# Patient Record
Sex: Male | Born: 1950 | Race: Black or African American | Hispanic: No | Smoking: Never smoker
Health system: Southern US, Community
[De-identification: ages and names within clinical notes are randomized; demographics above are authoritative.]

## PROBLEM LIST (undated history)

## (undated) DIAGNOSIS — I82403 Acute embolism and thrombosis of unspecified deep veins of lower extremity, bilateral: Secondary | ICD-10-CM

## (undated) DIAGNOSIS — E872 Acidosis, unspecified: Secondary | ICD-10-CM

## (undated) DIAGNOSIS — J9600 Acute respiratory failure, unspecified whether with hypoxia or hypercapnia: Secondary | ICD-10-CM

## (undated) DIAGNOSIS — E870 Hyperosmolality and hypernatremia: Secondary | ICD-10-CM

## (undated) DIAGNOSIS — I739 Peripheral vascular disease, unspecified: Secondary | ICD-10-CM

## (undated) DIAGNOSIS — E785 Hyperlipidemia, unspecified: Secondary | ICD-10-CM

## (undated) DIAGNOSIS — I959 Hypotension, unspecified: Secondary | ICD-10-CM

## (undated) HISTORY — DX: Acidosis: E87.2

## (undated) HISTORY — DX: Hypotension, unspecified: I95.9

## (undated) HISTORY — DX: Acute embolism and thrombosis of unspecified deep veins of lower extremity, bilateral: I82.403

## (undated) HISTORY — DX: Acidosis, unspecified: E87.20

## (undated) HISTORY — DX: Hyperlipidemia, unspecified: E78.5

## (undated) HISTORY — DX: Hyperosmolality and hypernatremia: E87.0

## (undated) HISTORY — DX: Acute respiratory failure, unspecified whether with hypoxia or hypercapnia: J96.00

## (undated) HISTORY — DX: Peripheral vascular disease, unspecified: I73.9

---

## 2002-03-12 ENCOUNTER — Emergency Department (HOSPITAL_COMMUNITY): Admission: EM | Admit: 2002-03-12 | Discharge: 2002-03-12 | Payer: Self-pay | Admitting: Emergency Medicine

## 2002-03-14 ENCOUNTER — Encounter: Payer: Self-pay | Admitting: Emergency Medicine

## 2002-03-14 ENCOUNTER — Emergency Department (HOSPITAL_COMMUNITY): Admission: EM | Admit: 2002-03-14 | Discharge: 2002-03-14 | Payer: Self-pay | Admitting: Emergency Medicine

## 2015-07-08 ENCOUNTER — Emergency Department (HOSPITAL_COMMUNITY): Payer: Self-pay

## 2015-07-08 ENCOUNTER — Emergency Department (HOSPITAL_COMMUNITY)
Admission: EM | Admit: 2015-07-08 | Discharge: 2015-07-08 | Disposition: A | Discharge status: Home / Self Care | Payer: Self-pay | Attending: Emergency Medicine | Admitting: Emergency Medicine

## 2015-07-08 ENCOUNTER — Encounter (HOSPITAL_COMMUNITY): Payer: Self-pay | Admitting: Neurology

## 2015-07-08 DIAGNOSIS — R000 Tachycardia, unspecified: Secondary | ICD-10-CM | POA: Insufficient documentation

## 2015-07-08 DIAGNOSIS — Z23 Encounter for immunization: Secondary | ICD-10-CM | POA: Insufficient documentation

## 2015-07-08 DIAGNOSIS — Y9389 Activity, other specified: Secondary | ICD-10-CM | POA: Insufficient documentation

## 2015-07-08 DIAGNOSIS — Y9289 Other specified places as the place of occurrence of the external cause: Secondary | ICD-10-CM | POA: Insufficient documentation

## 2015-07-08 DIAGNOSIS — Y998 Other external cause status: Secondary | ICD-10-CM | POA: Insufficient documentation

## 2015-07-08 DIAGNOSIS — I10 Essential (primary) hypertension: Secondary | ICD-10-CM | POA: Insufficient documentation

## 2015-07-08 DIAGNOSIS — Z043 Encounter for examination and observation following other accident: Secondary | ICD-10-CM | POA: Insufficient documentation

## 2015-07-08 DIAGNOSIS — W010XXA Fall on same level from slipping, tripping and stumbling without subsequent striking against object, initial encounter: Secondary | ICD-10-CM | POA: Insufficient documentation

## 2015-07-08 DIAGNOSIS — W19XXXA Unspecified fall, initial encounter: Secondary | ICD-10-CM

## 2015-07-08 MED ORDER — TETANUS-DIPHTH-ACELL PERTUSSIS 5-2.5-18.5 LF-MCG/0.5 IM SUSP
0.5000 mL | Freq: Once | INTRAMUSCULAR | Status: AC
  Administered 2015-07-08: 0.5 mL via INTRAMUSCULAR
  Filled 2015-07-08: qty 0.5

## 2015-07-08 MED ORDER — SODIUM CHLORIDE 0.9 % IV BOLUS (SEPSIS)
1000.0000 mL | Freq: Once | INTRAVENOUS | Status: AC
  Administered 2015-07-08: 1000 mL via INTRAVENOUS

## 2015-07-08 NOTE — ED Notes (Signed)
Per ems- [place] his [place] and fell hitting his head, no LOC. EMS assisted pt up, but he was unable to walk. Initial BP 148/114, pt is a x 4. Denies any pain. Recent BP 180/120, CBG 94, HR 113.

## 2015-07-08 NOTE — ED Notes (Signed)
No sign of adverse reaction to vaccination, pt dc'd

## 2015-07-08 NOTE — ED Provider Notes (Signed)
CSN: [id number]     Arrival date & time 07/08/15  0902 History   First MD Initiated Contact with Patient 07/08/15 [phone]     Chief Complaint  Patient presents with  . Fall  . Hypertension     (Consider location/radiation/quality/duration/timing/severity/associated sxs/prior Treatment) Patient is a 65 y.o. male presenting with fall and hypertension. The history is provided by the patient.  Fall This is a new problem. The current episode started 1 to 2 hours ago. The problem occurs rarely. The problem has not changed since onset.Pertinent negatives include no chest pain, no abdominal pain, no headaches and no shortness of breath. Nothing aggravates the symptoms. Nothing relieves the symptoms. He has tried nothing for the symptoms. The treatment provided no relief.  Hypertension Pertinent negatives include no chest pain, no abdominal pain, no headaches and no shortness of breath.   65 yo M With a chief complaint of a fall. Patient states he was at work and lost his balance and tripped over his feet and landed on his head. Denies loss of consciousness denies neck pain chest pain abdominal pain. Patient is currently asymptomatic. Denies chest pain or shortness breath prior to the event denies headache prior to the event. Last tetanus is unknown.  History reviewed. No pertinent past medical history. History reviewed. No pertinent past surgical history. No family history on file. Social History  Substance Use Topics  . Smoking status: Never Smoker   . Smokeless tobacco: None  . Alcohol Use: No    Review of Systems  Constitutional: Negative for fever and chills.  HENT: Negative for congestion and facial swelling.   Eyes: Negative for discharge and visual disturbance.  Respiratory: Negative for shortness of breath.   Cardiovascular: Negative for chest pain and palpitations.  Gastrointestinal: Negative for vomiting, abdominal pain and diarrhea.  Musculoskeletal: Negative for myalgias and  arthralgias.  Skin: Positive for wound. Negative for color change and rash.  Neurological: Negative for tremors, syncope and headaches.  Psychiatric/Behavioral: Negative for confusion and dysphoric mood.      Allergies  Review of patient's allergies indicates no known allergies.  Home Medications   Prior to Admission medications   Not on File   BP 169/90 mmHg  Pulse 89  Temp(Src) 99.6 F (37.6 C) (Oral)  Resp 20  SpO2 96% Physical Exam  Constitutional: He is oriented to person, place, and time. He appears well-developed and well-nourished.  HENT:  Head: Normocephalic and atraumatic.  Eyes: EOM are normal. Pupils are equal, round, and reactive to light.  Neck: Normal range of motion. Neck supple. No JVD present.  Cardiovascular: Regular rhythm.  Tachycardia present.  Exam reveals no gallop and no friction rub.   No murmur heard. Pulmonary/Chest: No respiratory distress. He has no wheezes.  Abdominal: He exhibits no distension. There is no tenderness. There is no rebound and no guarding.  Musculoskeletal: Normal range of motion.  Neurological: He is alert and oriented to person, place, and time.  Skin: No rash noted. No pallor.  Psychiatric: He has a normal mood and affect. His behavior is normal.  Nursing note and vitals reviewed.   ED Course  Procedures (including critical care time) Labs Review Labs Reviewed - No data to display  Imaging Review Dg Chest 2 View  07/08/2015  CLINICAL DATA:  Tachypnea for 1 day EXAM: CHEST  2 VIEW COMPARISON:  None. FINDINGS: The lungs are clear. Heart size and pulmonary vascularity are normal. No adenopathy. No bone lesions. IMPRESSION: No edema or consolidation.  Electronically Signed   By: Bretta Bang III M.D.   On: 07/08/2015 10:12   Ct Head Wo Contrast  07/08/2015  CLINICAL DATA:  Status post fall. No headache or headache complaints. No neck pain. EXAM: CT HEAD WITHOUT CONTRAST CT CERVICAL SPINE WITHOUT CONTRAST TECHNIQUE:  Multidetector CT imaging of the head and cervical spine was performed following the standard protocol without intravenous contrast. Multiplanar CT image reconstructions of the cervical spine were also generated. COMPARISON:  None. FINDINGS: CT HEAD FINDINGS There is no evidence of mass effect, midline shift or extra-axial fluid collections. There is no evidence of a space-occupying lesion or intracranial hemorrhage. There is no evidence of a cortical-based area of acute infarction. The ventricles and sulci are appropriate for the patient's age. The basal cisterns are patent. Visualized portions of the orbits are unremarkable. The visualized portions of the paranasal sinuses and mastoid air cells are unremarkable. The osseous structures are unremarkable. CT CERVICAL SPINE FINDINGS The alignment is anatomic. The vertebral body heights are maintained. There is no acute fracture. There is no static listhesis. The prevertebral soft tissues are normal. The intraspinal soft tissues are not fully imaged on this examination due to poor soft tissue contrast, but there is no gross soft tissue abnormality. There is severe disc disease at C4-5 and C5-6. There is osseous fusion across the C2-3 disc space and posterior elements. There is a broad-based disc osteophyte complex at C4-5 with bilateral uncovertebral degenerative changes and bilateral foraminal stenosis. 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 There is bilateral uncovertebral degenerative change at C5-6 with foraminal narrowing. There is osseous fusion across the C6-7 disc space. There is uncovertebral degenerative change at C6-7 with bilateral foraminal narrowing. There is severe bilateral facet arthropathy at C7-T1. There is bilateral foraminal narrowing at C7-T1. The visualized portions of the lung apices demonstrate no focal abnormality. IMPRESSION: 1. No acute intracranial abnormality. 2. No acute  osseous injury of the cervical spine. Electronically Signed   By: Elige Ko   On: 07/08/2015 10:44   Ct Cervical Spine Wo Contrast  07/08/2015  CLINICAL DATA:  Status post fall. No headache or headache complaints. No neck pain. EXAM: CT HEAD WITHOUT CONTRAST CT CERVICAL SPINE WITHOUT CONTRAST TECHNIQUE: Multidetector CT imaging of the head and cervical spine was performed following the standard protocol without intravenous contrast. Multiplanar CT image reconstructions of the cervical spine were also generated. COMPARISON:  None. FINDINGS: CT HEAD FINDINGS There is no evidence of mass effect, midline shift or extra-axial fluid collections. There is no evidence of a space-occupying lesion or intracranial hemorrhage. There is no evidence of a cortical-based area of acute infarction. The ventricles and sulci are appropriate for the patient's age. The basal cisterns are patent. Visualized portions of the orbits are unremarkable. The visualized portions of the paranasal sinuses and mastoid air cells are unremarkable. The osseous structures are unremarkable. CT CERVICAL SPINE FINDINGS The alignment is anatomic. The vertebral body heights are maintained. There is no acute fracture. There is no static listhesis. The prevertebral soft tissues are normal. The intraspinal soft tissues are not fully imaged on this examination due to poor soft tissue contrast, but there is no gross soft tissue abnormality. There is severe disc disease at C4-5 and C5-6. There is osseous fusion across the C2-3 disc space and posterior elements. There is a broad-based disc osteophyte complex at C4-5 with bilateral uncovertebral degenerative changes and bilateral foraminal stenosis. There is ossification of  the posterior longitudinal ligament at C5-6 resulting in central canal stenosis. There is bilateral uncovertebral degenerative change at C5-6 with foraminal narrowing. There is osseous fusion across the C6-7 disc space. There is  uncovertebral degenerative change at C6-7 with bilateral foraminal narrowing. There is severe bilateral facet arthropathy at C7-T1. There is bilateral foraminal narrowing at C7-T1. The visualized portions of the lung apices demonstrate no focal abnormality. IMPRESSION: 1. No acute intracranial abnormality. 2. No acute osseous injury of the cervical spine. Electronically Signed   By: Elige KoHetal  Patel   On: 07/08/2015 10:44   I have personally reviewed and evaluated these images and lab results as part of my medical decision-making.   EKG Interpretation   Date/Time:  Friday July 08 2015 09:05:06 EDT Ventricular Rate:  103 PR Interval:  155 QRS Duration: 102 QT Interval:  354 QTC Calculation: 463 R Axis:   41 Text Interpretation:  Sinus tachycardia RSR' in V1 or V2, right VCD or RVH  No old tracing to compare Confirmed by Kell Ferris MD, DANIEL [phone]([id number]) on  07/08/2015 9:27:10 AM      MDM   Final diagnoses:  Fall, initial encounter    65 yo M with a cc of a fall.  Mechanical.  ECG without wpw, brugada or prolonged qt.  Patient asymptomatic, ct head negative, tdap updated.  Initially tachycardic and tachypnic, patient felt a bit anxious.  Improved during his stay with minimal fluids, feel no further workup needed.   2:18 PM:  I have discussed the diagnosis/risks/treatment options with the patient and believe the pt to be eligible for discharge home to follow-up with PCP. We also discussed returning to the ED immediately if new or worsening sx occur. We discussed the sx which are most concerning (e.g., sudden worsening pain, fever, inability to tolerate by mouth) that necessitate immediate return. Medications administered to the patient during their visit and any new prescriptions provided to the patient are listed below.  Medications given during this visit Medications  sodium chloride 0.9 % bolus 1,000 mL (0 mLs Intravenous Stopped 07/08/15 1123)  Tdap (BOOSTRIX) injection 0.5 mL (0.5 mLs  Intramuscular Given 07/08/15 1123)    There are no discharge medications for this patient.   The patient appears reasonably screen and/or stabilized for discharge and I doubt any other medical condition or other [facility] requiring further screening, evaluation, or treatment in the ED at this time prior to discharge.      Melene Planan Maysen Bonsignore, DO 07/08/15 1418

## 2017-03-25 ENCOUNTER — Encounter (HOSPITAL_COMMUNITY): Payer: Self-pay

## 2017-03-25 ENCOUNTER — Other Ambulatory Visit: Payer: Self-pay

## 2017-03-25 ENCOUNTER — Inpatient Hospital Stay (HOSPITAL_COMMUNITY): Payer: Medicare Other

## 2017-03-25 ENCOUNTER — Observation Stay (HOSPITAL_COMMUNITY)
Admission: EM | Admit: 2017-03-25 | Discharge: 2017-03-26 | Disposition: A | Discharge status: Home / Self Care | Payer: Medicare Other | Attending: Internal Medicine | Admitting: Internal Medicine

## 2017-03-25 DIAGNOSIS — D72829 Elevated white blood cell count, unspecified: Secondary | ICD-10-CM | POA: Insufficient documentation

## 2017-03-25 DIAGNOSIS — N179 Acute kidney failure, unspecified: Principal | ICD-10-CM | POA: Diagnosis present

## 2017-03-25 DIAGNOSIS — N139 Obstructive and reflux uropathy, unspecified: Secondary | ICD-10-CM

## 2017-03-25 DIAGNOSIS — N401 Enlarged prostate with lower urinary tract symptoms: Secondary | ICD-10-CM

## 2017-03-25 DIAGNOSIS — K5900 Constipation, unspecified: Secondary | ICD-10-CM | POA: Insufficient documentation

## 2017-03-25 DIAGNOSIS — R338 Other retention of urine: Secondary | ICD-10-CM

## 2017-03-25 LAB — URINALYSIS, ROUTINE W REFLEX MICROSCOPIC
BILIRUBIN URINE: NEGATIVE
GLUCOSE, UA: NEGATIVE mg/dL
KETONES UR: 5 mg/dL — AB
LEUKOCYTES UA: NEGATIVE
NITRITE: NEGATIVE
PH: 5 (ref 5.0–8.0)
Protein, ur: 100 mg/dL — AB
Specific Gravity, Urine: 1.013 (ref 1.005–1.030)

## 2017-03-25 LAB — COMPREHENSIVE METABOLIC PANEL
ALT: 20 U/L (ref 17–63)
ANION GAP: 13 (ref 5–15)
AST: 32 U/L (ref 15–41)
Albumin: 4.1 g/dL (ref 3.5–5.0)
Alkaline Phosphatase: 73 U/L (ref 38–126)
BILIRUBIN TOTAL: 1.2 mg/dL (ref 0.3–1.2)
BUN: 38 mg/dL — ABNORMAL HIGH (ref 6–20)
CALCIUM: 9.2 mg/dL (ref 8.9–10.3)
CO2: 22 mmol/L (ref 22–32)
Chloride: 103 mmol/L (ref 101–111)
Creatinine, Ser: 3.45 mg/dL — ABNORMAL HIGH (ref 0.61–1.24)
GFR calc non Af Amer: 17 mL/min — ABNORMAL LOW (ref >60)
GFR, EST AFRICAN AMERICAN: 20 mL/min — AB (ref >60)
Glucose, Bld: 123 mg/dL — ABNORMAL HIGH (ref 65–99)
POTASSIUM: 3.7 mmol/L (ref 3.5–5.1)
SODIUM: 138 mmol/L (ref 135–145)
TOTAL PROTEIN: 7.8 g/dL (ref 6.5–8.1)

## 2017-03-25 LAB — CBC
HEMATOCRIT: 42.2 % (ref 39.0–52.0)
HEMOGLOBIN: 14.8 g/dL (ref 13.0–17.0)
MCH: 30.5 pg (ref 26.0–34.0)
MCHC: 35.1 g/dL (ref 30.0–36.0)
MCV: 87 fL (ref 78.0–100.0)
Platelets: 219 10*3/uL (ref 150–400)
RBC: 4.85 MIL/uL (ref 4.22–5.81)
RDW: 12.7 % (ref 11.5–15.5)
WBC: 15.7 10*3/uL — ABNORMAL HIGH (ref 4.0–10.5)

## 2017-03-25 LAB — LIPASE, BLOOD: Lipase: 22 U/L (ref 11–51)

## 2017-03-25 MED ORDER — POLYETHYLENE GLYCOL 3350 17 G PO PACK
17.0000 g | PACK | Freq: Every day | ORAL | Status: DC
  Administered 2017-03-25: 17 g via ORAL
  Filled 2017-03-25 (×2): qty 1

## 2017-03-25 MED ORDER — SODIUM CHLORIDE 0.9 % IV BOLUS (SEPSIS)
1000.0000 mL | Freq: Once | INTRAVENOUS | Status: AC
  Administered 2017-03-25: 1000 mL via INTRAVENOUS

## 2017-03-25 MED ORDER — ASPIRIN 81 MG PO CHEW: 81.0000 mg | CHEWABLE_TABLET | Freq: Every day | ORAL | Status: DC | PRN

## 2017-03-25 MED ORDER — SODIUM CHLORIDE 0.9 % IV SOLN
INTRAVENOUS | Status: DC
  Administered 2017-03-25 – 2017-03-26 (×2): via INTRAVENOUS

## 2017-03-25 MED ORDER — ONDANSETRON 4 MG PO TBDP
4.0000 mg | ORAL_TABLET | Freq: Once | ORAL | Status: AC | PRN
  Administered 2017-03-25: 4 mg via ORAL
  Filled 2017-03-25: qty 1

## 2017-03-25 NOTE — ED Triage Notes (Signed)
Per EMS- Patient has been c/o lower abdominal pain and vomiting x 4 days. Patient has not had a BM in 3 days.

## 2017-03-25 NOTE — ED Notes (Signed)
Patient given sprite.

## 2017-03-25 NOTE — ED Notes (Signed)
Provided patient a [place]key sandwich and a coke.

## 2017-03-25 NOTE — ED Triage Notes (Signed)
Patient also added that he has been voiding small amounts frequently x 3 days.

## 2017-03-25 NOTE — ED Notes (Signed)
Bed: [facility]2 Expected date:  Expected time:  Means of arrival:  Comments: 

## 2017-03-25 NOTE — ED Notes (Signed)
Attempted to call report, nurse is getting shift change report at this time.

## 2017-03-25 NOTE — ED Notes (Signed)
US at bedside

## 2017-03-25 NOTE — H&P (Addendum)
History and Physical    Joseph FloridaLarry E Bouchillon ZOX:[id number] DOB: 09/15/1950 DOA: 03/25/2017  Referring MD/NP/PA: Dr. Rhunette CroftNanavati  PCP: Patient, No Pcp Per   Patient coming from: home  Chief Complaint: abdominal pain, vomiting, decrease in urination  HPI: Joseph Downs is a 67 y.o. male who is relatively healthy at baseline, takes no medications, presents to emergency department with main concern of sudden onset of lower quadrants abdominal pain, mostly throbbing and dull, 5/10 in severity, non radiating, associated with nausea and poor oral intake, and several episodes of non bloody vomiting. Patient also reports being constipated and last bowel movement about 3 days prior to this admission. Patient notices that he was not urinating as much and when he urinated only small amounts came out. Patient denies fevers and chills, no similar events in the past, no sick contacts or exposures, no changes in medication regimen. Patient reports he takes no supplements over-the-counter.  ED Course: in emergency department, patient is hemodynamically stable, vital signs stable, blood work notable for WBC 15.7, creatinine 3.45. Bladder scan notable for over thousand cc in bladder, Foley catheter placed, patient reported immediate improvement in symptoms. [facility] asked to admit for further evaluation  Review of Systems:  Constitutional: Negative for fever, chills, diaphoresis, activity change, appetite change and fatigue.  HENT: Negative for ear pain, nosebleeds, congestion, facial swelling, rhinorrhea, neck pain, neck stiffness and ear discharge.   Eyes: Negative for pain, discharge, redness, itching and visual disturbance.  Respiratory: Negative for cough, choking, chest tightness, shortness of breath, wheezing and stridor.   Cardiovascular: Negative for chest pain, palpitations and leg swelling.  Gastrointestinal: Negative for abdominal distention.  Genitourinary: Negative for dysuria, hematuria, flank  pain Musculoskeletal: Negative for back pain, joint swelling, arthralgias and gait problem.  Neurological: Negative for dizziness, tremors, seizures, syncope, facial asymmetry, speech difficulty, weakness, light-headedness, numbness and headaches.  Hematological: Negative for adenopathy. Does not bruise/bleed easily.  Psychiatric/Behavioral: Negative for hallucinations, behavioral problems, confusion, dysphoric mood, decreased concentration and agitation.   Past medical history: No hypertension, no heart disease  Social history:  reports that  has never smoked. he has never used smokeless tobacco. He reports that he does not drink alcohol or use drugs.  No Known Allergies  Family History  Problem Relation Age of Onset  . Cancer Father     Prior to Admission medications   Medication Sig Start Date End Date Taking? Authorizing Provider  aspirin 81 MG chewable tablet Chew 81 mg by mouth daily as needed for mild pain.   Yes [provider]    Physical Exam: Vitals:   03/25/17 0733 03/25/17 0736 03/25/17 0737  BP: (!) 122/92    Pulse: (!) 134 67   Resp: 18    Temp: 97.6 F (36.4 C)    TempSrc: Oral    SpO2: 100%    Weight:   81.6 kg (180 lb)  Height:  5\' 7"  (1.702 m)     Constitutional: NAD, calm, comfortable Vitals:   03/25/17 0733 03/25/17 0736 03/25/17 0737  BP: (!) 122/92    Pulse: (!) 134 67   Resp: 18    Temp: 97.6 F (36.4 C)    TempSrc: Oral    SpO2: 100%    Weight:   81.6 kg (180 lb)  Height:  5\' 7"  (1.702 m)    Eyes: PERRL, lids and conjunctivae normal ENMT: Mucous membranes are moist. Posterior pharynx clear of any exudate or lesions.Normal dentition.  Neck: normal, supple, no masses, no  thyromegaly Respiratory: clear to auscultation bilaterally, no wheezing, no crackles. Normal respiratory effort. No accessory muscle use.  Cardiovascular: Regular rate and rhythm, no murmurs / rubs / gallops. No extremity edema. 2+ pedal pulses. No carotid bruits.   Abdomen: no tenderness, no masses palpated. No hepatosplenomegaly. Bowel sounds positive.  Musculoskeletal: no clubbing / cyanosis. No joint deformity upper and lower extremities. Good ROM, no contractures. Normal muscle tone.  Skin: no rashes, lesions, ulcers. No induration Neurologic: CN 2-12 grossly intact. Sensation intact, DTR normal. Strength 5/5 in all 4.  Psychiatric: Normal judgment and insight. Alert and oriented x 3. Normal mood.    Labs on Admission: I have personally reviewed following labs and imaging studies  CBC: Recent Labs  Lab 03/25/17 0740  WBC 15.7*  HGB 14.8  HCT 42.2  MCV 87.0  PLT 219   Basic Metabolic Panel: Recent Labs  Lab 03/25/17 0740  NA 138  K 3.7  CL 103  CO2 22  GLUCOSE 123*  BUN 38*  CREATININE 3.45*  CALCIUM 9.2   Liver Function Tests: Recent Labs  Lab 03/25/17 0740  AST 32  ALT 20  ALKPHOS 73  BILITOT 1.2  PROT 7.8  ALBUMIN 4.1   Recent Labs  Lab 03/25/17 0740  LIPASE 22    Radiological Exams on Admission: No results found.  EKG: not done   Assessment/Plan  Active Problems: Acute kidney injury - Suspect post renal, obstructive - Unclear etiology of obstruction, question constipation versus BPH - Agree with admission for further evaluation - Keep Foley catheter in place - Continue IV fluids - Follow-up on renal ultrasound - BMP in the morning  Leukocytosis - Suspect this is reactive process - Urinalysis pending - Will repeat CBC in the morning  Constipation - Could certainly contribute to acute urinary retention - Placed on MiraLAX and monitor   DVT prophylaxis: SCDs, early ambulation Code Status: full code Family Communication: Pt and wife updated at bedside Disposition Plan: patient will likely go home once medically stable Consults called: none Admission status: inpatient  Debbora Presto MD [facility] Pager [phone]  If 7PM-7AM, please contact  night-coverage [email or web address] Password [email or web address]  03/25/2017, 10:30 AM

## 2017-03-25 NOTE — ED Notes (Signed)
Bed: WA03 Expected date:  Expected time:  Means of arrival:  Comments: 

## 2017-03-25 NOTE — ED Notes (Signed)
Attempted IV placement-will pursue US IV placement

## 2017-03-25 NOTE — ED Notes (Signed)
Gave report to [place], Charity fundraiserN for room 1325.

## 2017-03-26 DIAGNOSIS — R338 Other retention of urine: Secondary | ICD-10-CM

## 2017-03-26 DIAGNOSIS — N179 Acute kidney failure, unspecified: Principal | ICD-10-CM

## 2017-03-26 DIAGNOSIS — N401 Enlarged prostate with lower urinary tract symptoms: Secondary | ICD-10-CM

## 2017-03-26 LAB — BASIC METABOLIC PANEL
Anion gap: 5 (ref 5–15)
BUN: 20 mg/dL (ref 6–20)
CALCIUM: 8 mg/dL — AB (ref 8.9–10.3)
CO2: 24 mmol/L (ref 22–32)
CREATININE: 1.45 mg/dL — AB (ref 0.61–1.24)
Chloride: 112 mmol/L — ABNORMAL HIGH (ref 101–111)
GFR calc non Af Amer: 49 mL/min — ABNORMAL LOW (ref >60)
GFR, EST AFRICAN AMERICAN: 56 mL/min — AB (ref >60)
GLUCOSE: 104 mg/dL — AB (ref 65–99)
Potassium: 3.7 mmol/L (ref 3.5–5.1)
Sodium: 141 mmol/L (ref 135–145)

## 2017-03-26 LAB — URINE CULTURE: CULTURE: NO GROWTH

## 2017-03-26 MED ORDER — TAMSULOSIN HCL 0.4 MG PO CAPS: 0.4000 mg | ORAL_CAPSULE | Freq: Every day | ORAL | 1 refills | Status: AC

## 2017-03-26 MED ORDER — TAMSULOSIN HCL 0.4 MG PO CAPS: 0.4000 mg | ORAL_CAPSULE | Freq: Every day | ORAL | Status: DC

## 2017-03-26 NOTE — Care Management CC44 (Signed)
Condition Code 44 Documentation Completed  Patient Details  Name: Melonie FloridaLarry E Carlo MRN: [id number] Date of Birth: 1950-11-05   Condition Code 44 given:  Yes Patient signature on Condition Code 44 notice:  Yes Documentation of 2 MD's agreement:  Yes Code 44 added to claim:  Yes    Bartholome BillCLEMENTS, Haedyn Ancrum H, RN 03/26/2017, 11:06 AM

## 2017-03-26 NOTE — Care Management Obs Status (Signed)
MEDICARE OBSERVATION STATUS NOTIFICATION   Patient Details  Name: Joseph FloridaLarry E Rohner MRN: [id number] Date of Birth: 1950-05-08   Medicare Observation Status Notification Given:  Yes    Bartholome BillCLEMENTS, Geovany Trudo H, RN 03/26/2017, 11:06 AM

## 2017-03-26 NOTE — Discharge Summary (Signed)
Physician Discharge Summary  Joseph Downs WUJ:[id number] DOB: Mar 13, 1951 DOA: 03/25/2017  PCP: Patient, No Pcp Per  Admit date: 03/25/2017 Discharge date: 03/26/2017  Time spent: 35 minutes  Recommendations for Outpatient Follow-up:  1. Repeat CBC to follow WBC's trend  2. Repeat BMET to follow electrolytes and renal function    Discharge Diagnoses:    ARF (acute renal failure) (HCC)   Acute urinary retention   Benign prostatic hyperplasia with urinary retention   Leukocytosis    Discharge Condition: stable and improved. Discharge home with instructions to folow up with urology and PCP as an outpatient. Patient discharge with foley in place.   Diet recommendation: regular diet   Filed Weights   03/25/17 0737 03/25/17 2128  Weight: 81.6 kg (180 lb) 90.6 kg (199 lb 11.8 oz)    History of present illness:  As per H&P written by Dr. Izola Price on 03/25/17 67 y.o. male who is relatively healthy at baseline, takes no medications, presents to emergency department with main concern of sudden onset of lower quadrants abdominal pain, mostly throbbing and dull, 5/10 in severity, non radiating, associated with nausea and poor oral intake, and several episodes of non bloody vomiting. Patient also reports being constipated and last bowel movement about 3 days prior to this admission. Patient notices that he was not urinating as much and when he urinated only small amounts came out. Patient denies fevers and chills, no similar events in the past, no sick contacts or exposures, no changes in medication regimen. Patient reports he takes no supplements over-the-counter  Hospital Course:  1-acute urinary retention/BPH -resolved with foley placement -no hydronephrosis seen on renal US -case discussed with urology and patient started on flomax -discharge with foley in place and follow up in 1 week with urology service -no fever, no hematuria and no signs of infection  -advise to keep himself well hydrated    2-leukocytosis -most likely reactive -will recommend repeat CBC at follow up visit to follow WBC's trend   3-acute renal failure -in setting of post-obstructive uropathy; most likely due to BPH -renal function impressively improved after foley placement  -instructed to avoid nephrotoxic agents and to keep himself well hydrated     Procedures:  See below for x-ray reports   Consultations:  Urology (Dr. Alvester Morin): recommended to discharge patient on flomax and with foley catheter in place; they would arrange follow up with urology service in 1 week.  Discharge Exam: Vitals:   03/25/17 2128 03/26/17 0414  BP: 123/66 119/75  Pulse: 77 66  Resp: 18 16  Temp: 97.8 F (36.6 C) 99.2 F (37.3 C)  SpO2: 99% 97%    General: afebrile, no CP, no SOB, no nausea, no vomiting and no abd pain. Cardiovascular: S1 and S2, no rubs, no gallops, no murmur Respiratory: CTA bilaterally Abd: soft, NT, ND, positive BS GU: foley cathter in place, good urine output appreciated; no hematuria.  Extremities: no edema, no cyanosis, no clubbing   Discharge Instructions   Discharge Instructions    Discharge instructions   Complete by:  As directed    Maintain adequate hydration Avoid NSAID's  Take medications as prescribed  Follow up with urology service in 1 week (office will contact you with appointment details).   Increase activity slowly   Complete by:  As directed      Allergies as of 03/26/2017   No Known Allergies     Medication List    STOP taking these medications   aspirin 81  MG chewable tablet     TAKE these medications   tamsulosin 0.4 MG Caps capsule Commonly known as:  FLOMAX Take 1 capsule (0.4 mg total) by mouth daily after supper.      No Known Allergies Follow-up Information    Ray ChurchBell, Eugene D III, MD Follow up in 1 week(s).   Specialty:  Urology Why:  office will contact you with appointment details  Contact information: [address] [place]  [phone]           The results of significant diagnostics from this hospitalization (including imaging, microbiology, ancillary and laboratory) are listed below for reference.    Significant Diagnostic Studies: [place] Renal  Result Date: 03/25/2017 CLINICAL DATA:  Renal failure, inability to void for the past 4 days. EXAM: RENAL / URINARY TRACT ULTRASOUND COMPLETE COMPARISON:  None in PACs FINDINGS: Right Kidney: Length: 12.2 cm. The renal cortical echotexture is increased and is similar to or slightly greater than that of the liver. There is a cyst in the mid upper pole cortex measuring 1.7 cm in greatest dimension. There is no hydronephrosis. Left Kidney: Length: 12 cm. The renal cortical echotexture is mildly increased similar to that on the right. There is no hydronephrosis. Bladder: The urinary bladder is empty.  A Foley catheter is present. IMPRESSION: Increased renal cortical echotexture compatible with medical renal disease. There is no hydronephrosis. Electronically Signed   By: David  Swaziland[place] M.D.   On: 03/25/2017 12:17   Labs: Basic Metabolic Panel: Recent Labs  Lab 03/25/17 0740 03/26/17 0349  NA 138 141  K 3.7 3.7  CL 103 112*  CO2 22 24  GLUCOSE 123* 104*  BUN 38* 20  CREATININE 3.45* 1.45*  CALCIUM 9.2 8.0*   Liver Function Tests: Recent Labs  Lab 03/25/17 0740  AST 32  ALT 20  ALKPHOS 73  BILITOT 1.2  PROT 7.8  ALBUMIN 4.1   Recent Labs  Lab 03/25/17 0740  LIPASE 22   CBC: Recent Labs  Lab 03/25/17 0740  WBC 15.7*  HGB 14.8  HCT 42.2  MCV 87.0  PLT 219    Signed:  Vassie Lollarlos Exie Chrismer MD.  [facility] 03/26/2017, 10:24 AM

## 2017-03-26 NOTE — Discharge Instructions (Signed)
Acute Urinary Retention, Male °Acute urinary retention is when you are unable to pee (urinate). Acute urinary retention is common in older men. Prostates can get bigger, which blocks the flow of pee. °Follow these instructions at home: °· Drink enough fluids to keep your pee clear or pale yellow. °· If you are sent home with a tube that drains the bladder (catheter), there will be a drainage bag attached to it. There are two types of bags. One is big that you can wear at night without having to empty it. One is smaller and needs to be emptied more often. °? Keep the drainage bag empty. °? Keep the drainage bag lower than your catheter. °· Only take medicine as told by your doctor. °Contact a doctor if: °· You have a low-grade fever. °· You have spasms or you are leaking pee when you have spasms. °Get help right away if: °· You have chills or a fever. °· Your catheter stops draining pee. °· Your catheter falls out. °· You have increased bleeding that does not stop after you have rested and increased the amount of fluids you had been drinking. °This information is not intended to replace advice given to you by your health care provider. Make sure you discuss any questions you have with your health care provider. °Document Released: 08/22/2007 Document Revised: 08/11/2015 Document Reviewed: 08/14/2012 °Elsevier Interactive Patient Education © 2017 Elsevier Inc[employer] ° ° °Indwelling Urinary Catheter Insertion, Care After °This sheet gives you information about how to care for yourself after your procedure. Your health care provider may also give you more specific instructions. If you have problems or questions, contact your health care provider. °What can I expect after the procedure? °After the procedure, it is common to have: °· Slight discomfort around your urethra where the catheter enters your body. ° °Follow these instructions at home: °· Keep the drainage bag at or below the level of your bladder. Doing this ensures  that urine can only drain out, not back into your body. °· Secure the catheter tubing and drainage bag to your leg or thigh to keep it from moving. °· Check the catheter tubing regularly to make sure there are no kinks or blockages. °· Take showers daily to keep the catheter clean. Do not take a bath. °· Do not pull on your catheter or try to remove it. °· Disconnect the tubing and drainage bag as little as possible. °· Empty the drainage bag every 2-4 hours, or more often if needed. Do not let the bag get completely full. °· Wash your hands with soap and water before and after touching the catheter, tubing, or drainage bag. °· Do not let the drainage bag or catheter tubing touch the floor. °· Drink enough fluids to keep your urine clear or pale yellow, or as told by your health care provider. °Contact a health care provider if: °· Urine stops flowing into the drainage bag. °· You feel pain or pressure in the bladder area. °· You have back pain. °· Your catheter gets clogged. °· Your catheter starts to leak. °· Your urine looks cloudy. °· Your drainage bag or tubing looks dirty. °· You notice a bad smell when emptying your drainage bag. °Get help right away if: °· You have a fever or chills. °· You have severe pain in your back or your lower abdomen. °· You have warmth, redness, swelling, or pain in the urethra area. °· You notice blood in your urine. °· Your catheter gets   pulled out. °Summary °· Do not pull on your catheter or try to remove it. °· Keep the drainage bag at or below the level of your bladder, but do not let the drainage bag or catheter tubing touch the floor. °· Wash your hands with soap and water before and after touching the catheter, tubing, or drainage bag. °· Contact your health care provider if you have a fever, chills, or any other signs of infection. °This information is not intended to replace advice given to you by your health care provider. Make sure you discuss any questions you have with  your health care provider. °Document Released: 04/14/2016 Document Revised: 04/14/2016 Document Reviewed: 04/14/2016 °Elsevier Interactive Patient Education © 2018 Elsevier Inc. ° °

## 2017-03-26 NOTE — Progress Notes (Signed)
Went over indwelling catheter care, discharge instructions, and AVS.  Patient left hospital with all belongings, hard script and bus pass.

## 2017-03-28 NOTE — ED Provider Notes (Signed)
[facility] Provider Note   CSN: [id number] Arrival date & time: 03/25/17  0703     History   Chief Complaint Chief Complaint  Patient presents with  . Abdominal Pain    HPI Joseph Downs is a 67 y.o. male.  HPI 67 y.o. male who is relatively healthy at baseline, takes no medications, presents to emergency department with lower quadrants abdominal pain, mostly throbbing and dull, 5/10 in severity, non radiating, associated with nausea and poor oral intake, and several episodes of non bloody vomiting. Patient also reports being constipated and last bowel movement about 3 days prior to this admission. Patient notices that he was not urinating as much and when he urinated only small amounts came out. Patient denies fevers and chills, no similar events in the past, no sick contacts or exposures, no changes in medication regimen. Patient reports he takes no supplements over-the-counter. Pt has no hx of prostate problems or cancer.   History reviewed. No pertinent past medical history.  Patient Active Problem List   Diagnosis Date Noted  . Acute urinary retention   . AKI (acute kidney injury) (HCC)   . Benign prostatic hyperplasia with urinary retention   . ARF (acute renal failure) (HCC) 03/25/2017    History reviewed. No pertinent surgical history.     Home Medications    Prior to Admission medications   Medication Sig Start Date End Date Taking? Authorizing Provider  tamsulosin (FLOMAX) 0.4 MG CAPS capsule Take 1 capsule (0.4 mg total) by mouth daily after supper. 03/26/17   Vassie Loll, MD    Family History Family History  Problem Relation Age of Onset  . Cancer Father     Social History Social History   Tobacco Use  . Smoking status: Never Smoker  . Smokeless tobacco: Never Used  Substance Use Topics  . Alcohol use: No  . Drug use: No     Allergies   Patient has no known allergies.   Review of Systems Review of  Systems  All other systems reviewed and are negative.    Physical Exam Updated Vital Signs BP 120/62 (BP Location: Left Arm)   Pulse 74   Temp 98.4 F (36.9 C) (Oral)   Resp 16   Ht 5\' 10"  (1.778 m)   Wt 90.6 kg (199 lb 11.8 oz)   SpO2 96%   BMI 28.66 kg/m   Physical Exam  Constitutional: He is oriented to person, place, and time. He appears well-developed.  HENT:  Head: Normocephalic and atraumatic.  Eyes: Conjunctivae and EOM are normal. Pupils are equal, round, and reactive to light.  Neck: Normal range of motion. Neck supple.  Cardiovascular: Normal rate and regular rhythm.  Pulmonary/Chest: Effort normal and breath sounds normal.  Abdominal: Soft. Bowel sounds are normal. He exhibits no distension and no mass. There is tenderness. There is no rebound and no guarding.  Musculoskeletal: He exhibits no deformity.  Neurological: He is alert and oriented to person, place, and time.  Skin: Skin is warm.  Nursing note and vitals reviewed.    ED Treatments / Results  Labs (all labs ordered are listed, but only abnormal results are displayed) Labs Reviewed  COMPREHENSIVE METABOLIC PANEL - Abnormal; Notable for the following components:      Result Value   Glucose, Bld 123 (*)    BUN 38 (*)    Creatinine, Ser 3.45 (*)    GFR calc non Af Amer 17 (*)  GFR calc Af Amer 20 (*)    All other components within normal limits  CBC - Abnormal; Notable for the following components:   WBC 15.7 (*)    All other components within normal limits  URINALYSIS, ROUTINE W REFLEX MICROSCOPIC - Abnormal; Notable for the following components:   APPearance HAZY (*)    Hgb urine dipstick LARGE (*)    Ketones, ur 5 (*)    Protein, ur 100 (*)    Bacteria, UA FEW (*)    Squamous Epithelial / LPF 0-5 (*)    All other components within normal limits  BASIC METABOLIC PANEL - Abnormal; Notable for the following components:   Chloride 112 (*)    Glucose, Bld 104 (*)    Creatinine, Ser 1.45  (*)    Calcium 8.0 (*)    GFR calc non Af Amer 49 (*)    GFR calc Af Amer 56 (*)    All other components within normal limits  URINE CULTURE  LIPASE, BLOOD    EKG  EKG Interpretation None       Radiology No results found.  Procedures Procedures (including critical care time)  Medications Ordered in ED Medications  ondansetron (ZOFRAN-ODT) disintegrating tablet 4 mg (4 mg Oral Given 03/25/17 0743)  sodium chloride 0.9 % bolus 1,000 mL (0 mLs Intravenous Stopped 03/25/17 1253)  sodium chloride 0.9 % bolus 1,000 mL (0 mLs Intravenous Stopped 03/25/17 1253)     Initial Impression / Assessment and Plan / ED Course  I have reviewed the triage vital signs and the nursing notes.  Pertinent labs & imaging results that were available during my care of the patient were reviewed by me and considered in my medical decision making (see chart for details).     Pt comes in with cc of abd pain. Pt has firm and distended abdomen and he has poor urinary output. Pt is noted to have close to 1000 cc of bladder scan, foley catheter placed to decompress the bladder. Pt noted to have AKI, likely post obstructive uropathy is the etiology. COuld be tumor/mass. We will get US renal, admit for further workup.  Final Clinical Impressions(s) / ED Diagnoses   Final diagnoses:  AKI (acute kidney injury) (HCC)  Acute bilateral obstructive uropathy  Acute urinary retention    ED Discharge Orders        Ordered    tamsulosin (FLOMAX) 0.4 MG CAPS capsule  Daily after supper     03/26/17 1023    Increase activity slowly     03/26/17 1023    Discharge instructions    Comments:  Maintain adequate hydration Avoid NSAID's  Take medications as prescribed  Follow up with urology service in 1 week (office will contact you with appointment details).   03/26/17 1023       Derwood KaplanNanavati, Raphaela Cannaday, MD 03/28/17 [phone]

## 2017-04-08 ENCOUNTER — Encounter (HOSPITAL_COMMUNITY): Payer: Self-pay

## 2017-04-08 ENCOUNTER — Emergency Department (HOSPITAL_COMMUNITY)
Admission: EM | Admit: 2017-04-08 | Discharge: 2017-04-08 | Disposition: A | Discharge status: Home / Self Care | Payer: Medicare Other | Attending: Emergency Medicine | Admitting: Emergency Medicine

## 2017-04-08 ENCOUNTER — Other Ambulatory Visit: Payer: Self-pay

## 2017-04-08 DIAGNOSIS — Y828 Other medical devices associated with adverse incidents: Secondary | ICD-10-CM | POA: Insufficient documentation

## 2017-04-08 DIAGNOSIS — T83031A Leakage of indwelling urethral catheter, initial encounter: Secondary | ICD-10-CM | POA: Insufficient documentation

## 2017-04-08 DIAGNOSIS — R339 Retention of urine, unspecified: Secondary | ICD-10-CM | POA: Insufficient documentation

## 2017-04-08 DIAGNOSIS — Z960 Presence of urogenital implants: Secondary | ICD-10-CM | POA: Insufficient documentation

## 2017-04-08 DIAGNOSIS — N368 Other specified disorders of urethra: Secondary | ICD-10-CM | POA: Insufficient documentation

## 2017-04-08 DIAGNOSIS — R319 Hematuria, unspecified: Secondary | ICD-10-CM | POA: Insufficient documentation

## 2017-04-08 LAB — COMPREHENSIVE METABOLIC PANEL
ALBUMIN: 3.2 g/dL — AB (ref 3.5–5.0)
ALT: 22 U/L (ref 17–63)
AST: 27 U/L (ref 15–41)
Alkaline Phosphatase: 74 U/L (ref 38–126)
Anion gap: 11 (ref 5–15)
BUN: 11 mg/dL (ref 6–20)
CHLORIDE: 106 mmol/L (ref 101–111)
CO2: 22 mmol/L (ref 22–32)
CREATININE: 1.21 mg/dL (ref 0.61–1.24)
Calcium: 8.7 mg/dL — ABNORMAL LOW (ref 8.9–10.3)
GFR calc Af Amer: >60 mL/min (ref >60)
GFR calc non Af Amer: >60 mL/min (ref >60)
GLUCOSE: 90 mg/dL (ref 65–99)
POTASSIUM: 4.3 mmol/L (ref 3.5–5.1)
Sodium: 139 mmol/L (ref 135–145)
Total Bilirubin: 0.5 mg/dL (ref 0.3–1.2)
Total Protein: 6.6 g/dL (ref 6.5–8.1)

## 2017-04-08 LAB — URINALYSIS, ROUTINE W REFLEX MICROSCOPIC
Bilirubin Urine: NEGATIVE
Glucose, UA: NEGATIVE mg/dL
Ketones, ur: NEGATIVE mg/dL
Nitrite: NEGATIVE
Protein, ur: 30 mg/dL — AB
SPECIFIC GRAVITY, URINE: 1.018 (ref 1.005–1.030)
Squamous Epithelial / LPF: NONE SEEN
pH: 6 (ref 5.0–8.0)

## 2017-04-08 LAB — CBC WITH DIFFERENTIAL/PLATELET
Basophils Absolute: 0 10*3/uL (ref 0.0–0.1)
Basophils Relative: 0 %
EOS PCT: 4 %
Eosinophils Absolute: 0.3 10*3/uL (ref 0.0–0.7)
HCT: 39.5 % (ref 39.0–52.0)
Hemoglobin: 13.2 g/dL (ref 13.0–17.0)
LYMPHS ABS: 1.2 10*3/uL (ref 0.7–4.0)
LYMPHS PCT: 17 %
MCH: 30.2 pg (ref 26.0–34.0)
MCHC: 33.4 g/dL (ref 30.0–36.0)
MCV: 90.4 fL (ref 78.0–100.0)
MONO ABS: 0.3 10*3/uL (ref 0.1–1.0)
MONOS PCT: 5 %
Neutro Abs: 5.3 10*3/uL (ref 1.7–7.7)
Neutrophils Relative %: 74 %
PLATELETS: 293 10*3/uL (ref 150–400)
RBC: 4.37 MIL/uL (ref 4.22–5.81)
RDW: 12.5 % (ref 11.5–15.5)
WBC: 7.1 10*3/uL (ref 4.0–10.5)

## 2017-04-08 LAB — I-STAT CG4 LACTIC ACID, ED: LACTIC ACID, VENOUS: 1.44 mmol/L (ref 0.5–1.9)

## 2017-04-08 MED ORDER — NEOMYCIN-POLYMYXIN-PRAMOXINE 1 % EX CREA
TOPICAL_CREAM | Freq: Two times a day (BID) | CUTANEOUS | Status: DC
  Filled 2017-04-08: qty 28

## 2017-04-08 MED ORDER — BACITRACIN-NEOMYCIN-POLYMYXIN OINTMENT TUBE
TOPICAL_OINTMENT | Freq: Once | CUTANEOUS | Status: AC
  Administered 2017-04-08: 19:00:00 via TOPICAL
  Filled 2017-04-08: qty 14.17

## 2017-04-08 NOTE — ED Provider Notes (Signed)
[facility] Provider Note   CSN: [id number] Arrival date & time: 04/08/17  1148     History   Chief Complaint Chief Complaint  Patient presents with  . Hematuria    HPI Joseph Downs is a 67 y.o. male.  HPI   Joseph Downs is a 67 y.o. male, with a history of urinary retention, Foley catheter, and prostatic hyperplasia, presenting to the ED with pain associated with foley catheter.   Pain "feels like fire" in the penis for the last week.  Rates it 9/10, nonradiating.  Associated with intermittent hematuria and chills.  Also endorses bloody and puslike drainage from around the catheter.  States the catheter is still draining urine.  Patient had a foley catheter placed on Jan 7 due to urinary retention with abdominal discomfort. Was told to come back in a few days, however, his father passed away and he was unable to return until today.   Denies abdominal pain, fever, N/V/D, dizziness, back/flank pain, pain with bowel movements, testicular pain, scrotal swelling, or any other complaints.     History reviewed. No pertinent past medical history.  Patient Active Problem List   Diagnosis Date Noted  . Acute urinary retention   . AKI (acute kidney injury) (HCC)   . Benign prostatic hyperplasia with urinary retention   . ARF (acute renal failure) (HCC) 03/25/2017    History reviewed. No pertinent surgical history.     Home Medications    Prior to Admission medications   Medication Sig Start Date End Date Taking? Authorizing Provider  tamsulosin (FLOMAX) 0.4 MG CAPS capsule Take 1 capsule (0.4 mg total) by mouth daily after supper. 03/26/17   Vassie Loll, MD    Family History Family History  Problem Relation Age of Onset  . Cancer Father     Social History Social History   Tobacco Use  . Smoking status: Never Smoker  . Smokeless tobacco: Never Used  Substance Use Topics  . Alcohol use: No  . Drug use: No      Allergies   Patient has no known allergies.   Review of Systems Review of Systems  Constitutional: Positive for chills. Negative for diaphoresis and fever.  Respiratory: Negative for shortness of breath.   Cardiovascular: Negative for chest pain.  Gastrointestinal: Negative for abdominal pain, nausea and vomiting.  Genitourinary: Positive for discharge, hematuria and penile pain. Negative for decreased urine volume, dysuria, flank pain and testicular pain.  Musculoskeletal: Negative for back pain.  All other systems reviewed and are negative.    Physical Exam Updated Vital Signs BP (!) 140/53   Pulse 65   Temp 99 F (37.2 C) (Oral)   Resp 16   Ht 5\' 10"  (1.778 m)   Wt 81.6 kg (180 lb)   SpO2 100%   BMI 25.83 kg/m   Physical Exam  Constitutional: He appears well-developed and well-nourished. No distress.  HENT:  Head: Normocephalic and atraumatic.  Eyes: Conjunctivae are normal.  Neck: Neck supple.  Cardiovascular: Normal rate, regular rhythm, normal heart sounds and intact distal pulses.  Pulmonary/Chest: Effort normal and breath sounds normal. No respiratory distress.  Abdominal: Soft. He exhibits no distension. There is no tenderness. There is no guarding.  Genitourinary: Discharge found.  Genitourinary Comments: Foley catheter in place.  Copious amounts of leakage from around the catheter including blood-tinged purulent appearing crusting around the penile meatus, and the patient's parents, and in the suprapubic region.  Penis, scrotum, and testicles  without swelling, lesions, or tenderness. No inguinal lymphadenopathy. Otherwise normal male genitalia. PA student, Lequita HaltMorgan, served as chaperone during the exam.  Musculoskeletal: He exhibits no edema.  Lymphadenopathy:    He has no cervical adenopathy.  Neurological: He is alert.  Skin: Skin is warm and dry. He is not diaphoretic.  Psychiatric: He has a normal mood and affect. His behavior is normal.  Nursing  note and vitals reviewed.    ED Treatments / Results  Labs (all labs ordered are listed, but only abnormal results are displayed) Labs Reviewed  COMPREHENSIVE METABOLIC PANEL - Abnormal; Notable for the following components:      Result Value   Calcium 8.7 (*)    Albumin 3.2 (*)    All other components within normal limits  URINALYSIS, ROUTINE W REFLEX MICROSCOPIC - Abnormal; Notable for the following components:   APPearance HAZY (*)    Hgb urine dipstick MODERATE (*)    Protein, ur 30 (*)    Leukocytes, UA SMALL (*)    Bacteria, UA RARE (*)    All other components within normal limits  URINE CULTURE  CBC WITH DIFFERENTIAL/PLATELET  I-STAT CG4 LACTIC ACID, ED    EKG  EKG Interpretation None       Radiology No results found.  Procedures Procedures (including critical care time)  Medications Ordered in ED Medications  neomycin-bacitracin-polymyxin (NEOSPORIN) ointment (not administered)     Initial Impression / Assessment and Plan / ED Course  I have reviewed the triage vital signs and the nursing notes.  Pertinent labs & imaging results that were available during my care of the patient were reviewed by me and considered in my medical decision making (see chart for details).  Clinical Course as of Apr 08 1809  Mon Apr 08, 2017  1746 Spoke with Dr. Annabell HowellsWrenn, urology. Suspects patient's symptoms are due to urethral irritation from the catheter.  Recommends leaving catheter in place due to risk of being unable to feed a new catheter in.  Apply Neosporin plus to the area to reduce discomfort.  States he does not think systemic antibiotics are necessary at this time.  He would like to see the patient in the office as soon as possible this week.  [SJ]    Clinical Course User Index [SJ] Phillip Sandler C, PA-C    Patient presents with discomfort surrounding his Foley catheter. No abdominal discomfort, distention, or tenderness.  Creatinine appears to have improved from  values on January 7 and 8, with no current signs of AKI. Follow up with urology in the office. The patient was given instructions for home care as well as return precautions. Patient voices understanding of these instructions, accepts the plan, and is comfortable with discharge.  Findings and plan of care discussed with Bethann BerkshireJoseph Zammit, MD.   Final Clinical Impressions(s) / ED Diagnoses   Final diagnoses:  Urethral irritation    ED Discharge Orders    None       Concepcion LivingJoy, Prabhjot Maddux C, PA-C 04/08/17 1811    Bethann BerkshireZammit, Joseph, MD 04/08/17 2350

## 2017-04-08 NOTE — Discharge Instructions (Addendum)
Please leave the catheter in place.  Apply the Neosporin plus cream twice daily to the area around the catheter and the head of the penis.  Please follow-up with the urologist as soon as possible this week.  Call the number provided to set up an appointment. Return to the ED for decreased urine output, fever, abdominal pain or distension, or any other major concerns.

## 2017-04-08 NOTE — ED Triage Notes (Signed)
Pt. Had a catheter placed urinary retention on 03/25/2017.  Pt. Was to do a follow-up appointment but his father passed and he traveled to [place] and was unable to come back due to weather.  He is here today due to severe pain around catheter insertion site and intermittent hematuria.  .  Pt. Is also having intermittent chills.  Pt. Denies any n/v.  Pt. Also reports that it is draining urine.

## 2017-04-09 LAB — URINE CULTURE

## 2017-05-01 ENCOUNTER — Other Ambulatory Visit: Payer: Self-pay

## 2017-05-01 ENCOUNTER — Inpatient Hospital Stay (HOSPITAL_COMMUNITY)
Admission: EM | Admit: 2017-05-01 | Discharge: 2017-06-13 | DRG: 003 | Disposition: A | Discharge status: Skilled Nursing Facility | Payer: Medicare Other | Attending: Internal Medicine | Admitting: Internal Medicine

## 2017-05-01 ENCOUNTER — Inpatient Hospital Stay (HOSPITAL_COMMUNITY): Payer: Medicare Other

## 2017-05-01 ENCOUNTER — Emergency Department (HOSPITAL_COMMUNITY): Payer: Medicare Other

## 2017-05-01 DIAGNOSIS — I214 Non-ST elevation (NSTEMI) myocardial infarction: Secondary | ICD-10-CM

## 2017-05-01 DIAGNOSIS — E873 Alkalosis: Secondary | ICD-10-CM | POA: Diagnosis present

## 2017-05-01 DIAGNOSIS — E440 Moderate protein-calorie malnutrition: Secondary | ICD-10-CM | POA: Diagnosis present

## 2017-05-01 DIAGNOSIS — A419 Sepsis, unspecified organism: Secondary | ICD-10-CM

## 2017-05-01 DIAGNOSIS — A4152 Sepsis due to Pseudomonas: Principal | ICD-10-CM | POA: Diagnosis present

## 2017-05-01 DIAGNOSIS — J9601 Acute respiratory failure with hypoxia: Secondary | ICD-10-CM

## 2017-05-01 DIAGNOSIS — K7200 Acute and subacute hepatic failure without coma: Secondary | ICD-10-CM | POA: Diagnosis not present

## 2017-05-01 DIAGNOSIS — R570 Cardiogenic shock: Secondary | ICD-10-CM | POA: Diagnosis not present

## 2017-05-01 DIAGNOSIS — E876 Hypokalemia: Secondary | ICD-10-CM | POA: Diagnosis not present

## 2017-05-01 DIAGNOSIS — Z452 Encounter for adjustment and management of vascular access device: Secondary | ICD-10-CM

## 2017-05-01 DIAGNOSIS — I82412 Acute embolism and thrombosis of left femoral vein: Secondary | ICD-10-CM | POA: Diagnosis present

## 2017-05-01 DIAGNOSIS — E871 Hypo-osmolality and hyponatremia: Secondary | ICD-10-CM | POA: Diagnosis not present

## 2017-05-01 DIAGNOSIS — N179 Acute kidney failure, unspecified: Secondary | ICD-10-CM | POA: Diagnosis not present

## 2017-05-01 DIAGNOSIS — D696 Thrombocytopenia, unspecified: Secondary | ICD-10-CM | POA: Diagnosis present

## 2017-05-01 DIAGNOSIS — I824Z2 Acute embolism and thrombosis of unspecified deep veins of left distal lower extremity: Secondary | ICD-10-CM | POA: Diagnosis present

## 2017-05-01 DIAGNOSIS — G92 Toxic encephalopathy: Secondary | ICD-10-CM | POA: Diagnosis not present

## 2017-05-01 DIAGNOSIS — E872 Acidosis, unspecified: Secondary | ICD-10-CM | POA: Diagnosis present

## 2017-05-01 DIAGNOSIS — X398XXA Other exposure to forces of nature, initial encounter: Secondary | ICD-10-CM

## 2017-05-01 DIAGNOSIS — T34822A Frostbite with tissue necrosis of left foot, initial encounter: Secondary | ICD-10-CM | POA: Diagnosis present

## 2017-05-01 DIAGNOSIS — J9600 Acute respiratory failure, unspecified whether with hypoxia or hypercapnia: Secondary | ICD-10-CM

## 2017-05-01 DIAGNOSIS — Z89612 Acquired absence of left leg above knee: Secondary | ICD-10-CM

## 2017-05-01 DIAGNOSIS — I824Y3 Acute embolism and thrombosis of unspecified deep veins of proximal lower extremity, bilateral: Secondary | ICD-10-CM

## 2017-05-01 DIAGNOSIS — I2609 Other pulmonary embolism with acute cor pulmonale: Secondary | ICD-10-CM | POA: Diagnosis not present

## 2017-05-01 DIAGNOSIS — L7632 Postprocedural hematoma of skin and subcutaneous tissue following other procedure: Secondary | ICD-10-CM | POA: Diagnosis not present

## 2017-05-01 DIAGNOSIS — J189 Pneumonia, unspecified organism: Secondary | ICD-10-CM | POA: Diagnosis not present

## 2017-05-01 DIAGNOSIS — I82403 Acute embolism and thrombosis of unspecified deep veins of lower extremity, bilateral: Secondary | ICD-10-CM | POA: Diagnosis not present

## 2017-05-01 DIAGNOSIS — R0902 Hypoxemia: Secondary | ICD-10-CM | POA: Diagnosis not present

## 2017-05-01 DIAGNOSIS — E870 Hyperosmolality and hypernatremia: Secondary | ICD-10-CM | POA: Diagnosis not present

## 2017-05-01 DIAGNOSIS — E875 Hyperkalemia: Secondary | ICD-10-CM | POA: Diagnosis not present

## 2017-05-01 DIAGNOSIS — I82409 Acute embolism and thrombosis of unspecified deep veins of unspecified lower extremity: Secondary | ICD-10-CM

## 2017-05-01 DIAGNOSIS — I82441 Acute embolism and thrombosis of right tibial vein: Secondary | ICD-10-CM | POA: Diagnosis present

## 2017-05-01 DIAGNOSIS — G8918 Other acute postprocedural pain: Secondary | ICD-10-CM

## 2017-05-01 DIAGNOSIS — T34821A Frostbite with tissue necrosis of right foot, initial encounter: Secondary | ICD-10-CM | POA: Diagnosis not present

## 2017-05-01 DIAGNOSIS — Z9911 Dependence on respirator [ventilator] status: Secondary | ICD-10-CM

## 2017-05-01 DIAGNOSIS — I82432 Acute embolism and thrombosis of left popliteal vein: Secondary | ICD-10-CM | POA: Diagnosis present

## 2017-05-01 DIAGNOSIS — I82491 Acute embolism and thrombosis of other specified deep vein of right lower extremity: Secondary | ICD-10-CM | POA: Diagnosis present

## 2017-05-01 DIAGNOSIS — Y835 Amputation of limb(s) as the cause of abnormal reaction of the patient, or of later complication, without mention of misadventure at the time of the procedure: Secondary | ICD-10-CM | POA: Diagnosis not present

## 2017-05-01 DIAGNOSIS — Z79899 Other long term (current) drug therapy: Secondary | ICD-10-CM

## 2017-05-01 DIAGNOSIS — I213 ST elevation (STEMI) myocardial infarction of unspecified site: Secondary | ICD-10-CM | POA: Diagnosis not present

## 2017-05-01 DIAGNOSIS — R6521 Severe sepsis with septic shock: Secondary | ICD-10-CM | POA: Diagnosis not present

## 2017-05-01 DIAGNOSIS — D6489 Other specified anemias: Secondary | ICD-10-CM | POA: Diagnosis present

## 2017-05-01 DIAGNOSIS — N3001 Acute cystitis with hematuria: Secondary | ICD-10-CM | POA: Diagnosis present

## 2017-05-01 DIAGNOSIS — Z590 Homelessness: Secondary | ICD-10-CM

## 2017-05-01 DIAGNOSIS — D62 Acute posthemorrhagic anemia: Secondary | ICD-10-CM | POA: Diagnosis not present

## 2017-05-01 DIAGNOSIS — X31XXXA Exposure to excessive natural cold, initial encounter: Secondary | ICD-10-CM

## 2017-05-01 DIAGNOSIS — I8290 Acute embolism and thrombosis of unspecified vein: Secondary | ICD-10-CM

## 2017-05-01 DIAGNOSIS — J9602 Acute respiratory failure with hypercapnia: Secondary | ICD-10-CM | POA: Diagnosis not present

## 2017-05-01 DIAGNOSIS — Z930 Tracheostomy status: Secondary | ICD-10-CM

## 2017-05-01 DIAGNOSIS — J440 Chronic obstructive pulmonary disease with acute lower respiratory infection: Secondary | ICD-10-CM | POA: Diagnosis present

## 2017-05-01 DIAGNOSIS — Z4659 Encounter for fitting and adjustment of other gastrointestinal appliance and device: Secondary | ICD-10-CM

## 2017-05-01 DIAGNOSIS — J80 Acute respiratory distress syndrome: Secondary | ICD-10-CM | POA: Diagnosis not present

## 2017-05-01 DIAGNOSIS — Z430 Encounter for attention to tracheostomy: Secondary | ICD-10-CM

## 2017-05-01 DIAGNOSIS — E861 Hypovolemia: Secondary | ICD-10-CM | POA: Diagnosis present

## 2017-05-01 DIAGNOSIS — R6510 Systemic inflammatory response syndrome (SIRS) of non-infectious origin without acute organ dysfunction: Secondary | ICD-10-CM

## 2017-05-01 DIAGNOSIS — N139 Obstructive and reflux uropathy, unspecified: Secondary | ICD-10-CM | POA: Diagnosis present

## 2017-05-01 DIAGNOSIS — Z6825 Body mass index (BMI) 25.0-25.9, adult: Secondary | ICD-10-CM

## 2017-05-01 DIAGNOSIS — I959 Hypotension, unspecified: Secondary | ICD-10-CM

## 2017-05-01 DIAGNOSIS — I70263 Atherosclerosis of native arteries of extremities with gangrene, bilateral legs: Secondary | ICD-10-CM | POA: Diagnosis not present

## 2017-05-01 DIAGNOSIS — J9621 Acute and chronic respiratory failure with hypoxia: Secondary | ICD-10-CM | POA: Diagnosis not present

## 2017-05-01 DIAGNOSIS — Z89611 Acquired absence of right leg above knee: Secondary | ICD-10-CM

## 2017-05-01 DIAGNOSIS — J95850 Mechanical complication of respirator: Secondary | ICD-10-CM

## 2017-05-01 DIAGNOSIS — R739 Hyperglycemia, unspecified: Secondary | ICD-10-CM | POA: Diagnosis present

## 2017-05-01 DIAGNOSIS — I2699 Other pulmonary embolism without acute cor pulmonale: Secondary | ICD-10-CM

## 2017-05-01 DIAGNOSIS — Z9289 Personal history of other medical treatment: Secondary | ICD-10-CM

## 2017-05-01 DIAGNOSIS — M6282 Rhabdomyolysis: Secondary | ICD-10-CM | POA: Diagnosis present

## 2017-05-01 DIAGNOSIS — N183 Chronic kidney disease, stage 3 (moderate): Secondary | ICD-10-CM | POA: Diagnosis present

## 2017-05-01 DIAGNOSIS — G9340 Encephalopathy, unspecified: Secondary | ICD-10-CM

## 2017-05-01 DIAGNOSIS — L8990 Pressure ulcer of unspecified site, unspecified stage: Secondary | ICD-10-CM

## 2017-05-01 DIAGNOSIS — Z978 Presence of other specified devices: Secondary | ICD-10-CM

## 2017-05-01 DIAGNOSIS — T68XXXA Hypothermia, initial encounter: Secondary | ICD-10-CM | POA: Diagnosis present

## 2017-05-01 LAB — URINALYSIS, ROUTINE W REFLEX MICROSCOPIC
BACTERIA UA: NONE SEEN
Bilirubin Urine: NEGATIVE
GLUCOSE, UA: 50 mg/dL — AB
KETONES UR: NEGATIVE mg/dL
Nitrite: NEGATIVE
PROTEIN: 100 mg/dL — AB
SQUAMOUS EPITHELIAL / LPF: NONE SEEN
Specific Gravity, Urine: 1.014 (ref 1.005–1.030)
pH: 5 (ref 5.0–8.0)

## 2017-05-01 LAB — CBC WITH DIFFERENTIAL/PLATELET
BASOS PCT: 0 %
Basophils Absolute: 0 10*3/uL (ref 0.0–0.1)
EOS PCT: 0 %
Eosinophils Absolute: 0 10*3/uL (ref 0.0–0.7)
HEMATOCRIT: 41.3 % (ref 39.0–52.0)
HEMOGLOBIN: 13.1 g/dL (ref 13.0–17.0)
LYMPHS PCT: 11 %
Lymphs Abs: 1.7 10*3/uL (ref 0.7–4.0)
MCH: 29.2 pg (ref 26.0–34.0)
MCHC: 31.7 g/dL (ref 30.0–36.0)
MCV: 92 fL (ref 78.0–100.0)
MONOS PCT: 6 %
Monocytes Absolute: 0.9 10*3/uL (ref 0.1–1.0)
NEUTROS ABS: 13 10*3/uL — AB (ref 1.7–7.7)
NEUTROS PCT: 83 %
Platelets: 181 10*3/uL (ref 150–400)
RBC: 4.49 MIL/uL (ref 4.22–5.81)
RDW: 13.9 % (ref 11.5–15.5)
WBC: 15.6 10*3/uL — ABNORMAL HIGH (ref 4.0–10.5)

## 2017-05-01 LAB — I-STAT CG4 LACTIC ACID, ED
LACTIC ACID, VENOUS: 6.17 mmol/L — AB (ref 0.5–1.9)
Lactic Acid, Venous: 7.85 mmol/L (ref 0.5–1.9)

## 2017-05-01 LAB — COMPREHENSIVE METABOLIC PANEL
ALBUMIN: 2.3 g/dL — AB (ref 3.5–5.0)
ALK PHOS: 75 U/L (ref 38–126)
ALT: 83 U/L — ABNORMAL HIGH (ref 17–63)
ANION GAP: 21 — AB (ref 5–15)
AST: 81 U/L — ABNORMAL HIGH (ref 15–41)
BUN: 58 mg/dL — ABNORMAL HIGH (ref 6–20)
CHLORIDE: 110 mmol/L (ref 101–111)
CO2: 14 mmol/L — ABNORMAL LOW (ref 22–32)
Calcium: 8.7 mg/dL — ABNORMAL LOW (ref 8.9–10.3)
Creatinine, Ser: 2.5 mg/dL — ABNORMAL HIGH (ref 0.61–1.24)
GFR calc non Af Amer: 25 mL/min — ABNORMAL LOW (ref >60)
GFR, EST AFRICAN AMERICAN: 29 mL/min — AB (ref >60)
GLUCOSE: 218 mg/dL — AB (ref 65–99)
Potassium: 4.1 mmol/L (ref 3.5–5.1)
SODIUM: 145 mmol/L (ref 135–145)
Total Bilirubin: 0.9 mg/dL (ref 0.3–1.2)
Total Protein: 7.5 g/dL (ref 6.5–8.1)

## 2017-05-01 LAB — I-STAT ARTERIAL BLOOD GAS, ED
Acid-base deficit: 11 mmol/L — ABNORMAL HIGH (ref 0.0–2.0)
Bicarbonate: 13.1 mmol/L — ABNORMAL LOW (ref 20.0–28.0)
O2 Saturation: 88 %
PCO2 ART: 23.1 mmHg — AB (ref 32.0–48.0)
PH ART: 7.353 (ref 7.350–7.450)
TCO2: 14 mmol/L — ABNORMAL LOW (ref 22–32)
pO2, Arterial: 51 mmHg — ABNORMAL LOW (ref 83.0–108.0)

## 2017-05-01 LAB — RAPID URINE DRUG SCREEN, HOSP PERFORMED
AMPHETAMINES: NOT DETECTED
BENZODIAZEPINES: NOT DETECTED
Barbiturates: NOT DETECTED
Cocaine: NOT DETECTED
OPIATES: NOT DETECTED
TETRAHYDROCANNABINOL: NOT DETECTED

## 2017-05-01 LAB — GLUCOSE, CAPILLARY
GLUCOSE-CAPILLARY: 160 mg/dL — AB (ref 65–99)
Glucose-Capillary: 159 mg/dL — ABNORMAL HIGH (ref 65–99)

## 2017-05-01 LAB — CBC
HCT: 36.9 % — ABNORMAL LOW (ref 39.0–52.0)
Hemoglobin: 11.8 g/dL — ABNORMAL LOW (ref 13.0–17.0)
MCH: 28.7 pg (ref 26.0–34.0)
MCHC: 32 g/dL (ref 30.0–36.0)
MCV: 89.8 fL (ref 78.0–100.0)
PLATELETS: 96 10*3/uL — AB (ref 150–400)
RBC: 4.11 MIL/uL — ABNORMAL LOW (ref 4.22–5.81)
RDW: 13.7 % (ref 11.5–15.5)
WBC: 18.9 10*3/uL — ABNORMAL HIGH (ref 4.0–10.5)

## 2017-05-01 LAB — MRSA PCR SCREENING: MRSA by PCR: NEGATIVE

## 2017-05-01 LAB — BRAIN NATRIURETIC PEPTIDE: B Natriuretic Peptide: 208.4 pg/mL — ABNORMAL HIGH (ref 0.0–100.0)

## 2017-05-01 LAB — POCT I-STAT 3, ART BLOOD GAS (G3+)
Acid-base deficit: 8 mmol/L — ABNORMAL HIGH (ref 0.0–2.0)
Bicarbonate: 14.6 mmol/L — ABNORMAL LOW (ref 20.0–28.0)
O2 Saturation: 91 %
PCO2 ART: 21.6 mmHg — AB (ref 32.0–48.0)
PH ART: 7.436 (ref 7.350–7.450)
Patient temperature: 97.8
TCO2: 15 mmol/L — ABNORMAL LOW (ref 22–32)
pO2, Arterial: 55 mmHg — ABNORMAL LOW (ref 83.0–108.0)

## 2017-05-01 LAB — CREATININE, SERUM
CREATININE: 3.35 mg/dL — AB (ref 0.61–1.24)
GFR calc Af Amer: 21 mL/min — ABNORMAL LOW (ref >60)
GFR calc non Af Amer: 18 mL/min — ABNORMAL LOW (ref >60)

## 2017-05-01 LAB — ETHANOL: Alcohol, Ethyl (B): <10 mg/dL (ref <10)

## 2017-05-01 LAB — D-DIMER, QUANTITATIVE: D-Dimer, Quant: >20 ug/mL-FEU — ABNORMAL HIGH (ref 0.00–0.50)

## 2017-05-01 LAB — CK: Total CK: 2933 U/L — ABNORMAL HIGH (ref 49–397)

## 2017-05-01 LAB — PROCALCITONIN: PROCALCITONIN: 14.36 ng/mL

## 2017-05-01 LAB — TROPONIN I: TROPONIN I: 2.83 ng/mL — AB (ref <0.03)

## 2017-05-01 LAB — HEMOGLOBIN A1C
Hgb A1c MFr Bld: 6.4 % — ABNORMAL HIGH (ref 4.8–5.6)
Mean Plasma Glucose: 136.98 mg/dL

## 2017-05-01 LAB — LACTIC ACID, PLASMA: Lactic Acid, Venous: 5.9 mmol/L (ref 0.5–1.9)

## 2017-05-01 MED ORDER — NOREPINEPHRINE BITARTRATE 1 MG/ML IV SOLN
0.0000 ug/min | INTRAVENOUS | Status: DC
  Administered 2017-05-01: 5 ug/min via INTRAVENOUS
  Administered 2017-05-02: 6 ug/min via INTRAVENOUS
  Administered 2017-05-02: 5 ug/min via INTRAVENOUS
  Administered 2017-05-02: 12 ug/min via INTRAVENOUS
  Administered 2017-05-04: 6 ug/min via INTRAVENOUS
  Administered 2017-05-04: 15 ug/min via INTRAVENOUS
  Administered 2017-05-04: 10 ug/min via INTRAVENOUS
  Filled 2017-05-01 (×8): qty 4

## 2017-05-01 MED ORDER — METOPROLOL TARTRATE 25 MG PO TABS
25.0000 mg | ORAL_TABLET | Freq: Two times a day (BID) | ORAL | Status: DC
  Filled 2017-05-01 (×2): qty 1

## 2017-05-01 MED ORDER — VANCOMYCIN HCL IN DEXTROSE 1-5 GM/200ML-% IV SOLN: 1000.0000 mg | Freq: Once | INTRAVENOUS | Status: DC

## 2017-05-01 MED ORDER — PIPERACILLIN-TAZOBACTAM 3.375 G IVPB
3.3750 g | Freq: Three times a day (TID) | INTRAVENOUS | Status: DC
  Administered 2017-05-01 – 2017-05-02 (×3): 3.375 g via INTRAVENOUS
  Filled 2017-05-01 (×4): qty 50

## 2017-05-01 MED ORDER — MIDAZOLAM HCL 2 MG/2ML IJ SOLN
1.0000 mg | INTRAMUSCULAR | Status: DC | PRN
  Administered 2017-05-02: 1 mg via INTRAVENOUS
  Filled 2017-05-01: qty 2

## 2017-05-01 MED ORDER — SODIUM CHLORIDE 0.9 % IV SOLN: INTRAVENOUS | Status: DC | PRN

## 2017-05-01 MED ORDER — FENTANYL BOLUS VIA INFUSION
25.0000 ug | INTRAVENOUS | Status: DC | PRN
  Administered 2017-05-02 – 2017-05-04 (×3): 25 ug via INTRAVENOUS
  Administered 2017-05-04: 100 ug via INTRAVENOUS
  Administered 2017-05-04 – 2017-05-15 (×20): 25 ug via INTRAVENOUS
  Filled 2017-05-01: qty 25

## 2017-05-01 MED ORDER — LACTATED RINGERS IV SOLN
INTRAVENOUS | Status: DC
  Administered 2017-05-01 – 2017-05-02 (×2): via INTRAVENOUS

## 2017-05-01 MED ORDER — MIDAZOLAM HCL 2 MG/2ML IJ SOLN
1.0000 mg | INTRAMUSCULAR | Status: DC | PRN
  Administered 2017-05-01 – 2017-05-02 (×2): 1 mg via INTRAVENOUS
  Filled 2017-05-01 (×2): qty 2

## 2017-05-01 MED ORDER — HEPARIN (PORCINE) IN NACL 100-0.45 UNIT/ML-% IJ SOLN
1600.0000 [IU]/h | INTRAMUSCULAR | Status: DC
  Administered 2017-05-02: 1100 [IU]/h via INTRAVENOUS
  Administered 2017-05-02: 1200 [IU]/h via INTRAVENOUS
  Administered 2017-05-05 – 2017-05-07 (×4): 1350 [IU]/h via INTRAVENOUS
  Administered 2017-05-08: 1400 [IU]/h via INTRAVENOUS
  Administered 2017-05-08: 1350 [IU]/h via INTRAVENOUS
  Administered 2017-05-09 – 2017-05-12 (×5): 1400 [IU]/h via INTRAVENOUS
  Administered 2017-05-13 – 2017-05-15 (×4): 1600 [IU]/h via INTRAVENOUS
  Filled 2017-05-01 (×32): qty 250

## 2017-05-01 MED ORDER — THIAMINE HCL 100 MG/ML IJ SOLN
100.0000 mg | Freq: Every day | INTRAMUSCULAR | Status: DC
  Administered 2017-05-02 – 2017-05-20 (×19): 100 mg via INTRAVENOUS
  Filled 2017-05-01 (×15): qty 1
  Filled 2017-05-01: qty 2
  Filled 2017-05-01 (×4): qty 1
  Filled 2017-05-01: qty 2

## 2017-05-01 MED ORDER — MIDAZOLAM HCL 2 MG/2ML IJ SOLN
INTRAMUSCULAR | Status: AC
  Administered 2017-05-01: 2 mg via INTRAVENOUS
  Filled 2017-05-01: qty 2

## 2017-05-01 MED ORDER — PANTOPRAZOLE SODIUM 40 MG IV SOLR
40.0000 mg | Freq: Every day | INTRAVENOUS | Status: DC
Start: 2017-05-01 — End: 2017-05-19
  Administered 2017-05-02 – 2017-05-18 (×18): 40 mg via INTRAVENOUS
  Filled 2017-05-01 (×21): qty 40

## 2017-05-01 MED ORDER — INSULIN ASPART 100 UNIT/ML ~~LOC~~ SOLN
0.0000 [IU] | Freq: Three times a day (TID) | SUBCUTANEOUS | Status: DC
Start: 2017-05-01 — End: 2017-05-04
  Administered 2017-05-02 – 2017-05-03 (×4): 1 [IU] via SUBCUTANEOUS
  Administered 2017-05-04: 2 [IU] via SUBCUTANEOUS

## 2017-05-01 MED ORDER — PIPERACILLIN-TAZOBACTAM 3.375 G IVPB 30 MIN
3.3750 g | Freq: Once | INTRAVENOUS | Status: AC
  Administered 2017-05-01: 3.375 g via INTRAVENOUS
  Filled 2017-05-01: qty 50

## 2017-05-01 MED ORDER — FENTANYL CITRATE (PF) 100 MCG/2ML IJ SOLN
50.0000 ug | Freq: Once | INTRAMUSCULAR | Status: AC
  Administered 2017-05-01: 50 ug via INTRAVENOUS

## 2017-05-01 MED ORDER — SODIUM CHLORIDE 0.9 % IV BOLUS (SEPSIS)
1000.0000 mL | Freq: Once | INTRAVENOUS | Status: AC
  Administered 2017-05-01: 1000 mL via INTRAVENOUS

## 2017-05-01 MED ORDER — IPRATROPIUM-ALBUTEROL 0.5-2.5 (3) MG/3ML IN SOLN
3.0000 mL | Freq: Four times a day (QID) | RESPIRATORY_TRACT | Status: DC
  Administered 2017-05-01 – 2017-05-23 (×89): 3 mL via RESPIRATORY_TRACT
  Filled 2017-05-01 (×89): qty 3

## 2017-05-01 MED ORDER — SODIUM CHLORIDE 0.9 % IV SOLN
INTRAVENOUS | Status: DC
Start: 2017-05-01 — End: 2017-05-01

## 2017-05-01 MED ORDER — FENTANYL CITRATE (PF) 100 MCG/2ML IJ SOLN
INTRAMUSCULAR | Status: AC
  Administered 2017-05-01: 100 ug
  Filled 2017-05-01: qty 2

## 2017-05-01 MED ORDER — FENTANYL 2500MCG IN NS 250ML (10MCG/ML) PREMIX INFUSION
0.0000 ug/h | INTRAVENOUS | Status: DC
  Administered 2017-05-01: 50 ug/h via INTRAVENOUS
  Administered 2017-05-01: 100 ug/h via INTRAVENOUS
  Administered 2017-05-02: 400 ug/h via INTRAVENOUS
  Administered 2017-05-04: 100 ug/h via INTRAVENOUS
  Administered 2017-05-04: 125 ug/h via INTRAVENOUS
  Administered 2017-05-05: 150 ug/h via INTRAVENOUS
  Administered 2017-05-06: 100 ug/h via INTRAVENOUS
  Administered 2017-05-06: 125 ug/h via INTRAVENOUS
  Administered 2017-05-07 – 2017-05-08 (×3): 100 ug/h via INTRAVENOUS
  Administered 2017-05-09 – 2017-05-10 (×2): 75 ug/h via INTRAVENOUS
  Administered 2017-05-11 – 2017-05-12 (×2): 100 ug/h via INTRAVENOUS
  Administered 2017-05-13 – 2017-05-16 (×2): 50 ug/h via INTRAVENOUS
  Filled 2017-05-01 (×19): qty 250

## 2017-05-01 MED ORDER — CHLORHEXIDINE GLUCONATE 0.12 % MT SOLN
15.0000 mL | Freq: Two times a day (BID) | OROMUCOSAL | Status: DC
  Administered 2017-05-01: 15 mL via OROMUCOSAL

## 2017-05-01 MED ORDER — VANCOMYCIN HCL IN DEXTROSE 1-5 GM/200ML-% IV SOLN
1000.0000 mg | INTRAVENOUS | Status: DC
  Filled 2017-05-01: qty 200

## 2017-05-01 MED ORDER — HEPARIN SODIUM (PORCINE) 5000 UNIT/ML IJ SOLN
5000.0000 [IU] | Freq: Three times a day (TID) | INTRAMUSCULAR | Status: DC
  Administered 2017-05-01: 5000 [IU] via SUBCUTANEOUS
  Filled 2017-05-01 (×2): qty 1

## 2017-05-01 MED ORDER — ASPIRIN 325 MG PO TABS
325.0000 mg | ORAL_TABLET | Freq: Every day | ORAL | Status: DC
  Filled 2017-05-01 (×2): qty 1

## 2017-05-01 MED ORDER — ORAL CARE MOUTH RINSE: 15.0000 mL | Freq: Two times a day (BID) | OROMUCOSAL | Status: DC

## 2017-05-01 MED ORDER — VANCOMYCIN HCL 10 G IV SOLR
1750.0000 mg | Freq: Once | INTRAVENOUS | Status: AC
  Administered 2017-05-01: 1750 mg via INTRAVENOUS
  Filled 2017-05-01: qty 1750

## 2017-05-01 MED ORDER — SODIUM CHLORIDE 0.9 % IV BOLUS (SEPSIS): 500.0000 mL | Freq: Once | INTRAVENOUS | Status: DC

## 2017-05-01 MED ORDER — DOCUSATE SODIUM 50 MG/5ML PO LIQD
100.0000 mg | Freq: Two times a day (BID) | ORAL | Status: DC | PRN
  Administered 2017-05-27: 100 mg
  Filled 2017-05-01: qty 10

## 2017-05-01 NOTE — ED Notes (Signed)
Radiology at bedside for CXR

## 2017-05-01 NOTE — Progress Notes (Signed)
eLink Physician-Brief Progress Note Patient Name: Joseph [facility] DOB: December 19, 1950 MRN: [id number]   Date of Service  05/01/2017  HPI/Events of Note  Call from bedside nurse reporting that patient is having resp distress.  RR in the high 30s to 40s.  Cannot speak in full sentences.  Mixed metabolic acidosis/resp alkalosis.  Lactate up.  Patient is alert and responsive on ventimask 45% with sats of 100% when they pick up.    eICU Interventions  Plan: Will attempt BiPAP Recheck ABG within a couple of hours Monitor for continued resp distress. PCCM to see at bedside.     Intervention Category Intermediate Interventions: Respiratory distress - evaluation and management  Lin Hackmann 05/01/2017, 8:33 PM

## 2017-05-01 NOTE — Progress Notes (Signed)
ANTICOAGULATION CONSULT NOTE - Initial Consult  Pharmacy Consult for Heparin  Indication: chest pain/ACS  No Known Allergies  Patient Measurements: Height: 5\' 10"  (177.8 cm) Weight: 184 lb 8.4 oz (83.7 kg) IBW/kg (Calculated) : 73  Vital Signs: Temp: 98 F (36.7 C) (02/13 2000) Temp Source: Axillary (02/13 2000) BP: 99/68 (02/13 2117) Pulse Rate: 110 (02/13 2040)  Labs: Recent Labs    05/01/17 1110 05/01/17 1954  HGB 13.1 11.8*  HCT 41.3 36.9*  PLT 181 PENDING  CREATININE 2.50* 3.35*  CKTOTAL  --  2,933*  TROPONINI  --  2.83*    Estimated Creatinine Clearance: 22.4 mL/min (A) (by C-G formula based on SCr of 3.35 mg/dL (H)).   Assessment: [name] in the ICU after being "found down". His troponin is elevated. Starting heparin per pharmacy. Hgb 11.8. Noted renal dysfunction. No anticoagulants PTA.   Goal of Therapy:  Heparin level 0.3-0.7 units/ml Monitor platelets by anticoagulation protocol: Yes   Plan:  Will avoid bolus with recent subcutaneous heparin  Start heparin drip at 1100 units/hr 0800 HL Daily CBC/HL Monitor for bleeding   Abran DukeLedford, Albion Weatherholtz 05/01/2017,10:54 PM

## 2017-05-01 NOTE — Progress Notes (Signed)
eLink Physician-Brief Progress Note Patient Name: Joseph FloridaLarry E [place]s DOB: 27-Jan-1951 MRN: [id number]   Date of Service  05/01/2017  HPI/Events of Note  Patient with reported h/o of syncope etiology unknown.  Initial trop of 2.83.  ECHO already ordered.  EKG being done.  eICU Interventions  Plan: ASA Heparin gtt per pharmacy Cycle trop Cards consult BB     Intervention Category Intermediate Interventions: Other:  DETERDING,ELIZABETH 05/01/2017, 10:05 PM

## 2017-05-01 NOTE — Consult Note (Signed)
  Consult Note    Joseph FloridaLarry E Ingerson WGN:[id number] DOB: 05-05-1950 DOA: 05/01/2017  PCP: Patient, No Pcp Per Patient coming from: Homeless  Chief Complaint: found down  HPI: Joseph FloridaLarry E Downs is a [age] y.o. male with unknown medical history presenting after he was found down.  I was asked to see the patient and based on the story provided, asked that the ER also obtain a PCCM consult.  At the time of my evaluation the patient had HR 135, RR 45-50, and appeared acutely ill.  As a result, I called the ER attending back to ask that PCCM admit the patient since he appeared to be suffering from septic shock in addition to recovering from hypothermia..  At that time, I was informed that PCCM would be admitting the patient.   ED Course: Homeless, found outside unresponsive.  Bair hugger, 3L IVF, slowly starting to improve.  Now very awake.  Mild tachycardia, but BPs now >100.  Lactate 7+.  Sepsis protocol, given broad spectrum abx and now thought to have urinary source.  He is doing tremendously better.  Dr. Deretha EmoryZackowski thinks he is appropriate for SDU.   I have asked that they consult PCCM to ensure that they agree that he is appropriate for SDU placement.  PMH, PSH, FH unable to be obtained Patient did inform me that he has been homeless since he and his wife divorced; and that he was last inside a building about 2 weeks ago.   Physical Exam: Vitals:   05/01/17 1615 05/01/17 1630 05/01/17 1653 05/01/17 1700  BP: 95/66 101/71  104/68  Pulse: (!) 137 (!) 135  (!) 134  Resp: (!) 46 (!) 42  (!) 40  Temp:   (!) 101.3 F (38.5 C)   TempSrc:   Rectal   SpO2: 95% 94%  95%  Weight:      Height:         General:  Appears acutely and critically ill Cardiovascular: Marked tachycardia  Respiratory: Diffuse rhonchorous breath sounds with marked tachypnea Abdomen: soft, NT, ND, NABS Lower extremity: 3+ LE edema.  Psychiatric: appropriate but clearly ill and dyspneic with conversation   Radiological  Exams on Admission: Dg Chest Port 1 View  Result Date: 05/01/2017 CLINICAL DATA:  Altered mental status, hypertension, tachypnea. EXAM: PORTABLE CHEST 1 VIEW COMPARISON:  PA and lateral chest x-ray of July 08, 2015 FINDINGS: The lungs are mildly hypoinflated but clear. The heart and pulmonary vascularity are normal. The mediastinum is normal in width. There is no pleural effusion. The bony thorax is unremarkable. IMPRESSION: Mild hypoinflation.  No acute cardiopulmonary abnormality. Electronically Signed   By: David  SwazilandJordan M.D.   On: 05/01/2017 11:48    Assessment/Plan Active Problems:   Lactic acidemia    Likely septic shock.  However, once I evaluated the patient and saw how unstable and critically ill he appeared, I terminated my evaluation and went to call for PCCM assistance.  It was then that I was informed that PCCM would be admitting the patient and that [facility] would no longer be involved in his care at this time.  [facility] will be happy to assume care of the patient once he is stabilized and able to be transferred out of the ICU.  Thank you for this interesting consult.  Time spent on consult: 45 minutes   Jonah BlueJennifer Amelianna Meller MD [facility]  If note is complete, please contact covering daytime or nighttime physician. [email or web address] Password Dr [email or web address]  05/01/2017, 6:00 PM

## 2017-05-01 NOTE — ED Provider Notes (Signed)
[facility] EMERGENCY DEPARTMENT Provider Note   CSN: [id number] Arrival date & time: 05/01/17  1028     History   Chief Complaint Chief Complaint  Patient presents with  . Cold Exposure    HPI Joseph Downs is a 67 y.o. male.  Patient brought in by EMS.  Was found outside in the cold.  Was very cold to touch.  Was breathing had slow to response from a mental status standpoint.  Unable to get blood pressure.  But patient was verbalizing some upon arrival patient clearly hypothermic blood pressures around 77.  Patient was awake would follow some commands.  Patient is not exactly sure how he ended up out in the cold but he states that he is homeless.  Denied any injuries.      No past medical history on file.  Patient Active Problem List   Diagnosis Date Noted  . Acute urinary retention   . AKI (acute kidney injury) ([place])   . Benign prostatic hyperplasia with urinary retention   . ARF (acute renal failure) ([place]) 03/25/2017    No past surgical history on file.     Home Medications    Prior to Admission medications   Medication Sig Start Date End Date Taking? Authorizing Provider  finasteride (PROSCAR) 5 MG tablet Take 5 mg by mouth daily.   Yes [provider]  tamsulosin (FLOMAX) 0.4 MG CAPS capsule Take 1 capsule (0.4 mg total) by mouth daily after supper. Patient not taking: Reported on 05/01/2017 03/26/17   Barton Dubois, MD    Family History Family History  Problem Relation Age of Onset  . Cancer Father     Social History Social History   Tobacco Use  . Smoking status: Never Smoker  . Smokeless tobacco: Never Used  Substance Use Topics  . Alcohol use: No  . Drug use: No     Allergies   Patient has no known allergies.   Review of Systems Review of Systems  Unable to perform ROS: Mental status change     Physical Exam Updated Vital Signs BP 105/62   Pulse (!) 121   Temp (!) 95.6 F (35.3 C) (Rectal)   Resp  (!) 37   Ht 1.778 m (_0 )   Wt 81.6 kg (180 lb)   SpO2 92%   BMI 25.83 kg/m   Physical Exam  Constitutional: He appears well-developed and well-nourished. He appears distressed.  HENT:  Head: Normocephalic and atraumatic.  Mucous membranes dry  Eyes: Conjunctivae and EOM are normal. Pupils are equal, round, and reactive to light.  Neck: Normal range of motion. Neck supple.  Cardiovascular: Regular rhythm and normal heart sounds.  Tachycardic  Pulmonary/Chest: Breath sounds normal. He is in respiratory distress.  Increased respiratory rate with hypoxia on 4 L nasal cannula.  Abdominal: Soft. Bowel sounds are normal. There is no tenderness.  Musculoskeletal: Normal range of motion. He exhibits no edema.  Neurological: He is alert.  Some level of confusion but verbalizing will move all extremities to command.  Skin:  Cool to touch  Nursing note and vitals reviewed.    ED Treatments / Results  Labs (all labs ordered are listed, but only abnormal results are displayed) Labs Reviewed  COMPREHENSIVE METABOLIC PANEL - Abnormal; Notable for the following components:      Result Value   CO2 14 (*)    Glucose, Bld 218 (*)    BUN 58 (*)    Creatinine, Ser 2.50 (*)  Calcium 8.7 (*)    Albumin 2.3 (*)    AST 81 (*)    ALT 83 (*)    GFR calc non Af Amer 25 (*)    GFR calc Af Amer 29 (*)    Anion gap 21 (*)    All other components within normal limits  CBC WITH DIFFERENTIAL/PLATELET - Abnormal; Notable for the following components:   WBC 15.6 (*)    Neutro Abs 13.0 (*)    All other components within normal limits  URINALYSIS, ROUTINE W REFLEX MICROSCOPIC - Abnormal; Notable for the following components:   Color, Urine AMBER (*)    APPearance TURBID (*)    Glucose, UA 50 (*)    Hgb urine dipstick MODERATE (*)    Protein, ur 100 (*)    Leukocytes, UA MODERATE (*)    All other components within normal limits  I-STAT CG4 LACTIC ACID, ED - Abnormal; Notable for the following  components:   Lactic Acid, Venous 7.85 (*)    All other components within normal limits  I-STAT ARTERIAL BLOOD GAS, ED - Abnormal; Notable for the following components:   pCO2 arterial 23.1 (*)    pO2, Arterial 51.0 (*)    Bicarbonate 13.1 (*)    TCO2 14 (*)    Acid-base deficit 11.0 (*)    All other components within normal limits  I-STAT CG4 LACTIC ACID, ED - Abnormal; Notable for the following components:   Lactic Acid, Venous 6.17 (*)    All other components within normal limits  CULTURE, BLOOD (ROUTINE X 2)  CULTURE, BLOOD (ROUTINE X 2)  URINE CULTURE  CBG MONITORING, ED    EKG  EKG Interpretation  Date/Time:  Wednesday May 01 2017 11:30:04 EST Ventricular Rate:  102 PR Interval:    QRS Duration: 127 QT Interval:  372 QTC Calculation: 485 R Axis:   64 Text Interpretation:  Sinus tachycardia Ventricular premature complex Nonspecific intraventricular conduction delay Borderline ST depression, diffuse leads Confirmed by Fredia Sorrow [phone]) on 05/01/2017 11:33:25 AM       Radiology Dg Chest Port 1 View  Result Date: 05/01/2017 CLINICAL DATA:  Altered mental status, hypertension, tachypnea. EXAM: PORTABLE CHEST 1 VIEW COMPARISON:  PA and lateral chest x-ray of July 08, 2015 FINDINGS: The lungs are mildly hypoinflated but clear. The heart and pulmonary vascularity are normal. The mediastinum is normal in width. There is no pleural effusion. The bony thorax is unremarkable. IMPRESSION: Mild hypoinflation.  No acute cardiopulmonary abnormality. Electronically Signed   By: David  Martinique M.D.   On: 05/01/2017 11:48    Procedures Procedures (including critical care time)  CRITICAL CARE Performed by: Fredia Sorrow Total critical care time:45 minutes Critical care time was exclusive of separately billable procedures and treating other patients. Critical care was necessary to treat or prevent imminent or life-threatening deterioration. Critical care was time spent  personally by me on the following activities: development of treatment plan with patient and/or surrogate as well as nursing, discussions with consultants, evaluation of patient's response to treatment, examination of patient, obtaining history from patient or surrogate, ordering and performing treatments and interventions, ordering and review of laboratory studies, ordering and review of radiographic studies, pulse oximetry and re-evaluation of patient's condition.   Medications Ordered in ED Medications  sodium chloride 0.9 % bolus 1,000 mL (0 mLs Intravenous Stopped 05/01/17 1209)    And  sodium chloride 0.9 % bolus 1,000 mL (0 mLs Intravenous Stopped 05/01/17 1320)    And  sodium  chloride 0.9 % bolus 500 mL (0 mLs Intravenous Hold 05/01/17 1349)  0.9 %  sodium chloride infusion (not administered)  vancomycin (VANCOCIN) IVPB 1000 mg/200 mL premix (not administered)  piperacillin-tazobactam (ZOSYN) IVPB 3.375 g (not administered)  piperacillin-tazobactam (ZOSYN) IVPB 3.375 g (0 g Intravenous Stopped 05/01/17 1217)  vancomycin (VANCOCIN) 1,750 mg in sodium chloride 0.9 % 500 mL IVPB (1,750 mg Intravenous New Bag/Given 05/01/17 1209)  sodium chloride 0.9 % bolus 1,000 mL (0 mLs Intravenous Stopped 05/01/17 1130)     Initial Impression / Assessment and Plan / ED Course  I have reviewed the triage vital signs and the nursing notes.  Pertinent labs & imaging results that were available during my care of the patient were reviewed by me and considered in my medical decision making (see chart for details).    Patient arrived hypothermic hypotensive tachycardic patient had been out in the elements all night, but met criteria for sepsis.  Sepsis protocol was initiated patient was put on warming blanket.  First 2 L of fluid patient started to show significant improvement.  Blood pressures were into the upper 80s.  Patient was mentating better.  Patient was started on broad-spectrum antibiotics.  This time  went on urinalysis showed urinary tract infection.  Chest x-ray without any acute findings.  Patient received a total of 3 L of fluid.  Blood pressure was above 112 systolic.  Body temperature improved to with the warming blanket.  Patient's initial lactic acid was in the upper sevens.  And after resuscitation repeat was still in the 6 range.  Clinically patient doing much better other than persistent tachycardia.  Initially discussed with hospitalist concerns for the marked lactic acid at the recommendation talk to critical care.  The lactic acid seemed adequate for admission by critical care they will be coming to see the patient.   Final Clinical Impressions(s) / ED Diagnoses   Final diagnoses:  Exposure to weather condition, initial encounter  Hypothermia, initial encounter  Hypotension, unspecified hypotension type  Acute cystitis with hematuria  Sepsis, due to unspecified organism [facility])    ED Discharge Orders    None       Fredia Sorrow, MD 05/01/17 1724

## 2017-05-01 NOTE — Progress Notes (Signed)
Dr. Carlota RaspberryScatliffe at bedside, ordered repeat ABG. RT notified and at bedside to draw ABG.

## 2017-05-01 NOTE — Progress Notes (Signed)
CRITICAL VALUE ALERT  Critical Value:  Lactic acid 5.9  Date & Time Notied:  05/01/17 2125  Provider Notified: Dr. Carlota RaspberryScatliffe  Orders Received/Actions taken: no new orders

## 2017-05-01 NOTE — Progress Notes (Signed)
Patient refusing BiPAP, patient educated extensively of need for BiPAP but is unwilling to tolerate it. Venti mask placed back on patient, MD notified. Will continue to monitor closely.

## 2017-05-01 NOTE — Procedures (Signed)
Intubation Procedure Note RONY RATZ [id number] [birth year]  Procedure: Intubation Indications: Respiratory insufficiency  Procedure Details Consent: Risks of procedure as well as the alternatives and risks of each were explained to the (patient/caregiver).  Consent for procedure obtained. Time Out: Verified patient identification, verified procedure, site/side was marked, verified correct patient position, special equipment/implants available, medications/allergies/relevent history reviewed, required imaging and test results available.  Performed  Maximum sterile technique was used including gloves, hand hygiene and mask.  MAC and 4    Evaluation Hemodynamic Status: Transient hypotension treated with pressors and treated with fluid; O2 sats: currently acceptable Patient's Current Condition: stable Complications: No apparent complications Patient did tolerate procedure well. Chest X-ray ordered to verify placement.  CXR: pending.   Joseph Downs 05/01/2017

## 2017-05-01 NOTE — ED Notes (Signed)
bair hugger turned off

## 2017-05-01 NOTE — ED Notes (Signed)
Critical Care Provider at bedside for evaluation.

## 2017-05-01 NOTE — H&P (Signed)
PULMONARY / CRITICAL CARE MEDICINE   Name: Joseph Downs MRN: [id number] DOB: July 06, 1950    ADMISSION DATE:  05/01/2017  CHIEF COMPLAINT: Found down  HISTORY OF PRESENT ILLNESS:        This is a 67 year old man who was found down.  It should be noted that it is been in the 40s for the past 24 hours.  He was tachypneic and hypotensive as well as hypothermic on arrival with a lactic acid in excess of 7.  He has received 3.5 L of crystalloid resuscitation along with vancomycin and Zosyn and he has been warmed with a bear hugger.  He is now alert and able to communicate with me.  He tells me that he had a syncopal episode last night but is a little hazy on the exact details.  He is not aware that he had any palpitations preceding that episode and he denies a prior history of seizure disorder.  He denies any new pains suggestive of an unprotected fall.  He denies use of street drugs or alcohol. Critical care service is asked to admit the patient because he still has a lactate acid in excess of 6 after being warmed and fluid resuscitated.  PAST MEDICAL HISTORY :  He  has no past medical history on file.  PAST SURGICAL HISTORY: He  has no past surgical history on file.  No Known Allergies  No current facility-administered medications on file prior to encounter.    Current Outpatient Medications on File Prior to Encounter  Medication Sig  . finasteride (PROSCAR) 5 MG tablet Take 5 mg by mouth daily.  . tamsulosin (FLOMAX) 0.4 MG CAPS capsule Take 1 capsule (0.4 mg total) by mouth daily after supper. (Patient not taking: Reported on 05/01/2017)    FAMILY HISTORY:  His indicated that his mother is alive. He indicated that his father is deceased.   SOCIAL HISTORY: He  reports that  has never smoked. he has never used smokeless tobacco. He reports that he does not drink alcohol or use drugs.  REVIEW OF SYSTEMS:   He tells me that his activity is currently limited by claudication.  He has no  prior known history of stroke seizure or transient neurological deficit.  He denies a history of known lung disease he is currently not suffering from any cough or dyspnea.  He does have some chronic right-sided chest pain.  He does not have any fevers chills sweats weight loss or adenopathy.  He does report lower extremity edema over the past 2 months, he denies any chest pain, he does report orthopnea.  He does not have a history of palpitations as noted.  He denies a previous known history of diabetes.  SUBJECTIVE:  As above  VITAL SIGNS: BP 104/68   Pulse (!) 134   Temp (!) 101.3 F (38.5 C) (Rectal)   Resp (!) 40   Ht 5\' 10"  (1.778 m)   Wt 180 lb (81.6 kg)   SpO2 95%   BMI 25.83 kg/m   HEMODYNAMICS:    VENTILATOR SETTINGS: FiO2 (%):  [45 %] 45 %  INTAKE / OUTPUT: No intake/output data recorded.  PHYSICAL EXAMINATION: General: This is a fit appearing 67 year old who is somewhat tachypneic but not labored. Neuro: He is entirely appropriately interactive but cannot identify the exact day.  He moves all fours on request, pupils are equal. Cardiovascular: S1 and S2 are regular without murmur rub or gallop. Lungs: Aspirations are unlabored, there is symmetric air movement although  there seems to be some decrease in air movement at the right base. Abdomen: Abdomen is flat and soft without any organomegaly masses tenderness guarding or rebound Musculoskeletal: 2+ edema to the knees bilaterally  LABS:  BMET Recent Labs  Lab 05/01/17 1110  NA 145  K 4.1  CL 110  CO2 14*  BUN 58*  CREATININE 2.50*  GLUCOSE 218*    Electrolytes Recent Labs  Lab 05/01/17 1110  CALCIUM 8.7*    CBC Recent Labs  Lab 05/01/17 1110  WBC 15.6*  HGB 13.1  HCT 41.3  PLT 181    Coag's No results for input(s): APTT, INR in the last 168 hours.  Sepsis Markers Recent Labs  Lab 05/01/17 1123 05/01/17 1301  LATICACIDVEN 7.85* 6.17*    ABG Recent Labs  Lab 05/01/17 1159   PHART 7.353  PCO2ART 23.1*  PO2ART 51.0*    Liver Enzymes Recent Labs  Lab 05/01/17 1110  AST 81*  ALT 83*  ALKPHOS 75  BILITOT 0.9  ALBUMIN 2.3*    Cardiac Enzymes No results for input(s): TROPONINI, PROBNP in the last 168 hours.  Glucose No results for input(s): GLUCAP in the last 168 hours.  Imaging Dg Chest Port 1 View  Result Date: 05/01/2017 CLINICAL DATA:  Altered mental status, hypertension, tachypnea. EXAM: PORTABLE CHEST 1 VIEW COMPARISON:  PA and lateral chest x-ray of July 08, 2015 FINDINGS: The lungs are mildly hypoinflated but clear. The heart and pulmonary vascularity are normal. The mediastinum is normal in width. There is no pleural effusion. The bony thorax is unremarkable. IMPRESSION: Mild hypoinflation.  No acute cardiopulmonary abnormality. Electronically Signed   By: David  SwazilandJordan M.D.   On: 05/01/2017 11:48     STUDIES:  On reviewing the chest x-ray suspect there may be some subtle air bronchograms at the right base   ANTIBIOTICS: Vancomycin and Zosyn initiated on 2/13   DISCUSSION:      This is a 67 year old who was found hypothermic hypotensive and tachypneic and unresponsive in a cold environment.  After fluid resuscitating the patient and warming him he has a persistent lactic acidosis.  Both urine and chest exam suggest a source of infection.  In addition there is concern as to why he became unresponsive and suffered from exposure to the cold.  He gives a history of what sounds like new onset congestive heart failure I am concerned that he had an arrhythmic event.  ASSESSMENT / PLAN:  PULMONARY A: Has been broadly covered with vancomycin and Zosyn pending culture results.  As he did suffer from a syncopal episode I have also ordered a d-dimer and Dopplers of the lower extremity to rule out thromboembolic disease as the provocation.  CARDIOVASCULAR A: He has impressive lower extremity edema and gives a history of orthopnea as well as  claudication.  I have ordered an echocardiogram as a first step in evaluation.  Is also getting serial troponins.  RENAL A: I know nothing of his baseline renal function.  As he was immobile for some time in the cold I have ordered a CPK to rule out rhabdomyolysis contributing to his renal dysfunction.  GASTROINTESTINAL A: Prophylaxis is with  Pepcid  INFECTIOUS A:   There is a suggestion of a right lower lobe infiltrate on the and his urine is suggestive of infection as well.  He is very adequately covered with vancomycin and Zosyn for the present pending culture results.  It is not entirely clear to me that his lactic acidosis  is entirely secondary to infection however I am continuing generous volume resuscitation and monitoring serial lactates.  ENDOCRINE A: He denies a known history of diabetes but he is glucose intolerant at the present.  An A1c has been ordered along with sliding scale insulin coverage.  Greater than 35 minutes was spent in the care of this patient today who is suffering from a life-threatening illness with a lactic acidosis suggestive of severe underlying physiologic derangement.   Penny Pia, MD Pulmonary and Critical Care Medicine [facility] Pager: [phone]  05/01/2017, 5:30 PM

## 2017-05-01 NOTE — Progress Notes (Signed)
Pharmacy Antibiotic Note  Joseph Downs is a 67 y.o. male admitted on 05/01/2017 with sepsis.  Pharmacy has been consulted for Vancomycin and Zosyn dosing. WBC elevated at 15.6. LA 7.85>6.17. SCr elevated at 2.5 (BL ~ 1.2). CrCl ~ 20-25 mL/min  Plan: -Zosyn 3.375 gm IV once over 30 minutes, then Zosyn 3.375 gm IV Q 8 hours (EI infusion) -Vancomycin 1750 mg IV once, then vancomycin 1 gm IV Q 24 hours -Monitor CBC, renal fx, cultures and clinical progress -VT at SS     Temp (24hrs), Avg:95.7 F (35.4 C), Min:95.7 F (35.4 C), Max:95.7 F (35.4 C)  No results for input(s): WBC, CREATININE, LATICACIDVEN, VANCOTROUGH, VANCOPEAK, VANCORANDOM, GENTTROUGH, GENTPEAK, GENTRANDOM, TOBRATROUGH, TOBRAPEAK, TOBRARND, AMIKACINPEAK, AMIKACINTROU, AMIKACIN in the last 168 hours.  CrCl cannot be calculated (Patient's most recent lab result is older than the maximum 21 days allowed.).    No Known Allergies  Antimicrobials this admission: Vanc 2/13 >>  Zosyn 2/13 >>   Dose adjustments this admission: None   Microbiology results: 2/13 BCx:    Thank you for allowing pharmacy to be a part of this patient's care.  Vinnie LevelBenjamin Javaria Knapke, PharmD., BCPS Clinical Pharmacist Pager [phone]

## 2017-05-01 NOTE — ED Triage Notes (Signed)
Pt found down by PD, in or near a puddle of water.  Tachycardia, cold to touch and possible seizure like activity noted per EMS.  Pt is verbal upon arrival to ED.  Maintaining airway, however tachypneic and Hypotensive

## 2017-05-02 ENCOUNTER — Inpatient Hospital Stay (HOSPITAL_COMMUNITY): Payer: Medicare Other

## 2017-05-02 DIAGNOSIS — E872 Acidosis: Secondary | ICD-10-CM

## 2017-05-02 DIAGNOSIS — J9600 Acute respiratory failure, unspecified whether with hypoxia or hypercapnia: Secondary | ICD-10-CM

## 2017-05-02 DIAGNOSIS — J9601 Acute respiratory failure with hypoxia: Secondary | ICD-10-CM

## 2017-05-02 DIAGNOSIS — I959 Hypotension, unspecified: Secondary | ICD-10-CM

## 2017-05-02 DIAGNOSIS — E440 Moderate protein-calorie malnutrition: Secondary | ICD-10-CM

## 2017-05-02 DIAGNOSIS — I361 Nonrheumatic tricuspid (valve) insufficiency: Secondary | ICD-10-CM

## 2017-05-02 DIAGNOSIS — I214 Non-ST elevation (NSTEMI) myocardial infarction: Secondary | ICD-10-CM

## 2017-05-02 DIAGNOSIS — X398XXA Other exposure to forces of nature, initial encounter: Secondary | ICD-10-CM

## 2017-05-02 DIAGNOSIS — M7989 Other specified soft tissue disorders: Secondary | ICD-10-CM

## 2017-05-02 LAB — POCT I-STAT 3, ART BLOOD GAS (G3+)
Acid-base deficit: 6 mmol/L — ABNORMAL HIGH (ref 0.0–2.0)
Bicarbonate: 21.2 mmol/L (ref 20.0–28.0)
O2 Saturation: 100 %
PCO2 ART: 47.2 mmHg (ref 32.0–48.0)
PH ART: 7.26 — AB (ref 7.350–7.450)
Patient temperature: 98.6
TCO2: 23 mmol/L (ref 22–32)
pO2, Arterial: 349 mmHg — ABNORMAL HIGH (ref 83.0–108.0)

## 2017-05-02 LAB — GLUCOSE, CAPILLARY
GLUCOSE-CAPILLARY: 123 mg/dL — AB (ref 65–99)
Glucose-Capillary: 138 mg/dL — ABNORMAL HIGH (ref 65–99)
Glucose-Capillary: 102 mg/dL — ABNORMAL HIGH (ref 65–99)
Glucose-Capillary: 82 mg/dL (ref 65–99)
Glucose-Capillary: 123 mg/dL — ABNORMAL HIGH (ref 65–99)

## 2017-05-02 LAB — CBC
HCT: 32.8 % — ABNORMAL LOW (ref 39.0–52.0)
HEMATOCRIT: 30.3 % — AB (ref 39.0–52.0)
HEMOGLOBIN: 9.6 g/dL — AB (ref 13.0–17.0)
Hemoglobin: 10.4 g/dL — ABNORMAL LOW (ref 13.0–17.0)
MCH: 28.4 pg (ref 26.0–34.0)
MCH: 29 pg (ref 26.0–34.0)
MCHC: 31.7 g/dL (ref 30.0–36.0)
MCHC: 31.7 g/dL (ref 30.0–36.0)
MCV: 89.6 fL (ref 78.0–100.0)
MCV: 91.4 fL (ref 78.0–100.0)
PLATELETS: 137 10*3/uL — AB (ref 150–400)
Platelets: 88 10*3/uL — ABNORMAL LOW (ref 150–400)
RBC: 3.38 MIL/uL — ABNORMAL LOW (ref 4.22–5.81)
RBC: 3.59 MIL/uL — ABNORMAL LOW (ref 4.22–5.81)
RDW: 13.7 % (ref 11.5–15.5)
RDW: 14.3 % (ref 11.5–15.5)
WBC: 13.6 10*3/uL — ABNORMAL HIGH (ref 4.0–10.5)
WBC: 20.2 10*3/uL — AB (ref 4.0–10.5)

## 2017-05-02 LAB — ECHOCARDIOGRAM COMPLETE
HEIGHTINCHES: 70 in
Weight: 2952.4 oz

## 2017-05-02 LAB — BLOOD CULTURE ID PANEL (REFLEXED)
Acinetobacter baumannii: NOT DETECTED
CANDIDA KRUSEI: NOT DETECTED
Candida albicans: NOT DETECTED
Candida glabrata: NOT DETECTED
Candida parapsilosis: NOT DETECTED
Candida tropicalis: NOT DETECTED
ESCHERICHIA COLI: NOT DETECTED
Enterobacter cloacae complex: NOT DETECTED
Enterobacteriaceae species: NOT DETECTED
Enterococcus species: NOT DETECTED
HAEMOPHILUS INFLUENZAE: NOT DETECTED
Klebsiella oxytoca: NOT DETECTED
Klebsiella pneumoniae: NOT DETECTED
Listeria monocytogenes: NOT DETECTED
Neisseria meningitidis: NOT DETECTED
PROTEUS SPECIES: NOT DETECTED
PSEUDOMONAS AERUGINOSA: NOT DETECTED
SERRATIA MARCESCENS: NOT DETECTED
STAPHYLOCOCCUS AUREUS BCID: NOT DETECTED
STAPHYLOCOCCUS SPECIES: NOT DETECTED
STREPTOCOCCUS PNEUMONIAE: NOT DETECTED
Streptococcus agalactiae: NOT DETECTED
Streptococcus pyogenes: NOT DETECTED
Streptococcus species: NOT DETECTED

## 2017-05-02 LAB — COMPREHENSIVE METABOLIC PANEL
ALK PHOS: 63 U/L (ref 38–126)
ALT: 957 U/L — ABNORMAL HIGH (ref 17–63)
ALT: 868 U/L — AB (ref 17–63)
ANION GAP: 15 (ref 5–15)
AST: 2228 U/L — AB (ref 15–41)
AST: 1471 U/L — AB (ref 15–41)
Albumin: 1.9 g/dL — ABNORMAL LOW (ref 3.5–5.0)
Albumin: 1.9 g/dL — ABNORMAL LOW (ref 3.5–5.0)
Alkaline Phosphatase: 70 U/L (ref 38–126)
Anion gap: 17 — ABNORMAL HIGH (ref 5–15)
BILIRUBIN TOTAL: 0.9 mg/dL (ref 0.3–1.2)
BUN: 82 mg/dL — AB (ref 6–20)
BUN: 89 mg/dL — AB (ref 6–20)
CO2: 14 mmol/L — AB (ref 22–32)
CO2: 15 mmol/L — ABNORMAL LOW (ref 22–32)
CREATININE: 5.76 mg/dL — AB (ref 0.61–1.24)
Calcium: 7.4 mg/dL — ABNORMAL LOW (ref 8.9–10.3)
Calcium: 7.1 mg/dL — ABNORMAL LOW (ref 8.9–10.3)
Chloride: 115 mmol/L — ABNORMAL HIGH (ref 101–111)
Chloride: 116 mmol/L — ABNORMAL HIGH (ref 101–111)
Creatinine, Ser: 4.97 mg/dL — ABNORMAL HIGH (ref 0.61–1.24)
GFR calc Af Amer: 13 mL/min — ABNORMAL LOW (ref >60)
GFR calc non Af Amer: 11 mL/min — ABNORMAL LOW (ref >60)
GFR, EST AFRICAN AMERICAN: 11 mL/min — AB (ref >60)
GFR, EST NON AFRICAN AMERICAN: 9 mL/min — AB (ref >60)
GLUCOSE: 121 mg/dL — AB (ref 65–99)
Glucose, Bld: 132 mg/dL — ABNORMAL HIGH (ref 65–99)
POTASSIUM: 6.5 mmol/L — AB (ref 3.5–5.1)
Potassium: 5.8 mmol/L — ABNORMAL HIGH (ref 3.5–5.1)
SODIUM: 144 mmol/L (ref 135–145)
Sodium: 148 mmol/L — ABNORMAL HIGH (ref 135–145)
TOTAL PROTEIN: 5.8 g/dL — AB (ref 6.5–8.1)
Total Bilirubin: 0.8 mg/dL (ref 0.3–1.2)
Total Protein: 6 g/dL — ABNORMAL LOW (ref 6.5–8.1)

## 2017-05-02 LAB — LIPID PANEL
CHOL/HDL RATIO: 6.1 ratio
Cholesterol: 61 mg/dL (ref 0–200)
HDL: 10 mg/dL — ABNORMAL LOW (ref >40)
LDL CALC: 41 mg/dL (ref 0–99)
TRIGLYCERIDES: 51 mg/dL (ref <150)
VLDL: 10 mg/dL (ref 0–40)

## 2017-05-02 LAB — TROPONIN I
TROPONIN I: 5.65 ng/mL — AB (ref <0.03)
Troponin I: 5.3 ng/mL (ref <0.03)
Troponin I: 3.84 ng/mL (ref <0.03)

## 2017-05-02 LAB — LACTIC ACID, PLASMA
Lactic Acid, Venous: 1.5 mmol/L (ref 0.5–1.9)
Lactic Acid, Venous: 1.9 mmol/L (ref 0.5–1.9)

## 2017-05-02 LAB — PHOSPHORUS: PHOSPHORUS: 6.2 mg/dL — AB (ref 2.5–4.6)

## 2017-05-02 LAB — TSH: TSH: 1.304 u[IU]/mL (ref 0.350–4.500)

## 2017-05-02 LAB — HEPARIN LEVEL (UNFRACTIONATED)
HEPARIN UNFRACTIONATED: 0.1 [IU]/mL — AB (ref 0.30–0.70)
HEPARIN UNFRACTIONATED: 0.29 [IU]/mL — AB (ref 0.30–0.70)
Heparin Unfractionated: 0.57 IU/mL (ref 0.30–0.70)

## 2017-05-02 LAB — PROCALCITONIN: Procalcitonin: 19.69 ng/mL

## 2017-05-02 LAB — MAGNESIUM
MAGNESIUM: 2.5 mg/dL — AB (ref 1.7–2.4)
Magnesium: 1.9 mg/dL (ref 1.7–2.4)

## 2017-05-02 LAB — PSA: PROSTATIC SPECIFIC ANTIGEN: 42.55 ng/mL — AB (ref 0.00–4.00)

## 2017-05-02 MED ORDER — ASPIRIN 81 MG PO CHEW
81.0000 mg | CHEWABLE_TABLET | Freq: Every day | ORAL | Status: DC
  Administered 2017-05-03 – 2017-06-13 (×41): 81 mg
  Filled 2017-05-02 (×41): qty 1

## 2017-05-02 MED ORDER — SODIUM POLYSTYRENE SULFONATE PO POWD
30.0000 g | Freq: Once | ORAL | Status: AC
  Administered 2017-05-02: 30 g
  Filled 2017-05-02: qty 30

## 2017-05-02 MED ORDER — VITAL AF 1.2 CAL PO LIQD
1000.0000 mL | ORAL | Status: DC
  Administered 2017-05-02 – 2017-05-14 (×13): 1000 mL
  Filled 2017-05-02 (×3): qty 1000

## 2017-05-02 MED ORDER — SODIUM CHLORIDE 0.9 % IV BOLUS (SEPSIS)
1000.0000 mL | Freq: Once | INTRAVENOUS | Status: AC
  Administered 2017-05-02: 1000 mL via INTRAVENOUS

## 2017-05-02 MED ORDER — SODIUM BICARBONATE 8.4 % IV SOLN
INTRAVENOUS | Status: AC
  Filled 2017-05-02: qty 100

## 2017-05-02 MED ORDER — DEXTROSE 50 % IV SOLN
INTRAVENOUS | Status: AC
  Filled 2017-05-02: qty 50

## 2017-05-02 MED ORDER — SODIUM POLYSTYRENE SULFONATE PO POWD
30.0000 g | Freq: Once | ORAL | Status: DC
  Filled 2017-05-02: qty 30

## 2017-05-02 MED ORDER — INSULIN ASPART 100 UNIT/ML ~~LOC~~ SOLN
10.0000 [IU] | Freq: Once | SUBCUTANEOUS | Status: AC
  Administered 2017-05-02: 10 [IU] via INTRAVENOUS

## 2017-05-02 MED ORDER — SODIUM POLYSTYRENE SULFONATE 15 GM/60ML PO SUSP
30.0000 g | Freq: Once | ORAL | Status: AC
  Administered 2017-05-02: 30 g
  Filled 2017-05-02: qty 120

## 2017-05-02 MED ORDER — VANCOMYCIN HCL IN DEXTROSE 1-5 GM/200ML-% IV SOLN
1000.0000 mg | INTRAVENOUS | Status: DC
  Administered 2017-05-03: 1000 mg via INTRAVENOUS
  Filled 2017-05-02: qty 200

## 2017-05-02 MED ORDER — SODIUM BICARBONATE 8.4 % IV SOLN
100.0000 meq | Freq: Once | INTRAVENOUS | Status: AC
  Administered 2017-05-02: 100 meq via INTRAVENOUS
  Filled 2017-05-02: qty 100

## 2017-05-02 MED ORDER — ASPIRIN 81 MG PO CHEW: 324.0000 mg | CHEWABLE_TABLET | Freq: Every day | ORAL | Status: DC

## 2017-05-02 MED ORDER — PIPERACILLIN-TAZOBACTAM IN DEX 2-0.25 GM/50ML IV SOLN
2.2500 g | Freq: Four times a day (QID) | INTRAVENOUS | Status: DC
  Administered 2017-05-02 – 2017-05-04 (×7): 2.25 g via INTRAVENOUS
  Filled 2017-05-02 (×8): qty 50

## 2017-05-02 MED ORDER — SODIUM CHLORIDE 0.9 % IV SOLN
INTRAVENOUS | Status: DC
  Administered 2017-05-02: 150 mL/h via INTRAVENOUS

## 2017-05-02 MED ORDER — ORAL CARE MOUTH RINSE
15.0000 mL | Freq: Four times a day (QID) | OROMUCOSAL | Status: DC
  Administered 2017-05-02 – 2017-05-10 (×32): 15 mL via OROMUCOSAL

## 2017-05-02 MED ORDER — DEXTROSE 50 % IV SOLN
1.0000 | Freq: Once | INTRAVENOUS | Status: AC
Start: 2017-05-02 — End: 2017-05-02
  Administered 2017-05-02: 50 mL via INTRAVENOUS
  Filled 2017-05-02: qty 50

## 2017-05-02 MED ORDER — SODIUM POLYSTYRENE SULFONATE 15 GM/60ML PO SUSP
30.0000 g | Freq: Once | ORAL | Status: DC
  Filled 2017-05-02: qty 120

## 2017-05-02 MED ORDER — SODIUM CHLORIDE 0.9 % IV SOLN: 0.0000 ug/kg/h | INTRAVENOUS | Status: DC

## 2017-05-02 MED ORDER — CHLORHEXIDINE GLUCONATE 0.12% ORAL RINSE (MEDLINE KIT)
15.0000 mL | Freq: Two times a day (BID) | OROMUCOSAL | Status: DC
  Administered 2017-05-02 – 2017-05-10 (×17): 15 mL via OROMUCOSAL

## 2017-05-02 MED ORDER — MIDAZOLAM HCL 2 MG/2ML IJ SOLN
1.0000 mg | INTRAMUSCULAR | Status: DC | PRN
  Administered 2017-05-01 – 2017-05-02 (×3): 2 mg via INTRAVENOUS
  Administered 2017-05-02: 1 mg via INTRAVENOUS
  Administered 2017-05-02 – 2017-05-15 (×35): 2 mg via INTRAVENOUS
  Filled 2017-05-02 (×38): qty 2

## 2017-05-02 MED ORDER — ASPIRIN 81 MG PO CHEW
324.0000 mg | CHEWABLE_TABLET | Freq: Every day | ORAL | Status: DC
  Administered 2017-05-02: 324 mg
  Filled 2017-05-02: qty 4

## 2017-05-02 MED ORDER — SODIUM CHLORIDE 0.9 % IV SOLN
0.0000 ug/kg/h | INTRAVENOUS | Status: DC
  Filled 2017-05-02: qty 2

## 2017-05-02 MED ORDER — MIDAZOLAM HCL 2 MG/2ML IJ SOLN: 1.0000 mg | INTRAMUSCULAR | Status: DC | PRN

## 2017-05-02 MED ORDER — SODIUM CHLORIDE 0.9 % IV SOLN
1.0000 g | Freq: Once | INTRAVENOUS | Status: AC
  Administered 2017-05-02: 1 g via INTRAVENOUS
  Filled 2017-05-02: qty 10

## 2017-05-02 NOTE — Progress Notes (Signed)
Preliminary results by tech - Venous Duplex Lower Ext. Completed. Right leg, positive for acute deep vein thrombosis in the calf veins only - posterior tibial and peroneal veins. Left leg, extensive acute deep vein thrombosis involving the femoral vein, popliteal vein and calf veins. Results given to patient's nurse, Bonita QuinLinda. Marilynne Halstedita Jaselyn Nahm, BS, RDMS, RVT

## 2017-05-02 NOTE — Consult Note (Signed)
HPI: Joseph Downs is a 67 y.o. male with unknown medical history presenting after he was found down, unresponsive and hypothermic.  The patient was hypotensive, tachycardic and tachypneic.  He was treated with warming and IVFs.  On 1/21 creat was 1.29m/dl.  On admission on 2/13 creat was 2.5, 2/13 3.35, and today cr 4.97 and K 6.5 and bicarb 14. Lactate was 7.5 on admit. AST rose to 2228, ALT 957 on 05/02/17, CK was 2933 on 2/13, UA 2/13 revealed TNTC RBCs and TNTC WBCs.  UOP is negligible and renal consultation requested.  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 He is intubated and being supported with IV pressors and getting bolused with IVFs.   No past medical history on file. No past surgical history on file. Social History:  reports that  has never smoked. he has never used smokeless tobacco. He reports that he does not drink alcohol or use drugs. Allergies: No Known Allergies Family History  Problem Relation Age of Onset  . Cancer Father     Medications:  Scheduled: . [START ON 05/03/2017] aspirin  81 mg Per Tube Daily  . chlorhexidine gluconate (MEDLINE KIT)  15 mL Mouth Rinse BID  . insulin aspart  0-9 Units Subcutaneous TID WC  . ipratropium-albuterol  3 mL Nebulization Q6H  . mouth rinse  15 mL Mouth Rinse QID  . pantoprazole (PROTONIX) IV  40 mg Intravenous QHS  . thiamine injection  100 mg Intravenous Daily   Continuous: . sodium chloride    . sodium chloride 100 mL/hr (05/02/17 1041)  . feeding supplement (VITAL AF 1.2 CAL) 1,000 mL (05/02/17 1327)  . fentaNYL infusion INTRAVENOUS 100 mcg/hr (05/02/17 1100)  . heparin 1,200 Units/hr (05/02/17 1020)  . norepinephrine (LEVOPHED) Adult infusion 5 mcg/min (05/02/17 1430)  . piperacillin-tazobactam (ZOSYN)  IV    . sodium chloride Stopped (05/01/17 1349)  . [START ON 05/03/2017] vancomycin      ROS: unobtainable Blood pressure (!) 89/76, pulse 99, temperature 98.1 F (36.7 C), temperature source Oral,  resp. rate 15, height 5' 10"  (1.778 m), weight 83.7 kg (184 lb 8.4 oz), SpO2 97 %.  General appearance: intubated Head: Normocephalic, without obvious abnormality, atraumatic, sunken features Eyes: negative Nose: Nares normal. Septum midline. Mucosa normal. No drainage or sinus tenderness. Throat: oral intubation Resp: clear to auscultation bilaterally Chest wall: no tenderness Cardio: regular rate and rhythm, S1, S2 normal, no murmur, click, rub or gallop GI: soft, non-tender; bowel sounds normal; no masses,  no organomegaly Extrem: 2+ edema bilat Skin Turgor reduced Neurologic: unresponsive Results for orders placed or performed during the hospital encounter of 05/01/17 (from the past 48 hour(s))  Blood Culture (routine x 2)     Status: None (Preliminary result)   Collection Time: 05/01/17 11:00 AM  Result Value Ref Range   Specimen Description BLOOD RIGHT WRIST    Special Requests      BOTTLES DRAWN AEROBIC ONLY Blood Culture adequate volume   Culture  Setup Time      GRAM NEGATIVE COCCOBACILLI GRAM POSITIVE RODS AEROBIC BOTTLE ONLY Organism ID to follow CRITICAL RESULT CALLED TO, READ BACK BY AND VERIFIED WITH: E. SVernona RiegerPharm.D. 15:45 05/02/17 ([email or web address])    Culture      NO GROWTH 1 DAY Performed at [facility] [address], [place] [address]   Report Status PENDING   Blood Culture ID Panel (Reflexed)     Status: None  Collection Time: 05/01/17 11:00 AM  Result Value Ref Range   Enterococcus species NOT DETECTED NOT DETECTED   Listeria monocytogenes NOT DETECTED NOT DETECTED   Staphylococcus species NOT DETECTED NOT DETECTED   Staphylococcus aureus NOT DETECTED NOT DETECTED   Streptococcus species NOT DETECTED NOT DETECTED   Streptococcus agalactiae NOT DETECTED NOT DETECTED   Streptococcus pneumoniae NOT DETECTED NOT DETECTED   Streptococcus pyogenes NOT DETECTED NOT DETECTED   Acinetobacter baumannii NOT DETECTED NOT DETECTED    Enterobacteriaceae species NOT DETECTED NOT DETECTED   Enterobacter cloacae complex NOT DETECTED NOT DETECTED   Escherichia coli NOT DETECTED NOT DETECTED   Klebsiella oxytoca NOT DETECTED NOT DETECTED   Klebsiella pneumoniae NOT DETECTED NOT DETECTED   Proteus species NOT DETECTED NOT DETECTED   Serratia marcescens NOT DETECTED NOT DETECTED   Haemophilus influenzae NOT DETECTED NOT DETECTED   Neisseria meningitidis NOT DETECTED NOT DETECTED   Pseudomonas aeruginosa NOT DETECTED NOT DETECTED   Candida albicans NOT DETECTED NOT DETECTED   Candida glabrata NOT DETECTED NOT DETECTED   Candida krusei NOT DETECTED NOT DETECTED   Candida parapsilosis NOT DETECTED NOT DETECTED   Candida tropicalis NOT DETECTED NOT DETECTED  Comprehensive metabolic panel     Status: Abnormal   Collection Time: 05/01/17 11:10 AM  Result Value Ref Range   Sodium 145 135 - 145 mmol/L   Potassium 4.1 3.5 - 5.1 mmol/L   Chloride 110 101 - 111 mmol/L   CO2 14 (L) 22 - 32 mmol/L   Glucose, Bld 218 (H) 65 - 99 mg/dL   BUN 58 (H) 6 - 20 mg/dL   Creatinine, Ser 2.50 (H) 0.61 - 1.24 mg/dL   Calcium 8.7 (L) 8.9 - 10.3 mg/dL   Total Protein 7.5 6.5 - 8.1 g/dL   Albumin 2.3 (L) 3.5 - 5.0 g/dL   AST 81 (H) 15 - 41 U/L   ALT 83 (H) 17 - 63 U/L   Alkaline Phosphatase 75 38 - 126 U/L   Total Bilirubin 0.9 0.3 - 1.2 mg/dL   GFR calc non Af Amer 25 (L) >60 mL/min   GFR calc Af Amer 29 (L) >60 mL/min    Comment: (NOTE) The eGFR has been calculated using the CKD EPI equation. This calculation has not been validated in all clinical situations. eGFR's persistently <60 mL/min signify possible Chronic Kidney Disease.    Anion gap 21 (H) 5 - 15    Comment: Performed at [facility], [place] [address] [place], [place] [address]  CBC WITH DIFFERENTIAL     Status: Abnormal   Collection Time: 05/01/17 11:10 AM  Result Value Ref Range   WBC 15.6 (H) 4.0 - 10.5 K/uL   RBC 4.49 4.22 - 5.81 MIL/uL   Hemoglobin 13.1 13.0 -  17.0 g/dL   HCT 41.3 39.0 - 52.0 %   MCV 92.0 78.0 - 100.0 fL   MCH 29.2 26.0 - 34.0 pg   MCHC 31.7 30.0 - 36.0 g/dL   RDW 13.9 11.5 - 15.5 %   Platelets 181 150 - 400 K/uL   Neutrophils Relative % 83 %   Lymphocytes Relative 11 %   Monocytes Relative 6 %   Eosinophils Relative 0 %   Basophils Relative 0 %   Neutro Abs 13.0 (H) 1.7 - 7.7 K/uL   Lymphs Abs 1.7 0.7 - 4.0 K/uL   Monocytes Absolute 0.9 0.1 - 1.0 K/uL   Eosinophils Absolute 0.0 0.0 - 0.7 K/uL   Basophils  Absolute 0.0 0.0 - 0.1 K/uL   Smear Review MORPHOLOGY UNREMARKABLE     Comment: Performed at [facility], [place] [address], [place], [place] [address]  Blood Culture (routine x 2)     Status: None (Preliminary result)   Collection Time: 05/01/17 11:15 AM  Result Value Ref Range   Specimen Description BLOOD LEFT WRIST    Special Requests IN PEDIATRIC BOTTLE Blood Culture adequate volume    Culture      NO GROWTH 1 DAY Performed at [facility], [place] [address], [place], [place] [address]    Report Status PENDING   I-Stat CG4 Lactic Acid, ED  (not at  [facility])     Status: Abnormal   Collection Time: 05/01/17 11:23 AM  Result Value Ref Range   Lactic Acid, Venous 7.85 (HH) 0.5 - 1.9 mmol/L   Comment NOTIFIED PHYSICIAN   I-Stat Arterial Blood Gas, ED - (order at [facility] and [facility] only)     Status: Abnormal   Collection Time: 05/01/17 11:59 AM  Result Value Ref Range   pH, Arterial 7.353 7.350 - 7.450   pCO2 arterial 23.1 (L) 32.0 - 48.0 mmHg   pO2, Arterial 51.0 (L) 83.0 - 108.0 mmHg   Bicarbonate 13.1 (L) 20.0 - 28.0 mmol/L   TCO2 14 (L) 22 - 32 mmol/L   O2 Saturation 88.0 %   Acid-base deficit 11.0 (H) 0.0 - 2.0 mmol/L   Patient temperature 95.6 F    Collection site BRACHIAL ARTERY    Drawn by Operator    Sample type ARTERIAL   I-Stat CG4 Lactic Acid, ED  (not at  [facility])     Status: Abnormal   Collection Time: 05/01/17  1:01 PM  Result Value Ref Range   Lactic Acid, Venous 6.17 (HH) 0.5 - 1.9 mmol/L    Comment NOTIFIED PHYSICIAN   Urinalysis, Routine w reflex microscopic     Status: Abnormal   Collection Time: 05/01/17  1:13 PM  Result Value Ref Range   Color, Urine AMBER (A) YELLOW    Comment: BIOCHEMICALS MAY BE AFFECTED BY COLOR   APPearance TURBID (A) CLEAR   Specific Gravity, Urine 1.014 1.005 - 1.030   pH 5.0 5.0 - 8.0   Glucose, UA 50 (A) NEGATIVE mg/dL   Hgb urine dipstick MODERATE (A) NEGATIVE   Bilirubin Urine NEGATIVE NEGATIVE   Ketones, ur NEGATIVE NEGATIVE mg/dL   Protein, ur 100 (A) NEGATIVE mg/dL   Nitrite NEGATIVE NEGATIVE   Leukocytes, UA MODERATE (A) NEGATIVE   RBC / HPF TOO NUMEROUS TO COUNT 0 - 5 RBC/hpf   WBC, UA TOO NUMEROUS TO COUNT 0 - 5 WBC/hpf   Bacteria, UA NONE SEEN NONE SEEN   Squamous Epithelial / LPF NONE SEEN NONE SEEN   Hyaline Casts, UA PRESENT     Comment: Performed at [facility], [address], [place], [place] [address]  Urine culture     Status: Abnormal (Preliminary result)   Collection Time: 05/01/17  1:13 PM  Result Value Ref Range   Specimen Description URINE, CATHETERIZED    Special Requests      NONE Performed at [facility], [place] [address], [place], [place] [address]    Culture >=100,000 COLONIES/mL STAPHYLOCOCCUS LUGDUNENSIS (A)    Report Status PENDING   Urine rapid drug screen (hosp performed)     Status: None   Collection Time: 05/01/17  1:13 PM  Result Value Ref Range   Opiates  NONE DETECTED NONE DETECTED   Cocaine NONE DETECTED NONE DETECTED   Benzodiazepines NONE DETECTED NONE DETECTED   Amphetamines NONE DETECTED NONE DETECTED   Tetrahydrocannabinol NONE DETECTED NONE DETECTED   Barbiturates NONE DETECTED NONE DETECTED    Comment: (NOTE) DRUG SCREEN FOR MEDICAL PURPOSES ONLY.  IF CONFIRMATION IS NEEDED FOR ANY PURPOSE, NOTIFY LAB WITHIN 5 DAYS. LOWEST DETECTABLE LIMITS FOR URINE DRUG SCREEN Drug Class                     Cutoff (ng/mL) Amphetamine and metabolites    1000 Barbiturate and  metabolites    200 Benzodiazepine                 103 Tricyclics and metabolites     300 Opiates and metabolites        300 Cocaine and metabolites        300 THC                            50 Performed at [facility], [place] [address] [place], [place] [address]   MRSA PCR Screening     Status: None   Collection Time: 05/01/17  7:01 PM  Result Value Ref Range   MRSA by PCR NEGATIVE NEGATIVE    Comment:        The GeneXpert MRSA Assay (FDA approved for NASAL specimens only), is one component of a comprehensive MRSA colonization surveillance program. It is not intended to diagnose MRSA infection nor to guide or monitor treatment for MRSA infections. Performed at [facility], [place] [address], [place], [place] [address]   Procalcitonin - Baseline     Status: None   Collection Time: 05/01/17  7:54 PM  Result Value Ref Range   Procalcitonin 14.36 ng/mL    Comment:        Interpretation: PCT >= 10 ng/mL: Important systemic inflammatory response, almost exclusively due to severe bacterial sepsis or septic shock. (NOTE)       Sepsis PCT Algorithm           Lower Respiratory Tract                                      Infection PCT Algorithm    ----------------------------     ----------------------------         PCT < 0.25 ng/mL                PCT < 0.10 ng/mL         Strongly encourage             Strongly discourage   discontinuation of antibiotics    initiation of antibiotics    ----------------------------     -----------------------------       PCT 0.25 - 0.50 ng/mL            PCT 0.10 - 0.25 ng/mL               OR       >80% decrease in PCT            Discourage initiation of  antibiotics      Encourage discontinuation           of antibiotics    ----------------------------     -----------------------------         PCT >= 0.50 ng/mL              PCT 0.26 - 0.50 ng/mL                AND       <80% decrease in PCT              Encourage initiation of                                             antibiotics       Encourage continuation           of antibiotics    ----------------------------     -----------------------------        PCT >= 0.50 ng/mL                  PCT > 0.50 ng/mL               AND         increase in PCT                  Strongly encourage                                      initiation of antibiotics    Strongly encourage escalation           of antibiotics                                     -----------------------------                                           PCT <= 0.25 ng/mL                                                 OR                                        > 80% decrease in PCT                                     Discontinue / Do not initiate                                             antibiotics Performed at [facility], [place] [address] [place], [place] [address]   Lactic acid, plasma     Status: Abnormal   Collection Time:  05/01/17  7:54 PM  Result Value Ref Range   Lactic Acid, Venous 5.9 (HH) 0.5 - 1.9 mmol/L    Comment: CRITICAL RESULT CALLED TO, READ BACK BY AND VERIFIED WITH: CUMMINGS J,RN 05/01/17 2125 WAYK Performed at [place] [address] [place], [place] [address]   CK     Status: Abnormal   Collection Time: 05/01/17  7:54 PM  Result Value Ref Range   Total CK 2,933 (H) 49 - 397 U/L    Comment: Performed at [facility], [place] [address], [place], [place] [address]  Ethanol     Status: None   Collection Time: 05/01/17  7:54 PM  Result Value Ref Range   Alcohol, Ethyl (B) <10 <10 mg/dL    Comment:        LOWEST DETECTABLE LIMIT FOR SERUM ALCOHOL IS 10 mg/dL FOR MEDICAL PURPOSES ONLY Performed at [facility], [place] [address] [place], [place] [address]   Troponin I (q 6hr x 3)     Status: Abnormal   Collection Time: 05/01/17  7:54 PM  Result Value Ref Range   Troponin I 2.83 (HH) <0.03 ng/mL    Comment:  CRITICAL RESULT CALLED TO, READ BACK BY AND VERIFIED WITH: CUMMINGS B,RN 05/01/17 2157 WAYK Performed at [facility], [place] [address] [place], [place] [address]   Brain natriuretic peptide     Status: Abnormal   Collection Time: 05/01/17  7:54 PM  Result Value Ref Range   B Natriuretic Peptide 208.4 (H) 0.0 - 100.0 pg/mL    Comment: Performed at [place] [address], [place], [place] [address]  Hemoglobin A1c     Status: Abnormal   Collection Time: 05/01/17  7:54 PM  Result Value Ref Range   Hgb A1c MFr Bld 6.4 (H) 4.8 - 5.6 %    Comment: (NOTE) Pre diabetes:          5.7%-6.4% Diabetes:              >6.4% Glycemic control for   <7.0% adults with diabetes    Mean Plasma Glucose 136.98 mg/dL    Comment: Performed at [place] [address], [place], [place] [address]  CBC     Status: Abnormal   Collection Time: 05/01/17  7:54 PM  Result Value Ref Range   WBC 18.9 (H) 4.0 - 10.5 K/uL   RBC 4.11 (L) 4.22 - 5.81 MIL/uL   Hemoglobin 11.8 (L) 13.0 - 17.0 g/dL   HCT 36.9 (L) 39.0 - 52.0 %   MCV 89.8 78.0 - 100.0 fL   MCH 28.7 26.0 - 34.0 pg   MCHC 32.0 30.0 - 36.0 g/dL   RDW 13.7 11.5 - 15.5 %   Platelets 96 (L) 150 - 400 K/uL    Comment: DELTA CHECK NOTED REPEATED TO VERIFY PLATELET COUNT CONFIRMED BY SMEAR Performed at [facility], [place] [address], [place], [place] [address]   Creatinine, serum     Status: Abnormal   Collection Time: 05/01/17  7:54 PM  Result Value Ref Range   Creatinine, Ser 3.35 (H) 0.61 - 1.24 mg/dL   GFR calc non Af Amer 18 (L) >60 mL/min   GFR calc Af Amer 21 (L) >60 mL/min    Comment: (NOTE) The eGFR has been calculated using the CKD EPI equation. This calculation has not been validated in all clinical situations. eGFR's persistently <60 mL/min signify possible Chronic Kidney  Disease. Performed at [facility], [place] [address], [place], [place] [address]   D-dimer, quantitative (not at [facility])     Status:  Abnormal   Collection Time: 05/01/17  7:54 PM  Result Value Ref Range   D-Dimer, Quant >20.00 (H) 0.00 - 0.50 ug/mL-FEU    Comment: REPEATED TO VERIFY (NOTE) At the manufacturer cut-off of 0.50 ug/mL FEU, this assay has been documented to exclude PE with a sensitivity and negative predictive value of 97 to 99%.  At this time, this assay has not been approved by the FDA to exclude DVT/VTE. Results should be correlated with clinical presentation. Performed at [facility], [place] [address], [place], [place] [address]   Glucose, capillary     Status: Abnormal   Collection Time: 05/01/17  8:14 PM  Result Value Ref Range   Glucose-Capillary 159 (H) 65 - 99 mg/dL   Comment 1 Capillary Specimen    Comment 2 Notify RN   I-STAT 3, arterial blood gas (G3+)     Status: Abnormal   Collection Time: 05/01/17  9:19 PM  Result Value Ref Range   pH, Arterial 7.436 7.350 - 7.450   pCO2 arterial 21.6 (L) 32.0 - 48.0 mmHg   pO2, Arterial 55.0 (L) 83.0 - 108.0 mmHg   Bicarbonate 14.6 (L) 20.0 - 28.0 mmol/L   TCO2 15 (L) 22 - 32 mmol/L   O2 Saturation 91.0 %   Acid-base deficit 8.0 (H) 0.0 - 2.0 mmol/L   Patient temperature 97.8 F    Collection site FEMORAL ARTERY    Sample type ARTERIAL   Glucose, capillary     Status: Abnormal   Collection Time: 05/02/17 12:00 AM  Result Value Ref Range   Glucose-Capillary 160 (H) 65 - 99 mg/dL   Comment 1 Capillary Specimen    Comment 2 Notify RN   CBC     Status: Abnormal   Collection Time: 05/02/17  2:37 AM  Result Value Ref Range   WBC 13.6 (H) 4.0 - 10.5 K/uL   RBC 3.38 (L) 4.22 - 5.81 MIL/uL   Hemoglobin 9.6 (L) 13.0 - 17.0 g/dL   HCT 30.3 (L) 39.0 - 52.0 %   MCV 89.6 78.0 - 100.0 fL   MCH 28.4 26.0 - 34.0 pg   MCHC 31.7 30.0 - 36.0 g/dL   RDW 13.7 11.5 - 15.5 %   Platelets 88 (L) 150 - 400 K/uL    Comment: CONSISTENT WITH PREVIOUS RESULT Performed at [facility], [place] [address], [place], [place] [address]   Lipid panel     Status:  Abnormal   Collection Time: 05/02/17  2:37 AM  Result Value Ref Range   Cholesterol 61 0 - 200 mg/dL   Triglycerides 51 <150 mg/dL   HDL 10 (L) >40 mg/dL   Total CHOL/HDL Ratio 6.1 RATIO   VLDL 10 0 - 40 mg/dL   LDL Cholesterol 41 0 - 99 mg/dL    Comment:        Total Cholesterol/HDL:CHD Risk Coronary Heart Disease Risk Table                     Men   Women  1/2 Average Risk   3.4   3.3  Average Risk       5.0   4.4  2 X Average Risk   9.6   7.1  3 X Average Risk  23.4   11.0  Use the calculated Patient Ratio above and the CHD Risk Table to determine the patient's CHD Risk.        ATP III CLASSIFICATION (LDL):  <100     mg/dL   Optimal  100-129  mg/dL   Near or Above                    Optimal  130-159  mg/dL   Borderline  160-189  mg/dL   High  >190     mg/dL   Very High Performed at [facility] [address] [place], [place] [address]   Magnesium     Status: None   Collection Time: 05/02/17  2:37 AM  Result Value Ref Range   Magnesium 1.9 1.7 - 2.4 mg/dL    Comment: Performed at [facility] [address], [place], [place] [address]  Procalcitonin     Status: None   Collection Time: 05/02/17  2:37 AM  Result Value Ref Range   Procalcitonin 19.69 ng/mL    Comment:        Interpretation: PCT >= 10 ng/mL: Important systemic inflammatory response, almost exclusively due to severe bacterial sepsis or septic shock. (NOTE)       Sepsis PCT Algorithm           Lower Respiratory Tract                                      Infection PCT Algorithm    ----------------------------     ----------------------------         PCT < 0.25 ng/mL                PCT < 0.10 ng/mL         Strongly encourage             Strongly discourage   discontinuation of antibiotics    initiation of antibiotics    ----------------------------     -----------------------------       PCT 0.25 - 0.50 ng/mL            PCT 0.10 - 0.25 ng/mL               OR       >80% decrease  in PCT            Discourage initiation of                                            antibiotics      Encourage discontinuation           of antibiotics    ----------------------------     -----------------------------         PCT >= 0.50 ng/mL              PCT 0.26 - 0.50 ng/mL                AND       <80% decrease in PCT             Encourage initiation of  antibiotics       Encourage continuation           of antibiotics    ----------------------------     -----------------------------        PCT >= 0.50 ng/mL                  PCT > 0.50 ng/mL               AND         increase in PCT                  Strongly encourage                                      initiation of antibiotics    Strongly encourage escalation           of antibiotics                                     -----------------------------                                           PCT <= 0.25 ng/mL                                                 OR                                        > 80% decrease in PCT                                     Discontinue / Do not initiate                                             antibiotics Performed at [facility], [address] [place], [place] [address]   Phosphorus     Status: Abnormal   Collection Time: 05/02/17  2:37 AM  Result Value Ref Range   Phosphorus 6.2 (H) 2.5 - 4.6 mg/dL    Comment: Performed at [facility] [address] [place], [place] [address]  Troponin I     Status: Abnormal   Collection Time: 05/02/17  2:37 AM  Result Value Ref Range   Troponin I 5.30 (HH) <0.03 ng/mL    Comment: CRITICAL VALUE NOTED.  VALUE IS CONSISTENT WITH PREVIOUSLY REPORTED AND CALLED VALUE. Performed at [facility], [place] [address], [place], [place] [address]   TSH     Status: None   Collection Time: 05/02/17  2:37 AM  Result Value Ref Range   TSH 1.304 0.350 - 4.500 uIU/mL    Comment: Performed by a 3rd  Generation assay with a functional sensitivity of <=0.01 uIU/mL. Performed at [facility], [place] [address] [place], [place] [address]   Heparin level (unfractionated)     Status: Abnormal   Collection Time: 05/02/17  2:46 AM  Result Value Ref Range   Heparin Unfractionated 0.10 (L) 0.30 - 0.70 IU/mL    Comment:        IF HEPARIN RESULTS ARE BELOW EXPECTED VALUES, AND PATIENT DOSAGE HAS BEEN CONFIRMED, SUGGEST FOLLOW UP TESTING OF ANTITHROMBIN III LEVELS. Performed at [facility], [place] [address] [place], [place] [address]   Glucose, capillary     Status: Abnormal   Collection Time: 05/02/17  4:16 AM  Result Value Ref Range   Glucose-Capillary 138 (H) 65 - 99 mg/dL   Comment 1 Capillary Specimen    Comment 2 Notify RN   I-STAT 3, arterial blood gas (G3+)     Status: Abnormal   Collection Time: 05/02/17  4:19 AM  Result Value Ref Range   pH, Arterial 7.260 (L) 7.350 - 7.450   pCO2 arterial 47.2 32.0 - 48.0 mmHg   pO2, Arterial 349.0 (H) 83.0 - 108.0 mmHg   Bicarbonate 21.2 20.0 - 28.0 mmol/L   TCO2 23 22 - 32 mmol/L   O2 Saturation 100.0 %   Acid-base deficit 6.0 (H) 0.0 - 2.0 mmol/L   Patient temperature 98.6 F    Collection site FEMORAL ARTERY    Sample type ARTERIAL   Glucose, capillary     Status: Abnormal   Collection Time: 05/02/17  8:05 AM  Result Value Ref Range   Glucose-Capillary 123 (H) 65 - 99 mg/dL   Comment 1 Capillary Specimen    Comment 2 Notify RN   Heparin level (unfractionated)     Status: Abnormal   Collection Time: 05/02/17  9:00 AM  Result Value Ref Range   Heparin Unfractionated 0.29 (L) 0.30 - 0.70 IU/mL    Comment:        IF HEPARIN RESULTS ARE BELOW EXPECTED VALUES, AND PATIENT DOSAGE HAS BEEN CONFIRMED, SUGGEST FOLLOW UP TESTING OF ANTITHROMBIN III LEVELS. Performed at [facility], [place] [address], [place], [place] [address]   Lactic acid, plasma     Status: None   Collection Time: 05/02/17 10:49 AM  Result Value Ref  Range   Lactic Acid, Venous 1.5 0.5 - 1.9 mmol/L    Comment: Performed at [facility] [address] [place], [place] [address]  Troponin I (q 6hr x 3)     Status: Abnormal   Collection Time: 05/02/17 10:49 AM  Result Value Ref Range   Troponin I 5.65 (HH) <0.03 ng/mL    Comment: CRITICAL RESULT CALLED TO, READ BACK BY AND VERIFIED WITH: L.BASS,RN 1158 05/02/17 CLARK,S Performed at [facility], [place] [address], [place], [place] [address]   Comprehensive metabolic panel     Status: Abnormal   Collection Time: 05/02/17 10:49 AM  Result Value Ref Range   Sodium 144 135 - 145 mmol/L   Potassium 6.5 (HH) 3.5 - 5.1 mmol/L    Comment: CRITICAL RESULT CALLED TO, READ BACK BY AND VERIFIED WITH: L.BASS,RN 1158 05/02/17 CLARK,S NO VISIBLE HEMOLYSIS    Chloride 115 (H) 101 - 111 mmol/L   CO2 14 (L) 22 - 32 mmol/L   Glucose, Bld 121 (H) 65 - 99 mg/dL   BUN 82 (H) 6 - 20 mg/dL   Creatinine, Ser 4.97 (H) 0.61 - 1.24 mg/dL   Calcium 7.4 (L) 8.9 - 10.3 mg/dL   Total Protein  6.0 (L) 6.5 - 8.1 g/dL   Albumin 1.9 (L) 3.5 - 5.0 g/dL   AST 2,228 (H) 15 - 41 U/L   ALT 957 (H) 17 - 63 U/L   Alkaline Phosphatase 63 38 - 126 U/L   Total Bilirubin 0.8 0.3 - 1.2 mg/dL   GFR calc non Af Amer 11 (L) >60 mL/min   GFR calc Af Amer 13 (L) >60 mL/min    Comment: (NOTE) The eGFR has been calculated using the CKD EPI equation. This calculation has not been validated in all clinical situations. eGFR's persistently <60 mL/min signify possible Chronic Kidney Disease.    Anion gap 15 5 - 15    Comment: Performed at [facility] [address], [place], [place] [address]  Glucose, capillary     Status: Abnormal   Collection Time: 05/02/17 11:46 AM  Result Value Ref Range   Glucose-Capillary 102 (H) 65 - 99 mg/dL   Comment 1 Capillary Specimen   Glucose, capillary     Status: None   Collection Time: 05/02/17  3:22 PM  Result Value Ref Range   Glucose-Capillary 82 65 - 99 mg/dL   Comment  1 Capillary Specimen    Dg Chest Port 1 View  Result Date: 05/01/2017 CLINICAL DATA:  Endotracheal tube placement.  OG tube placement. EXAM: PORTABLE CHEST 1 VIEW COMPARISON:  Radiograph earlier this day at 1131 hour FINDINGS: Endotracheal tube tip 3.7 cm from the carina. Enteric tube in place, tip below the diaphragm in the stomach, side-port just at the gastroesophageal junction. Low lung volumes persist. Peripheral rounded density in the right upper lung zone subjacent to the lateral third rib. Developing patchy opacity in the right lung base. Calcified granulomas in the left lung. Unchanged heart size and mediastinal contours. No large pleural effusion. No pneumothorax. No acute osseous abnormalities are seen. IMPRESSION: 1. Endotracheal tube tip 3.7 cm from the carina. 2. Tip of the enteric tube below the diaphragm in the stomach, side-port just at the esophageal junction, consider advancement of 2-3 cm for optimal placement. 3. Persistent low lung volumes. Peripheral rounded density in the right upper lung zone may be developing pneumonia, however recommend radiographic follow-up to resolution to exclude underlying mass. Increasing patchy opacity at the right lung base, atelectasis or aspiration. Electronically Signed   By: Jeb Levering M.D.   On: 05/01/2017 23:08   Dg Chest Port 1 View  Result Date: 05/01/2017 CLINICAL DATA:  Altered mental status, hypertension, tachypnea. EXAM: PORTABLE CHEST 1 VIEW COMPARISON:  PA and lateral chest x-ray of July 08, 2015 FINDINGS: The lungs are mildly hypoinflated but clear. The heart and pulmonary vascularity are normal. The mediastinum is normal in width. There is no pleural effusion. The bony thorax is unremarkable. IMPRESSION: Mild hypoinflation.  No acute cardiopulmonary abnormality. Electronically Signed   By: David  Martinique M.D.   On: 05/01/2017 11:48   Vas [place] Lower Extremity Venous (dvt)  Result Date: 05/02/2017  Lower Venous Study Indication:  Swelling, and Erythema. Risk Factors: DVT history. Examination Guidelines: A complete evaluation includes B-mode imaging, spectral doppler, color doppler, and power doppler as needed of all accessible portions of each vessel. Bilateral testing is considered an integral part of a complete examination. Limited examinations for reoccurring indications may be performed as noted.  Right Venous Findings: +---------+---------------+---------+-----------+----------+-------+          CompressibilityPhasicitySpontaneityPropertiesSummary +---------+---------------+---------+-----------+----------+-------+ CFV      Full                                                 +---------+---------------+---------+-----------+----------+-------+  FV Prox  Full                                                 +---------+---------------+---------+-----------+----------+-------+ FV Mid   Full                                                 +---------+---------------+---------+-----------+----------+-------+ FV DistalFull                                                 +---------+---------------+---------+-----------+----------+-------+ PFV      Full                                                 +---------+---------------+---------+-----------+----------+-------+ POP      Full                                                 +---------+---------------+---------+-----------+----------+-------+ PTV      None                               dilated   Acute   +---------+---------------+---------+-----------+----------+-------+ PERO     None                               dilated   Acute   +---------+---------------+---------+-----------+----------+-------+  Left Venous Findings: +---------+---------------+---------+-----------+----------+-------+          CompressibilityPhasicitySpontaneityPropertiesSummary +---------+---------------+---------+-----------+----------+-------+ CFV       Full           Yes      Yes                          +---------+---------------+---------+-----------+----------+-------+ FV Prox  None                               dilated   Acute   +---------+---------------+---------+-----------+----------+-------+ FV Mid   None                               dilated   Acute   +---------+---------------+---------+-----------+----------+-------+ FV DistalNone                               dilated   Acute   +---------+---------------+---------+-----------+----------+-------+ PFV      Full                                                 +---------+---------------+---------+-----------+----------+-------+  POP      None           No       No         dilated   Acute   +---------+---------------+---------+-----------+----------+-------+ PTV      None                               dilated   Acute   +---------+---------------+---------+-----------+----------+-------+ PERO     None                               dilated   Acute   +---------+---------------+---------+-----------+----------+-------+    Final Interpretation: Right: There is evidence of acute DVT in the Posterior Tibial veins, and Peroneal veins. Left: There is evidence of acute DVT in the Femoral vein, Popliteal vein, Posterior Tibial veins, and Peroneal veins.  *See table(s) above for measurements and observations. Electronically signed by Servando Snare on 05/02/2017 at 3:18:26 PM.  Final   Assessment:  1 Oliguric AKI, hemodynamically mediated due to shock 2 Shock liver 3 Hyperkalemia 4 Hypovolemia 5 DVT, bilat 6 Staphylococcus ludg. UTI  Plan: 1 volume expand with bicarb 2 kayexalate  Estanislado Emms 05/02/2017, 3:55 PM

## 2017-05-02 NOTE — Progress Notes (Signed)
ANTICOAGULATION CONSULT NOTE - Follow- Up Consult  Pharmacy Consult for Heparin  Indication: chest pain/ACS / new BL DVTs  No Known Allergies  Patient Measurements: Height: 5\' 10"  (177.8 cm) Weight: 184 lb 8.4 oz (83.7 kg) IBW/kg (Calculated) : 73  Vital Signs: Temp: 98.1 F (36.7 C) (02/14 1535) Temp Source: Oral (02/14 1535) BP: 91/57 (02/14 1615) Pulse Rate: 95 (02/14 1615)  Labs: Recent Labs    05/01/17 1110 05/01/17 1954 05/02/17 0237 05/02/17 0246 05/02/17 0900 05/02/17 1049 05/02/17 1846  HGB 13.1 11.8* 9.6*  --   --   --   --   HCT 41.3 36.9* 30.3*  --   --   --   --   PLT 181 96* 88*  --   --   --   --   HEPARINUNFRC  --   --   --  0.10* 0.29*  --  0.57  CREATININE 2.50* 3.35*  --   --   --  4.97*  --   CKTOTAL  --  2,933*  --   --   --   --   --   TROPONINI  --  2.83* 5.30*  --   --  5.65*  --     Estimated Creatinine Clearance: 15.1 mL/min (A) (by C-G formula based on SCr of 4.97 mg/dL (H)).   Assessment: [name] in the ICU after being "found down". His troponin is elevated. Starting heparin per pharmacy for possible ACS and new BL DVTs found today.  Heparin drip 1200 uts/hr HL 0.57 at goal  CBC and platelets are down but no signs of bleeding.   Goal of Therapy:  Heparin level 0.3-0.7 units/ml Monitor platelets by anticoagulation protocol: Yes   Plan:  Continue Heparin drip 1200 units/hr Daily CBC/HL Monitor for bleeding   Leota SauersLisa Rieley Khalsa Pharm.D. CPP, BCPS Clinical Pharmacist [phone] 05/02/2017 8:26 PM

## 2017-05-02 NOTE — Progress Notes (Signed)
Initial Nutrition Assessment  DOCUMENTATION CODES:   Non-severe (moderate) malnutrition in context of social or environmental circumstances  INTERVENTION:    Vital AF 1.2 at 75 ml/h (1800 ml per day)   Provides 2160 kcal, 135 gm protein, 1460 ml free water daily   Monitor magnesium, potassium, and phosphorus daily for at least 3 days, MD to replete as needed, as pt is at risk for refeeding syndrome given moderate malnutrition and questionable intake recently.  NUTRITION DIAGNOSIS:   Moderate Malnutrition related to social / environmental circumstances as evidenced by mild fat depletion, mild muscle depletion.  GOAL:   Patient will meet greater than or equal to 90% of their needs  MONITOR:   Vent status, TF tolerance, Labs, I & O's  REASON FOR ASSESSMENT:   Ventilator, Rounds(RD to order TF)    ASSESSMENT:   67 yo male with no PMH who was admitted on 2/13 after being found unresponsive outside by police with hypothermia, hypotension, elevated lactic acid. Required intubation on admission. Found to have extensive bilateral DVTs.  Discussed patient in ICU rounds and with RN today. Patient is suspected to be homeless. Patient will not be extubated today. Okay for RD to order TF.  Suspect limited access to food given homelessness and mild depletion of muscle and subcutaneous fat mass. Patient is currently intubated on ventilator support MV: 17.4 L/min Temp (24hrs), Avg:98.1 F (36.7 C), Min:95.6 F (35.3 C), Max:101.3 F (38.5 C)  Labs reviewed. Potassium 6.5 (H) CBG's: 160-138-123-102 Medications reviewed and include sodium bicarbonate, Kayexalate, thiamine.  NUTRITION - FOCUSED PHYSICAL EXAM:    Most Recent Value  Orbital Region  Mild depletion  Upper Arm Region  Unable to assess  Thoracic and Lumbar Region  Unable to assess  Buccal Region  Mild depletion  Temple Region  Mild depletion  Clavicle Bone Region  Mild depletion  Clavicle and Acromion Bone Region   Mild depletion  Scapular Bone Region  Unable to assess  Dorsal Hand  No depletion  Patellar Region  No depletion  Anterior Thigh Region  No depletion  Posterior Calf Region  No depletion  Edema (RD Assessment)  Severe  Hair  Reviewed  Eyes  Unable to assess  Mouth  Unable to assess  Skin  Reviewed  Nails  Reviewed       Diet Order:  Diet NPO time specified  EDUCATION NEEDS:   No education needs have been identified at this time  Skin:  Skin Assessment: Reviewed RN Assessment  Last BM:  unknown  Height:   Ht Readings from Last 1 Encounters:  05/01/17 5\' 10"  (1.778 m)    Weight:   Wt Readings from Last 1 Encounters:  05/02/17 184 lb 8.4 oz (83.7 kg)    Ideal Body Weight:  75.5 kg  BMI:  Body mass index is 26.48 kg/m.  Estimated Nutritional Needs:   Kcal:  2100  Protein:  125-150 gm  Fluid:  2.1 L   Joaquin CourtsKimberly Harris, RD, LDN, CNSC Pager [phone] After Hours Pager [phone]

## 2017-05-02 NOTE — Progress Notes (Signed)
PHARMACY - PHYSICIAN COMMUNICATION CRITICAL VALUE ALERT - BLOOD CULTURE IDENTIFICATION (BCID)  Results for orders placed or performed during the hospital encounter of 05/01/17  Blood Culture ID Panel (Reflexed) (Collected: 05/01/2017 11:00 AM)  Result Value Ref Range   Enterococcus species NOT DETECTED NOT DETECTED   Listeria monocytogenes NOT DETECTED NOT DETECTED   Staphylococcus species NOT DETECTED NOT DETECTED   Staphylococcus aureus NOT DETECTED NOT DETECTED   Streptococcus species NOT DETECTED NOT DETECTED   Streptococcus agalactiae NOT DETECTED NOT DETECTED   Streptococcus pneumoniae NOT DETECTED NOT DETECTED   Streptococcus pyogenes NOT DETECTED NOT DETECTED   Acinetobacter baumannii NOT DETECTED NOT DETECTED   Enterobacteriaceae species NOT DETECTED NOT DETECTED   Enterobacter cloacae complex NOT DETECTED NOT DETECTED   Escherichia coli NOT DETECTED NOT DETECTED   Klebsiella oxytoca NOT DETECTED NOT DETECTED   Klebsiella pneumoniae NOT DETECTED NOT DETECTED   Proteus species NOT DETECTED NOT DETECTED   Serratia marcescens NOT DETECTED NOT DETECTED   Haemophilus influenzae NOT DETECTED NOT DETECTED   Neisseria meningitidis NOT DETECTED NOT DETECTED   Pseudomonas aeruginosa NOT DETECTED NOT DETECTED   Candida albicans NOT DETECTED NOT DETECTED   Candida glabrata NOT DETECTED NOT DETECTED   Candida krusei NOT DETECTED NOT DETECTED   Candida parapsilosis NOT DETECTED NOT DETECTED   Candida tropicalis NOT DETECTED NOT DETECTED    Name of physician (or Provider) Contacted: [facility]- FM Resident with CCM   Changes to prescribed antibiotics required: Gm negative coccobacilli and gm positive rods in 1/2 cultures. Patient currently on broad coverage with Vancomycin and Zosyn. Would leave for now until further data emerges.   Sharin MonsEmily Sinclair, PharmD, BCPS PGY2 Infectious Diseases Pharmacy Resident Pager: [phone]  05/02/2017  3:46 PM

## 2017-05-02 NOTE — Progress Notes (Signed)
ANTICOAGULATION CONSULT NOTE - Follow- Up Consult  Pharmacy Consult for Heparin  Indication: chest pain/ACS  No Known Allergies  Patient Measurements: Height: 5\' 10"  (177.8 cm) Weight: 184 lb 8.4 oz (83.7 kg) IBW/kg (Calculated) : 73  Vital Signs: Temp: 99.8 F (37.7 C) (02/14 0834) Temp Source: Oral (02/14 0834) BP: 100/84 (02/14 0900) Pulse Rate: 97 (02/14 0900)  Labs: Recent Labs    05/01/17 1110 05/01/17 1954 05/02/17 0237 05/02/17 0246 05/02/17 0900  HGB 13.1 11.8* 9.6*  --   --   HCT 41.3 36.9* 30.3*  --   --   PLT 181 96* 88*  --   --   HEPARINUNFRC  --   --   --  0.10* 0.29*  CREATININE 2.50* 3.35*  --   --   --   CKTOTAL  --  2,933*  --   --   --   TROPONINI  --  2.83* 5.30*  --   --     Estimated Creatinine Clearance: 22.4 mL/min (A) (by C-G formula based on SCr of 3.35 mg/dL (H)).   Assessment: [name] in the ICU after being "found down". His troponin is elevated. Starting heparin per pharmacy for possible ACS. Level this AM is slightly subtherapeutic at 0.29 on 1100 units/hr.  CBC and platelets are down but no signs of bleeding.   Goal of Therapy:  Heparin level 0.3-0.7 units/ml Monitor platelets by anticoagulation protocol: Yes   Plan:  Increase Heparin drip to 1200 units/hr 8 hour heparin level Daily CBC/HL Monitor for bleeding   Sharin MonsEmily Raliyah Montella, PharmD, BCPS PGY2 Infectious Diseases Pharmacy Resident Pager: [phone]  05/02/2017,10:07 AM

## 2017-05-02 NOTE — Progress Notes (Signed)
  Echocardiogram 2D Echocardiogram has been performed.  Roosvelt MaserLane, Davon Folta F 05/02/2017, 3:58 PM

## 2017-05-02 NOTE — Consult Note (Signed)
Cardiology Consult    Patient ID: Joseph Downs MRN: [id number], DOB/AGE: 1951-03-15   Admit date: 05/01/2017 Date of Consult: 05/02/2017  Primary Physician: Patient, No Pcp Per Primary Cardiologist: New Requesting Provider: Dr. Lamonte Sakai Reason for Consultation: Elevated Troponin  Joseph Downs is a 67 y.o. male who is being seen today for the evaluation of elevated troponin at the request of Dr. Lamonte Sakai.  Patient Profile    67 yo male with no known PMH, homeless who was found down outside unresponsive. Noted to be tachycardic and hypotensive, requiring intubation with elevated troponins.   Past Medical History   No past medical history on file.  No past surgical history on file.   Allergies  No Known Allergies  History of Present Illness    Joseph Downs is a 67 yo male with no known PMH, currently homeless who was found down by [employer] and brought to the ED. He was breathing on his own but initially unresponsive. In the ED was found to be hypothermic, able to awaken and follow some commands. He was fluid resuscitated with 3.5L of IVFs and placed on the bear hugger. Eventually was able to wake up and reported having a syncopal episode, but unable to recall the events surrounding this. Labs on admission showed stable electrolytes, Cr 2.5, Albumin 2.3, WBC 15.6, Lactic acid 7.85, BNP 208, CK 2933, procalcitonin 14.36. EKG showed SR with PVCs and slight diffuse ST depressions. He was started on IV broad spectrum antibiotics and admitted to [facility] service given his elevated lactic post IV resuscitation. He was admitted to ICU and last evening became acutely hypoxic requiring intubation. CXR with no overt signs of edema, but suggestive of possible RUL PNA.    Follow up labs today showed rising troponin of 5.30>>5.65, K+ 6.5, Cr 3.35>> 4.97, AST 2228, ALT 957. Venous dopplers showed extensive bilateral LE DVTs with the left leg involving the femoral, popliteral and calf veins. He has been started  on IV heparin.   Inpatient Medications    . aspirin  324 mg Per Tube Daily  . chlorhexidine gluconate (MEDLINE KIT)  15 mL Mouth Rinse BID  . insulin aspart  0-9 Units Subcutaneous TID WC  . ipratropium-albuterol  3 mL Nebulization Q6H  . mouth rinse  15 mL Mouth Rinse QID  . pantoprazole (PROTONIX) IV  40 mg Intravenous QHS  . thiamine injection  100 mg Intravenous Daily    Family History    Family History  Problem Relation Age of Onset  . Cancer Father     Social History    Social History   Socioeconomic History  . Marital status: Married    Spouse name: Not on file  . Number of children: Not on file  . Years of education: Not on file  . Highest education level: Not on file  Social Needs  . Financial resource strain: Not on file  . Food insecurity - worry: Not on file  . Food insecurity - inability: Not on file  . Transportation needs - medical: Not on file  . Transportation needs - non-medical: Not on file  Occupational History  . Not on file  Tobacco Use  . Smoking status: Never Smoker  . Smokeless tobacco: Never Used  Substance and Sexual Activity  . Alcohol use: No  . Drug use: No  . Sexual activity: Not on file  Other Topics Concern  . Not on file  Social History Narrative  . Not on file  Review of Systems    See HPI  All other systems reviewed and are otherwise negative except as noted above.  Physical Exam    Blood pressure (!) 85/63, pulse 95, temperature 98.1 F (36.7 C), temperature source Oral, resp. rate 12, height _0  (1.778 m), weight 184 lb 8.4 oz (83.7 kg), SpO2 98 %.  General: Intubated, sedated Neuro: Intubated, sedated.  HEENT: ETT in placed Neck: Supple without bruits or JVD. Lungs:  Resp regular and unlabored, course bilateral breath sounds Heart: RRR no s3, s4, or murmurs. Abdomen: Soft, non-tender, non-distended, BS + x 4.  Extremities: No clubbing, cyanosis, 2+ pitting LE edema.  Labs    Troponin (Point of Care  Test) No results for input(s): TROPIPOC in the last 72 hours. Recent Labs    05/01/17 1954 05/02/17 0237 05/02/17 1049  CKTOTAL 2,933*  --   --   TROPONINI 2.83* 5.30* 5.65*   Lab Results  Component Value Date   WBC 13.6 (H) 05/02/2017   HGB 9.6 (L) 05/02/2017   HCT 30.3 (L) 05/02/2017   MCV 89.6 05/02/2017   PLT 88 (L) 05/02/2017    Recent Labs  Lab 05/02/17 1049  NA 144  K 6.5*  CL 115*  CO2 14*  BUN 82*  CREATININE 4.97*  CALCIUM 7.4*  PROT 6.0*  BILITOT 0.8  ALKPHOS 63  ALT 957*  AST 2,228*  GLUCOSE 121*   Lab Results  Component Value Date   CHOL 61 05/02/2017   HDL 10 (L) 05/02/2017   LDLCALC 41 05/02/2017   TRIG 51 05/02/2017   Lab Results  Component Value Date   DDIMER >20.00 (H) 05/01/2017     Radiology Studies    Dg Chest Port 1 View  Result Date: 05/01/2017 CLINICAL DATA:  Endotracheal tube placement.  OG tube placement. EXAM: PORTABLE CHEST 1 VIEW COMPARISON:  Radiograph earlier this day at 1131 hour FINDINGS: Endotracheal tube tip 3.7 cm from the carina. Enteric tube in place, tip below the diaphragm in the stomach, side-port just at the gastroesophageal junction. Low lung volumes persist. Peripheral rounded density in the right upper lung zone subjacent to the lateral third rib. Developing patchy opacity in the right lung base. Calcified granulomas in the left lung. Unchanged heart size and mediastinal contours. No large pleural effusion. No pneumothorax. No acute osseous abnormalities are seen. IMPRESSION: 1. Endotracheal tube tip 3.7 cm from the carina. 2. Tip of the enteric tube below the diaphragm in the stomach, side-port just at the esophageal junction, consider advancement of 2-3 cm for optimal placement. 3. Persistent low lung volumes. Peripheral rounded density in the right upper lung zone may be developing pneumonia, however recommend radiographic follow-up to resolution to exclude underlying mass. Increasing patchy opacity at the right lung  base, atelectasis or aspiration. Electronically Signed   By: Jeb Levering M.D.   On: 05/01/2017 23:08   Dg Chest Port 1 View  Result Date: 05/01/2017 CLINICAL DATA:  Altered mental status, hypertension, tachypnea. EXAM: PORTABLE CHEST 1 VIEW COMPARISON:  PA and lateral chest x-ray of July 08, 2015 FINDINGS: The lungs are mildly hypoinflated but clear. The heart and pulmonary vascularity are normal. The mediastinum is normal in width. There is no pleural effusion. The bony thorax is unremarkable. IMPRESSION: Mild hypoinflation.  No acute cardiopulmonary abnormality. Electronically Signed   By: David  Martinique M.D.   On: 05/01/2017 11:48    ECG & Cardiac Imaging    EKG:  The EKG was personally reviewed and  demonstrates SR with PVCs  Assessment & Plan    67 yo male with no known PMH, homeless who was found down outside unresponsive. Noted to be tachycardic and hypotensive, requiring intubation with elevated troponins.   1. Acute respiratory failure: Presented to the ED after unknown down time outside in the cold. Developed acute respiratory distress requiring intubation. Elevated lactic acid and procalcitonin. On broad spectrum antibiotics per PCCM management.   2. Elevated Troponin: Trop trending up with last reading at 5.65. Did report a syncopal episode on admission, but no reports of chest pain. PMH is unknown, currently homeless. EKG without acute ischemic changes. He is not a candidate for invasive testing at this time.  -- continue to trend troponins -- echo pending, wait results. Does have LE swelling, but no overt volume overload on exam.  -- on IV heparin -- no room for medical therapy with BB, ACEI/ARB 2/2 to hypotension requiring pressers and AKI.  3. AKI: Cr on admission 2.5 but now peaked to 4.9 today Little UOP noted.  -- nephrology consulted per PCCM  4. Leukocytosis: along with elevated lactic acid, and pro calcitonin. UA suggestive of UTI, and CXR with possible infiltrate?  In RUL. Antibiotic therapy per primary. Vanc has been stopped 2/2 to AKI.   5. Bilateral DVTs: noted on dopplers this afternoon. Given this finding, suspect strong possibility for PE. Not able to do diagnostic testing given Cr, not appropriate for VQ scan.   6. Hyperkalemia: 6.5, corrected per primary -- follow BMET  7. Elevated LFTs: AST/ALT normal on admission, now with significant elevation.  Barnet Pall, NP-C Pager [phone] 05/02/2017, 1:33 PM

## 2017-05-02 NOTE — Progress Notes (Signed)
CRITICAL VALUE ALERT  Critical Value:  K 6.5, Trop 5.65  Date & Time Notied:  05/02/17,  1200  Provider Notified: Dr Delton CoombesByrum  Orders Received/Actions taken: yes

## 2017-05-02 NOTE — Progress Notes (Signed)
PULMONARY / CRITICAL CARE MEDICINE   Name: Joseph Downs MRN: [id number] DOB: 05-01-1950    ADMISSION DATE:  05/01/2017 CONSULTATION DATE:  05/01/17  REFERRING MD:  Dr. Deretha Emory  CHIEF COMPLAINT:  Found down  HISTORY OF PRESENT ILLNESS:        This is a 67 year old man who was found down.  It should be noted that it is been in the 40s for the past 24 hours.  He was tachypneic and hypotensive as well as hypothermic on arrival with a lactic acid in excess of 7.  He has received 3.5 L of crystalloid resuscitation along with vancomycin and Zosyn and he has been warmed with a bear hugger.  He is now alert and able to communicate with me.  He tells me that he had a syncopal episode last night but is a little hazy on the exact details.  He is not aware that he had any palpitations preceding that episode and he denies a prior history of seizure disorder.  He denies any new pains suggestive of an unprotected fall.  He denies use of street drugs or alcohol. Critical care service is asked to admit the patient because he still has a lactate acid in excess of 6 after being warmed and fluid resuscitated. Patient was initiated on bipap but would not tolerate mask. He was intubated shortly after admission.   PAST MEDICAL HISTORY :  He  has no past medical history on file.  PAST SURGICAL HISTORY: He  has no past surgical history on file.  No Known Allergies  No current facility-administered medications on file prior to encounter.    Current Outpatient Medications on File Prior to Encounter  Medication Sig  . finasteride (PROSCAR) 5 MG tablet Take 5 mg by mouth daily.  . tamsulosin (FLOMAX) 0.4 MG CAPS capsule Take 1 capsule (0.4 mg total) by mouth daily after supper. (Patient not taking: Reported on 05/01/2017)    FAMILY HISTORY:  His indicated that his mother is alive. He indicated that his father is deceased.   SOCIAL HISTORY: He  reports that  has never smoked. he has never used smokeless  tobacco. He reports that he does not drink alcohol or use drugs.  REVIEW OF SYSTEMS:   Unable to obtain  SUBJECTIVE:  Intubated, sedated  VITAL SIGNS: BP 96/73   Pulse 99   Temp 98.5 F (36.9 C) (Oral)   Resp 10   Ht 5\' 10"  (1.778 m)   Wt 184 lb 8.4 oz (83.7 kg)   SpO2 98%   BMI 26.48 kg/m   HEMODYNAMICS:    VENTILATOR SETTINGS: Vent Mode: CPAP;PSV FiO2 (%):  [40 %-100 %] 40 % Set Rate:  [12 bmp-18 bmp] 18 bmp Vt Set:  [580 mL] 580 mL PEEP:  [5 cmH20] 5 cmH20 Pressure Support:  [5 cmH20] 5 cmH20  INTAKE / OUTPUT: I/O last 3 completed shifts: In: 6771.2 [I.V.:2121.2; IV Piggyback:4650] Out: 30 [Urine:30]  PHYSICAL EXAMINATION: General:  Ill appearing man laying in bed, sedated, intubated Neuro:  sedated HEENT:  ETT in place, MMM Cardiovascular:  RRR, no MRG Lungs:  Coarse breath sounds bilaterally Abdomen:  Soft, non-distended, +BS Musculoskeletal:  +3 pitting edema LE bilaterally, left > right Skin:  In tact  LABS:  BMET Recent Labs  Lab 05/01/17 1110 05/01/17 1954  NA 145  --   K 4.1  --   CL 110  --   CO2 14*  --   BUN 58*  --   CREATININE  2.50* 3.35*  GLUCOSE 218*  --     Electrolytes Recent Labs  Lab 05/01/17 1110 05/02/17 0237  CALCIUM 8.7*  --   MG  --  1.9  PHOS  --  6.2*    CBC Recent Labs  Lab 05/01/17 1110 05/01/17 1954 05/02/17 0237  WBC 15.6* 18.9* 13.6*  HGB 13.1 11.8* 9.6*  HCT 41.3 36.9* 30.3*  PLT 181 96* 88*    Coag's No results for input(s): APTT, INR in the last 168 hours.  Sepsis Markers Recent Labs  Lab 05/01/17 1123 05/01/17 1301 05/01/17 1954 05/02/17 0237  LATICACIDVEN 7.85* 6.17* 5.9*  --   PROCALCITON  --   --  14.36 19.69    ABG Recent Labs  Lab 05/01/17 1159 05/01/17 2119 05/02/17 0419  PHART 7.353 7.436 7.260*  PCO2ART 23.1* 21.6* 47.2  PO2ART 51.0* 55.0* 349.0*    Liver Enzymes Recent Labs  Lab 05/01/17 1110  AST 81*  ALT 83*  ALKPHOS 75  BILITOT 0.9  ALBUMIN 2.3*     Cardiac Enzymes Recent Labs  Lab 05/01/17 1954 05/02/17 0237  TROPONINI 2.83* 5.30*    Glucose Recent Labs  Lab 05/01/17 2014 05/02/17 0000 05/02/17 0416  GLUCAP 159* 160* 138*    Imaging Dg Chest Port 1 View  Result Date: 05/01/2017 CLINICAL DATA:  Endotracheal tube placement.  OG tube placement. EXAM: PORTABLE CHEST 1 VIEW COMPARISON:  Radiograph earlier this day at 1131 hour FINDINGS: Endotracheal tube tip 3.7 cm from the carina. Enteric tube in place, tip below the diaphragm in the stomach, side-port just at the gastroesophageal junction. Low lung volumes persist. Peripheral rounded density in the right upper lung zone subjacent to the lateral third rib. Developing patchy opacity in the right lung base. Calcified granulomas in the left lung. Unchanged heart size and mediastinal contours. No large pleural effusion. No pneumothorax. No acute osseous abnormalities are seen. IMPRESSION: 1. Endotracheal tube tip 3.7 cm from the carina. 2. Tip of the enteric tube below the diaphragm in the stomach, side-port just at the esophageal junction, consider advancement of 2-3 cm for optimal placement. 3. Persistent low lung volumes. Peripheral rounded density in the right upper lung zone may be developing pneumonia, however recommend radiographic follow-up to resolution to exclude underlying mass. Increasing patchy opacity at the right lung base, atelectasis or aspiration. Electronically Signed   By: Rubye OaksMelanie  Ehinger M.D.   On: 05/01/2017 23:08   Dg Chest Port 1 View  Result Date: 05/01/2017 CLINICAL DATA:  Altered mental status, hypertension, tachypnea. EXAM: PORTABLE CHEST 1 VIEW COMPARISON:  PA and lateral chest x-ray of July 08, 2015 FINDINGS: The lungs are mildly hypoinflated but clear. The heart and pulmonary vascularity are normal. The mediastinum is normal in width. There is no pleural effusion. The bony thorax is unremarkable. IMPRESSION: Mild hypoinflation.  No acute cardiopulmonary  abnormality. Electronically Signed   By: David  SwazilandJordan M.D.   On: 05/01/2017 11:48   STUDIES:  CXR 2/13 - no acute cardiopulmonary abnormality  CULTURES: MRSA PCR negative Ucx pending Blood cx pending  ANTIBIOTICS: Vanc 2/13 > Zosyn 2/13>   SIGNIFICANT EVENTS: 2/13 admitted, intubated  LINES/TUBES: ETT 2/13 PIV Foley 2/13 OGT 2/13  DISCUSSION:      This is a 67 year old who was found hypothermic hypotensive and tachypneic and unresponsive in a cold environment.  After fluid resuscitating the patient and warming him he has a persistent lactic acidosis.  Both urine and chest exam suggest a source of infection.  In  addition there is concern as to why he became unresponsive and suffered from exposure to the cold.  He gives a history of what sounds like new onset congestive heart failure, there is concern for arrhythmic event.  ASSESSMENT / PLAN:  PULMONARY A: Acute respiratory failure P:   Continue vent support Wean support as able duonebs q6H  CARDIOVASCULAR A:  tropinemia Suspected CHF hypotension P:  Echo ordered Trend troponin Cards consulted On heparin drip Titrate levophed for MAP >65  RENAL A:   Elevated CK ?AKI- unclear baseline Cr Lactic acidosis P:   Fluid resuscitation w/ NS for elevated CK, Cr, LA Repeat CK tomorrow Trend LA Trend BMP  GASTROINTESTINAL A:   NPO Elevated LFTs P:   Tube feeds PPI IV Bowel regimen Follow LFTs  HEMATOLOGIC A:   Leukocytosis anemia P:  Trend CBC Transfusion threshold < 7  INFECTIOUS A:   RLL infiltrate on CXR ?UTI P:   Continue vanc zosyn Await cultures Trend WBC, fevers  ENDOCRINE A:   hyperglycemia P:   a1c 6.4 Follow CBGs SSI on TSH normal 1.3  NEUROLOGIC A:   Sedated for vent synchrony P:   RASS goal: -1 Continue versed, fentanyl for sedation  FAMILY  - Updates:  - Inter-disciplinary family meet or Palliative Care meeting due by:  2/20   Dolores Patty, DO PGY-2, [facility] 05/02/2017 8:15 AM

## 2017-05-02 NOTE — Progress Notes (Signed)
Pharmacy Antibiotic Note  Melonie FloridaLarry E Foland is a 67 y.o. male admitted on 05/01/2017 with sepsis.  Pharmacy has been consulted for Vancomycin and Zosyn dosing. WBC down to 13.3 and Tmax 101.3. LA 1.5. Renal function worsened today with SCr up to 4.97. CrCl ~ 15  mL/min  Plan: -Adjust Zosyn to 2.25 gm every 6 hours over 30 minutes  -Adjust Vancomycin to 1000 mg every 48 hours  -Monitor CBC, renal fx, cultures and clinical progress -VT at SS  Height: 5\' 10"  (177.8 cm) Weight: 184 lb 8.4 oz (83.7 kg) IBW/kg (Calculated) : 73  Temp (24hrs), Avg:98.6 F (37 C), Min:96.9 F (36.1 C), Max:101.3 F (38.5 C)  Recent Labs  Lab 05/01/17 1110 05/01/17 1123 05/01/17 1301 05/01/17 1954 05/02/17 0237 05/02/17 1049  WBC 15.6*  --   --  18.9* 13.6*  --   CREATININE 2.50*  --   --  3.35*  --  4.97*  LATICACIDVEN  --  7.85* 6.17* 5.9*  --  1.5    Estimated Creatinine Clearance: 15.1 mL/min (A) (by C-G formula based on SCr of 4.97 mg/dL (H)).    No Known Allergies  Antimicrobials this admission: Vanc 2/13 >>  Zosyn 2/13 >>   Dose adjustments this admission: None   Microbiology results: 2/13 BCx: gm negative coccobacilli/gm positive rods on gm stain  2/13 UCX:  >100,000 colonies unidentified organism  Thank you for allowing pharmacy to be a part of this patient's care.  Sharin MonsEmily Malasia Torain, PharmD, BCPS PGY2 Infectious Diseases Pharmacy Resident Pager: [phone]

## 2017-05-02 NOTE — Progress Notes (Signed)
RT attempted to place arterial line x2 and was unsuccessful. RN aware and contacted MD.

## 2017-05-03 ENCOUNTER — Inpatient Hospital Stay (HOSPITAL_COMMUNITY): Payer: Medicare Other

## 2017-05-03 DIAGNOSIS — I824Y3 Acute embolism and thrombosis of unspecified deep veins of proximal lower extremity, bilateral: Secondary | ICD-10-CM

## 2017-05-03 DIAGNOSIS — I509 Heart failure, unspecified: Secondary | ICD-10-CM

## 2017-05-03 DIAGNOSIS — I2609 Other pulmonary embolism with acute cor pulmonale: Secondary | ICD-10-CM

## 2017-05-03 DIAGNOSIS — A419 Sepsis, unspecified organism: Secondary | ICD-10-CM

## 2017-05-03 DIAGNOSIS — R570 Cardiogenic shock: Secondary | ICD-10-CM

## 2017-05-03 DIAGNOSIS — N3001 Acute cystitis with hematuria: Secondary | ICD-10-CM

## 2017-05-03 LAB — GLUCOSE, CAPILLARY
GLUCOSE-CAPILLARY: 126 mg/dL — AB (ref 65–99)
GLUCOSE-CAPILLARY: 147 mg/dL — AB (ref 65–99)
GLUCOSE-CAPILLARY: 148 mg/dL — AB (ref 65–99)
GLUCOSE-CAPILLARY: 192 mg/dL — AB (ref 65–99)
GLUCOSE-CAPILLARY: 94 mg/dL (ref 65–99)
Glucose-Capillary: 134 mg/dL — ABNORMAL HIGH (ref 65–99)
Glucose-Capillary: 191 mg/dL — ABNORMAL HIGH (ref 65–99)

## 2017-05-03 LAB — BASIC METABOLIC PANEL
Anion gap: 16 — ABNORMAL HIGH (ref 5–15)
Anion gap: 17 — ABNORMAL HIGH (ref 5–15)
Anion gap: 16 — ABNORMAL HIGH (ref 5–15)
BUN: 96 mg/dL — AB (ref 6–20)
BUN: 102 mg/dL — ABNORMAL HIGH (ref 6–20)
BUN: 100 mg/dL — AB (ref 6–20)
CALCIUM: 6.5 mg/dL — AB (ref 8.9–10.3)
CALCIUM: 6.2 mg/dL — AB (ref 8.9–10.3)
CHLORIDE: 114 mmol/L — AB (ref 101–111)
CHLORIDE: 113 mmol/L — AB (ref 101–111)
CHLORIDE: 114 mmol/L — AB (ref 101–111)
CO2: 18 mmol/L — AB (ref 22–32)
CO2: 19 mmol/L — ABNORMAL LOW (ref 22–32)
CO2: 20 mmol/L — AB (ref 22–32)
CREATININE: 6.33 mg/dL — AB (ref 0.61–1.24)
CREATININE: 6.66 mg/dL — AB (ref 0.61–1.24)
Calcium: 6 mg/dL — CL (ref 8.9–10.3)
Creatinine, Ser: 6.37 mg/dL — ABNORMAL HIGH (ref 0.61–1.24)
GFR calc Af Amer: 9 mL/min — ABNORMAL LOW (ref >60)
GFR calc non Af Amer: 8 mL/min — ABNORMAL LOW (ref >60)
GFR calc non Af Amer: 8 mL/min — ABNORMAL LOW (ref >60)
GFR calc non Af Amer: 8 mL/min — ABNORMAL LOW (ref >60)
GFR, EST AFRICAN AMERICAN: 10 mL/min — AB (ref >60)
GFR, EST AFRICAN AMERICAN: 9 mL/min — AB (ref >60)
GLUCOSE: 164 mg/dL — AB (ref 65–99)
GLUCOSE: 135 mg/dL — AB (ref 65–99)
Glucose, Bld: 173 mg/dL — ABNORMAL HIGH (ref 65–99)
POTASSIUM: 4.2 mmol/L (ref 3.5–5.1)
Potassium: 4.9 mmol/L (ref 3.5–5.1)
Potassium: 4.4 mmol/L (ref 3.5–5.1)
SODIUM: 149 mmol/L — AB (ref 135–145)
Sodium: 148 mmol/L — ABNORMAL HIGH (ref 135–145)
Sodium: 150 mmol/L — ABNORMAL HIGH (ref 135–145)

## 2017-05-03 LAB — BLOOD GAS, ARTERIAL
Acid-base deficit: 5 mmol/L — ABNORMAL HIGH (ref 0.0–2.0)
Bicarbonate: 19.8 mmol/L — ABNORMAL LOW (ref 20.0–28.0)
DRAWN BY: 252031
FIO2: 40
O2 Saturation: 97.2 %
PEEP: 5 cmH2O
PH ART: 7.336 — AB (ref 7.350–7.450)
Patient temperature: 98.6
RATE: 18 resp/min
VT: 580 mL
pCO2 arterial: 38 mmHg (ref 32.0–48.0)
pO2, Arterial: 114 mmHg — ABNORMAL HIGH (ref 83.0–108.0)

## 2017-05-03 LAB — COOXEMETRY PANEL
CARBOXYHEMOGLOBIN: 0.5 % (ref 0.5–1.5)
Carboxyhemoglobin: 0.5 % (ref 0.5–1.5)
METHEMOGLOBIN: 1.3 % (ref 0.0–1.5)
Methemoglobin: 1.4 % (ref 0.0–1.5)
O2 SAT: 97.4 %
O2 Saturation: 46.3 %
TOTAL HEMOGLOBIN: 9.2 g/dL — AB (ref 12.0–16.0)
Total hemoglobin: 9.2 g/dL — ABNORMAL LOW (ref 12.0–16.0)

## 2017-05-03 LAB — URINE CULTURE

## 2017-05-03 LAB — CBC
HCT: 29.5 % — ABNORMAL LOW (ref 39.0–52.0)
Hemoglobin: 9.4 g/dL — ABNORMAL LOW (ref 13.0–17.0)
MCH: 28.7 pg (ref 26.0–34.0)
MCHC: 31.9 g/dL (ref 30.0–36.0)
MCV: 90.2 fL (ref 78.0–100.0)
Platelets: 112 10*3/uL — ABNORMAL LOW (ref 150–400)
RBC: 3.27 MIL/uL — AB (ref 4.22–5.81)
RDW: 14.2 % (ref 11.5–15.5)
WBC: 20.8 10*3/uL — AB (ref 4.0–10.5)

## 2017-05-03 LAB — HEPATITIS PANEL, ACUTE
HCV Ab: 0.1 s/co ratio (ref 0.0–0.9)
HEP A IGM: NEGATIVE
Hep B C IgM: NEGATIVE
Hepatitis B Surface Ag: NEGATIVE

## 2017-05-03 LAB — TROPONIN I: Troponin I: 2.29 ng/mL (ref <0.03)

## 2017-05-03 LAB — HEPATIC FUNCTION PANEL
ALBUMIN: 1.7 g/dL — AB (ref 3.5–5.0)
ALK PHOS: 72 U/L (ref 38–126)
ALT: 716 U/L — ABNORMAL HIGH (ref 17–63)
AST: 896 U/L — ABNORMAL HIGH (ref 15–41)
BILIRUBIN DIRECT: 0.2 mg/dL (ref 0.1–0.5)
BILIRUBIN INDIRECT: 0.5 mg/dL (ref 0.3–0.9)
BILIRUBIN TOTAL: 0.7 mg/dL (ref 0.3–1.2)
Total Protein: 5.5 g/dL — ABNORMAL LOW (ref 6.5–8.1)

## 2017-05-03 LAB — HEPARIN LEVEL (UNFRACTIONATED): Heparin Unfractionated: 0.44 IU/mL (ref 0.30–0.70)

## 2017-05-03 LAB — PROCALCITONIN: Procalcitonin: 22.9 ng/mL

## 2017-05-03 LAB — CK: CK TOTAL: 4620 U/L — AB (ref 49–397)

## 2017-05-03 LAB — MAGNESIUM
MAGNESIUM: 2.3 mg/dL (ref 1.7–2.4)
MAGNESIUM: 2.2 mg/dL (ref 1.7–2.4)

## 2017-05-03 LAB — LACTIC ACID, PLASMA: Lactic Acid, Venous: 1.2 mmol/L (ref 0.5–1.9)

## 2017-05-03 LAB — PHOSPHORUS: Phosphorus: 6.8 mg/dL — ABNORMAL HIGH (ref 2.5–4.6)

## 2017-05-03 MED ORDER — DOBUTAMINE IN D5W 4-5 MG/ML-% IV SOLN
2.5000 ug/kg/min | INTRAVENOUS | Status: DC
  Administered 2017-05-03: 2 ug/kg/min via INTRAVENOUS
  Administered 2017-05-04: 15 ug/kg/min via INTRAVENOUS
  Administered 2017-05-05: 7.5 ug/kg/min via INTRAVENOUS
  Administered 2017-05-06: 12.5 ug/kg/min via INTRAVENOUS
  Administered 2017-05-07: 9 ug/kg/min via INTRAVENOUS
  Filled 2017-05-03 (×5): qty 250

## 2017-05-03 MED ORDER — HEPARIN SODIUM (PORCINE) 1000 UNIT/ML IJ SOLN
3.0000 mL | Freq: Once | INTRAMUSCULAR | Status: AC
  Administered 2017-05-03: 3000 [IU] via INTRAVENOUS
  Filled 2017-05-03 (×2): qty 3

## 2017-05-03 MED ORDER — ALBUMIN HUMAN 5 % IV SOLN
12.5000 g | Freq: Once | INTRAVENOUS | Status: AC
  Administered 2017-05-03: 12.5 g via INTRAVENOUS
  Filled 2017-05-03: qty 250

## 2017-05-03 MED ORDER — SODIUM CHLORIDE 0.9 % IV SOLN
1.0000 g | Freq: Once | INTRAVENOUS | Status: AC
  Administered 2017-05-03: 1 g via INTRAVENOUS
  Filled 2017-05-03: qty 10

## 2017-05-03 MED ORDER — DEXMEDETOMIDINE HCL 200 MCG/2ML IV SOLN
0.0000 ug/kg/h | INTRAVENOUS | Status: DC
Start: 2017-05-03 — End: 2017-05-03
  Administered 2017-05-03: 0.9 ug/kg/h via INTRAVENOUS
  Filled 2017-05-03: qty 2

## 2017-05-03 MED ORDER — PRO-STAT SUGAR FREE PO LIQD
30.0000 mL | Freq: Two times a day (BID) | ORAL | Status: DC
  Filled 2017-05-03: qty 30

## 2017-05-03 MED ORDER — FREE WATER
100.0000 mL | Freq: Four times a day (QID) | Status: DC
  Administered 2017-05-03 – 2017-05-14 (×42): 100 mL

## 2017-05-03 MED ORDER — DEXMEDETOMIDINE HCL IN NACL 400 MCG/100ML IV SOLN
0.0000 ug/kg/h | INTRAVENOUS | Status: DC
  Administered 2017-05-03: 0.4 ug/kg/h via INTRAVENOUS
  Administered 2017-05-04: 0.7 ug/kg/h via INTRAVENOUS
  Administered 2017-05-04: 1 ug/kg/h via INTRAVENOUS
  Administered 2017-05-04: 0.4 ug/kg/h via INTRAVENOUS
  Administered 2017-05-04: 0.6 ug/kg/h via INTRAVENOUS
  Administered 2017-05-05 – 2017-05-06 (×7): 1 ug/kg/h via INTRAVENOUS
  Administered 2017-05-06: 2 ug/kg/h via INTRAVENOUS
  Administered 2017-05-06: 1 ug/kg/h via INTRAVENOUS
  Administered 2017-05-06: 1.8 ug/kg/h via INTRAVENOUS
  Administered 2017-05-06: 1 ug/kg/h via INTRAVENOUS
  Administered 2017-05-07: 2 ug/kg/h via INTRAVENOUS
  Administered 2017-05-07: 1.2 ug/kg/h via INTRAVENOUS
  Administered 2017-05-07 – 2017-05-08 (×19): 2 ug/kg/h via INTRAVENOUS
  Administered 2017-05-08: 1.9 ug/kg/h via INTRAVENOUS
  Administered 2017-05-09 (×2): 2 ug/kg/h via INTRAVENOUS
  Administered 2017-05-09: 1.8 ug/kg/h via INTRAVENOUS
  Administered 2017-05-09 (×4): 2 ug/kg/h via INTRAVENOUS
  Administered 2017-05-09: 1.7 ug/kg/h via INTRAVENOUS
  Administered 2017-05-09: 2 ug/kg/h via INTRAVENOUS
  Administered 2017-05-10: 1 ug/kg/h via INTRAVENOUS
  Administered 2017-05-10: 1.5 ug/kg/h via INTRAVENOUS
  Administered 2017-05-10 (×2): 1.8 ug/kg/h via INTRAVENOUS
  Administered 2017-05-10: 1.6 ug/kg/h via INTRAVENOUS
  Administered 2017-05-10: 1.8 ug/kg/h via INTRAVENOUS
  Administered 2017-05-10: 1.7 ug/kg/h via INTRAVENOUS
  Administered 2017-05-11: 1.2 ug/kg/h via INTRAVENOUS
  Administered 2017-05-11: 1.8 ug/kg/h via INTRAVENOUS
  Administered 2017-05-11: 1 ug/kg/h via INTRAVENOUS
  Administered 2017-05-11: 1.2 ug/kg/h via INTRAVENOUS
  Administered 2017-05-11: 1 ug/kg/h via INTRAVENOUS
  Administered 2017-05-11: 1.5 ug/kg/h via INTRAVENOUS
  Administered 2017-05-11: 2 ug/kg/h via INTRAVENOUS
  Administered 2017-05-12: 1 ug/kg/h via INTRAVENOUS
  Administered 2017-05-12: 1.2 ug/kg/h via INTRAVENOUS
  Administered 2017-05-12 (×2): 1 ug/kg/h via INTRAVENOUS
  Administered 2017-05-12: 1.2 ug/kg/h via INTRAVENOUS
  Administered 2017-05-12: 1.5 ug/kg/h via INTRAVENOUS
  Administered 2017-05-12 – 2017-05-13 (×3): 1.2 ug/kg/h via INTRAVENOUS
  Administered 2017-05-13: 1.3 ug/kg/h via INTRAVENOUS
  Administered 2017-05-13: 2 ug/kg/h via INTRAVENOUS
  Administered 2017-05-13 – 2017-05-14 (×2): 1.3 ug/kg/h via INTRAVENOUS
  Administered 2017-05-14: 1.4 ug/kg/h via INTRAVENOUS
  Filled 2017-05-03 (×2): qty 100
  Filled 2017-05-03: qty 200
  Filled 2017-05-03 (×10): qty 100
  Filled 2017-05-03: qty 200
  Filled 2017-05-03 (×45): qty 100
  Filled 2017-05-03: qty 200
  Filled 2017-05-03 (×17): qty 100

## 2017-05-03 MED ORDER — STERILE WATER FOR INJECTION IV SOLN
INTRAVENOUS | Status: DC
  Administered 2017-05-03: 03:00:00 via INTRAVENOUS
  Filled 2017-05-03 (×6): qty 850

## 2017-05-03 MED ORDER — VITAL HIGH PROTEIN PO LIQD: 1000.0000 mL | ORAL | Status: DC

## 2017-05-03 MED ORDER — SILVER SULFADIAZINE 1 % EX CREA
TOPICAL_CREAM | Freq: Two times a day (BID) | CUTANEOUS | Status: DC
  Administered 2017-05-03: 2 via TOPICAL
  Administered 2017-05-04 – 2017-05-06 (×5): via TOPICAL
  Filled 2017-05-03 (×2): qty 85

## 2017-05-03 MED ORDER — SODIUM CHLORIDE 0.9 % IV SOLN
INTRAVENOUS | Status: DC | PRN
  Administered 2017-05-12 – 2017-05-13 (×2): via INTRA_ARTERIAL

## 2017-05-03 NOTE — Progress Notes (Signed)
PULMONARY / CRITICAL CARE MEDICINE   Name: Joseph Downs MRN: [id number] DOB: [birth year]    ADMISSION DATE:  05/01/2017 CONSULTATION DATE:  05/01/17  REFERRING MD:  Dr. Deretha Emory  CHIEF COMPLAINT:  Found down  HISTORY OF PRESENT ILLNESS:        This is a 67 year old man who was found down.  It should be noted that it is been in the 40s for the past 24 hours.  He was tachypneic and hypotensive as well as hypothermic on arrival with a lactic acid in excess of 7.  He has received 3.5 L of crystalloid resuscitation along with vancomycin and Zosyn and he has been warmed with a bear hugger.  He is now alert and able to communicate with me.  He tells me that he had a syncopal episode last night but is a little hazy on the exact details.  He is not aware that he had any palpitations preceding that episode and he denies a prior history of seizure disorder.  He denies any new pains suggestive of an unprotected fall.  He denies use of street drugs or alcohol. Critical care service is asked to admit the patient because he still has a lactate acid in excess of 6 after being warmed and fluid resuscitated. Patient was initiated on bipap but would not tolerate mask. He was intubated shortly after admission.   SOCIAL HISTORY: He  reports that  has never smoked. he has never used smokeless tobacco. He reports that he does not drink alcohol or use drugs.  REVIEW OF SYSTEMS:   Unable to obtain  SUBJECTIVE:  Intubated, sedated  VITAL SIGNS: BP 94/61   Pulse 93   Temp (!) 97.5 F (36.4 C) (Oral)   Resp 17   Ht 5\' 10"  (1.778 m)   Wt 196 lb 13.9 oz (89.3 kg)   SpO2 100%   BMI 28.25 kg/m   HEMODYNAMICS:    VENTILATOR SETTINGS: Vent Mode: PRVC FiO2 (%):  [40 %] 40 % Set Rate:  [18 bmp] 18 bmp Vt Set:  [580 mL] 580 mL PEEP:  [5 cmH20] 5 cmH20 Pressure Support:  [5 cmH20] 5 cmH20 Plateau Pressure:  [10 cmH20-12 cmH20] 10 cmH20  INTAKE / OUTPUT: I/O last 3 completed shifts: In: 8567.7  [I.V.:6601.8; Other:100; NG/GT:665.8; IV Piggyback:1200] Out: 277 [Urine:177; Emesis/NG output:100]  PHYSICAL EXAMINATION: General:  Ill appearing man laying in bed, sedated, intubated Neuro:  sedated HEENT:  ETT in place, MMM Cardiovascular:  RRR, no MRG Lungs:  Coarse breath sounds bilaterally Abdomen:  Soft, non-distended, +BS Musculoskeletal:  +3 pitting edema LE bilaterally, left > right Skin:  In tact  LABS:  BMET Recent Labs  Lab 05/02/17 1049 05/02/17 1835 05/03/17 0328  NA 144 148* 148*  K 6.5* 5.8* 4.9  CL 115* 116* 114*  CO2 14* 15* 18*  BUN 82* 89* 96*  CREATININE 4.97* 5.76* 6.33*  GLUCOSE 121* 132* 164*    Electrolytes Recent Labs  Lab 05/02/17 0237 05/02/17 1049 05/02/17 1835 05/02/17 1846 05/03/17 0328  CALCIUM  --  7.4* 7.1*  --  6.5*  MG 1.9  --   --  2.5* 2.3  PHOS 6.2*  --   --   --   --     CBC Recent Labs  Lab 05/02/17 0237 05/02/17 1835 05/03/17 0328  WBC 13.6* 20.2* 20.8*  HGB 9.6* 10.4* 9.4*  HCT 30.3* 32.8* 29.5*  PLT 88* 137* 112*    Coag's No results for input(s): APTT, INR  in the last 168 hours.  Sepsis Markers Recent Labs  Lab 05/01/17 1954 05/02/17 0237 05/02/17 1049 05/02/17 1835 05/03/17 0328  LATICACIDVEN 5.9*  --  1.5 1.9 1.2  PROCALCITON 14.36 19.69  --   --  22.90    ABG Recent Labs  Lab 05/01/17 2119 05/02/17 0419 05/03/17 0326  PHART 7.436 7.260* 7.336*  PCO2ART 21.6* 47.2 38.0  PO2ART 55.0* 349.0* 114*    Liver Enzymes Recent Labs  Lab 05/02/17 1049 05/02/17 1835 05/03/17 0328  AST 2,228* 1,471* 896*  ALT 957* 868* 716*  ALKPHOS 63 70 72  BILITOT 0.8 0.9 0.7  ALBUMIN 1.9* 1.9* 1.7*    Cardiac Enzymes Recent Labs  Lab 05/02/17 1049 05/02/17 1835 05/03/17 0328  TROPONINI 5.65* 3.84* 2.29*    Glucose Recent Labs  Lab 05/02/17 1146 05/02/17 1522 05/02/17 2041 05/03/17 0008 05/03/17 0405 05/03/17 0750  GLUCAP 102* 82 123* 126* 94 147*    Imaging Vas [place] Lower Extremity  Venous (dvt)  Result Date: 05/02/2017  Lower Venous Study Indication: Swelling, and Erythema. Risk Factors: DVT history. Examination Guidelines: A complete evaluation includes B-mode imaging, spectral doppler, color doppler, and power doppler as needed of all accessible portions of each vessel. Bilateral testing is considered an integral part of a complete examination. Limited examinations for reoccurring indications may be performed as noted.  Right Venous Findings: +---------+---------------+---------+-----------+----------+-------+          CompressibilityPhasicitySpontaneityPropertiesSummary +---------+---------------+---------+-----------+----------+-------+ CFV      Full                                                 +---------+---------------+---------+-----------+----------+-------+ FV Prox  Full                                                 +---------+---------------+---------+-----------+----------+-------+ FV Mid   Full                                                 +---------+---------------+---------+-----------+----------+-------+ FV DistalFull                                                 +---------+---------------+---------+-----------+----------+-------+ PFV      Full                                                 +---------+---------------+---------+-----------+----------+-------+ POP      Full                                                 +---------+---------------+---------+-----------+----------+-------+ PTV      None  dilated   Acute   +---------+---------------+---------+-----------+----------+-------+ PERO     None                               dilated   Acute   +---------+---------------+---------+-----------+----------+-------+  Left Venous Findings: +---------+---------------+---------+-----------+----------+-------+          CompressibilityPhasicitySpontaneityPropertiesSummary  +---------+---------------+---------+-----------+----------+-------+ CFV      Full           Yes      Yes                          +---------+---------------+---------+-----------+----------+-------+ FV Prox  None                               dilated   Acute   +---------+---------------+---------+-----------+----------+-------+ FV Mid   None                               dilated   Acute   +---------+---------------+---------+-----------+----------+-------+ FV DistalNone                               dilated   Acute   +---------+---------------+---------+-----------+----------+-------+ PFV      Full                                                 +---------+---------------+---------+-----------+----------+-------+ POP      None           No       No         dilated   Acute   +---------+---------------+---------+-----------+----------+-------+ PTV      None                               dilated   Acute   +---------+---------------+---------+-----------+----------+-------+ PERO     None                               dilated   Acute   +---------+---------------+---------+-----------+----------+-------+    Final Interpretation: Right: There is evidence of acute DVT in the Posterior Tibial veins, and Peroneal veins. Left: There is evidence of acute DVT in the Femoral vein, Popliteal vein, Posterior Tibial veins, and Peroneal veins.  *See table(s) above for measurements and observations. Electronically signed by Lemar Livings on 05/02/2017 at 3:18:26 PM.  Echo Study Conclusions  - Left ventricle: The cavity size was normal. There was severe   concentric hypertrophy. Systolic function was vigorous. The   estimated ejection fraction was in the range of 65% to 70%.   Doppler parameters are consistent with abnormal left ventricular   relaxation (grade 1 diastolic dysfunction). The E/e&' ratio is <8,   suggesting normal LV filling pressure. - Aortic valve:  Sclerosis without stenosis. There was no   regurgitation. - Aorta: Dilated aortic root. Aortic root dimension: 40 mm (ED). - Mitral valve: Mildly thickened leaflets . There was trivial   regurgitation. - Left atrium: The atrium was mildly dilated. - Right ventricle: Moderate to severely dilated and moderately  hypokinetic. Apical function is preserved (McConnell&'s sign).   Lateral annulus peak S velocity: 7.94 cm/s. - Right atrium: Moderately dilated. - Tricuspid valve: There was mild regurgitation. - Pulmonary arteries: PA peak pressure: 49 mm Hg (S). - Inferior vena cava: The vessel was dilated. The respirophasic   diameter changes were blunted (< 50%), consistent with elevated   central venous pressure. On vent.  Impressions:  - LVEF 65-70%, severe LVH, normal wall motion, grade 1 DD, normal   LV filling pressure, dilated aortic root to 4.0 cm, aortic   sclerosis with trivial AI, trivial MR, mild LAE, moderate to   severe RV dilitation with moderate hypokinesis with normal apical   function (McConnell&'s sign)- suggestive of RV strain (consider   pulmonary embolus), mild TR, RVSP 49 mmHg, dilated IVC (on vent).  STUDIES:  CXR 2/13 - no acute cardiopulmonary abnormality ECHO 2/14 - EF 65-70%, G1DD, RV strain  CULTURES: MRSA PCR negative Ucx + staph lugdenensis Blood cx - G+ rods, G- coccobacilli  ANTIBIOTICS: Vanc 2/13 > Zosyn 2/13>   SIGNIFICANT EVENTS: 2/13 admitted, intubated  LINES/TUBES: ETT 2/13 PIV Foley 2/13 OGT 2/13  DISCUSSION:      This is a 67 year old who was found hypothermic hypotensive and tachypneic and unresponsive in a cold environment.  After fluid resuscitating the patient and warming him he has a persistent lactic acidosis.  Both urine and chest exam suggest a source of infection.  In addition there is concern as to why he became unresponsive and suffered from exposure to the cold.  He gives a history of what sounds like new onset congestive  heart failure, there is concern for arrhythmic event. Found to have bilateral DVT's, echo demonstrates right heart strain- likely PE. He has UTI. Blood cx +, further speciation and sensitivities pending. He has acute oliguric renal failure and elevated CK.   ASSESSMENT / PLAN:  PULMONARY A: Acute respiratory failure ?likely PE P:   Continue vent support Wean support as able duonebs q6H Heparin drip  CARDIOVASCULAR A:  tropinemia- resolving Suspected CHF hypotension P:  Cards following, appreciate recs- ischemic work up once patient is stable, recommend dobutamine for cardiogenic shock and placing central line to assess CVP On heparin drip for elevated trops, DVTs, likely PE Titrate levophed for MAP >65  RENAL A:   Acute oliguric renal failure hyperkalemia Lactic acidosis- resolved Rhabdomyolysis P:   Nephrology following, appreciate recommendations Continue bicarb 75cc/hr Follow CK Trend BMP  GASTROINTESTINAL A:   NPO Elevated LFTs P:   Tube feeds PPI IV Bowel regimen Follow LFTs  HEMATOLOGIC A:   Bilateral DVTs Leukocytosis anemia P:  Trend CBC Transfusion threshold < 7 Continue heparin  INFECTIOUS A:   RLL infiltrate on CXR UTI-  >100,000 colonies staph lugdenenis, sensitivities pending P:   Continue vanc zosyn Await cultures, BCID pos 1/2 bottles gram - coccobacilli and gram + rods Ucx +, await sensitivities Trend WBC, fevers  GU A:  urinary retention P: PSA elevated Foley placed Monitor UOP  ENDOCRINE A:   hyperglycemia P:   a1c 6.4, TSH normal 1.3 Follow CBGs SSI on  NEUROLOGIC A:   Sedated for vent synchrony P:   RASS goal: -1 Continue fentanyl drip for sedation Versed as needed Add precedex  FAMILY  - Updates: no family at bedside - Inter-disciplinary family meet or Palliative Care meeting due by:  2/20   Dolores Patty, DO PGY-2, [facility] 05/03/2017 8:43 AM

## 2017-05-03 NOTE — Procedures (Signed)
Arterial Catheter Insertion Procedure Note Melonie FloridaLarry E Sabey [id number] [birth year]  Procedure: Insertion of Arterial Catheter  Indications: Blood pressure monitoring and Frequent blood sampling  Procedure Details Consent: Risks of procedure as well as the alternatives and risks of each were explained to the (patient/caregiver).  Consent for procedure obtained. and Unable to obtain consent because of altered level of consciousness. Time Out: Verified patient identification, verified procedure, site/side was marked, verified correct patient position, special equipment/implants available, medications/allergies/relevent history reviewed, required imaging and test results available.  Performed  Maximum sterile technique was used including antiseptics, cap, gloves, gown, hand hygiene, mask and sheet. Skin prep: Chlorhexidine; local anesthetic administered 22 gauge catheter was inserted into left radial artery using the Seldinger technique.  Evaluation Blood flow good; BP tracing good. Complications: No apparent complications.   Leafy HalfSnider, Ciaira Natividad Dale 05/03/2017

## 2017-05-03 NOTE — Progress Notes (Signed)
CRITICAL VALUE ALERT  Critical Value:  Ca 2+ 6.2  Date & Time Notied:  2/15  Provider Notified: CCM resident  Orders Received/Actions taken: awaiting orders

## 2017-05-03 NOTE — Progress Notes (Signed)
On admission, pt had bilateral lower leg serous blisters, which have opened today. Skin sloughed off both extremities, left significantly greater than right, revealing wound bed that has an appearance consistent with full thickness frostbite. Notified ELink MD Panchal, [facility] silvadene ointment. Upon neurovascular check, left foot has absent capillary refill and pedal pulses but posterior tibial pulses were dopplered. Right foot has absent capillary refill but has dopplerable pedal and posterior tibial pulses. NP and MD at bedside were able to visually assess the burn prior to dressing.  Dressed burns using gauze impregnated with silvadene, wrapped in kerlex gauze, then applied compression wrap. Elevated both limbs.   Will continue to monitor.

## 2017-05-03 NOTE — Progress Notes (Signed)
Nutrition Consult / Follow-up  DOCUMENTATION CODES:   Non-severe (moderate) malnutrition in context of social or environmental circumstances  INTERVENTION:    Continue Vital AF 1.2 at 75 ml/h to provide 2160 kcal, 135 gm protein, 1460 ml free water daily  NUTRITION DIAGNOSIS:   Moderate Malnutrition related to social / environmental circumstances as evidenced by mild fat depletion, mild muscle depletion.  Ongoing  GOAL:   Patient will meet greater than or equal to 90% of their needs  Met with TF  MONITOR:   Vent status, TF tolerance, Labs, I & O's  REASON FOR ASSESSMENT:   Consult Enteral/tube feeding initiation and management  ASSESSMENT:   67 yo male with no PMH who was admitted on 2/13 after being found unresponsive outside by police with hypothermia, hypotension, elevated lactic acid. Required intubation on admission. Found to have extensive bilateral DVTs.  Discussed patient in ICU rounds and with RN today. Received MD Consult for TF initiation and management. Tolerating TF well at goal rate. Patient is currently intubated on ventilator support MV: 20 L/min Temp (24hrs), Avg:97.9 F (36.6 C), Min:97.3 F (36.3 C), Max:98.8 F (37.1 C)  Labs and medications reviewed. Sodium 149 (H) Potassium & magnesium WNL  Diet Order:  Diet NPO time specified  EDUCATION NEEDS:   No education needs have been identified at this time  Skin:  Skin Assessment: Reviewed RN Assessment  Last BM:  unknown  Height:   Ht Readings from Last 1 Encounters:  05/01/17 _0  (1.778 m)    Weight:   Wt Readings from Last 1 Encounters:  05/03/17 196 lb 13.9 oz (89.3 kg)    Ideal Body Weight:  75.5 kg  BMI:  Body mass index is 28.25 kg/m.  Estimated Nutritional Needs:   Kcal:  2100  Protein:  125-150 gm  Fluid:  2.1 L   Molli Barrows, RD, LDN, [place] Pager [phone] After Hours Pager [phone]

## 2017-05-03 NOTE — Progress Notes (Signed)
ANTICOAGULATION CONSULT NOTE - Follow- Up Consult  Pharmacy Consult for Heparin  Indication: chest pain/ACS / new BL DVTs/possible PE  No Known Allergies  Patient Measurements: Height: 5\' 10"  (177.8 cm) Weight: 196 lb 13.9 oz (89.3 kg) IBW/kg (Calculated) : 73  Vital Signs: Temp: 97.5 F (36.4 C) (02/15 0750) Temp Source: Oral (02/15 0750) BP: 99/50 (02/15 1100) Pulse Rate: 94 (02/15 1100)  Labs: Recent Labs    05/01/17 1954 05/02/17 0237  05/02/17 0900 05/02/17 1049 05/02/17 1835 05/02/17 1846 05/03/17 0328 05/03/17 0930 05/03/17 1000  HGB 11.8* 9.6*  --   --   --  10.4*  --  9.4*  --   --   HCT 36.9* 30.3*  --   --   --  32.8*  --  29.5*  --   --   PLT 96* 88*  --   --   --  137*  --  112*  --   --   HEPARINUNFRC  --   --    < > 0.29*  --   --  0.57  --  0.44  --   CREATININE 3.35*  --   --   --  4.97* 5.76*  --  6.33*  --  6.66*  CKTOTAL 2,933*  --   --   --   --   --   --  4,620*  --   --   TROPONINI 2.83* 5.30*  --   --  5.65* 3.84*  --  2.29*  --   --    < > = values in this interval not displayed.    Estimated Creatinine Clearance: 12.3 mL/min (A) (by C-G formula based on SCr of 6.66 mg/dL (H)).   Assessment: [name] in the ICU after being "found down". His troponin is elevated and now trending down. Patient has now been found to have bilateral lower extremity with concern for possible PE. Pharmacy is dosing heparin.  Heparin drip 1200 uts/hr HL 0.44 at goal X 2  CBC and platelets are down but no signs of bleeding.   Goal of Therapy:  Heparin level 0.3-0.7 units/ml Monitor platelets by anticoagulation protocol: Yes   Plan:  Continue Heparin drip 1200 units/hr Daily CBC/HL Monitor for bleeding    Sharin MonsEmily Jaqulyn Chancellor, PharmD, BCPS PGY2 Infectious Diseases Pharmacy Resident Pager: [phone]  05/03/2017 11:26 AM

## 2017-05-03 NOTE — Progress Notes (Signed)
S: Pt felt to have had an acute PE with decompensated RV failure and mulit-organ failure.  Cardiology starting dobutamine. O:BP (!) 99/50   Pulse 96   Temp (!) 97.3 F (36.3 C) (Axillary)   Resp 15   Ht _0  (1.778 m)   Wt 89.3 kg (196 lb 13.9 oz)   SpO2 91%   BMI 28.25 kg/m   Intake/Output Summary (Last 24 hours) at 05/03/2017 1408 Last data filed at 05/03/2017 0600 Gross per 24 hour  Intake 5122.6 ml  Output 125 ml  Net 4997.6 ml   Intake/Output: I/O last 3 completed shifts: In: 8567.7 [I.V.:6601.8; Other:100; NG/GT:665.8; IV Piggyback:1200] Out: 277 [Urine:177; Emesis/NG output:100]  Intake/Output this shift:  No intake/output data recorded. Weight change: 7.652 kg (16 lb 13.9 oz) Gen: intubated CVS: no rub Resp: cta Abd: benign Ext: 2+ edema  Recent Labs  Lab 05/01/17 1110 05/01/17 1954 05/02/17 0237 05/02/17 1049 05/02/17 1835 05/03/17 0328 05/03/17 1000  NA 145  --   --  144 148* 148* 149*  K 4.1  --   --  6.5* 5.8* 4.9 4.4  CL 110  --   --  115* 116* 114* 113*  CO2 14*  --   --  14* 15* 18* 19*  GLUCOSE 218*  --   --  121* 132* 164* 173*  BUN 58*  --   --  82* 89* 96* 102*  CREATININE 2.50* 3.35*  --  4.97* 5.76* 6.33* 6.66*  ALBUMIN 2.3*  --   --  1.9* 1.9* 1.7*  --   CALCIUM 8.7*  --   --  7.4* 7.1* 6.5* 6.2*  PHOS  --   --  6.2*  --   --   --   --   AST 81*  --   --  2,228* 1,471* 896*  --   ALT 83*  --   --  957* 868* 716*  --    Liver Function Tests: Recent Labs  Lab 05/02/17 1049 05/02/17 1835 05/03/17 0328  AST 2,228* 1,471* 896*  ALT 957* 868* 716*  ALKPHOS 63 70 72  BILITOT 0.8 0.9 0.7  PROT 6.0* 5.8* 5.5*  ALBUMIN 1.9* 1.9* 1.7*   No results for input(s): LIPASE, AMYLASE in the last 168 hours. No results for input(s): AMMONIA in the last 168 hours. CBC: Recent Labs  Lab 05/01/17 1110 05/01/17 1954 05/02/17 0237 05/02/17 1835 05/03/17 0328  WBC 15.6* 18.9* 13.6* 20.2* 20.8*  NEUTROABS 13.0*  --   --   --   --   HGB 13.1  11.8* 9.6* 10.4* 9.4*  HCT 41.3 36.9* 30.3* 32.8* 29.5*  MCV 92.0 89.8 89.6 91.4 90.2  PLT 181 96* 88* 137* 112*   Cardiac Enzymes: Recent Labs  Lab 05/01/17 1954 05/02/17 0237 05/02/17 1049 05/02/17 1835 05/03/17 0328  CKTOTAL 2,933*  --   --   --  4,620*  TROPONINI 2.83* 5.30* 5.65* 3.84* 2.29*   CBG: Recent Labs  Lab 05/02/17 2041 05/03/17 0008 05/03/17 0405 05/03/17 0750 05/03/17 1308  GLUCAP 123* 126* 94 147* 134*    Iron Studies: No results for input(s): IRON, TIBC, TRANSFERRIN, FERRITIN in the last 72 hours. Studies/Results: Dg Chest Port 1 View  Result Date: 05/01/2017 CLINICAL DATA:  Endotracheal tube placement.  OG tube placement. EXAM: PORTABLE CHEST 1 VIEW COMPARISON:  Radiograph earlier this day at 1131 hour FINDINGS: Endotracheal tube tip 3.7 cm from the carina. Enteric tube in place, tip below the diaphragm in the  stomach, side-port just at the gastroesophageal junction. Low lung volumes persist. Peripheral rounded density in the right upper lung zone subjacent to the lateral third rib. Developing patchy opacity in the right lung base. Calcified granulomas in the left lung. Unchanged heart size and mediastinal contours. No large pleural effusion. No pneumothorax. No acute osseous abnormalities are seen. IMPRESSION: 1. Endotracheal tube tip 3.7 cm from the carina. 2. Tip of the enteric tube below the diaphragm in the stomach, side-port just at the esophageal junction, consider advancement of 2-3 cm for optimal placement. 3. Persistent low lung volumes. Peripheral rounded density in the right upper lung zone may be developing pneumonia, however recommend radiographic follow-up to resolution to exclude underlying mass. Increasing patchy opacity at the right lung base, atelectasis or aspiration. Electronically Signed   By: Jeb Levering M.D.   On: 05/01/2017 23:08   Vas [place] Lower Extremity Venous (dvt)  Result Date: 05/02/2017  Lower Venous Study Indication: Swelling,  and Erythema. Risk Factors: DVT history. Examination Guidelines: A complete evaluation includes B-mode imaging, spectral doppler, color doppler, and power doppler as needed of all accessible portions of each vessel. Bilateral testing is considered an integral part of a complete examination. Limited examinations for reoccurring indications may be performed as noted.  Right Venous Findings: +---------+---------------+---------+-----------+----------+-------+          CompressibilityPhasicitySpontaneityPropertiesSummary +---------+---------------+---------+-----------+----------+-------+ CFV      Full                                                 +---------+---------------+---------+-----------+----------+-------+ FV Prox  Full                                                 +---------+---------------+---------+-----------+----------+-------+ FV Mid   Full                                                 +---------+---------------+---------+-----------+----------+-------+ FV DistalFull                                                 +---------+---------------+---------+-----------+----------+-------+ PFV      Full                                                 +---------+---------------+---------+-----------+----------+-------+ POP      Full                                                 +---------+---------------+---------+-----------+----------+-------+ PTV      None                               dilated   Acute   +---------+---------------+---------+-----------+----------+-------+  PERO     None                               dilated   Acute   +---------+---------------+---------+-----------+----------+-------+  Left Venous Findings: +---------+---------------+---------+-----------+----------+-------+          CompressibilityPhasicitySpontaneityPropertiesSummary +---------+---------------+---------+-----------+----------+-------+ CFV      Full            Yes      Yes                          +---------+---------------+---------+-----------+----------+-------+ FV Prox  None                               dilated   Acute   +---------+---------------+---------+-----------+----------+-------+ FV Mid   None                               dilated   Acute   +---------+---------------+---------+-----------+----------+-------+ FV DistalNone                               dilated   Acute   +---------+---------------+---------+-----------+----------+-------+ PFV      Full                                                 +---------+---------------+---------+-----------+----------+-------+ POP      None           No       No         dilated   Acute   +---------+---------------+---------+-----------+----------+-------+ PTV      None                               dilated   Acute   +---------+---------------+---------+-----------+----------+-------+ PERO     None                               dilated   Acute   +---------+---------------+---------+-----------+----------+-------+    Final Interpretation: Right: There is evidence of acute DVT in the Posterior Tibial veins, and Peroneal veins. Left: There is evidence of acute DVT in the Femoral vein, Popliteal vein, Posterior Tibial veins, and Peroneal veins.  *See table(s) above for measurements and observations. Electronically signed by Servando Snare on 05/02/2017 at 3:18:26 PM.  Final   . aspirin  81 mg Per Tube Daily  . chlorhexidine gluconate (MEDLINE KIT)  15 mL Mouth Rinse BID  . feeding supplement (PRO-STAT SUGAR FREE 64)  30 mL Per Tube BID  . feeding supplement (VITAL HIGH PROTEIN)  1,000 mL Per Tube Q24H  . insulin aspart  0-9 Units Subcutaneous TID WC  . ipratropium-albuterol  3 mL Nebulization Q6H  . mouth rinse  15 mL Mouth Rinse QID  . pantoprazole (PROTONIX) IV  40 mg Intravenous QHS  . thiamine injection  100 mg Intravenous Daily    BMET     Component Value Date/Time   NA 149 (H) 05/03/2017 1000   K 4.4 05/03/2017 1000   CL 113 (H) 05/03/2017  1000   CO2 19 (L) 05/03/2017 1000   GLUCOSE 173 (H) 05/03/2017 1000   BUN 102 (H) 05/03/2017 1000   CREATININE 6.66 (H) 05/03/2017 1000   CALCIUM 6.2 (LL) 05/03/2017 1000   GFRNONAA 8 (L) 05/03/2017 1000   GFRAA 9 (L) 05/03/2017 1000   CBC    Component Value Date/Time   WBC 20.8 (H) 05/03/2017 0328   RBC 3.27 (L) 05/03/2017 0328   HGB 9.4 (L) 05/03/2017 0328   HCT 29.5 (L) 05/03/2017 0328   PLT 112 (L) 05/03/2017 0328   MCV 90.2 05/03/2017 0328   MCH 28.7 05/03/2017 0328   MCHC 31.9 05/03/2017 0328   RDW 14.2 05/03/2017 0328   LYMPHSABS 1.7 05/01/2017 1110   MONOABS 0.9 05/01/2017 1110   EOSABS 0.0 05/01/2017 1110   BASOSABS 0.0 05/01/2017 1110     Assessment/Plan:  1. AKI- in setting of shock (hemodynamically significant PE with RV failure), hypovolemia, and mild rhabdomyolyisis (he also has history of urinary retention and a previous admission with AKI treated with Foley catheter).  Remains oliguric and discussed with PCCM.  Cardiology to add dobutamine and will follow renal function, however if his renal function continues to deteriorate, will likely require initiation of CRRT tomorrow. 2. Obstructive uropathy- possibly related to prostate cancer given presence of staph lugdunensis and markedly elevated PSA.  Foley in place  3. Bilateral DVT and presumed PE with RV failure- on heparin 4. RV failure- on dobutamine per Cardiology 5. Anemia of critical illness 6. VDRF- per PCCM 7. Shock- possibly related to PE and RV failure but also with staph UTI and possible sepsis.  On vanc/zosyn per PCCM 8. Hyperkalemia- resolved 9. Metabolic acidosis due to #1 improving with bicarb  Donetta Potts, MD [employer] [phone]

## 2017-05-03 NOTE — Progress Notes (Signed)
Progress Note  Patient Name: Joseph Downs Date of Encounter: 05/03/2017  Primary Cardiologist: Skeet Latch, MD   Subjective   Unable to obtain.  Patient intubated and sedated.  Inpatient Medications    Scheduled Meds: . aspirin  81 mg Per Tube Daily  . chlorhexidine gluconate (MEDLINE KIT)  15 mL Mouth Rinse BID  . insulin aspart  0-9 Units Subcutaneous TID WC  . ipratropium-albuterol  3 mL Nebulization Q6H  . mouth rinse  15 mL Mouth Rinse QID  . pantoprazole (PROTONIX) IV  40 mg Intravenous QHS  . thiamine injection  100 mg Intravenous Daily   Continuous Infusions: . sodium chloride    . feeding supplement (VITAL AF 1.2 CAL) 1,000 mL (05/03/17 0600)  . fentaNYL infusion INTRAVENOUS 150 mcg/hr (05/03/17 0600)  . heparin 1,200 Units/hr (05/03/17 0600)  . norepinephrine (LEVOPHED) Adult infusion 6 mcg/min (05/03/17 0600)  . piperacillin-tazobactam (ZOSYN)  IV Stopped (05/03/17 0258)  .  sodium bicarbonate (isotonic) infusion in sterile water 75 mL/hr at 05/03/17 0600  . vancomycin     PRN Meds: Place/Maintain arterial line **AND** sodium chloride, docusate, fentaNYL, midazolam   Vital Signs    Vitals:   05/03/17 0500 05/03/17 0600 05/03/17 0750 05/03/17 0800  BP: (!) 85/62 (!) 97/54  94/61  Pulse: 93 93  93  Resp: 19 17  17   Temp:   (!) 97.5 F (36.4 C)   TempSrc:   Oral   SpO2: 100% 99%  100%  Weight: 196 lb 13.9 oz (89.3 kg)     Height:        Intake/Output Summary (Last 24 hours) at 05/03/2017 0821 Last data filed at 05/03/2017 0600 Gross per 24 hour  Intake 6155.5 ml  Output 247 ml  Net 5908.5 ml   Filed Weights   05/01/17 1900 05/02/17 0500 05/03/17 0500  Weight: 184 lb 8.4 oz (83.7 kg) 184 lb 8.4 oz (83.7 kg) 196 lb 13.9 oz (89.3 kg)    Telemetry    Sinus rhythm.  Short runs of SVT.  Up to 10 beat run of NSVT.- Personally Reviewed  ECG sinus tachycardia.  Rate 102 bpm.  Nonspecific    Sinus tachycardia.  Rate 102 bpm.  Nonspecific IVCD  PVC.  Nonspecific ST changes..  - Personally Reviewed  Physical Exam   VS:  BP 94/61   Pulse 93   Temp (!) 97.5 F (36.4 C) (Oral)   Resp 17   Ht 5' 10"  (1.778 m)   Wt 196 lb 13.9 oz (89.3 kg)   SpO2 100%   BMI 28.25 kg/m  , BMI Body mass index is 28.25 kg/m. GENERAL:  Critically ill-appearing.  Intubated and sedated.  HEENT: Pupils equal round and reactive, fundi not visualized, oral mucosa unremarkable NECK:  + jugular venous distention, waveform within normal limits, carotid upstroke brisk and symmetric, no bruits, no thyromegaly LUNGS:  Clear to auscultation bilaterally on anterior exam. HEART:  RRR.  PMI not displaced or sustained,S1 and S2 within normal limits, no S3, no S4, no clicks, no rubs, no murmurs ABD:  Flat, positive bowel sounds normal in frequency in pitch, no bruits, no rebound, no guarding, no midline pulsatile mass, no hepatomegaly, no splenomegaly EXT:  2 plus pulses throughout, 2+ LE edema bilaterally, no cyanosis no clubbing SKIN:  No rashes no nodules NEURO:  Intubated and sedated.  Moves all four limbs freely PSYCH:  Unable to assess.  Intubated and sedated.    Labs    Chemistry  Recent Labs  Lab 05/02/17 1049 05/02/17 1835 05/03/17 0328  NA 144 148* 148*  K 6.5* 5.8* 4.9  CL 115* 116* 114*  CO2 14* 15* 18*  GLUCOSE 121* 132* 164*  BUN 82* 89* 96*  CREATININE 4.97* 5.76* 6.33*  CALCIUM 7.4* 7.1* 6.5*  PROT 6.0* 5.8* 5.5*  ALBUMIN 1.9* 1.9* 1.7*  AST 2,228* 1,471* 896*  ALT 957* 868* 716*  ALKPHOS 63 70 72  BILITOT 0.8 0.9 0.7  GFRNONAA 11* 9* 8*  GFRAA 13* 11* 10*  ANIONGAP 15 17* 16*     Hematology Recent Labs  Lab 05/02/17 0237 05/02/17 1835 05/03/17 0328  WBC 13.6* 20.2* 20.8*  RBC 3.38* 3.59* 3.27*  HGB 9.6* 10.4* 9.4*  HCT 30.3* 32.8* 29.5*  MCV 89.6 91.4 90.2  MCH 28.4 29.0 28.7  MCHC 31.7 31.7 31.9  RDW 13.7 14.3 14.2  PLT 88* 137* 112*    Cardiac Enzymes Recent Labs  Lab 05/02/17 0237 05/02/17 1049  05/02/17 1835 05/03/17 0328  TROPONINI 5.30* 5.65* 3.84* 2.29*   No results for input(s): TROPIPOC in the last 168 hours.   BNP Recent Labs  Lab 05/01/17 1954  BNP 208.4*     DDimer  Recent Labs  Lab 05/01/17 1954  DDIMER >20.00*     Radiology    LE Doppler 05/02/17:  + Bilateral DVTs  Cardiac Studies   Echo 05/02/17: Study Conclusions  - Left ventricle: The cavity size was normal. There was severe   concentric hypertrophy. Systolic function was vigorous. The   estimated ejection fraction was in the range of 65% to 70%.   Doppler parameters are consistent with abnormal left ventricular   relaxation (grade 1 diastolic dysfunction). The E/e&' ratio is <8,   suggesting normal LV filling pressure. - Aortic valve: Sclerosis without stenosis. There was no   regurgitation. - Aorta: Dilated aortic root. Aortic root dimension: 40 mm (ED). - Mitral valve: Mildly thickened leaflets . There was trivial   regurgitation. - Left atrium: The atrium was mildly dilated. - Right ventricle: Moderate to severely dilated and moderately   hypokinetic. Apical function is preserved (McConnell&'s sign).   Lateral annulus peak S velocity: 7.94 cm/s. - Right atrium: Moderately dilated. - Tricuspid valve: There was mild regurgitation. - Pulmonary arteries: PA peak pressure: 49 mm Hg (S). - Inferior vena cava: The vessel was dilated. The respirophasic   diameter changes were blunted (< 50%), consistent with elevated   central venous pressure. On vent.  Impressions:  - LVEF 65-70%, severe LVH, normal wall motion, grade 1 DD, normal   LV filling pressure, dilated aortic root to 4.0 cm, aortic   sclerosis with trivial AI, trivial MR, mild LAE, moderate to   severe RV dilitation with moderate hypokinesis with normal apical   function (McConnell&'s sign)- suggestive of RV strain (consider   pulmonary embolus), mild TR, RVSP 49 mmHg, dilated IVC (on vent).  Patient Profile     [id number] with  homelessness and no known past medical history here after being found down and hypothermic.   He was initially hypotensive and tachycardic and had a leukocytosis. CXR concerning for CAP, urine culture positive for  Staph lugdeunensis. Troponin was elevated to 2.8.  He was improving and then became responsive and subsequently developed hypoxic respiratory failure requiring intubation.  He was found to have extensive lower extremity DVTs and presumptively PE.  Echo positive for RV failure and McConnell's sign. Hospitalization complicated by AKI and oliguria.  Assessment & Plan    #  Pulmonary embolism: # Bilateral DVT: # RV failure: Patient likely decompensated 2/2 PE.  He remains on a heparin drip.  His right ventricle is failing.  He likely has mixed cardiogenic and septic shock.  Recommend placing a central line and checking co-ox.  Would start dobutamine at 2 mcg/kg/min and titrate for BP and UOP.  Hopefully urine output will increase with inotropic support of the RV.    # Elevated troponin: LV function is normal.  This is likely 2/2 RV strain in the setting of PE and demand ischemia.  No ischemia evaluation indicated at this time.  Consider when more stable.  # CAP: # Sepsis from a urinary source:  Query whether his CAP is pulmonary infarct.  Antibiotics per critical care.  PSA markedly elevated.  Likely prostate malignancy-->hypercoagulable state.  # AKI: Patient is oliguric and renal function continues to worsen.  Likely 2/2 hypotension and septic/cardiogenic shock.  Hopefully renal venous congestion will improve with dobutamine as above.  He is volume overloaded and will require diuresis once more stable.  Time spent: 60 minutes-Greater than 50% of this time was spent in counseling, explanation of diagnosis, planning of further management, and coordination of care.   For questions or updates, please contact [place] Please consult [email or web address] for contact info under  Cardiology/STEMI.      Signed, Skeet Latch, MD  05/03/2017, 8:21 AM

## 2017-05-03 NOTE — Progress Notes (Signed)
CRITICAL VALUE ALERT  Critical Value:  Calcium   Date & Time Notied:  05/03/2017 2223  Provider Notified: Idolina PrimerEubanks, K, NP   Orders Received/Actions taken: Give calcium gluconate

## 2017-05-03 NOTE — Progress Notes (Signed)
eLink Physician-Brief Progress Note Patient Name: Joseph FloridaLarry E Doenges DOB: 02-20-1951 MRN: [id number]   Date of Service  05/03/2017  HPI/Events of Note  Nurse called expressing concern for suspected frost biite in patient's feet. Tried to camera in but couldn't see the wound too well  eICU Interventions  Wound care consult. Silvadene dressing bid. Advised to check pedal pulses. As of now they are dopplerable.         [name] 05/03/2017, 7:18 PM

## 2017-05-03 NOTE — Procedures (Signed)
Central Venous dialysis Catheter Insertion Procedure Note Joseph Downs [id number] 13-Nov-1950  Procedure: Insertion of Central Venous Catheter Indications: dialysis   Procedure Details Consent: Risks of procedure as well as the alternatives and risks of each were explained to the (patient/caregiver).  Consent for procedure obtained. Time Out: Verified patient identification, verified procedure, site/side was marked, verified correct patient position, special equipment/implants available, medications/allergies/relevent history reviewed, required imaging and test results available.  Performed Real time us was used to ID and cannulate the vessel  Maximum sterile technique was used including antiseptics, cap, gloves, gown, hand hygiene, mask and sheet. Skin prep: Chlorhexidine; local anesthetic administered A antimicrobial bonded/coated triple lumen catheter was placed in the right internal jugular vein using the Seldinger technique.  Evaluation Blood flow good Complications: No apparent complications Patient did tolerate procedure well. Chest X-ray ordered to verify placement.  CXR: pending  .  [name] E [name] 05/03/2017, 3:20 PM  Simonne MartinetPeter E Juhi Lagrange ACNP-BC [facility]monary/Critical Care Pager # [phone] OR # [phone] if no answer

## 2017-05-04 ENCOUNTER — Inpatient Hospital Stay (HOSPITAL_COMMUNITY): Payer: Medicare Other

## 2017-05-04 DIAGNOSIS — N170 Acute kidney failure with tubular necrosis: Secondary | ICD-10-CM

## 2017-05-04 DIAGNOSIS — L8990 Pressure ulcer of unspecified site, unspecified stage: Secondary | ICD-10-CM

## 2017-05-04 LAB — RENAL FUNCTION PANEL
ALBUMIN: 1.7 g/dL — AB (ref 3.5–5.0)
Albumin: 1.6 g/dL — ABNORMAL LOW (ref 3.5–5.0)
Anion gap: 17 — ABNORMAL HIGH (ref 5–15)
Anion gap: 13 (ref 5–15)
BUN: 113 mg/dL — AB (ref 6–20)
BUN: 97 mg/dL — ABNORMAL HIGH (ref 6–20)
CALCIUM: 6.5 mg/dL — AB (ref 8.9–10.3)
CHLORIDE: 108 mmol/L (ref 101–111)
CO2: 21 mmol/L — ABNORMAL LOW (ref 22–32)
CO2: 24 mmol/L (ref 22–32)
CREATININE: 7.51 mg/dL — AB (ref 0.61–1.24)
Calcium: 6.5 mg/dL — ABNORMAL LOW (ref 8.9–10.3)
Chloride: 109 mmol/L (ref 101–111)
Creatinine, Ser: 6.03 mg/dL — ABNORMAL HIGH (ref 0.61–1.24)
GFR calc Af Amer: 8 mL/min — ABNORMAL LOW (ref >60)
GFR calc non Af Amer: 9 mL/min — ABNORMAL LOW (ref >60)
GFR, EST AFRICAN AMERICAN: 10 mL/min — AB (ref >60)
GFR, EST NON AFRICAN AMERICAN: 7 mL/min — AB (ref >60)
Glucose, Bld: 213 mg/dL — ABNORMAL HIGH (ref 65–99)
Glucose, Bld: 184 mg/dL — ABNORMAL HIGH (ref 65–99)
PHOSPHORUS: 7 mg/dL — AB (ref 2.5–4.6)
Phosphorus: 5.6 mg/dL — ABNORMAL HIGH (ref 2.5–4.6)
Potassium: 4.2 mmol/L (ref 3.5–5.1)
Potassium: 5.6 mmol/L — ABNORMAL HIGH (ref 3.5–5.1)
SODIUM: 147 mmol/L — AB (ref 135–145)
Sodium: 145 mmol/L (ref 135–145)

## 2017-05-04 LAB — COOXEMETRY PANEL
CARBOXYHEMOGLOBIN: 1 % (ref 0.5–1.5)
Carboxyhemoglobin: 0.8 % (ref 0.5–1.5)
Carboxyhemoglobin: 0.7 % (ref 0.5–1.5)
METHEMOGLOBIN: 0.7 % (ref 0.0–1.5)
Methemoglobin: 1.2 % (ref 0.0–1.5)
Methemoglobin: 0.7 % (ref 0.0–1.5)
O2 SAT: 59 %
O2 Saturation: 53.3 %
O2 Saturation: 63.7 %
TOTAL HEMOGLOBIN: 8.9 g/dL — AB (ref 12.0–16.0)
TOTAL HEMOGLOBIN: 8.6 g/dL — AB (ref 12.0–16.0)
Total hemoglobin: 8.9 g/dL — ABNORMAL LOW (ref 12.0–16.0)

## 2017-05-04 LAB — GLUCOSE, CAPILLARY
Glucose-Capillary: 205 mg/dL — ABNORMAL HIGH (ref 65–99)
Glucose-Capillary: 187 mg/dL — ABNORMAL HIGH (ref 65–99)
Glucose-Capillary: 161 mg/dL — ABNORMAL HIGH (ref 65–99)
Glucose-Capillary: 179 mg/dL — ABNORMAL HIGH (ref 65–99)
Glucose-Capillary: 138 mg/dL — ABNORMAL HIGH (ref 65–99)

## 2017-05-04 LAB — CBC
HEMATOCRIT: 26.1 % — AB (ref 39.0–52.0)
HEMOGLOBIN: 8.4 g/dL — AB (ref 13.0–17.0)
MCH: 28.5 pg (ref 26.0–34.0)
MCHC: 32.2 g/dL (ref 30.0–36.0)
MCV: 88.5 fL (ref 78.0–100.0)
Platelets: 113 10*3/uL — ABNORMAL LOW (ref 150–400)
RBC: 2.95 MIL/uL — ABNORMAL LOW (ref 4.22–5.81)
RDW: 14.2 % (ref 11.5–15.5)
WBC: 18.2 10*3/uL — ABNORMAL HIGH (ref 4.0–10.5)

## 2017-05-04 LAB — HEPARIN LEVEL (UNFRACTIONATED)
Heparin Unfractionated: <0.1 IU/mL — ABNORMAL LOW (ref 0.30–0.70)
Heparin Unfractionated: 0.17 IU/mL — ABNORMAL LOW (ref 0.30–0.70)
Heparin Unfractionated: 0.51 IU/mL (ref 0.30–0.70)

## 2017-05-04 LAB — PHOSPHORUS
PHOSPHORUS: 6.9 mg/dL — AB (ref 2.5–4.6)
Phosphorus: 5.6 mg/dL — ABNORMAL HIGH (ref 2.5–4.6)

## 2017-05-04 LAB — CK: CK TOTAL: 3649 U/L — AB (ref 49–397)

## 2017-05-04 MED ORDER — VANCOMYCIN HCL IN DEXTROSE 1-5 GM/200ML-% IV SOLN
1000.0000 mg | INTRAVENOUS | Status: AC
  Administered 2017-05-04 – 2017-05-07 (×4): 1000 mg via INTRAVENOUS
  Filled 2017-05-04 (×4): qty 200

## 2017-05-04 MED ORDER — HEPARIN (PORCINE) 2000 UNITS/L FOR CRRT
INTRAVENOUS_CENTRAL | Status: DC | PRN
  Filled 2017-05-04: qty 1000

## 2017-05-04 MED ORDER — PRISMASOL BGK 4/2.5 32-4-2.5 MEQ/L IV SOLN
INTRAVENOUS | Status: DC
  Administered 2017-05-04 – 2017-05-07 (×14): via INTRAVENOUS_CENTRAL
  Filled 2017-05-04 (×23): qty 5000

## 2017-05-04 MED ORDER — PRISMASOL BGK 4/2.5 32-4-2.5 MEQ/L IV SOLN
INTRAVENOUS | Status: DC
  Administered 2017-05-04 – 2017-05-07 (×7): via INTRAVENOUS_CENTRAL
  Filled 2017-05-04 (×13): qty 5000

## 2017-05-04 MED ORDER — INSULIN ASPART 100 UNIT/ML ~~LOC~~ SOLN
2.0000 [IU] | SUBCUTANEOUS | Status: DC
  Administered 2017-05-04 (×2): 4 [IU] via SUBCUTANEOUS
  Administered 2017-05-04: 2 [IU] via SUBCUTANEOUS
  Administered 2017-05-05: 4 [IU] via SUBCUTANEOUS
  Administered 2017-05-05 (×3): 2 [IU] via SUBCUTANEOUS
  Administered 2017-05-05: 6 [IU] via SUBCUTANEOUS
  Administered 2017-05-06 (×4): 2 [IU] via SUBCUTANEOUS
  Administered 2017-05-07: 4 [IU] via SUBCUTANEOUS
  Administered 2017-05-07: 2 [IU] via SUBCUTANEOUS
  Administered 2017-05-07: 4 [IU] via SUBCUTANEOUS
  Administered 2017-05-07 (×2): 2 [IU] via SUBCUTANEOUS
  Administered 2017-05-07 – 2017-05-08 (×4): 4 [IU] via SUBCUTANEOUS
  Administered 2017-05-08 (×2): 2 [IU] via SUBCUTANEOUS
  Administered 2017-05-09: 4 [IU] via SUBCUTANEOUS
  Administered 2017-05-09: 2 [IU] via SUBCUTANEOUS
  Administered 2017-05-09 (×2): 4 [IU] via SUBCUTANEOUS
  Administered 2017-05-09 – 2017-05-10 (×3): 2 [IU] via SUBCUTANEOUS
  Administered 2017-05-10: 6 [IU] via SUBCUTANEOUS
  Administered 2017-05-10 (×2): 2 [IU] via SUBCUTANEOUS
  Administered 2017-05-11: 4 [IU] via SUBCUTANEOUS
  Administered 2017-05-11 (×2): 2 [IU] via SUBCUTANEOUS
  Administered 2017-05-11 (×2): 4 [IU] via SUBCUTANEOUS
  Administered 2017-05-11 – 2017-05-12 (×5): 2 [IU] via SUBCUTANEOUS
  Administered 2017-05-12 – 2017-05-13 (×2): 4 [IU] via SUBCUTANEOUS
  Administered 2017-05-13 – 2017-05-14 (×6): 2 [IU] via SUBCUTANEOUS
  Administered 2017-05-14 (×2): 4 [IU] via SUBCUTANEOUS
  Administered 2017-05-14 – 2017-05-25 (×28): 2 [IU] via SUBCUTANEOUS
  Administered 2017-05-25: 4 [IU] via SUBCUTANEOUS
  Administered 2017-05-26: 2 [IU] via SUBCUTANEOUS
  Administered 2017-05-26 (×2): 4 [IU] via SUBCUTANEOUS
  Administered 2017-05-26 – 2017-05-27 (×5): 2 [IU] via SUBCUTANEOUS
  Administered 2017-05-27: 4 [IU] via SUBCUTANEOUS
  Administered 2017-05-27 – 2017-05-28 (×4): 2 [IU] via SUBCUTANEOUS
  Administered 2017-05-28: 4 [IU] via SUBCUTANEOUS
  Administered 2017-05-28 – 2017-05-29 (×4): 2 [IU] via SUBCUTANEOUS

## 2017-05-04 MED ORDER — FUROSEMIDE 10 MG/ML IJ SOLN
80.0000 mg | Freq: Two times a day (BID) | INTRAMUSCULAR | Status: DC
  Administered 2017-05-04 – 2017-05-08 (×9): 80 mg via INTRAVENOUS
  Filled 2017-05-04 (×12): qty 8

## 2017-05-04 MED ORDER — PRISMASOL BGK 4/2.5 32-4-2.5 MEQ/L IV SOLN
INTRAVENOUS | Status: DC
  Administered 2017-05-04 – 2017-05-07 (×6): via INTRAVENOUS_CENTRAL
  Filled 2017-05-04 (×9): qty 5000

## 2017-05-04 MED ORDER — PIPERACILLIN-TAZOBACTAM 3.375 G IVPB
3.3750 g | Freq: Four times a day (QID) | INTRAVENOUS | Status: DC
  Administered 2017-05-04 – 2017-05-06 (×8): 3.375 g via INTRAVENOUS
  Filled 2017-05-04 (×10): qty 50

## 2017-05-04 MED ORDER — HEPARIN SODIUM (PORCINE) 1000 UNIT/ML DIALYSIS
1000.0000 [IU] | INTRAMUSCULAR | Status: DC | PRN
Start: 2017-05-04 — End: 2017-05-09
  Administered 2017-05-07: 1200 [IU] via INTRAVENOUS_CENTRAL
  Filled 2017-05-04 (×2): qty 6
  Filled 2017-05-04: qty 3

## 2017-05-04 NOTE — Progress Notes (Signed)
ANTICOAGULATION CONSULT NOTE - Follow- Up Consult  Pharmacy Consult for Heparin  Indication: chest pain/ACS / new BL DVTs/possible PE  No Known Allergies  Patient Measurements: Height: 5\' 10"  (177.8 cm) Weight: 203 lb 7.8 oz (92.3 kg) IBW/kg (Calculated) : 73  Vital Signs: Temp: 98 F (36.7 C) (02/16 0946) Temp Source: Axillary (02/16 0946) BP: 111/70 (02/16 1100) Pulse Rate: 99 (02/16 1100)  Labs: Recent Labs    05/01/17 1954  05/02/17 1049 05/02/17 1835  05/03/17 0328 05/03/17 0930 05/03/17 1000 05/03/17 1915 05/04/17 0534 05/04/17 0935  HGB 11.8*   < >  --  10.4*  --  9.4*  --   --   --  8.4*  --   HCT 36.9*   < >  --  32.8*  --  29.5*  --   --   --  26.1*  --   PLT 96*   < >  --  137*  --  112*  --   --   --  113*  --   HEPARINUNFRC  --    < >  --   --    < >  --  0.44  --   --  <0.10* 0.17*  CREATININE 3.35*  --  4.97* 5.76*  --  6.33*  --  6.66* 6.37* 7.51*  --   CKTOTAL 2,933*  --   --   --   --  4,620*  --   --   --  3,649*  --   TROPONINI 2.83*   < > 5.65* 3.84*  --  2.29*  --   --   --   --   --    < > = values in this interval not displayed.    Estimated Creatinine Clearance: 11 mL/min (A) (by C-G formula based on SCr of 7.51 mg/dL (H)).  Assessment: [name] in the ICU after being "found down". His troponin is elevated and now trending down. Patient has now been found to have bilateral lower extremity with concern for possible PE. Hep lvl low at 0.17  Hep was off early this am for central line placement  Goal of Therapy:  Heparin level 0.3-0.7 units/ml Monitor platelets by anticoagulation protocol: Yes   Plan:  Increase heparin to 1350 units/hr 2100 HL Daily HL CBC  Isaac BlissMichael Evette Diclemente, PharmD, BCPS, BCCCP Clinical Pharmacist Clinical phone for 05/04/2017 from 7a-3:30p: [phone] If after 3:30p, please call main pharmacy at: [phone] 05/04/2017 12:10 PM

## 2017-05-04 NOTE — Progress Notes (Signed)
Progress Note  Patient Name: Joseph Downs Date of Encounter: 05/04/2017  Primary Cardiologist: Skeet Latch, MD   Subjective   Unable to obtain.  Patient intubated and sedated.  Inpatient Medications    Scheduled Meds: . aspirin  81 mg Per Tube Daily  . chlorhexidine gluconate (MEDLINE KIT)  15 mL Mouth Rinse BID  . free water  100 mL Per Tube Q6H  . furosemide  80 mg Intravenous Q12H  . insulin aspart  0-9 Units Subcutaneous TID WC  . ipratropium-albuterol  3 mL Nebulization Q6H  . mouth rinse  15 mL Mouth Rinse QID  . pantoprazole (PROTONIX) IV  40 mg Intravenous QHS  . silver sulfADIAZINE   Topical BID  . thiamine injection  100 mg Intravenous Daily   Continuous Infusions: . sodium chloride    . dexmedetomidine (PRECEDEX) IV infusion 0.399 mcg/kg/hr (05/04/17 0600)  . DOBUTamine 2 mcg/kg/min (05/04/17 0600)  . feeding supplement (VITAL AF 1.2 CAL) 1,000 mL (05/04/17 0600)  . fentaNYL infusion INTRAVENOUS 100 mcg/hr (05/04/17 0600)  . heparin Stopped (05/04/17 0048)  . norepinephrine (LEVOPHED) Adult infusion 12 mcg/min (05/04/17 0600)  . piperacillin-tazobactam (ZOSYN)  IV Stopped (05/04/17 0317)  .  sodium bicarbonate (isotonic) infusion in sterile water 75 mL/hr at 05/04/17 0600  . vancomycin Stopped (05/03/17 1610)   PRN Meds: Place/Maintain arterial line **AND** sodium chloride, docusate, fentaNYL, midazolam   Vital Signs    Vitals:   05/04/17 0400 05/04/17 0500 05/04/17 0600 05/04/17 0755  BP: 101/65 (!) 109/57    Pulse: 94 98 99   Resp: 18 19 (!) 29   Temp:      TempSrc:      SpO2: 97% 96% 91% 97%  Weight:  203 lb 7.8 oz (92.3 kg)    Height:        Intake/Output Summary (Last 24 hours) at 05/04/2017 0759 Last data filed at 05/04/2017 0600 Gross per 24 hour  Intake 4831.66 ml  Output 500 ml  Net 4331.66 ml   Filed Weights   05/02/17 0500 05/03/17 0500 05/04/17 0500  Weight: 184 lb 8.4 oz (83.7 kg) 196 lb 13.9 oz (89.3 kg) 203 lb 7.8 oz  (92.3 kg)   CVP 14  Telemetry    Sinus rhythm.  No events.- Personally Reviewed  ECG sinus tachycardia.  Rate 102 bpm.  Nonspecific    Sinus tachycardia.  Rate 102 bpm.  Nonspecific IVCD PVC.  Nonspecific ST changes..  - Personally Reviewed  Physical Exam   VS:  BP (!) 109/57   Pulse 99   Temp 98.4 F (36.9 C) (Oral)   Resp (!) 29   Ht _0  (1.778 m)   Wt 203 lb 7.8 oz (92.3 kg)   SpO2 97%   BMI 29.20 kg/m  , BMI Body mass index is 29.2 kg/m. GENERAL:  Critically ill-appearing.  Intubated and sedated.  HEENT: Pupils equal round and reactive, fundi not visualized, oral mucosa unremarkable NECK:  + jugular venous distention, waveform within normal limits, carotid upstroke brisk and symmetric, no bruits, no thyromegaly LUNGS:  Clear to auscultation bilaterally on anterior exam. HEART:  RRR.  PMI not displaced or sustained,S1 and S2 within normal limits, no S3, no S4, no clicks, no rubs, no murmurs ABD:  Flat, positive bowel sounds normal in frequency in pitch, no bruits, no rebound, no guarding, no midline pulsatile mass, no hepatomegaly, no splenomegaly EXT:  2 plus pulses throughout, 2+ LE edema bilaterally, no cyanosis no clubbing SKIN:  No rashes no nodules NEURO:  Intubated and sedated.  Moves all four limbs freely PSYCH:  Unable to assess.  Intubated and sedated.    Labs    Chemistry Recent Labs  Lab 05/02/17 1049 05/02/17 1835 05/03/17 0328 05/03/17 1000 05/03/17 1915 05/04/17 0534  NA 144 148* 148* 149* 150* 147*  K 6.5* 5.8* 4.9 4.4 4.2 4.2  CL 115* 116* 114* 113* 114* 109  CO2 14* 15* 18* 19* 20* 21*  GLUCOSE 121* 132* 164* 173* 135* 213*  BUN 82* 89* 96* 102* 100* 113*  CREATININE 4.97* 5.76* 6.33* 6.66* 6.37* 7.51*  CALCIUM 7.4* 7.1* 6.5* 6.2* 6.0* 6.5*  PROT 6.0* 5.8* 5.5*  --   --   --   ALBUMIN 1.9* 1.9* 1.7*  --   --  1.7*  AST 2,228* 1,471* 896*  --   --   --   ALT 957* 868* 716*  --   --   --   ALKPHOS 63 70 72  --   --   --   BILITOT 0.8  0.9 0.7  --   --   --   GFRNONAA 11* 9* 8* 8* 8* 7*  GFRAA 13* 11* 10* 9* 9* 8*  ANIONGAP 15 17* 16* 17* 16* 17*     Hematology Recent Labs  Lab 05/02/17 1835 05/03/17 0328 05/04/17 0534  WBC 20.2* 20.8* 18.2*  RBC 3.59* 3.27* 2.95*  HGB 10.4* 9.4* 8.4*  HCT 32.8* 29.5* 26.1*  MCV 91.4 90.2 88.5  MCH 29.0 28.7 28.5  MCHC 31.7 31.9 32.2  RDW 14.3 14.2 14.2  PLT 137* 112* 113*    Cardiac Enzymes Recent Labs  Lab 05/02/17 0237 05/02/17 1049 05/02/17 1835 05/03/17 0328  TROPONINI 5.30* 5.65* 3.84* 2.29*   No results for input(s): TROPIPOC in the last 168 hours.   BNP Recent Labs  Lab 05/01/17 1954  BNP 208.4*     DDimer  Recent Labs  Lab 05/01/17 1954  DDIMER >20.00*     Radiology    LE Doppler 05/02/17:  + Bilateral DVTs  Cardiac Studies   Echo 05/02/17: Study Conclusions  - Left ventricle: The cavity size was normal. There was severe   concentric hypertrophy. Systolic function was vigorous. The   estimated ejection fraction was in the range of 65% to 70%.   Doppler parameters are consistent with abnormal left ventricular   relaxation (grade 1 diastolic dysfunction). The E/e&' ratio is <8,   suggesting normal LV filling pressure. - Aortic valve: Sclerosis without stenosis. There was no   regurgitation. - Aorta: Dilated aortic root. Aortic root dimension: 40 mm (ED). - Mitral valve: Mildly thickened leaflets . There was trivial   regurgitation. - Left atrium: The atrium was mildly dilated. - Right ventricle: Moderate to severely dilated and moderately   hypokinetic. Apical function is preserved (McConnell&'s sign).   Lateral annulus peak S velocity: 7.94 cm/s. - Right atrium: Moderately dilated. - Tricuspid valve: There was mild regurgitation. - Pulmonary arteries: PA peak pressure: 49 mm Hg (S). - Inferior vena cava: The vessel was dilated. The respirophasic   diameter changes were blunted (< 50%), consistent with elevated   central venous  pressure. On vent.  Impressions:  - LVEF 65-70%, severe LVH, normal wall motion, grade 1 DD, normal   LV filling pressure, dilated aortic root to 4.0 cm, aortic   sclerosis with trivial AI, trivial MR, mild LAE, moderate to   severe RV dilitation with moderate hypokinesis with normal apical  function (McConnell&'s sign)- suggestive of RV strain (consider   pulmonary embolus), mild TR, RVSP 49 mmHg, dilated IVC (on vent).  Patient Profile     [phone] with homelessness and no known past medical history here after being found down and hypothermic.   He was initially hypotensive and tachycardic and had a leukocytosis. CXR concerning for CAP, urine culture positive for  Staph lugdeunensis. Troponin was elevated to 2.8.  He was improving and then became responsive and subsequently developed hypoxic respiratory failure requiring intubation.  He was found to have extensive lower extremity DVTs and presumptively PE.  Echo positive for RV failure and McConnell's sign. Hospitalization complicated by AKI and oliguria.  Assessment & Plan    # Pulmonary embolism: # Bilateral DVT: # RV failure: Patient likely decompensated 2/2 PE.  He remains on a heparin drip.  His right ventricle is failing.  Co-ox was 46 and increased to 53 on dobutamine.  Would try to wean off levophed and increase dobutamine as needed.  CVP is 14.  Will give a trial of IV lasix.  He is also receiving free water supplementation for hypernatremia.  However he is massively volume overloaded.  If he fails lasix he will need CVVHD per nephrology.    # Elevated troponin: LV function is normal.  This is likely 2/2 RV strain in the setting of PE and demand ischemia.  No ischemia evaluation indicated at this time.  Consider when more stable.  # CAP: # Sepsis from a urinary source:  Query whether his CAP is pulmonary infarct.  Antibiotics per critical care.  Leukocytosis slightly better today.  PSA markedly elevated.  ?prostate  malignancy-->hypercoagulable state.  # AKI: Urine output was slightly better yesterday but remains poor.  Creatinine continues to rise.  Likely 2/2 hypotension and septic/cardiogenic shock.  Hopefully renal venous congestion will improve with dobutamine as above.  Trial of lasix as above.    Time spent: 45 minutes-Greater than 50% of this time was spent in counseling, explanation of diagnosis, planning of further management, and coordination of care.   For questions or updates, please contact [place] Please consult [email or web address] for contact info under Cardiology/STEMI.      Signed, Skeet Latch, MD  05/04/2017, 7:59 AM

## 2017-05-04 NOTE — Progress Notes (Signed)
S: No events overnight. O:BP 113/70   Pulse 95   Temp 98.4 F (36.9 C) (Oral)   Resp 18   Ht 5' 10"  (1.778 m)   Wt 92.3 kg (203 lb 7.8 oz)   SpO2 97%   BMI 29.20 kg/m   Intake/Output Summary (Last 24 hours) at 05/04/2017 0922 Last data filed at 05/04/2017 0811 Gross per 24 hour  Intake 4588.86 ml  Output 635 ml  Net 3953.86 ml   Intake/Output: I/O last 3 completed shifts: In: 8211.2 [I.V.:5847.5; NG/GT:2263.7; IV Piggyback:100] Out: 625 [Urine:625]  Intake/Output this shift:  Total I/O In: -  Out: 185 [Urine:175; Stool:10] Weight change: 3 kg (6 lb 9.8 oz) KAJ:[id number] and sedated  CVS: no rub Resp: occ rhonchi Abd: +BS Ext:1+ edema  Recent Labs  Lab 05/01/17 1110 05/01/17 1954 05/02/17 0237 05/02/17 1049 05/02/17 1835 05/03/17 0328 05/03/17 1000 05/03/17 1915 05/04/17 0534  NA 145  --   --  144 148* 148* 149* 150* 147*  K 4.1  --   --  6.5* 5.8* 4.9 4.4 4.2 4.2  CL 110  --   --  115* 116* 114* 113* 114* 109  CO2 14*  --   --  14* 15* 18* 19* 20* 21*  GLUCOSE 218*  --   --  121* 132* 164* 173* 135* 213*  BUN 58*  --   --  82* 89* 96* 102* 100* 113*  CREATININE 2.50* 3.35*  --  4.97* 5.76* 6.33* 6.66* 6.37* 7.51*  ALBUMIN 2.3*  --   --  1.9* 1.9* 1.7*  --   --  1.7*  CALCIUM 8.7*  --   --  7.4* 7.1* 6.5* 6.2* 6.0* 6.5*  PHOS  --   --  6.2*  --   --   --   --  6.8* 6.9*  7.0*  AST 81*  --   --  2,228* 1,471* 896*  --   --   --   ALT 83*  --   --  957* 868* 716*  --   --   --    Liver Function Tests: Recent Labs  Lab 05/02/17 1049 05/02/17 1835 05/03/17 0328 05/04/17 0534  AST 2,228* 1,471* 896*  --   ALT 957* 868* 716*  --   ALKPHOS 63 70 72  --   BILITOT 0.8 0.9 0.7  --   PROT 6.0* 5.8* 5.5*  --   ALBUMIN 1.9* 1.9* 1.7* 1.7*   No results for input(s): LIPASE, AMYLASE in the last 168 hours. No results for input(s): AMMONIA in the last 168 hours. CBC: Recent Labs  Lab 05/01/17 1110 05/01/17 1954 05/02/17 0237 05/02/17 1835 05/03/17 0328  05/04/17 0534  WBC 15.6* 18.9* 13.6* 20.2* 20.8* 18.2*  NEUTROABS 13.0*  --   --   --   --   --   HGB 13.1 11.8* 9.6* 10.4* 9.4* 8.4*  HCT 41.3 36.9* 30.3* 32.8* 29.5* 26.1*  MCV 92.0 89.8 89.6 91.4 90.2 88.5  PLT 181 96* 88* 137* 112* 113*   Cardiac Enzymes: Recent Labs  Lab 05/01/17 1954 05/02/17 0237 05/02/17 1049 05/02/17 1835 05/03/17 0328 05/04/17 0534  CKTOTAL 2,933*  --   --   --  4,620* 3,649*  TROPONINI 2.83* 5.30* 5.65* 3.84* 2.29*  --    CBG: Recent Labs  Lab 05/03/17 1308 05/03/17 1637 05/03/17 2113 05/03/17 2359 05/04/17 0344  GLUCAP 134* 148* 191* 192* 205*    Iron Studies: No results for input(s): IRON, TIBC,  TRANSFERRIN, FERRITIN in the last 72 hours. Studies/Results: Dg Chest Port 1 View  Result Date: 05/04/2017 CLINICAL DATA:  Central line placement. EXAM: PORTABLE CHEST 1 VIEW COMPARISON:  Chest radiograph May 03, 2016 FINDINGS: Endotracheal tube tip projects 3.6 cm above the carina. Large bore catheter RIGHT internal jugular vein with distal tip projecting in proximal superior vena cava. LEFT internal jugular central venous catheter distal tip projects in proximal superior vena cava. Nasogastric tube past proximal stomach, distal tip out of field-of-view. Cardiac silhouette is mildly enlarged, mediastinal silhouette is non suspicious. Bibasilar strandy densities. No pleural effusion or focal consolidation. No pneumothorax. Soft tissue planes and included osseous structures are unchanged. IMPRESSION: New LEFT internal jugular central venous catheter distal tip projects in proximal superior vena cava. No pneumothorax. No apparent change remaining life support lines. Mild cardiomegaly.  Bibasilar atelectasis/scarring. Electronically Signed   By: Elon Alas M.D.   On: 05/04/2017 06:17   Dg Chest Port 1 View  Result Date: 05/03/2017 CLINICAL DATA:  Status post central line placement today. EXAM: PORTABLE CHEST 1 VIEW COMPARISON:  Single-view of the  chest 05/01/2017. FINDINGS: The patient has a new right IJ approach central venous catheter with tip in the mid superior vena cava. ETT and NG tube are unchanged. Bibasilar atelectasis appears slightly worse on the left than on the prior study. No pleural effusion. Heart size is normal. IMPRESSION: Right IJ catheter tip projects in the mid superior vena cava. Negative for pneumothorax. Bibasilar atelectasis. Electronically Signed   By: Inge Rise M.D.   On: 05/03/2017 15:39   Vas [place] Lower Extremity Venous (dvt)  Result Date: 05/02/2017  Lower Venous Study Indication: Swelling, and Erythema. Risk Factors: DVT history. Examination Guidelines: A complete evaluation includes B-mode imaging, spectral doppler, color doppler, and power doppler as needed of all accessible portions of each vessel. Bilateral testing is considered an integral part of a complete examination. Limited examinations for reoccurring indications may be performed as noted.  Right Venous Findings: +---------+---------------+---------+-----------+----------+-------+          CompressibilityPhasicitySpontaneityPropertiesSummary +---------+---------------+---------+-----------+----------+-------+ CFV      Full                                                 +---------+---------------+---------+-----------+----------+-------+ FV Prox  Full                                                 +---------+---------------+---------+-----------+----------+-------+ FV Mid   Full                                                 +---------+---------------+---------+-----------+----------+-------+ FV DistalFull                                                 +---------+---------------+---------+-----------+----------+-------+ PFV      Full                                                 +---------+---------------+---------+-----------+----------+-------+  POP      Full                                                  +---------+---------------+---------+-----------+----------+-------+ PTV      None                               dilated   Acute   +---------+---------------+---------+-----------+----------+-------+ PERO     None                               dilated   Acute   +---------+---------------+---------+-----------+----------+-------+  Left Venous Findings: +---------+---------------+---------+-----------+----------+-------+          CompressibilityPhasicitySpontaneityPropertiesSummary +---------+---------------+---------+-----------+----------+-------+ CFV      Full           Yes      Yes                          +---------+---------------+---------+-----------+----------+-------+ FV Prox  None                               dilated   Acute   +---------+---------------+---------+-----------+----------+-------+ FV Mid   None                               dilated   Acute   +---------+---------------+---------+-----------+----------+-------+ FV DistalNone                               dilated   Acute   +---------+---------------+---------+-----------+----------+-------+ PFV      Full                                                 +---------+---------------+---------+-----------+----------+-------+ POP      None           No       No         dilated   Acute   +---------+---------------+---------+-----------+----------+-------+ PTV      None                               dilated   Acute   +---------+---------------+---------+-----------+----------+-------+ PERO     None                               dilated   Acute   +---------+---------------+---------+-----------+----------+-------+    Final Interpretation: Right: There is evidence of acute DVT in the Posterior Tibial veins, and Peroneal veins. Left: There is evidence of acute DVT in the Femoral vein, Popliteal vein, Posterior Tibial veins, and Peroneal veins.  *See table(s) above for  measurements and observations. Electronically signed by Servando Snare on 05/02/2017 at 3:18:26 PM.   Final   . aspirin  81 mg Per Tube Daily  . chlorhexidine gluconate (MEDLINE KIT)  15 mL Mouth Rinse BID  .  free water  100 mL Per Tube Q6H  . furosemide  80 mg Intravenous Q12H  . insulin aspart  0-9 Units Subcutaneous TID WC  . ipratropium-albuterol  3 mL Nebulization Q6H  . mouth rinse  15 mL Mouth Rinse QID  . pantoprazole (PROTONIX) IV  40 mg Intravenous QHS  . silver sulfADIAZINE   Topical BID  . thiamine injection  100 mg Intravenous Daily    BMET    Component Value Date/Time   NA 147 (H) 05/04/2017 0534   K 4.2 05/04/2017 0534   CL 109 05/04/2017 0534   CO2 21 (L) 05/04/2017 0534   GLUCOSE 213 (H) 05/04/2017 0534   BUN 113 (H) 05/04/2017 0534   CREATININE 7.51 (H) 05/04/2017 0534   CALCIUM 6.5 (L) 05/04/2017 0534   GFRNONAA 7 (L) 05/04/2017 0534   GFRAA 8 (L) 05/04/2017 0534   CBC    Component Value Date/Time   WBC 18.2 (H) 05/04/2017 0534   RBC 2.95 (L) 05/04/2017 0534   HGB 8.4 (L) 05/04/2017 0534   HCT 26.1 (L) 05/04/2017 0534   PLT 113 (L) 05/04/2017 0534   MCV 88.5 05/04/2017 0534   MCH 28.5 05/04/2017 0534   MCHC 32.2 05/04/2017 0534   RDW 14.2 05/04/2017 0534   LYMPHSABS 1.7 05/01/2017 1110   MONOABS 0.9 05/01/2017 1110   EOSABS 0.0 05/01/2017 1110   BASOSABS 0.0 05/01/2017 1110    Assessment/Plan:  1. AKI- in setting of shock (hemodynamically significant PE with RV failure), hypovolemia, and mild rhabdomyolyisis (he also has history of urinary retention and a previous admission with AKI treated with Foley catheter).   1. Remains oliguric with worsening azotemia.  I discussed case with PCCM and will start CVVHDF today.   2. Obstructive uropathy- possibly related to prostate cancer given presence of staph lugdunensis and markedly elevated PSA.  Foley in place  3. Bilateral DVT and presumed PE with RV failure- on heparin 4. RV failure- on dobutamine per  Cardiology 5. Anemia of critical illness 6. VDRF- per PCCM 7. Shock- possibly related to PE and RV failure but also with staph UTI and possible sepsis.  On vanc/zosyn per PCCM 8. Hyperkalemia- resolved 9. Metabolic acidosis due to #1 improving with bicarb (will stop once CVVHD initiated)   Donetta Potts, MD [facility] [phone]

## 2017-05-04 NOTE — Progress Notes (Signed)
ANTICOAGULATION CONSULT NOTE - Follow- Up Consult  Pharmacy Consult for Heparin  Indication: chest pain/ACS / new BL DVTs/possible PE  No Known Allergies  Patient Measurements: Height: 5\' 10"  (177.8 cm) Weight: 203 lb 7.8 oz (92.3 kg) IBW/kg (Calculated) : 73  Vital Signs: Temp: 99.3 F (37.4 C) (02/16 2026) Temp Source: Oral (02/16 2026) BP: 112/65 (02/16 2100) Pulse Rate: 125 (02/16 2100)  Labs: Recent Labs    05/02/17 1049 05/02/17 1835  05/03/17 0328  05/03/17 1915 05/04/17 0534 05/04/17 0935 05/04/17 1447 05/04/17 2053  HGB  --  10.4*  --  9.4*  --   --  8.4*  --   --   --   HCT  --  32.8*  --  29.5*  --   --  26.1*  --   --   --   PLT  --  137*  --  112*  --   --  113*  --   --   --   HEPARINUNFRC  --   --    < >  --    < >  --  <0.10* 0.17*  --  0.51  CREATININE 4.97* 5.76*  --  6.33*   < > 6.37* 7.51*  --  6.03*  --   CKTOTAL  --   --   --  4,620*  --   --  3,649*  --   --   --   TROPONINI 5.65* 3.84*  --  2.29*  --   --   --   --   --   --    < > = values in this interval not displayed.    Estimated Creatinine Clearance: 13.8 mL/min (A) (by C-G formula based on SCr of 6.03 mg/dL (H)).  Assessment: [name] in the ICU after being "found down". His troponin is elevated and now trending down. Patient has now been found to have bilateral lower extremity with concern for possible PE.  Heparin level low earlier in the day, now within range at 0.51 units/mL after rate increase. No bleeding noted.   Goal of Therapy:  Heparin level 0.3-0.7 units/ml Monitor platelets by anticoagulation protocol: Yes   Plan:  Continue heparin at1350 units/hr Daily heparin level and CBC Follow for long term plans for anticoagulation  Brelan Hannen D. Rowe Warman, PharmD, BCPS Clinical Pharmacist 05/04/2017 9:58 PM

## 2017-05-04 NOTE — Progress Notes (Signed)
Wasted 150 ml of IV Fentanyl in sink with Vincent GrosMichael Yousef, RN.

## 2017-05-04 NOTE — Progress Notes (Addendum)
PULMONARY / CRITICAL CARE MEDICINE   Name: Joseph Downs MRN: [id number] DOB: 12-Oct-1950    ADMISSION DATE:  05/01/2017 CONSULTATION DATE:  05/01/17  REFERRING MD:  Dr. Deretha Emory  CHIEF COMPLAINT:  Found down  HISTORY OF PRESENT ILLNESS:        This is a 67 year old man who was found down.  It should be noted that it is been in the 40s for the past 24 hours.  He was tachypneic and hypotensive as well as hypothermic on arrival with a lactic acid in excess of 7.  He has received 3.5 L of crystalloid resuscitation along with vancomycin and Zosyn and he has been warmed with a bear hugger.  He is now alert and able to communicate with me.  He tells me that he had a syncopal episode last night but is a little hazy on the exact details.  He is not aware that he had any palpitations preceding that episode and he denies a prior history of seizure disorder.  He denies any new pains suggestive of an unprotected fall.  He denies use of street drugs or alcohol. Critical care service is asked to admit the patient because he still has a lactate acid in excess of 6 after being warmed and fluid resuscitated. Patient was initiated on bipap but would not tolerate mask. He was intubated shortly after admission.   SOCIAL HISTORY: He  reports that  has never smoked. he has never used smokeless tobacco. He reports that he does not drink alcohol or use drugs.  REVIEW OF SYSTEMS:   Unable to obtain  SUBJECTIVE:  Remains intubated, sedated CVVH started this morning, bicarb drip discontinued  VITAL SIGNS: BP 111/70   Pulse (!) 112   Temp 98 F (36.7 C) (Axillary)   Resp 17   Ht 5\' 10"  (1.778 m)   Wt 92.3 kg (203 lb 7.8 oz)   SpO2 97%   BMI 29.20 kg/m   HEMODYNAMICS: CVP:  [4 mmHg-14 mmHg] 4 mmHg  VENTILATOR SETTINGS: Vent Mode: PSV;CPAP FiO2 (%):  [30 %-40 %] 30 % Set Rate:  [18 bmp] 18 bmp Vt Set:  [580 mL] 580 mL PEEP:  [5 cmH20] 5 cmH20 Pressure Support:  [5 cmH20] 5 cmH20 Plateau  Pressure:  [10 cmH20-19 cmH20] 16 cmH20  INTAKE / OUTPUT: I/O last 3 completed shifts: In: 8211.2 [I.V.:5847.5; NG/GT:2263.7; IV Piggyback:100] Out: 625 [Urine:625]  PHYSICAL EXAMINATION: General: Ill-appearing man, lying in bed, sedated, intubated Neuro: Wakes to voice, moves all extremities, strong cough HEENT: ET tube in place, no oral lesions Cardiovascular: Regular, no murmur Lungs: Coarse bilateral breath sounds Abdomen: Soft, benign, positive bowel sounds Musculoskeletal: Bilateral lower extremity pitting edema Skin: He has very well demarcated bilateral foot skin debridement with good, pink well-appearing underlying tissue.  No evidence of cellulitis or purulent material  LABS:  BMET Recent Labs  Lab 05/03/17 1000 05/03/17 1915 05/04/17 0534  NA 149* 150* 147*  K 4.4 4.2 4.2  CL 113* 114* 109  CO2 19* 20* 21*  BUN 102* 100* 113*  CREATININE 6.66* 6.37* 7.51*  GLUCOSE 173* 135* 213*    Electrolytes Recent Labs  Lab 05/02/17 0237  05/02/17 1846 05/03/17 0328 05/03/17 1000 05/03/17 1915 05/04/17 0534  CALCIUM  --    < >  --  6.5* 6.2* 6.0* 6.5*  MG 1.9  --  2.5* 2.3  --  2.2  --   PHOS 6.2*  --   --   --   --  6.8* 6.9*  7.0*   < > = values in this interval not displayed.    CBC Recent Labs  Lab 05/02/17 1835 05/03/17 0328 05/04/17 0534  WBC 20.2* 20.8* 18.2*  HGB 10.4* 9.4* 8.4*  HCT 32.8* 29.5* 26.1*  PLT 137* 112* 113*    Coag's No results for input(s): APTT, INR in the last 168 hours.  Sepsis Markers Recent Labs  Lab 05/01/17 1954 05/02/17 0237 05/02/17 1049 05/02/17 1835 05/03/17 0328  LATICACIDVEN 5.9*  --  1.5 1.9 1.2  PROCALCITON 14.36 19.69  --   --  22.90    ABG Recent Labs  Lab 05/01/17 2119 05/02/17 0419 05/03/17 0326  PHART 7.436 7.260* 7.336*  PCO2ART 21.6* 47.2 38.0  PO2ART 55.0* 349.0* 114*    Liver Enzymes Recent Labs  Lab 05/02/17 1049 05/02/17 1835 05/03/17 0328 05/04/17 0534  AST 2,228* 1,471* 896*   --   ALT 957* 868* 716*  --   ALKPHOS 63 70 72  --   BILITOT 0.8 0.9 0.7  --   ALBUMIN 1.9* 1.9* 1.7* 1.7*    Cardiac Enzymes Recent Labs  Lab 05/02/17 1049 05/02/17 1835 05/03/17 0328  TROPONINI 5.65* 3.84* 2.29*    Glucose Recent Labs  Lab 05/03/17 1637 05/03/17 2113 05/03/17 2359 05/04/17 0344 05/04/17 0943 05/04/17 1222  GLUCAP 148* 191* 192* 205* 187* 161*    Imaging  STUDIES:  CXR 2/13 - no acute cardiopulmonary abnormality ECHO 2/14 - EF 65-70%, G1DD, RV strain  CULTURES: MRSA PCR negative Ucx + staph lugdenensis Blood cx - G+ rods, G- coccobacilli  ANTIBIOTICS: Vanc 2/13 > Zosyn 2/13>   SIGNIFICANT EVENTS: 2/13 admitted, intubated  LINES/TUBES: ETT 2/13 >>  PIV Foley 2/13 OGT 2/13 Right IJ HD catheter 2/15 >> Left IJ CVC 2/15 >>   DISCUSSION: 67 year old man found poorly responsive outside with evidence for shock and hypothermia.  He was intubated for encephalopathy and hemodynamic instability.  He had a lactic acidosis and acute renal failure, positive troponin.  Evaluation has revealed bilateral lower extremity DVT, right heart strain by echocardiogram, probable PE.  He has polymicrobial bacteremia, no evidence of valvular disease by echo, not yet speciated.  Also with Staph lugdunensis UTI (often associated with prostate cancer).  Progressive and uric renal failure, now CVVHD   ASSESSMENT / PLAN:  PULMONARY A: Acute respiratory failure Probable pulmonary embolism with associated right heart strain P:   Continue current ventilator support as we stabilize his hemodynamics and metabolic status Continue heparin infusion Continue scheduled bronchodilators  CARDIOVASCULAR A:  Stress related non-ST elevation MI, suspect right heart strain Cor pulmonale Cardiogenic shock P:  Appreciate cardiology input Continue dobutamine, up titrating Continue norepinephrine for pressor support. Follow CVP Continue heparin drip Volume removal per  CVVHD May ultimately require an ischemic evaluation  RENAL A:   Acute oliguric renal failure Hyperkalemia Lactic acidosis- resolved Rhabdomyolysis P:   CVVHD starting 2/16 Discontinue bicarb drip want CVVH running Follow CK Follow BMP, urine output  GASTROINTESTINAL A:   NPO Elevated LFTs, suspect shock liver P:   Continue tube feeding Continue PPI Follow LFTs for resolution  HEMATOLOGIC / ONC A:   Bilateral DVTs Leukocytosis Anemia Elevated PSA, Staph lugdunensis both suggestive of possible prostatic CA P:  Trend CBC Transfusion threshold < 7 Continue heparin He will need further evaluation for possible prostate cancer once he stabilizes from this critical illness.  INFECTIOUS A:   RLL infiltrate on CXR UTI-  >100,000 colonies staph lugdenenis, sensitivities pending P:  Continue vanc zosyn Await cultures, BCID pos 1/2 bottles gram - coccobacilli and gram + rods Ucx +, await sensitivities Trend WBC, fevers  GU A:  urinary retention P: PSA elevated Foley placed Monitor UOP  ENDOCRINE A:   hyperglycemia P:   a1c 6.4, TSH normal 1.3 Follow CBGs SSI on  WOUND CARE A: Bilateral feet with evidence for skin sloughing versus debridement.  The area looks well cared for and suspect that this was surgically debrided although do not have any current documentation P: Continue current dressing changes   NEUROLOGIC A:   Toxic metabolic encephalopathy Sedation for ventilator tolerance P:   RASS goal: -1 Continue fentanyl, Precedex Versed if needed Minimize sedation as we are able   FAMILY  - Updates: no family at bedside - Inter-disciplinary family meet or Palliative Care meeting due by:  2/20   Independent CC time 33 minutes  Levy Pupa, MD, PhD 05/04/2017, 12:58 PM [facility] [phone] or if no answer [phone]

## 2017-05-04 NOTE — Procedures (Signed)
Central Venous Catheter Insertion Procedure Note Joseph Downs [id number] 1951/02/12  Procedure: Insertion of Central Venous Catheter Indications: Assessment of intravascular volume, Drug and/or fluid administration and Frequent blood sampling  Procedure Details Consent: Unable to obtain consent because of emergent medical necessity. Time Out: Verified patient identification, verified procedure, site/side was marked, verified correct patient position, special equipment/implants available, medications/allergies/relevent history reviewed, required imaging and test results available.  Performed  Maximum sterile technique was used including antiseptics, cap, gloves, gown, hand hygiene, mask and sheet. Skin prep: Chlorhexidine; local anesthetic administered A antimicrobial bonded/coated triple lumen catheter was placed in the left internal jugular vein using the Seldinger technique.  Evaluation Blood flow good Complications: No apparent complications Patient did tolerate procedure well. Chest X-ray ordered to verify placement.  CXR: pending.  Joseph KussmaulKatalina Downs, AGACNP-BC [facility]  Pgr: [phone]  PCCM Pgr: [phone]

## 2017-05-05 ENCOUNTER — Inpatient Hospital Stay (HOSPITAL_COMMUNITY): Payer: Medicare Other

## 2017-05-05 DIAGNOSIS — I50811 Acute right heart failure: Secondary | ICD-10-CM

## 2017-05-05 DIAGNOSIS — I2602 Saddle embolus of pulmonary artery with acute cor pulmonale: Secondary | ICD-10-CM

## 2017-05-05 LAB — RENAL FUNCTION PANEL
ALBUMIN: 1.7 g/dL — AB (ref 3.5–5.0)
ANION GAP: 11 (ref 5–15)
Albumin: 1.8 g/dL — ABNORMAL LOW (ref 3.5–5.0)
Anion gap: 14 (ref 5–15)
BUN: 73 mg/dL — AB (ref 6–20)
BUN: 54 mg/dL — ABNORMAL HIGH (ref 6–20)
CALCIUM: 7 mg/dL — AB (ref 8.9–10.3)
CHLORIDE: 105 mmol/L (ref 101–111)
CO2: 22 mmol/L (ref 22–32)
CO2: 21 mmol/L — ABNORMAL LOW (ref 22–32)
CREATININE: 5.04 mg/dL — AB (ref 0.61–1.24)
Calcium: 7 mg/dL — ABNORMAL LOW (ref 8.9–10.3)
Chloride: 101 mmol/L (ref 101–111)
Creatinine, Ser: 3.64 mg/dL — ABNORMAL HIGH (ref 0.61–1.24)
GFR calc non Af Amer: 11 mL/min — ABNORMAL LOW (ref >60)
GFR, EST AFRICAN AMERICAN: 13 mL/min — AB (ref >60)
GFR, EST AFRICAN AMERICAN: 19 mL/min — AB (ref >60)
GFR, EST NON AFRICAN AMERICAN: 16 mL/min — AB (ref >60)
Glucose, Bld: 139 mg/dL — ABNORMAL HIGH (ref 65–99)
Glucose, Bld: 176 mg/dL — ABNORMAL HIGH (ref 65–99)
PHOSPHORUS: 4.3 mg/dL (ref 2.5–4.6)
POTASSIUM: 4.1 mmol/L (ref 3.5–5.1)
Phosphorus: 4.5 mg/dL (ref 2.5–4.6)
Potassium: 4 mmol/L (ref 3.5–5.1)
SODIUM: 133 mmol/L — AB (ref 135–145)
Sodium: 141 mmol/L (ref 135–145)

## 2017-05-05 LAB — CBC
HCT: 27.1 % — ABNORMAL LOW (ref 39.0–52.0)
Hemoglobin: 9 g/dL — ABNORMAL LOW (ref 13.0–17.0)
MCH: 29.3 pg (ref 26.0–34.0)
MCHC: 33.2 g/dL (ref 30.0–36.0)
MCV: 88.3 fL (ref 78.0–100.0)
Platelets: 117 10*3/uL — ABNORMAL LOW (ref 150–400)
RBC: 3.07 MIL/uL — ABNORMAL LOW (ref 4.22–5.81)
RDW: 14.2 % (ref 11.5–15.5)
WBC: 18.9 10*3/uL — ABNORMAL HIGH (ref 4.0–10.5)

## 2017-05-05 LAB — GLUCOSE, CAPILLARY
GLUCOSE-CAPILLARY: 140 mg/dL — AB (ref 65–99)
GLUCOSE-CAPILLARY: 152 mg/dL — AB (ref 65–99)
Glucose-Capillary: 104 mg/dL — ABNORMAL HIGH (ref 65–99)
Glucose-Capillary: 132 mg/dL — ABNORMAL HIGH (ref 65–99)
Glucose-Capillary: 144 mg/dL — ABNORMAL HIGH (ref 65–99)
Glucose-Capillary: 165 mg/dL — ABNORMAL HIGH (ref 65–99)

## 2017-05-05 LAB — COOXEMETRY PANEL
CARBOXYHEMOGLOBIN: 1.1 % (ref 0.5–1.5)
CARBOXYHEMOGLOBIN: 0.8 % (ref 0.5–1.5)
Carboxyhemoglobin: 1.2 % (ref 0.5–1.5)
METHEMOGLOBIN: 0.9 % (ref 0.0–1.5)
METHEMOGLOBIN: 0.7 % (ref 0.0–1.5)
Methemoglobin: 1.3 % (ref 0.0–1.5)
O2 Saturation: 61.8 %
O2 Saturation: 73.7 %
O2 Saturation: 48.2 %
TOTAL HEMOGLOBIN: 8.9 g/dL — AB (ref 12.0–16.0)
TOTAL HEMOGLOBIN: 7.9 g/dL — AB (ref 12.0–16.0)
TOTAL HEMOGLOBIN: 9.2 g/dL — AB (ref 12.0–16.0)

## 2017-05-05 LAB — HEPARIN LEVEL (UNFRACTIONATED): HEPARIN UNFRACTIONATED: 0.62 [IU]/mL (ref 0.30–0.70)

## 2017-05-05 LAB — MAGNESIUM: Magnesium: 2.3 mg/dL (ref 1.7–2.4)

## 2017-05-05 LAB — CK: CK TOTAL: 3198 U/L — AB (ref 49–397)

## 2017-05-05 MED ORDER — NOREPINEPHRINE BITARTRATE 1 MG/ML IV SOLN
0.0000 ug/min | INTRAVENOUS | Status: DC
  Administered 2017-05-05: 12 ug/min via INTRAVENOUS
  Filled 2017-05-05 (×3): qty 16

## 2017-05-05 NOTE — Progress Notes (Signed)
      INFECTIOUS DISEASE ATTENDING ADDENDUM:   Date: 05/05/2017  Patient name: Joseph Downs  Medical record number: [id number]  Date of birth: June 01, 1950   I received alert from Dr. Arrie Aranoladonato re this patient with Staph lugdunensis. I had thought this organism had been isolated int he blood but it has instead been isolated from urine with blood cultures with either no growth or other organisms.  If patient had Staph lugdunensis in the blood it would be imperative to investiigate for endocarditis. However it is not clearly  The case here. Please let us know if you would like us to see the patient as a formal consult. I was going to have Dr. Drue SecondSnider to this when I thought this organism was in the blood but since it is not I will defer this to the team.  Paulette Blanchornelius Van Dam 05/05/2017, 3:47 PM

## 2017-05-05 NOTE — Progress Notes (Signed)
S:No events overnight O:BP (!) 110/54   Pulse (!) 108   Temp 97.6 F (36.4 C) (Axillary)   Resp 15   Ht _0  (1.778 m)   Wt 95.1 kg (209 lb 10.5 oz)   SpO2 99%   BMI 30.08 kg/m   Intake/Output Summary (Last 24 hours) at 05/05/2017 1316 Last data filed at 05/05/2017 1300 Gross per 24 hour  Intake 4335.97 ml  Output 5831 ml  Net -1495.03 ml   Intake/Output: I/O last 3 completed shifts: In: 7348.6 [I.V.:4198.6; NG/GT:2800; IV Piggyback:350] Out: 2637 [Urine:3410; Other:2650; Stool:160]  Intake/Output this shift:  Total I/O In: 951 [I.V.:451; NG/GT:450; IV Piggyback:50] Out: 1565 [Urine:950; Other:315; Stool:300] Weight change: 2.8 kg (6 lb 2.8 oz) CHY:IFOYDXAJO/INOMVEH CVS: tachy Resp: cta Abd: benign Ext: 2-3+ edema lower extremities  Recent Labs  Lab 05/01/17 1110  05/02/17 0237 05/02/17 1049 05/02/17 1835 05/03/17 0328 05/03/17 1000 05/03/17 1915 05/04/17 0534 05/04/17 1447 05/05/17 0416  NA 145  --   --  144 148* 148* 149* 150* 147* 145 141  K 4.1  --   --  6.5* 5.8* 4.9 4.4 4.2 4.2 5.6* 4.1  CL 110  --   --  115* 116* 114* 113* 114* 109 108 105  CO2 14*  --   --  14* 15* 18* 19* 20* 21* 24 22  GLUCOSE 218*  --   --  121* 132* 164* 173* 135* 213* 184* 139*  BUN 58*  --   --  82* 89* 96* 102* 100* 113* 97* 73*  CREATININE 2.50*   < >  --  4.97* 5.76* 6.33* 6.66* 6.37* 7.51* 6.03* 5.04*  ALBUMIN 2.3*  --   --  1.9* 1.9* 1.7*  --   --  1.7* 1.6* 1.8*  CALCIUM 8.7*  --   --  7.4* 7.1* 6.5* 6.2* 6.0* 6.5* 6.5* 7.0*  PHOS  --   --  6.2*  --   --   --   --  6.8* 6.9*  7.0* 5.6*  5.6* 4.5  AST 81*  --   --  2,228* 1,471* 896*  --   --   --   --   --   ALT 83*  --   --  957* 868* 716*  --   --   --   --   --    < > = values in this interval not displayed.   Liver Function Tests: Recent Labs  Lab 05/02/17 1049 05/02/17 1835 05/03/17 0328 05/04/17 0534 05/04/17 1447 05/05/17 0416  AST 2,228* 1,471* 896*  --   --   --   ALT 957* 868* 716*  --   --   --    ALKPHOS 63 70 72  --   --   --   BILITOT 0.8 0.9 0.7  --   --   --   PROT 6.0* 5.8* 5.5*  --   --   --   ALBUMIN 1.9* 1.9* 1.7* 1.7* 1.6* 1.8*   No results for input(s): LIPASE, AMYLASE in the last 168 hours. No results for input(s): AMMONIA in the last 168 hours. CBC: Recent Labs  Lab 05/01/17 1110  05/02/17 0237 05/02/17 1835 05/03/17 0328 05/04/17 0534 05/05/17 0416  WBC 15.6*   < > 13.6* 20.2* 20.8* 18.2* 18.9*  NEUTROABS 13.0*  --   --   --   --   --   --   HGB 13.1   < > 9.6* 10.4* 9.4* 8.4*  9.0*  HCT 41.3   < > 30.3* 32.8* 29.5* 26.1* 27.1*  MCV 92.0   < > 89.6 91.4 90.2 88.5 88.3  PLT 181   < > 88* 137* 112* 113* 117*   < > = values in this interval not displayed.   Cardiac Enzymes: Recent Labs  Lab 05/01/17 1954 05/02/17 0237 05/02/17 1049 05/02/17 1835 05/03/17 0328 05/04/17 0534 05/05/17 0416  CKTOTAL 2,933*  --   --   --  4,620* 3,649* 3,198*  TROPONINI 2.83* 5.30* 5.65* 3.84* 2.29*  --   --    CBG: Recent Labs  Lab 05/04/17 2024 05/05/17 0032 05/05/17 0340 05/05/17 0800 05/05/17 1250  GLUCAP 138* 165* 140* 132* 144*    Iron Studies: No results for input(s): IRON, TIBC, TRANSFERRIN, FERRITIN in the last 72 hours. Studies/Results: Dg Chest Port 1 View  Result Date: 05/05/2017 CLINICAL DATA:  Acute respiratory failure. EXAM: PORTABLE CHEST 1 VIEW COMPARISON:  05/04/2017 FINDINGS: 0632 hours. Rotated film. Endotracheal tube not well seen but may still be in place. The NG tube passes into the stomach although the distal tip position is not included on the film. Right IJ central line tip overlies the region of the innominate vein confluence. Left IJ central line tip is also positioned over the innominate vein confluence. Bibasilar atelectasis. Heart size within normal limits. Telemetry leads overlie the chest. IMPRESSION: Stable exam with bibasilar atelectasis. Electronically Signed   By: Misty Stanley M.D.   On: 05/05/2017 09:05   Dg Chest Port 1  View  Result Date: 05/04/2017 CLINICAL DATA:  Central line placement. EXAM: PORTABLE CHEST 1 VIEW COMPARISON:  Chest radiograph May 03, 2016 FINDINGS: Endotracheal tube tip projects 3.6 cm above the carina. Large bore catheter RIGHT internal jugular vein with distal tip projecting in proximal superior vena cava. LEFT internal jugular central venous catheter distal tip projects in proximal superior vena cava. Nasogastric tube past proximal stomach, distal tip out of field-of-view. Cardiac silhouette is mildly enlarged, mediastinal silhouette is non suspicious. Bibasilar strandy densities. No pleural effusion or focal consolidation. No pneumothorax. Soft tissue planes and included osseous structures are unchanged. IMPRESSION: New LEFT internal jugular central venous catheter distal tip projects in proximal superior vena cava. No pneumothorax. No apparent change remaining life support lines. Mild cardiomegaly.  Bibasilar atelectasis/scarring. Electronically Signed   By: Elon Alas M.D.   On: 05/04/2017 06:17   Dg Chest Port 1 View  Result Date: 05/03/2017 CLINICAL DATA:  Status post central line placement today. EXAM: PORTABLE CHEST 1 VIEW COMPARISON:  Single-view of the chest 05/01/2017. FINDINGS: The patient has a new right IJ approach central venous catheter with tip in the mid superior vena cava. ETT and NG tube are unchanged. Bibasilar atelectasis appears slightly worse on the left than on the prior study. No pleural effusion. Heart size is normal. IMPRESSION: Right IJ catheter tip projects in the mid superior vena cava. Negative for pneumothorax. Bibasilar atelectasis. Electronically Signed   By: Inge Rise M.D.   On: 05/03/2017 15:39   . aspirin  81 mg Per Tube Daily  . chlorhexidine gluconate (MEDLINE KIT)  15 mL Mouth Rinse BID  . free water  100 mL Per Tube Q6H  . furosemide  80 mg Intravenous Q12H  . insulin aspart  2-6 Units Subcutaneous Q4H  . ipratropium-albuterol  3 mL  Nebulization Q6H  . mouth rinse  15 mL Mouth Rinse QID  . pantoprazole (PROTONIX) IV  40 mg Intravenous QHS  . silver  sulfADIAZINE   Topical BID  . thiamine injection  100 mg Intravenous Daily    BMET    Component Value Date/Time   NA 141 05/05/2017 0416   K 4.1 05/05/2017 0416   CL 105 05/05/2017 0416   CO2 22 05/05/2017 0416   GLUCOSE 139 (H) 05/05/2017 0416   BUN 73 (H) 05/05/2017 0416   CREATININE 5.04 (H) 05/05/2017 0416   CALCIUM 7.0 (L) 05/05/2017 0416   GFRNONAA 11 (L) 05/05/2017 0416   GFRAA 13 (L) 05/05/2017 0416   CBC    Component Value Date/Time   WBC 18.9 (H) 05/05/2017 0416   RBC 3.07 (L) 05/05/2017 0416   HGB 9.0 (L) 05/05/2017 0416   HCT 27.1 (L) 05/05/2017 0416   PLT 117 (L) 05/05/2017 0416   MCV 88.3 05/05/2017 0416   MCH 29.3 05/05/2017 0416   MCHC 33.2 05/05/2017 0416   RDW 14.2 05/05/2017 0416   LYMPHSABS 1.7 05/01/2017 1110   MONOABS 0.9 05/01/2017 1110   EOSABS 0.0 05/01/2017 1110   BASOSABS 0.0 05/01/2017 1110    Assessment/Plan:  1. AKI- in setting of shock (hemodynamically significant PE with RV failure), hypovolemia, and mild rhabdomyolyisis (he also has history of urinary retention and a previous admission with AKI treated with Foley catheter).  1. Remains oliguric with worsening azotemia and started on CVVHD 05/04/17 2. Continue with CVVHDF for now and follow UOP. 3.  continue with lasix and hopefully can see how he does off of cvvhd in a few days 2. Obstructive uropathy- possibly related to prostate cancer given presence of staph lugdunensis and markedly elevated PSA. Foley in place  3. Bilateral DVT and presumed PE with RV failure- on heparin 4. RV failure- on dobutamine per Cardiology with marked increase in UOP. 5. Anemia of critical illness 6. VDRF- per PCCM 7. Shock- possibly related to PE and RV failure but also with staph UTI and possible sepsis. On vanc/zosyn per PCCM 8. Hyperkalemia- resolved 9. Metabolic acidosis due to #1  improving with bicarb (will stop once CVVHD initiated)   Donetta Potts, MD [facility] [phone]

## 2017-05-05 NOTE — Consult Note (Signed)
Advanced Heart Failure Team Consult Note   Primary Physician: Patient, No Pcp Per PCP-Cardiologist:  Skeet Latch, MD  Reason for Consultation: RV failure  HPI:    AHF team consulted by Dr. Oval Linsey to assist with management of cardiogenic shock  Joseph Downs is a 67 y/o homeless male with known past medical history admitted on 2/13 after being found down and hypothermic. On arrival to ER was able to communicate despite lactate > 7. Reported recent syncopal episode.   He was initially hypotensive and tachycardic and had a leukocytosis. CXR concerning for CAP. Initial Troponin was elevated to 2.8. Peaked at 5.6  ECG non-acute.He was improving and then became responsive and subsequently developed hypoxic respiratory failure requiring intubation. He was found to have extensive lower extremity DVTs and presumptively PE (no CT due to AKI).  Echo LVEF 65-70% with RV failure and McConnell's sign. Hospitalization complicated by rhabdo, AKI and oliguria with SCr up to 7.  Urine culture positive for Staph lugdeunensis.BCx + ACINETOBACTER LWOFFII.   Now intubated and sedated. On CVVHD. On dobutamine 7.5 and weaning NE (now down to 2). SBP ~110. Urine output now picking up. Feet wrapped due to frostbite with skin sloughing   Review of Systems: Unavailable as he is intubated   Home Medications Prior to Admission medications   Medication Sig Start Date End Date Taking? Authorizing Provider  finasteride (PROSCAR) 5 MG tablet Take 5 mg by mouth daily.   Yes [provider]  tamsulosin (FLOMAX) 0.4 MG CAPS capsule Take 1 capsule (0.4 mg total) by mouth daily after supper. Patient not taking: Reported on 05/01/2017 03/26/17   Barton Dubois, MD   Inpatient meds:  . aspirin  81 mg Per Tube Daily  . chlorhexidine gluconate (MEDLINE KIT)  15 mL Mouth Rinse BID  . free water  100 mL Per Tube Q6H  . furosemide  80 mg Intravenous Q12H  . insulin aspart  2-6 Units Subcutaneous Q4H  .  ipratropium-albuterol  3 mL Nebulization Q6H  . mouth rinse  15 mL Mouth Rinse QID  . pantoprazole (PROTONIX) IV  40 mg Intravenous QHS  . silver sulfADIAZINE   Topical BID  . thiamine injection  100 mg Intravenous Daily    Past Medical History:  Not available. Nothing on file  Family History: Family History  Problem Relation Age of Onset  . Cancer Father     Social History: Social History   Socioeconomic History  . Marital status: Married    Spouse name: Not on file  . Number of children: Not on file  . Years of education: Not on file  . Highest education level: Not on file  Social Needs  . Financial resource strain: Not on file  . Food insecurity - worry: Not on file  . Food insecurity - inability: Not on file  . Transportation needs - medical: Not on file  . Transportation needs - non-medical: Not on file  Occupational History  . Not on file  Tobacco Use  . Smoking status: Never Smoker  . Smokeless tobacco: Never Used  Substance and Sexual Activity  . Alcohol use: No  . Drug use: No  . Sexual activity: Not on file  Other Topics Concern  . Not on file  Social History Narrative  . Not on file    Allergies:  No Known Allergies  Objective:    Vital Signs:   Temp:  [96.8 F (36 C)-99.3 F (37.4 C)] 97.6 F (36.4 C) (02/17  0913) Pulse Rate:  [97-134] 97 (02/17 1430) Resp:  [15-44] 16 (02/17 1430) BP: (100-135)/(52-113) 110/54 (02/17 1126) SpO2:  [88 %-100 %] 100 % (02/17 1430) Arterial Line BP: (96-166)/(39-70) 114/52 (02/17 1430) FiO2 (%):  [30 %] 30 % (02/17 1414) Weight:  [95.1 kg (209 lb 10.5 oz)] 95.1 kg (209 lb 10.5 oz) (02/17 0432) Last BM Date: 05/04/17  Weight change: Filed Weights   05/03/17 0500 05/04/17 0500 05/05/17 0432  Weight: 89.3 kg (196 lb 13.9 oz) 92.3 kg (203 lb 7.8 oz) 95.1 kg (209 lb 10.5 oz)    Intake/Output:   Intake/Output Summary (Last 24 hours) at 05/05/2017 1452 Last data filed at 05/05/2017 1400 Gross per 24 hour    Intake 4392.45 ml  Output 5610 ml  Net -1217.55 ml      Physical Exam    CVP 14  General:  Sedated on vent. Chronically ill appearing HEENT: edentulous + ETT temporal wasting  Neck: supple. JVP to ear.  RIJ Trialysis Carotids 2+ bilat; no bruits. No lymphadenopathy or thyromegaly appreciated. Cor: + RV lift. Tachy regular Lungs: mechanical BS Abdomen: soft, nontender, + distended. No hepatosplenomegaly. No bruits or masses. Good bowel sounds. Extremities: no cyanosis, clubbing, rash, 3+ edema LEs wrapped due to frostbite  + foley with clear urine  Neuro: intubated sedated    Telemetry   Sinus tach 100-110 Personally reviewed  EKG    2/14: NSR 80  No ST-T wave abnormalities. Personally reviewed   Labs   Basic Metabolic Panel: Recent Labs  Lab 05/02/17 0237  05/02/17 1846 05/03/17 0328 05/03/17 1000 05/03/17 1915 05/04/17 0534 05/04/17 1447 05/05/17 0416  NA  --    < >  --  148* 149* 150* 147* 145 141  K  --    < >  --  4.9 4.4 4.2 4.2 5.6* 4.1  CL  --    < >  --  114* 113* 114* 109 108 105  CO2  --    < >  --  18* 19* 20* 21* 24 22  GLUCOSE  --    < >  --  164* 173* 135* 213* 184* 139*  BUN  --    < >  --  96* 102* 100* 113* 97* 73*  CREATININE  --    < >  --  6.33* 6.66* 6.37* 7.51* 6.03* 5.04*  CALCIUM  --    < >  --  6.5* 6.2* 6.0* 6.5* 6.5* 7.0*  MG 1.9  --  2.5* 2.3  --  2.2  --   --  2.3  PHOS 6.2*  --   --   --   --  6.8* 6.9*  7.0* 5.6*  5.6* 4.5   < > = values in this interval not displayed.    Liver Function Tests: Recent Labs  Lab 05/01/17 1110 05/02/17 1049 05/02/17 1835 05/03/17 0328 05/04/17 0534 05/04/17 1447 05/05/17 0416  AST 81* 2,228* 1,471* 896*  --   --   --   ALT 83* 957* 868* 716*  --   --   --   ALKPHOS 75 63 70 72  --   --   --   BILITOT 0.9 0.8 0.9 0.7  --   --   --   PROT 7.5 6.0* 5.8* 5.5*  --   --   --   ALBUMIN 2.3* 1.9* 1.9* 1.7* 1.7* 1.6* 1.8*   No results for input(s): LIPASE, AMYLASE in the last 168 hours. No  results for input(s): AMMONIA in the last 168 hours.  CBC: Recent Labs  Lab 05/01/17 1110  05/02/17 0237 05/02/17 1835 05/03/17 0328 05/04/17 0534 05/05/17 0416  WBC 15.6*   < > 13.6* 20.2* 20.8* 18.2* 18.9*  NEUTROABS 13.0*  --   --   --   --   --   --   HGB 13.1   < > 9.6* 10.4* 9.4* 8.4* 9.0*  HCT 41.3   < > 30.3* 32.8* 29.5* 26.1* 27.1*  MCV 92.0   < > 89.6 91.4 90.2 88.5 88.3  PLT 181   < > 88* 137* 112* 113* 117*   < > = values in this interval not displayed.    Cardiac Enzymes: Recent Labs  Lab 05/01/17 1954 05/02/17 0237 05/02/17 1049 05/02/17 1835 05/03/17 0328 05/04/17 0534 05/05/17 0416  CKTOTAL 2,933*  --   --   --  4,620* 3,649* 3,198*  TROPONINI 2.83* 5.30* 5.65* 3.84* 2.29*  --   --     BNP: BNP (last 3 results) Recent Labs    05/01/17 1954  BNP 208.4*    ProBNP (last 3 results) No results for input(s): PROBNP in the last 8760 hours.   CBG: Recent Labs  Lab 05/04/17 2024 05/05/17 0032 05/05/17 0340 05/05/17 0800 05/05/17 1250  GLUCAP 138* 165* 140* 132* 144*    Coagulation Studies: No results for input(s): LABPROT, INR in the last 72 hours.   Imaging   Dg Chest Port 1 View  Result Date: 05/05/2017 CLINICAL DATA:  Acute respiratory failure. EXAM: PORTABLE CHEST 1 VIEW COMPARISON:  05/04/2017 FINDINGS: 0632 hours. Rotated film. Endotracheal tube not well seen but may still be in place. The NG tube passes into the stomach although the distal tip position is not included on the film. Right IJ central line tip overlies the region of the innominate vein confluence. Left IJ central line tip is also positioned over the innominate vein confluence. Bibasilar atelectasis. Heart size within normal limits. Telemetry leads overlie the chest. IMPRESSION: Stable exam with bibasilar atelectasis. Electronically Signed   By: Misty Stanley M.D.   On: 05/05/2017 09:05      Medications:     Current Medications: . aspirin  81 mg Per Tube Daily  .  chlorhexidine gluconate (MEDLINE KIT)  15 mL Mouth Rinse BID  . free water  100 mL Per Tube Q6H  . furosemide  80 mg Intravenous Q12H  . insulin aspart  2-6 Units Subcutaneous Q4H  . ipratropium-albuterol  3 mL Nebulization Q6H  . mouth rinse  15 mL Mouth Rinse QID  . pantoprazole (PROTONIX) IV  40 mg Intravenous QHS  . silver sulfADIAZINE   Topical BID  . thiamine injection  100 mg Intravenous Daily     Infusions: . sodium chloride    . dexmedetomidine (PRECEDEX) IV infusion 1.2 mcg/kg/hr (05/05/17 1438)  . DOBUTamine 7.5 mcg/kg/min (05/05/17 1434)  . feeding supplement (VITAL AF 1.2 CAL) 1,000 mL (05/05/17 1400)  . fentaNYL infusion INTRAVENOUS 150 mcg/hr (05/05/17 1400)  . heparin 1,350 Units/hr (05/05/17 1400)  . heparin    . norepinephrine (LEVOPHED) Adult infusion 2 mcg/min (05/05/17 1439)  . piperacillin-tazobactam (ZOSYN)  IV Stopped (05/05/17 1347)  . dialysis replacement fluid (prismasate) 500 mL/hr at 05/05/17 0731  . dialysis replacement fluid (prismasate) 300 mL/hr at 05/05/17 0401  . dialysate (PRISMASATE) 1,000 mL/hr at 05/05/17 1226  . vancomycin Stopped (05/04/17 2220)       Patient Profile    71M with  homelessness and no known past medical history here after being found down and hypothermic.   He was initially hypotensive and tachycardic and had a leukocytosis. CXR concerning for CAP, urine culture positive for  Staph lugdeunensis.Troponin was elevated to 2.8. He was improving and then became responsive and subsequently developed hypoxic respiratory failure requiring intubation. He was found to have extensive lower extremity DVTs and presumptively PE (no CT due to AKI).  Echo positive for RV failure and McConnell's sign. Hospitalization complicated by AKI and oliguria.   Assessment/Plan   1. Acute RV failure/cor pulmonale - echo LVEF 65-70% with severe RV HK - likely massive PE (has extensive DVT on LE u/s). No CT with AKI - continue heparin. -  currently on dobutamine and NE for support. Co-ox 74% CVP 14 - situation complicated by possible sepsis - given mixed picture would actually favor NE for support but seems to be tolerating dobutamine well so will continue. IF BP gets soft would focus on using NE - has marked volume overload. But with PE is very preload sensitive. Continue to pull/diurese slowly - may need to consider IVC filter given burden of LE clot - long-term prognosis is guarded  2. Shock - likely mixed cardiogenic (RV) and septic - co-ox 74% - as above given mixed picture would actually favor NE for support but seems to be tolerating dobutamine well so will continue. IF BP gets soft would focus on using NE  4. Elevated troponin - due to PE and RV strain. ECG ok no evidence of regional LV wall motionaabnormality to suggest ACS  5. Acute respiratory failure - due to #1. Also PNA on CXR - vent management per CCM  6. AKI - due to shock and rhabdo on CVVHD. Renal function seems to be recovering - Renal following  7. Sepsis - Staph lugdunensis in urine (with elevated PSA (42) suggestive of possible prostate CA) - ACINETOBACTER LWOFFII in blood  - on abx per CCM  8. Frostbite with LE skin loss - WOC following  9. Shock liver - improving with support   10. Homelessness   CRITICAL CARE Performed by: Glori Bickers  Total critical care time: 45 minutes  Critical care time was exclusive of separately billable procedures and treating other patients.  Critical care was necessary to treat or prevent imminent or life-threatening deterioration.  Critical care was time spent personally by me (independent of midlevel providers or residents) on the following activities: development of treatment plan with patient and/or surrogate as well as nursing, discussions with consultants, evaluation of patient's response to treatment, examination of patient, obtaining history from patient or surrogate, ordering and  performing treatments and interventions, ordering and review of laboratory studies, ordering and review of radiographic studies, pulse oximetry and re-evaluation of patient's condition.   Length of Stay: [place], MD  05/05/2017, 2:52 PM  Advanced Heart Failure Team Pager [phone] (M-F; 7a - 4p)  Please contact [facility] for night-coverage after hours (4p -7a ) and weekends on [email or web address]

## 2017-05-05 NOTE — Progress Notes (Signed)
PULMONARY / CRITICAL CARE MEDICINE   Name: Joseph Downs MRN: [id number] DOB: Jun 22, 1950    ADMISSION DATE:  05/01/2017 CONSULTATION DATE:  05/01/17  REFERRING MD:  Dr. Deretha Emory  CHIEF COMPLAINT:  Found down  HISTORY OF PRESENT ILLNESS:        This is a 67 year old man who was found down.  It should be noted that it is been in the 40s for the past 24 hours.  He was tachypneic and hypotensive as well as hypothermic on arrival with a lactic acid in excess of 7.  He has received 3.5 L of crystalloid resuscitation along with vancomycin and Zosyn and he has been warmed with a bear hugger.  He is now alert and able to communicate with me.  He tells me that he had a syncopal episode last night but is a little hazy on the exact details.  He is not aware that he had any palpitations preceding that episode and he denies a prior history of seizure disorder.  He denies any new pains suggestive of an unprotected fall.  He denies use of street drugs or alcohol. Critical care service is asked to admit the patient because he still has a lactate acid in excess of 6 after being warmed and fluid resuscitated. Patient was initiated on bipap but would not tolerate mask. He was intubated shortly after admission.   SUBJECTIVE:  Continues to tolerate CVVHD, -2 L / 24 hours Agitated on wakeup assessment this morning Norepinephrine weaned to off, dobutamine increased to 15 over the last 24 hours  VITAL SIGNS: BP 129/69   Pulse (!) 115   Temp 97.6 F (36.4 C) (Axillary)   Resp 15   Ht 5\' 10"  (1.778 m)   Wt 95.1 kg (209 lb 10.5 oz)   SpO2 98%   BMI 30.08 kg/m   HEMODYNAMICS: CVP:  [4 mmHg-14 mmHg] 14 mmHg  VENTILATOR SETTINGS: Vent Mode: PRVC FiO2 (%):  [30 %] 30 % Set Rate:  [18 bmp] 18 bmp Vt Set:  [580 mL] 580 mL PEEP:  [5 cmH20] 5 cmH20 Pressure Support:  [5 cmH20-8 cmH20] 8 cmH20 Plateau Pressure:  [15 cmH20-24 cmH20] 24 cmH20  INTAKE / OUTPUT: I/O last 3 completed shifts: In: 7348.6  [I.V.:4198.6; NG/GT:2800; IV Piggyback:350] Out: 6220 [Urine:3410; OZDGU:4403; Stool:160]  PHYSICAL EXAMINATION: General: Ill-appearing man, lying in bed, sedated, intubated Neuro: Wakes to voice, moves all extremities, strong cough HEENT: ET tube in place, no oral lesions Cardiovascular: Regular, no murmur Lungs: Coarse bilateral breath sounds Abdomen: Soft, benign, positive bowel sounds Musculoskeletal: Bilateral lower extremity pitting edema Skin: He has very well demarcated bilateral foot skin debridement with good, pink well-appearing underlying tissue.  No evidence of cellulitis or purulent material  LABS:  BMET Recent Labs  Lab 05/04/17 0534 05/04/17 1447 05/05/17 0416  NA 147* 145 141  K 4.2 5.6* 4.1  CL 109 108 105  CO2 21* 24 22  BUN 113* 97* 73*  CREATININE 7.51* 6.03* 5.04*  GLUCOSE 213* 184* 139*    Electrolytes Recent Labs  Lab 05/03/17 0328  05/03/17 1915 05/04/17 0534 05/04/17 1447 05/05/17 0416  CALCIUM 6.5*   < > 6.0* 6.5* 6.5* 7.0*  MG 2.3  --  2.2  --   --  2.3  PHOS  --   --  6.8* 6.9*  7.0* 5.6*  5.6* 4.5   < > = values in this interval not displayed.    CBC Recent Labs  Lab 05/03/17 0328 05/04/17 0534 05/05/17  0416  WBC 20.8* 18.2* 18.9*  HGB 9.4* 8.4* 9.0*  HCT 29.5* 26.1* 27.1*  PLT 112* 113* 117*    Coag's No results for input(s): APTT, INR in the last 168 hours.  Sepsis Markers Recent Labs  Lab 05/01/17 1954 05/02/17 0237 05/02/17 1049 05/02/17 1835 05/03/17 0328  LATICACIDVEN 5.9*  --  1.5 1.9 1.2  PROCALCITON 14.36 19.69  --   --  22.90    ABG Recent Labs  Lab 05/01/17 2119 05/02/17 0419 05/03/17 0326  PHART 7.436 7.260* 7.336*  PCO2ART 21.6* 47.2 38.0  PO2ART 55.0* 349.0* 114*    Liver Enzymes Recent Labs  Lab 05/02/17 1049 05/02/17 1835 05/03/17 0328 05/04/17 0534 05/04/17 1447 05/05/17 0416  AST 2,228* 1,471* 896*  --   --   --   ALT 957* 868* 716*  --   --   --   ALKPHOS 63 70 72  --   --    --   BILITOT 0.8 0.9 0.7  --   --   --   ALBUMIN 1.9* 1.9* 1.7* 1.7* 1.6* 1.8*    Cardiac Enzymes Recent Labs  Lab 05/02/17 1049 05/02/17 1835 05/03/17 0328  TROPONINI 5.65* 3.84* 2.29*    Glucose Recent Labs  Lab 05/04/17 1222 05/04/17 1610 05/04/17 2024 05/05/17 0032 05/05/17 0340 05/05/17 0800  GLUCAP 161* 179* 138* 165* 140* 132*    Imaging  STUDIES:  CXR 2/13 - no acute cardiopulmonary abnormality ECHO 2/14 - EF 65-70%, G1DD, RV strain  CULTURES: MRSA PCR negative Ucx + staph lugdenensis Blood cx 2/13 >> GPR + Acinetobacter lwoffii  ANTIBIOTICS: Vanc 2/13 > Zosyn 2/13>   SIGNIFICANT EVENTS: 2/13 admitted, intubated  LINES/TUBES: ETT 2/13 >>  PIV Foley 2/13 OGT 2/13 Right IJ HD catheter 2/15 >> Left IJ CVC 2/15 >>   DISCUSSION: 67 year old man found poorly responsive outside with evidence for shock and hypothermia.  He was intubated for encephalopathy and hemodynamic instability.  He had a lactic acidosis and acute renal failure, positive troponin.  Evaluation has revealed bilateral lower extremity DVT, right heart strain by echocardiogram, probable PE.  He has polymicrobial bacteremia, no evidence of valvular disease by echo, not yet speciated.  Also with Staph lugdunensis UTI (often associated with prostate cancer).  Progressive oliguric renal failure, now CVVHD   ASSESSMENT / PLAN:  PULMONARY A: Acute respiratory failure Probable pulmonary embolism with associated right heart strain P:   Continue current ventilator support as we stabilize hemodynamics, metabolic status, renal function. Continue heparin infusion Continue scheduled bronchodilators  CARDIOVASCULAR A:  Stress related non-ST elevation MI, suspect right heart strain Cor pulmonale Cardiogenic shock P:  Appreciate cardiology input Currently on dobutamine 15, plan to titrate down as he can tolerate, no lower than 5-7.5; current co-oximetry 74% Titrate norepinephrine to off if  able For follow CVP Continue heparin drip Volume removal per CVVHD He will likely ultimately require an ischemic evaluation  RENAL A:   Acute oliguric renal failure Hyperkalemia Lactic acidosis- resolved Rhabdomyolysis-improving P:   Continue CVVHD and volume removal Discontinue bicarb drip want CVVH running Follow BMP, urine output  GASTROINTESTINAL A:   NPO Elevated LFTs, suspect shock liver P:   Continue PPI Continue tube feeding Follow LFT  HEMATOLOGIC / ONC A:   Bilateral DVTs Leukocytosis Anemia Elevated PSA, Staph lugdunensis both suggestive of possible prostatic CA P:  Follow CBC on heparin Transfusion threshold < 7 Continue heparin drip, notes some blood-tinged secretions from endotracheal tube.  No indication to stop  at this time but we will need to follow. He will need further evaluation for possible prostate cancer once he stabilizes from his critical illness  INFECTIOUS A:   RLL infiltrate on CXR UTI-  >100,000 colonies staph lugdenenis, sensitivities pending Acinetobacter lwoffii bacteremia P:   Continue vancomycin and Zosyn pending sensitivities Wound care for his bilateral foot wounds Trend WBC, fevers  GU A:  urinary retention P: PSA elevated Foley placed He will need an evaluation for prostate cancer once stabilized  ENDOCRINE A:   hyperglycemia P:   a1c 6.4, TSH normal 1.3 Follow CBG Sliding scale insulin per protocol  WOUND CARE A: Bilateral feet with evidence for skin sloughing versus debridement.  The area looks well cared for and suspect that this was surgically debrided although do not have any current documentation P: Continue current dressing changes   NEUROLOGIC A:   Toxic metabolic encephalopathy Sedation for ventilator tolerance P:   RASS goal: -1 Continue fentanyl, Precedex Versed if needed Wean sedation as we are able   FAMILY  - Updates: no family at bedside - Inter-disciplinary family meet or  Palliative Care meeting due by:  2/20   Independent CC time 34 minutes  Levy Pupaobert Byrum, MD, PhD 05/05/2017, 11:20 AM [facility] [phone] or if no answer [phone]

## 2017-05-05 NOTE — Progress Notes (Addendum)
Progress Note  Patient Name: Joseph Downs Date of Encounter: 05/05/2017  Primary Cardiologist: Skeet Latch, MD   Subjective   Unable to obtain.  Patient intubated and sedated.  Inpatient Medications    Scheduled Meds: . aspirin  81 mg Per Tube Daily  . chlorhexidine gluconate (MEDLINE KIT)  15 mL Mouth Rinse BID  . free water  100 mL Per Tube Q6H  . furosemide  80 mg Intravenous Q12H  . insulin aspart  2-6 Units Subcutaneous Q4H  . ipratropium-albuterol  3 mL Nebulization Q6H  . mouth rinse  15 mL Mouth Rinse QID  . pantoprazole (PROTONIX) IV  40 mg Intravenous QHS  . silver sulfADIAZINE   Topical BID  . thiamine injection  100 mg Intravenous Daily   Continuous Infusions: . sodium chloride    . dexmedetomidine (PRECEDEX) IV infusion 0.999 mcg/kg/hr (05/05/17 0800)  . DOBUTamine 15.006 mcg/kg/min (05/05/17 0800)  . feeding supplement (VITAL AF 1.2 CAL) 1,000 mL (05/05/17 0800)  . fentaNYL infusion INTRAVENOUS 150 mcg/hr (05/05/17 0800)  . heparin 1,350 Units/hr (05/05/17 0800)  . heparin    . norepinephrine (LEVOPHED) Adult infusion 8 mcg/min (05/05/17 0800)  . piperacillin-tazobactam (ZOSYN)  IV Stopped (05/05/17 0800)  . dialysis replacement fluid (prismasate) 500 mL/hr at 05/05/17 0731  . dialysis replacement fluid (prismasate) 300 mL/hr at 05/05/17 0401  . dialysate (PRISMASATE) 1,000 mL/hr at 05/05/17 6546  . vancomycin Stopped (05/04/17 2220)   PRN Meds: Place/Maintain arterial line **AND** sodium chloride, docusate, fentaNYL, heparin, heparin, midazolam   Vital Signs    Vitals:   05/05/17 0530 05/05/17 0545 05/05/17 0600 05/05/17 0615  BP:      Pulse: (!) 121 (!) 116 (!) 122 (!) 119  Resp: (!) 21 20 17 16   Temp:      TempSrc:      SpO2: 96% 98% 98% 98%  Weight:      Height:        Intake/Output Summary (Last 24 hours) at 05/05/2017 0816 Last data filed at 05/05/2017 0800 Gross per 24 hour  Intake 4649.31 ml  Output 6178 ml  Net -1528.69 ml     Filed Weights   05/03/17 0500 05/04/17 0500 05/05/17 0432  Weight: 196 lb 13.9 oz (89.3 kg) 203 lb 7.8 oz (92.3 kg) 209 lb 10.5 oz (95.1 kg)   CVP 14  Telemetry    Sinus rhythm, sinus tachycardia.  No events.- Personally Reviewed  ECG sinus tachycardia.  Rate 102 bpm.  Nonspecific    Sinus tachycardia.  Rate 102 bpm.  Nonspecific IVCD PVC.  Nonspecific ST changes..  - Personally Reviewed  Physical Exam   VS:  BP (!) 132/102 (BP Location: Right Arm)   Pulse (!) 119   Temp 98.2 F (36.8 C) (Oral)   Resp 16   Ht 5' 10"  (1.778 m)   Wt 209 lb 10.5 oz (95.1 kg)   SpO2 98%   BMI 30.08 kg/m  , BMI Body mass index is 30.08 kg/m. GENERAL:  Critically ill-appearing.  Intubated and sedated.  HEENT: Pupils equal round and reactive, fundi not visualized, oral mucosa unremarkable NECK:  + jugular venous distention, waveform within normal limits, carotid upstroke brisk and symmetric, no bruits, no thyromegaly LUNGS:  Clear to auscultation bilaterally on anterior exam. HEART:  RRR.  PMI not displaced or sustained,S1 and S2 within normal limits, no S3, no S4, no clicks, no rubs, no murmurs ABD:  Flat, positive bowel sounds normal in frequency in pitch, no bruits,  no rebound, no guarding, no midline pulsatile mass, no hepatomegaly, no splenomegaly EXT:  2 plus pulses throughout, 2+ LE edema bilaterally, no cyanosis no clubbing SKIN:  No rashes no nodules NEURO:  Intubated and sedated.  Moves all four limbs freely PSYCH:  Unable to assess.  Intubated and sedated.    Labs    Chemistry Recent Labs  Lab 05/02/17 1049 05/02/17 1835 05/03/17 0328  05/04/17 0534 05/04/17 1447 05/05/17 0416  NA 144 148* 148*   < > 147* 145 141  K 6.5* 5.8* 4.9   < > 4.2 5.6* 4.1  CL 115* 116* 114*   < > 109 108 105  CO2 14* 15* 18*   < > 21* 24 22  GLUCOSE 121* 132* 164*   < > 213* 184* 139*  BUN 82* 89* 96*   < > 113* 97* 73*  CREATININE 4.97* 5.76* 6.33*   < > 7.51* 6.03* 5.04*  CALCIUM 7.4* 7.1*  6.5*   < > 6.5* 6.5* 7.0*  PROT 6.0* 5.8* 5.5*  --   --   --   --   ALBUMIN 1.9* 1.9* 1.7*  --  1.7* 1.6* 1.8*  AST 2,228* 1,471* 896*  --   --   --   --   ALT 957* 868* 716*  --   --   --   --   ALKPHOS 63 70 72  --   --   --   --   BILITOT 0.8 0.9 0.7  --   --   --   --   GFRNONAA 11* 9* 8*   < > 7* 9* 11*  GFRAA 13* 11* 10*   < > 8* 10* 13*  ANIONGAP 15 17* 16*   < > 17* 13 14   < > = values in this interval not displayed.     Hematology Recent Labs  Lab 05/03/17 0328 05/04/17 0534 05/05/17 0416  WBC 20.8* 18.2* 18.9*  RBC 3.27* 2.95* 3.07*  HGB 9.4* 8.4* 9.0*  HCT 29.5* 26.1* 27.1*  MCV 90.2 88.5 88.3  MCH 28.7 28.5 29.3  MCHC 31.9 32.2 33.2  RDW 14.2 14.2 14.2  PLT 112* 113* 117*    Cardiac Enzymes Recent Labs  Lab 05/02/17 0237 05/02/17 1049 05/02/17 1835 05/03/17 0328  TROPONINI 5.30* 5.65* 3.84* 2.29*   No results for input(s): TROPIPOC in the last 168 hours.   BNP Recent Labs  Lab 05/01/17 1954  BNP 208.4*     DDimer  Recent Labs  Lab 05/01/17 1954  DDIMER >20.00*     Radiology    LE Doppler 05/02/17:  + Bilateral DVTs  Cardiac Studies   Echo 05/02/17: Study Conclusions  - Left ventricle: The cavity size was normal. There was severe   concentric hypertrophy. Systolic function was vigorous. The   estimated ejection fraction was in the range of 65% to 70%.   Doppler parameters are consistent with abnormal left ventricular   relaxation (grade 1 diastolic dysfunction). The E/e&' ratio is <8,   suggesting normal LV filling pressure. - Aortic valve: Sclerosis without stenosis. There was no   regurgitation. - Aorta: Dilated aortic root. Aortic root dimension: 40 mm (ED). - Mitral valve: Mildly thickened leaflets . There was trivial   regurgitation. - Left atrium: The atrium was mildly dilated. - Right ventricle: Moderate to severely dilated and moderately   hypokinetic. Apical function is preserved (McConnell&'s sign).   Lateral annulus  peak S velocity: 7.94 cm/s. - Right  atrium: Moderately dilated. - Tricuspid valve: There was mild regurgitation. - Pulmonary arteries: PA peak pressure: 49 mm Hg (S). - Inferior vena cava: The vessel was dilated. The respirophasic   diameter changes were blunted (< 50%), consistent with elevated   central venous pressure. On vent.  Impressions:  - LVEF 65-70%, severe LVH, normal wall motion, grade 1 DD, normal   LV filling pressure, dilated aortic root to 4.0 cm, aortic   sclerosis with trivial AI, trivial MR, mild LAE, moderate to   severe RV dilitation with moderate hypokinesis with normal apical   function (McConnell&'s sign)- suggestive of RV strain (consider   pulmonary embolus), mild TR, RVSP 49 mmHg, dilated IVC (on vent).  Patient Profile     [phone] with homelessness and no known past medical history here after being found down and hypothermic.   He was initially hypotensive and tachycardic and had a leukocytosis. CXR concerning for CAP, urine culture positive for  Staph lugdeunensis. Troponin was elevated to 2.8.  He was improving and then became responsive and subsequently developed hypoxic respiratory failure requiring intubation.  He was found to have extensive lower extremity DVTs and presumptively PE.  Echo positive for RV failure and McConnell's sign. Hospitalization complicated by AKI and oliguria.  Assessment & Plan    # Pulmonary embolism: # Bilateral DVT: # RV failure: Patient likely decompensated 2/2 PE.  He remains on a heparin drip.  He has RV failure 2/2 PE.  Co-ox this AM was 61.8, up from 46 initially.  Continue dobutamine and levophed.  He is on dobutamine rather than levophed 2/2 AKI.  They have been unable to transition from levophed 2/2 tachycardia with dobutamine.  CVP is 14. He had fantastic diuresis yesterday after IV lasix.  He also started CVVHD.  Hopefully with volume removal his RV function will improve and he'll be able to wean from inotropes.  Continue  with lasix and CVVHD.  Will ask the advanced heart failure team to see him starting tomorrow.  # Elevated troponin: LV function is normal.  This is likely 2/2 RV strain in the setting of PE and demand ischemia.  No ischemia evaluation indicated at this time.  Consider when more stable.  # CAP: # Sepsis from a urinary source:  Antibiotics per critical care.  Leukocytosis persists.  PSA markedly elevated.  ?prostate malignancy-->hypercoagulable state.  # AKI: Lasix and CVVHD as above.   Time spent: 45 minutes-Greater than 50% of this time was spent in counseling, explanation of diagnosis, planning of further management, and coordination of care.   For questions or updates, please contact [place] Please consult [email or web address] for contact info under Cardiology/STEMI.      Signed, Skeet Latch, MD  05/05/2017, 8:16 AM

## 2017-05-05 NOTE — Plan of Care (Signed)
Pt does not follow commands, in restraints, on sedation, getting very agitated at time s(Versed PRN). CRRT. Frequent mouth care and turns done for preventions. Bed in low position, alarms are on, call bell in reach, continue to monitor

## 2017-05-06 ENCOUNTER — Inpatient Hospital Stay (HOSPITAL_COMMUNITY): Payer: Medicare Other

## 2017-05-06 DIAGNOSIS — I2609 Other pulmonary embolism with acute cor pulmonale: Secondary | ICD-10-CM

## 2017-05-06 DIAGNOSIS — R570 Cardiogenic shock: Secondary | ICD-10-CM

## 2017-05-06 LAB — CK: CK TOTAL: 2623 U/L — AB (ref 49–397)

## 2017-05-06 LAB — GLUCOSE, CAPILLARY
GLUCOSE-CAPILLARY: 119 mg/dL — AB (ref 65–99)
GLUCOSE-CAPILLARY: 122 mg/dL — AB (ref 65–99)
GLUCOSE-CAPILLARY: 141 mg/dL — AB (ref 65–99)
Glucose-Capillary: 120 mg/dL — ABNORMAL HIGH (ref 65–99)
Glucose-Capillary: 149 mg/dL — ABNORMAL HIGH (ref 65–99)
Glucose-Capillary: 123 mg/dL — ABNORMAL HIGH (ref 65–99)
Glucose-Capillary: 133 mg/dL — ABNORMAL HIGH (ref 65–99)

## 2017-05-06 LAB — HEPATIC FUNCTION PANEL
ALK PHOS: 109 U/L (ref 38–126)
ALT: 378 U/L — ABNORMAL HIGH (ref 17–63)
AST: 239 U/L — ABNORMAL HIGH (ref 15–41)
Albumin: 1.7 g/dL — ABNORMAL LOW (ref 3.5–5.0)
BILIRUBIN DIRECT: 0.1 mg/dL (ref 0.1–0.5)
BILIRUBIN INDIRECT: 0.6 mg/dL (ref 0.3–0.9)
TOTAL PROTEIN: 6.1 g/dL — AB (ref 6.5–8.1)
Total Bilirubin: 0.7 mg/dL (ref 0.3–1.2)

## 2017-05-06 LAB — RENAL FUNCTION PANEL
ALBUMIN: 1.7 g/dL — AB (ref 3.5–5.0)
Albumin: 1.7 g/dL — ABNORMAL LOW (ref 3.5–5.0)
Anion gap: 13 (ref 5–15)
Anion gap: 11 (ref 5–15)
BUN: 48 mg/dL — AB (ref 6–20)
BUN: 45 mg/dL — AB (ref 6–20)
CALCIUM: 7.4 mg/dL — AB (ref 8.9–10.3)
CALCIUM: 7.6 mg/dL — AB (ref 8.9–10.3)
CO2: 22 mmol/L (ref 22–32)
CO2: 23 mmol/L (ref 22–32)
CREATININE: 3.21 mg/dL — AB (ref 0.61–1.24)
Chloride: 103 mmol/L (ref 101–111)
Chloride: 103 mmol/L (ref 101–111)
Creatinine, Ser: 3.54 mg/dL — ABNORMAL HIGH (ref 0.61–1.24)
GFR calc Af Amer: 19 mL/min — ABNORMAL LOW (ref >60)
GFR calc non Af Amer: 17 mL/min — ABNORMAL LOW (ref >60)
GFR calc non Af Amer: 19 mL/min — ABNORMAL LOW (ref >60)
GFR, EST AFRICAN AMERICAN: 22 mL/min — AB (ref >60)
GLUCOSE: 158 mg/dL — AB (ref 65–99)
GLUCOSE: 130 mg/dL — AB (ref 65–99)
PHOSPHORUS: 3.8 mg/dL (ref 2.5–4.6)
Phosphorus: 3.8 mg/dL (ref 2.5–4.6)
Potassium: 4.7 mmol/L (ref 3.5–5.1)
Potassium: 4.5 mmol/L (ref 3.5–5.1)
SODIUM: 138 mmol/L (ref 135–145)
SODIUM: 137 mmol/L (ref 135–145)

## 2017-05-06 LAB — COOXEMETRY PANEL
CARBOXYHEMOGLOBIN: 0.9 % (ref 0.5–1.5)
Carboxyhemoglobin: 1.3 % (ref 0.5–1.5)
Carboxyhemoglobin: 1 % (ref 0.5–1.5)
Carboxyhemoglobin: 1.2 % (ref 0.5–1.5)
METHEMOGLOBIN: 0.7 % (ref 0.0–1.5)
METHEMOGLOBIN: 1.4 % (ref 0.0–1.5)
METHEMOGLOBIN: 0.8 % (ref 0.0–1.5)
METHEMOGLOBIN: 1.5 % (ref 0.0–1.5)
O2 Saturation: 60.6 %
O2 Saturation: 57 %
O2 Saturation: 43.1 %
O2 Saturation: 36.2 %
TOTAL HEMOGLOBIN: 9.2 g/dL — AB (ref 12.0–16.0)
Total hemoglobin: 7.8 g/dL — ABNORMAL LOW (ref 12.0–16.0)
Total hemoglobin: 8.8 g/dL — ABNORMAL LOW (ref 12.0–16.0)
Total hemoglobin: 9.5 g/dL — ABNORMAL LOW (ref 12.0–16.0)

## 2017-05-06 LAB — CULTURE, BLOOD (ROUTINE X 2)
CULTURE: NO GROWTH
Special Requests: ADEQUATE

## 2017-05-06 LAB — CBC
HCT: 27.6 % — ABNORMAL LOW (ref 39.0–52.0)
Hemoglobin: 8.8 g/dL — ABNORMAL LOW (ref 13.0–17.0)
MCH: 28.5 pg (ref 26.0–34.0)
MCHC: 31.9 g/dL (ref 30.0–36.0)
MCV: 89.3 fL (ref 78.0–100.0)
Platelets: 106 10*3/uL — ABNORMAL LOW (ref 150–400)
RBC: 3.09 MIL/uL — ABNORMAL LOW (ref 4.22–5.81)
RDW: 14.3 % (ref 11.5–15.5)
WBC: 21.2 10*3/uL — ABNORMAL HIGH (ref 4.0–10.5)

## 2017-05-06 LAB — HEPARIN LEVEL (UNFRACTIONATED): Heparin Unfractionated: 0.52 IU/mL (ref 0.30–0.70)

## 2017-05-06 LAB — MAGNESIUM: Magnesium: 2.2 mg/dL (ref 1.7–2.4)

## 2017-05-06 MED ORDER — SODIUM CHLORIDE 0.9 % IV SOLN
500.0000 mg | INTRAVENOUS | Status: DC
  Administered 2017-05-06: 500 mg via INTRAVENOUS
  Filled 2017-05-06 (×2): qty 0.5

## 2017-05-06 MED ORDER — SILVER SULFADIAZINE 1 % EX CREA
TOPICAL_CREAM | Freq: Every day | CUTANEOUS | Status: DC
  Administered 2017-05-07 – 2017-05-09 (×3): via TOPICAL
  Administered 2017-05-10: 1 via TOPICAL
  Administered 2017-05-11 – 2017-05-23 (×12): via TOPICAL
  Filled 2017-05-06 (×5): qty 85

## 2017-05-06 NOTE — Progress Notes (Signed)
PULMONARY / CRITICAL CARE MEDICINE   Name: Joseph Downs MRN: [id number] DOB: 04-21-1950    ADMISSION DATE:  05/01/2017 CONSULTATION DATE:  05/01/17  REFERRING MD:  Dr. Deretha EmoryZackowski  CHIEF COMPLAINT:  Found down  HISTORY OF PRESENT ILLNESS:        This is a 67 year old man who was found down.  It should be noted that it is been in the 40s for the past 24 hours.  He was tachypneic and hypotensive as well as hypothermic on arrival with a lactic acid in excess of 7.  He has received 3.5 L of crystalloid resuscitation along with vancomycin and Zosyn and he has been warmed with a bear hugger.  He is now alert and able to communicate with me.  He tells me that he had a syncopal episode last night but is a little hazy on the exact details.  He is not aware that he had any palpitations preceding that episode and he denies a prior history of seizure disorder.  He denies any new pains suggestive of an unprotected fall.  He denies use of street drugs or alcohol. Critical care service is asked to admit the patient because he still has a lactate acid in excess of 6 after being warmed and fluid resuscitated. Patient was initiated on bipap but would not tolerate mask. He was intubated shortly after admission.   SUBJECTIVE:  No acute changes. Remains on dobutamine 12.5 mcg/kg/min  VITAL SIGNS: BP 104/66 (BP Location: Right Arm)   Pulse (!) 108   Temp 100.3 F (37.9 C) (Axillary)   Resp (!) 22   Ht 5\' 10"  (1.778 m)   Wt 198 lb 13.7 oz (90.2 kg)   SpO2 99%   BMI 28.53 kg/m   HEMODYNAMICS: CVP:  [2 mmHg] 2 mmHg  VENTILATOR SETTINGS: Vent Mode: PRVC FiO2 (%):  [30 %] 30 % Set Rate:  [18 bmp] 18 bmp Vt Set:  [580 mL] 580 mL PEEP:  [5 cmH20] 5 cmH20 Plateau Pressure:  [7 cmH20-17 cmH20] 17 cmH20  INTAKE / OUTPUT: I/O last 3 completed shifts: In: 6595.4 [I.V.:2945.4; NG/GT:3000; IV Piggyback:650] Out: 8143 [Urine:4080; WUJWJ:1914Other:3593; Stool:470]  PHYSICAL EXAMINATION: General: Ill-appearing man,  lying in bed, sedated, intubated Neuro: sedated, intubated HEENT: ET tube in place, no oral lesions Cardiovascular: Regular, no murmur Lungs: Coarse bilateral breath sounds Abdomen: Soft, benign, positive bowel sounds Musculoskeletal: Bilateral lower extremity pitting edema, feet wrapped bilaterally  Skin: in tact  LABS:  BMET Recent Labs  Lab 05/05/17 0416 05/05/17 1929 05/06/17 0516  NA 141 133* 138  K 4.1 4.0 4.7  CL 105 101 103  CO2 22 21* 22  BUN 73* 54* 48*  CREATININE 5.04* 3.64* 3.54*  GLUCOSE 139* 176* 158*    Electrolytes Recent Labs  Lab 05/03/17 1915  05/05/17 0416 05/05/17 1929 05/06/17 0516  CALCIUM 6.0*   < > 7.0* 7.0* 7.4*  MG 2.2  --  2.3  --  2.2  PHOS 6.8*   < > 4.5 4.3 3.8   < > = values in this interval not displayed.    CBC Recent Labs  Lab 05/04/17 0534 05/05/17 0416 05/06/17 0516  WBC 18.2* 18.9* 21.2*  HGB 8.4* 9.0* 8.8*  HCT 26.1* 27.1* 27.6*  PLT 113* 117* 106*    Coag's No results for input(s): APTT, INR in the last 168 hours.  Sepsis Markers Recent Labs  Lab 05/01/17 1954 05/02/17 0237 05/02/17 1049 05/02/17 1835 05/03/17 0328  LATICACIDVEN 5.9*  --  1.5 1.9 1.2  PROCALCITON 14.36 19.69  --   --  22.90    ABG Recent Labs  Lab 05/01/17 2119 05/02/17 0419 05/03/17 0326  PHART 7.436 7.260* 7.336*  PCO2ART 21.6* 47.2 38.0  PO2ART 55.0* 349.0* 114*    Liver Enzymes Recent Labs  Lab 05/02/17 1835 05/03/17 0328  05/05/17 0416 05/05/17 1929 05/06/17 0516  AST 1,471* 896*  --   --   --  239*  ALT 868* 716*  --   --   --  378*  ALKPHOS 70 72  --   --   --  109  BILITOT 0.9 0.7  --   --   --  0.7  ALBUMIN 1.9* 1.7*   < > 1.8* 1.7* 1.7*  1.7*   < > = values in this interval not displayed.    Cardiac Enzymes Recent Labs  Lab 05/02/17 1049 05/02/17 1835 05/03/17 0328  TROPONINI 5.65* 3.84* 2.29*    Glucose Recent Labs  Lab 05/05/17 1250 05/05/17 1649 05/05/17 1918 05/06/17 0009 05/06/17 0330  05/06/17 0722  GLUCAP 144* 104* 152* 119* 120* 149*    Imaging Dg Chest Port 1 View  Result Date: 05/06/2017 CLINICAL DATA:  67 year old male with history of acute respiratory failure. EXAM: PORTABLE CHEST 1 VIEW COMPARISON:  Chest x-ray 05/05/2017. FINDINGS: An endotracheal tube is in place with tip 2.4 cm above the carina. There is a right-sided internal jugular central venous catheter with tip terminating in the distal superior vena cava. There is a left-sided internal jugular central venous catheter with tip terminating in the mid superior vena cava. A nasogastric tube is seen extending into the stomach, however, the tip of the nasogastric tube extends below the lower margin of the image. Lung volumes are low. Bibasilar opacities are favored to reflect areas of subsegmental atelectasis. Probable trace bilateral pleural effusions. No definite consolidative airspace disease. No pneumothorax. No evidence of pulmonary edema. Heart size is normal. Upper mediastinal contours are within normal limits. IMPRESSION: 1. Support apparatus, as above. 2. Low lung volumes with bibasilar subsegmental atelectasis and probable trace bilateral pleural effusions. Electronically Signed   By: Trudie Reed M.D.   On: 05/06/2017 06:59    STUDIES:  CXR 2/13 - no acute cardiopulmonary abnormality ECHO 2/14 - EF 65-70%, G1DD, RV strain  CULTURES: MRSA PCR negative Ucx + staph lugdenensis Blood cx 2/13 >> GPR + Acinetobacter lwoffii  ANTIBIOTICS: Vanc 2/13 > Zosyn 2/13>   SIGNIFICANT EVENTS: 2/13 admitted, intubated  LINES/TUBES: ETT 2/13 >>  PIV Foley 2/13 OGT 2/13 Right IJ HD catheter 2/15 >> Left IJ CVC 2/15 >>   DISCUSSION: 67 year old man found poorly responsive outside with evidence for shock and hypothermia.  He was intubated for encephalopathy and hemodynamic instability.  He had a lactic acidosis and acute renal failure, positive troponin.  Evaluation has revealed bilateral lower extremity  DVT, right heart strain by echocardiogram, probable PE.  He has polymicrobial bacteremia, no evidence of valvular disease by echo, not yet speciated.  Also with Staph lugdunensis UTI (often associated with prostate cancer).  Progressive oliguric renal failure, now CVVHD. Acinetobacter growing in blood.   ASSESSMENT / PLAN:  PULMONARY A: Acute respiratory failure Probable pulmonary embolism with associated right heart strain P:   Continue current ventilator support as we stabilize hemodynamics, metabolic status, renal function. Continue heparin infusion Continue scheduled bronchodilators  CARDIOVASCULAR A:  Stress related non-ST elevation MI, suspect right heart strain Cor pulmonale Cardiogenic shock P:  Appreciate cardiology input  Currently on dobutamine 12.5, plan to titrate down as he can tolerate, no lower than 5-7.5 Continue heparin drip Volume removal per CVVHD He will likely ultimately require an ischemic evaluation  RENAL A:   Acute oliguric renal failure- on CRRT Hyperkalemia Lactic acidosis- resolved Rhabdomyolysis-improving P:   Continue CVVHD and volume removal Discontinue bicarb drip want CVVH running Follow BMP, urine output Follow CK  GASTROINTESTINAL A:   NPO Elevated LFTs, suspect shock liver P:   Continue PPI Continue tube feeding Follow LFTs  HEMATOLOGIC / ONC A:   Bilateral DVTs Leukocytosis Anemia Elevated PSA, Staph lugdunensis both suggestive of possible prostatic CA P:  Follow CBC on heparin Transfusion threshold < 7 Continue heparin drip He will need further evaluation for possible prostate cancer once he stabilizes from his critical illness  INFECTIOUS A:   RLL infiltrate on CXR UTI-  >100,000 colonies staph lugdenenis, sensitivities pending Acinetobacter lwoffii bacteremia P:   Continue vancomycin and Zosyn pending sensitivities, consider switching to unasyn or meropenem due to acinetobacter Wound care for his bilateral foot  wounds Trend WBC, fevers Will c/s ID for bacteremia  GU A:  urinary retention P: PSA elevated Foley placed He will need an evaluation for prostate cancer once stabilized  ENDOCRINE A:   hyperglycemia P:   Follow CBG Sliding scale insulin per protocol  WOUND CARE A: Bilateral feet with evidence for skin sloughing versus debridement.  The area looks well cared for and suspect that this was surgically debrided although do not have any current documentation P: Continue current dressing changes  NEUROLOGIC A:   Toxic metabolic encephalopathy Sedation for ventilator tolerance P:   RASS goal: -1 Continue fentanyl, Precedex Versed if needed Wean sedation as we are able  FAMILY  - Updates: no family at bedside - Inter-disciplinary family meet or Palliative Care meeting due by:  2/20   Dolores Patty, DO PGY-2, [facility] 05/06/2017 8:41 AM

## 2017-05-06 NOTE — Progress Notes (Signed)
Advanced Heart Failure Rounding Note  PCP-Cardiologist: Skeet Latch, MD   Subjective:     Currently on dobutamine 12.5 mcg. Off NE. On CRRT pulling -50. Remains intubated/sedated. BP soft.    Objective:   Weight Range: 198 lb 13.7 oz (90.2 kg) Body mass index is 28.53 kg/m.   Vital Signs:   Temp:  [97.1 F (36.2 C)-100.3 F (37.9 C)] 100.3 F (37.9 C) (02/18 0724) Pulse Rate:  [95-127] 108 (02/18 0830) Resp:  [15-31] 22 (02/18 0830) BP: (98-118)/(40-66) 104/66 (02/18 0800) SpO2:  [96 %-100 %] 99 % (02/18 0830) Arterial Line BP: (104-144)/(40-73) 118/54 (02/18 0830) FiO2 (%):  [30 %] 30 % (02/18 0800) Weight:  [198 lb 13.7 oz (90.2 kg)] 198 lb 13.7 oz (90.2 kg) (02/18 0500) Last BM Date: 05/04/17  Weight change: Filed Weights   05/04/17 0500 05/05/17 0432 05/06/17 0500  Weight: 203 lb 7.8 oz (92.3 kg) 209 lb 10.5 oz (95.1 kg) 198 lb 13.7 oz (90.2 kg)    Intake/Output:   Intake/Output Summary (Last 24 hours) at 05/06/2017 0920 Last data filed at 05/06/2017 0800 Gross per 24 hour  Intake 3900.18 ml  Output 4459 ml  Net -558.82 ml      Physical Exam   CVP 10-11 General:  Intubated/sedated HEENT: Normal ETT ednetuolous Neck: Supple. JVP to jaw . Carotids 2+ bilat; no bruits. No lymphadenopathy or thyromegaly appreciated. RIJ HD cath Cor: PMI nondisplaced. Tachy regular  No rubs, gallops or murmurs. Lungs: Crackles in the bases Abdomen: Soft, nontender, + distended. No hepatosplenomegaly. No bruits or masses. Good bowel sounds. Extremities: No cyanosis, clubbing, rash, R and LLE 2+ edema R and LLE wraps in place Neuro: intubated sedated.    Telemetry   Sinus Tach 110s   EKG    N/A  Labs    CBC Recent Labs    05/05/17 0416 05/06/17 0516  WBC 18.9* 21.2*  HGB 9.0* 8.8*  HCT 27.1* 27.6*  MCV 88.3 89.3  PLT 117* 671*   Basic Metabolic Panel Recent Labs    05/05/17 0416 05/05/17 1929 05/06/17 0516  NA 141 133* 138  K 4.1 4.0 4.7    CL 105 101 103  CO2 22 21* 22  GLUCOSE 139* 176* 158*  BUN 73* 54* 48*  CREATININE 5.04* 3.64* 3.54*  CALCIUM 7.0* 7.0* 7.4*  MG 2.3  --  2.2  PHOS 4.5 4.3 3.8   Liver Function Tests Recent Labs    05/05/17 1929 05/06/17 0516  AST  --  239*  ALT  --  378*  ALKPHOS  --  109  BILITOT  --  0.7  PROT  --  6.1*  ALBUMIN 1.7* 1.7*  1.7*   No results for input(s): LIPASE, AMYLASE in the last 72 hours. Cardiac Enzymes Recent Labs    05/04/17 0534 05/05/17 0416 05/06/17 0516  CKTOTAL 3,649* 3,198* 2,623*    BNP: BNP (last 3 results) Recent Labs    05/01/17 1954  BNP 208.4*    ProBNP (last 3 results) No results for input(s): PROBNP in the last 8760 hours.   D-Dimer No results for input(s): DDIMER in the last 72 hours. Hemoglobin A1C No results for input(s): HGBA1C in the last 72 hours. Fasting Lipid Panel No results for input(s): CHOL, HDL, LDLCALC, TRIG, CHOLHDL, LDLDIRECT in the last 72 hours. Thyroid Function Tests No results for input(s): TSH, T4TOTAL, T3FREE, THYROIDAB in the last 72 hours.  Invalid input(s): FREET3  Other results:   Imaging  Dg Chest Port 1 View  Result Date: 05/06/2017 CLINICAL DATA:  67 year old male with history of acute respiratory failure. EXAM: PORTABLE CHEST 1 VIEW COMPARISON:  Chest x-ray 05/05/2017. FINDINGS: An endotracheal tube is in place with tip 2.4 cm above the carina. There is a right-sided internal jugular central venous catheter with tip terminating in the distal superior vena cava. There is a left-sided internal jugular central venous catheter with tip terminating in the mid superior vena cava. A nasogastric tube is seen extending into the stomach, however, the tip of the nasogastric tube extends below the lower margin of the image. Lung volumes are low. Bibasilar opacities are favored to reflect areas of subsegmental atelectasis. Probable trace bilateral pleural effusions. No definite consolidative airspace disease.  No pneumothorax. No evidence of pulmonary edema. Heart size is normal. Upper mediastinal contours are within normal limits. IMPRESSION: 1. Support apparatus, as above. 2. Low lung volumes with bibasilar subsegmental atelectasis and probable trace bilateral pleural effusions. Electronically Signed   By: Vinnie Langton M.D.   On: 05/06/2017 06:59      Medications:     Scheduled Medications: . aspirin  81 mg Per Tube Daily  . chlorhexidine gluconate (MEDLINE KIT)  15 mL Mouth Rinse BID  . free water  100 mL Per Tube Q6H  . furosemide  80 mg Intravenous Q12H  . insulin aspart  2-6 Units Subcutaneous Q4H  . ipratropium-albuterol  3 mL Nebulization Q6H  . mouth rinse  15 mL Mouth Rinse QID  . pantoprazole (PROTONIX) IV  40 mg Intravenous QHS  . silver sulfADIAZINE   Topical BID  . thiamine injection  100 mg Intravenous Daily     Infusions: . sodium chloride    . dexmedetomidine (PRECEDEX) IV infusion 1 mcg/kg/hr (05/06/17 0800)  . DOBUTamine 12.5 mcg/kg/min (05/06/17 0800)  . feeding supplement (VITAL AF 1.2 CAL) 1,000 mL (05/06/17 0700)  . fentaNYL infusion INTRAVENOUS 150 mcg/hr (05/06/17 0800)  . heparin 1,350 Units/hr (05/06/17 0800)  . heparin    . norepinephrine (LEVOPHED) Adult infusion Stopped (05/05/17 2205)  . piperacillin-tazobactam (ZOSYN)  IV Stopped (05/06/17 0819)  . dialysis replacement fluid (prismasate) 500 mL/hr at 05/06/17 0352  . dialysis replacement fluid (prismasate) 300 mL/hr at 05/05/17 2104  . dialysate (PRISMASATE) 1,000 mL/hr at 05/06/17 0347  . vancomycin Stopped (05/05/17 2222)     PRN Medications:  Place/Maintain arterial line **AND** sodium chloride, docusate, fentaNYL, heparin, heparin, midazolam    Patient Profile    25M with homelessness and no known past medical history here after being found down and hypothermic. He was initially hypotensive and tachycardic and had a leukocytosis. CXR concerning for CAP, urine culture positive for  Staph lugdeunensis.Troponin was elevated to 2.8. He was improving and then became responsive and subsequently developed hypoxic respiratory failure requiring intubation. He was found to have extensive lower extremity DVTs and presumptively PE (no CT due to AKI). Echo positive for RV failure and McConnell's sign. Hospitalization complicated by AKI and oliguria.    Assessment/Plan  1. Acute RV failure/cor pulmonale - echo LVEF 65-70% with severe RV HK - likely massive PE (has extensive DVT on LE u/s). No CT with AKI - continue heparin. - currently on dobutamine 12.5 mcg. BP ok. Cut back dobutamine to 10 mcg.   - off NE. CO-OX 61%.  - situation complicated by possible sepsis - given mixed picture would actually favor NE for support but seems to be tolerating dobutamine well so will continue. IF BP gets soft  would focus on using NE - With PE is very preload sensitive. Continue to pull/diurese slowly.  - may need to consider IVC filter given burden of LE clot - long-term prognosis is guarded  2. Shock - likely mixed cardiogenic (RV) and septic - Todays CO-OX is 61% on dobutamine 12.5 mcg.   - 4. Elevated troponin - due to PE and RV strain. ECG ok no evidence of regional LV wall motionaabnormality to suggest ACS  5. Acute respiratory failure - due to #1. Also PNA on CXR - Remains on vent per CCM.  6. AKI - due to shock and rhabdo on CVVHD. Renal function seems to be recovering - Nephrology managing.    7. Sepsis - Staph lugdunensis in urine (with elevated PSA (42) suggestive of possible prostate CA) - ACINETOBACTER LWOFFII in blood  - WBC trending up. Temp 100.3  - on abx per CCM  8. Frostbite with LE skin loss - WOC following  9. Shock liver - improving with support   10. Homelessness   Medication concerns reviewed with patient and pharmacy team. Barriers identified: homeless  Length of Stay: [place], NP  05/06/2017, 9:20 AM  Advanced Heart Failure  Team Pager [phone] (M-F; 7a - 4p)  Please contact [facility] for night-coverage after hours (4p -7a ) and weekends on [email or web address]  Agree with above  He remains critically ill in setting of massive PE, acute cor pulmonale, sepsis and AKI. Now off NE but BP soft on dobutamine 12.5. Remains on CVVHD pulling -50. Urine output also picking up. CVP 10.   On exam Intubated/sedated RIJ trialysis cath Cor Tachy regular Ab distended Ext wrapped 2+ edema  He has mixed cardiogenic (RV) and septic shock complicated by acute respiratory failure and AKI. Hemodynamics remain very tenuous. Would wean dobutamine and use NE as primary agent for support. He remains volume overloaded but would proceed slowly with fluid removal as he is very preload dependent with RV failure. Given burden of LE clot need to consider IVC filter. Continue heparin.   CRITICAL CARE Performed by: Glori Bickers  Total critical care time: 35 minutes  Critical care time was exclusive of separately billable procedures and treating other patients.  Critical care was necessary to treat or prevent imminent or life-threatening deterioration.  Critical care was time spent personally by me (independent of midlevel providers or residents) on the following activities: development of treatment plan with patient and/or surrogate as well as nursing, discussions with consultants, evaluation of patient's response to treatment, examination of patient, obtaining history from patient or surrogate, ordering and performing treatments and interventions, ordering and review of laboratory studies, ordering and review of radiographic studies, pulse oximetry and re-evaluation of patient's condition.  Glori Bickers, MD  5:20 PM

## 2017-05-06 NOTE — Progress Notes (Signed)
Progress Note  Patient Name: Joseph Downs Date of Encounter: 05/06/2017  Primary Cardiologist: Skeet Latch, MD   Subjective   67 year old gentleman who is homeless.  He was found down.  He was hernia and profound lactic acidosis.  He has extensive DVT and has RV failure-presumed to be due to a large pulmonary embolus.  He remains on pressors for  support. He remains on the ventilator. Marland Kitchen  He is getting CVVH. Inpatient Medications    Scheduled Meds: . aspirin  81 mg Per Tube Daily  . chlorhexidine gluconate (MEDLINE KIT)  15 mL Mouth Rinse BID  . free water  100 mL Per Tube Q6H  . furosemide  80 mg Intravenous Q12H  . insulin aspart  2-6 Units Subcutaneous Q4H  . ipratropium-albuterol  3 mL Nebulization Q6H  . mouth rinse  15 mL Mouth Rinse QID  . pantoprazole (PROTONIX) IV  40 mg Intravenous QHS  . silver sulfADIAZINE   Topical BID  . thiamine injection  100 mg Intravenous Daily   Continuous Infusions: . sodium chloride    . dexmedetomidine (PRECEDEX) IV infusion 1 mcg/kg/hr (05/06/17 0800)  . DOBUTamine 12.5 mcg/kg/min (05/06/17 0800)  . feeding supplement (VITAL AF 1.2 CAL) 1,000 mL (05/06/17 0700)  . fentaNYL infusion INTRAVENOUS 150 mcg/hr (05/06/17 0800)  . heparin 1,350 Units/hr (05/06/17 0800)  . heparin    . norepinephrine (LEVOPHED) Adult infusion Stopped (05/05/17 2205)  . piperacillin-tazobactam (ZOSYN)  IV 3.375 g (05/06/17 0719)  . dialysis replacement fluid (prismasate) 500 mL/hr at 05/06/17 0352  . dialysis replacement fluid (prismasate) 300 mL/hr at 05/05/17 2104  . dialysate (PRISMASATE) 1,000 mL/hr at 05/06/17 0347  . vancomycin Stopped (05/05/17 2222)   PRN Meds: Place/Maintain arterial line **AND** sodium chloride, docusate, fentaNYL, heparin, heparin, midazolam   Vital Signs    Vitals:   05/06/17 0500 05/06/17 0724 05/06/17 0730 05/06/17 0800  BP:    104/66  Pulse:   (!) 109 (!) 112  Resp:   20 (!) 24  Temp:  100.3 F (37.9 C)      TempSrc:  Axillary    SpO2:   99% 99%  Weight: 198 lb 13.7 oz (90.2 kg)     Height:        Intake/Output Summary (Last 24 hours) at 05/06/2017 0815 Last data filed at 05/06/2017 0800 Gross per 24 hour  Intake 4053.04 ml  Output 4958 ml  Net -904.96 ml   Filed Weights   05/04/17 0500 05/05/17 0432 05/06/17 0500  Weight: 203 lb 7.8 oz (92.3 kg) 209 lb 10.5 oz (95.1 kg) 198 lb 13.7 oz (90.2 kg)    Telemetry    Sinus tach  - Personally Reviewed  ECG     sinus tach  - Personally Reviewed  Physical Exam   GEN:  Middle-aged gentleman.  He appears to be chronically ill.  He remains on a ventilator and is sedated. Neck: No JVD Cardiac: RRR, soft systolic murmur.  Respiratory:  On ventilator. GI: Soft, nontender, non-distended  MS:  2+ edema.  Legs are wrapped. Neuro:   Unresponsive Psych: NN/A   Labs    Chemistry Recent Labs  Lab 05/02/17 1835 05/03/17 0328  05/05/17 0416 05/05/17 1929 05/06/17 0516  NA 148* 148*   < > 141 133* 138  K 5.8* 4.9   < > 4.1 4.0 4.7  CL 116* 114*   < > 105 101 103  CO2 15* 18*   < > 22 21* 22  GLUCOSE 132* 164*   < > 139* 176* 158*  BUN 89* 96*   < > 73* 54* 48*  CREATININE 5.76* 6.33*   < > 5.04* 3.64* 3.54*  CALCIUM 7.1* 6.5*   < > 7.0* 7.0* 7.4*  PROT 5.8* 5.5*  --   --   --  6.1*  ALBUMIN 1.9* 1.7*   < > 1.8* 1.7* 1.7*  1.7*  AST 1,471* 896*  --   --   --  239*  ALT 868* 716*  --   --   --  378*  ALKPHOS 70 72  --   --   --  109  BILITOT 0.9 0.7  --   --   --  0.7  GFRNONAA 9* 8*   < > 11* 16* 17*  GFRAA 11* 10*   < > 13* 19* 19*  ANIONGAP 17* 16*   < > 14 11 13    < > = values in this interval not displayed.     Hematology Recent Labs  Lab 05/04/17 0534 05/05/17 0416 05/06/17 0516  WBC 18.2* 18.9* 21.2*  RBC 2.95* 3.07* 3.09*  HGB 8.4* 9.0* 8.8*  HCT 26.1* 27.1* 27.6*  MCV 88.5 88.3 89.3  MCH 28.5 29.3 28.5  MCHC 32.2 33.2 31.9  RDW 14.2 14.2 14.3  PLT 113* 117* 106*    Cardiac Enzymes Recent Labs  Lab  05/02/17 0237 05/02/17 1049 05/02/17 1835 05/03/17 0328  TROPONINI 5.30* 5.65* 3.84* 2.29*   No results for input(s): TROPIPOC in the last 168 hours.   BNP Recent Labs  Lab 05/01/17 1954  BNP 208.4*     DDimer  Recent Labs  Lab 05/01/17 1954  DDIMER >20.00*     Radiology    Dg Chest Port 1 View  Result Date: 05/06/2017 CLINICAL DATA:  67 year old male with history of acute respiratory failure. EXAM: PORTABLE CHEST 1 VIEW COMPARISON:  Chest x-ray 05/05/2017. FINDINGS: An endotracheal tube is in place with tip 2.4 cm above the carina. There is a right-sided internal jugular central venous catheter with tip terminating in the distal superior vena cava. There is a left-sided internal jugular central venous catheter with tip terminating in the mid superior vena cava. A nasogastric tube is seen extending into the stomach, however, the tip of the nasogastric tube extends below the lower margin of the image. Lung volumes are low. Bibasilar opacities are favored to reflect areas of subsegmental atelectasis. Probable trace bilateral pleural effusions. No definite consolidative airspace disease. No pneumothorax. No evidence of pulmonary edema. Heart size is normal. Upper mediastinal contours are within normal limits. IMPRESSION: 1. Support apparatus, as above. 2. Low lung volumes with bibasilar subsegmental atelectasis and probable trace bilateral pleural effusions. Electronically Signed   By: Vinnie Langton M.D.   On: 05/06/2017 06:59   Dg Chest Port 1 View  Result Date: 05/05/2017 CLINICAL DATA:  Acute respiratory failure. EXAM: PORTABLE CHEST 1 VIEW COMPARISON:  05/04/2017 FINDINGS: 0632 hours. Rotated film. Endotracheal tube not well seen but may still be in place. The NG tube passes into the stomach although the distal tip position is not included on the film. Right IJ central line tip overlies the region of the innominate vein confluence. Left IJ central line tip is also positioned over  the innominate vein confluence. Bibasilar atelectasis. Heart size within normal limits. Telemetry leads overlie the chest. IMPRESSION: Stable exam with bibasilar atelectasis. Electronically Signed   By: Misty Stanley M.D.   On: 05/05/2017 09:05  Cardiac Studies     Patient Profile     67 y.o. male who was found down.  He was hypothermic, hypotensive and had a lactic acidosis.  Assessment & Plan    1.  Cardiogenic shock: This appears to be due to a large pulmonary embolus.  He has evidence of RV failure on echo. Continue support with dopamine and norepinephrine.  2.  Pulmonary emboli: The patient has extensive DVT.  Continue heparin.  3.  Sepsis syndrome: Plans per pulmonary/critical care medicine  For questions or updates, please contact [facility] Please consult [email or web address] for contact info under Cardiology/STEMI.      Signed, Mertie Moores, MD  05/06/2017, 8:15 AM

## 2017-05-06 NOTE — Procedures (Signed)
Admit: 05/01/2017 LOS: 5  15M with AKI on CRRT after likely massive PE, shock, bacteremia  Current CRRT Prescription: Start Date: 05/04/17 Catheter: TEMP R IJ placed 2/15 CCM BFR:  Pre Blood Pump: 500 4K DFR: 1000 4K Replacement Rate: 300 4K Goal UF: 16m hr net neg Anticoagulation: systemic heparin Clotting: infrequent  S: 2.9L UOP yesterday CK downtrending K 4.7  O: 02/17 0701 - 02/18 0700 In: 4109.6 [I.V.:1709.6; NG/GT:2000; IV Piggyback:400] Out: 4950 [Urine:2920; Stool:320]  Filed Weights   05/04/17 0500 05/05/17 0432 05/06/17 0500  Weight: 92.3 kg (203 lb 7.8 oz) 95.1 kg (209 lb 10.5 oz) 90.2 kg (198 lb 13.7 oz)    Recent Labs  Lab 05/05/17 0416 05/05/17 1929 05/06/17 0516  NA 141 133* 138  K 4.1 4.0 4.7  CL 105 101 103  CO2 22 21* 22  GLUCOSE 139* 176* 158*  BUN 73* 54* 48*  CREATININE 5.04* 3.64* 3.54*  CALCIUM 7.0* 7.0* 7.4*  PHOS 4.5 4.3 3.8   Recent Labs  Lab 05/01/17 1110  05/04/17 0534 05/05/17 0416 05/06/17 0516  WBC 15.6*   < > 18.2* 18.9* 21.2*  NEUTROABS 13.0*  --   --   --   --   HGB 13.1   < > 8.4* 9.0* 8.8*  HCT 41.3   < > 26.1* 27.1* 27.6*  MCV 92.0   < > 88.5 88.3 89.3  PLT 181   < > 113* 117* 106*   < > = values in this interval not displayed.    Scheduled Meds: . aspirin  81 mg Per Tube Daily  . chlorhexidine gluconate (MEDLINE KIT)  15 mL Mouth Rinse BID  . free water  100 mL Per Tube Q6H  . furosemide  80 mg Intravenous Q12H  . insulin aspart  2-6 Units Subcutaneous Q4H  . ipratropium-albuterol  3 mL Nebulization Q6H  . mouth rinse  15 mL Mouth Rinse QID  . pantoprazole (PROTONIX) IV  40 mg Intravenous QHS  . silver sulfADIAZINE   Topical BID  . thiamine injection  100 mg Intravenous Daily   Continuous Infusions: . sodium chloride    . dexmedetomidine (PRECEDEX) IV infusion 1 mcg/kg/hr (05/06/17 0800)  . DOBUTamine 12.5 mcg/kg/min (05/06/17 0800)  . feeding supplement (VITAL AF 1.2 CAL) 1,000 mL (05/06/17 0700)  .  fentaNYL infusion INTRAVENOUS 150 mcg/hr (05/06/17 0800)  . heparin 1,350 Units/hr (05/06/17 0800)  . heparin    . norepinephrine (LEVOPHED) Adult infusion Stopped (05/05/17 2205)  . piperacillin-tazobactam (ZOSYN)  IV Stopped (05/06/17 0819)  . dialysis replacement fluid (prismasate) 500 mL/hr at 05/06/17 0352  . dialysis replacement fluid (prismasate) 300 mL/hr at 05/05/17 2104  . dialysate (PRISMASATE) 1,000 mL/hr at 05/06/17 0347  . vancomycin Stopped (05/05/17 2222)   PRN Meds:.Place/Maintain arterial line **AND** sodium chloride, docusate, fentaNYL, heparin, heparin, midazolam  ABG    Component Value Date/Time   PHART 7.336 (L) 05/03/2017 0326   PCO2ART 38.0 05/03/2017 0326   PO2ART 114 (H) 05/03/2017 0326   HCO3 19.8 (L) 05/03/2017 0326   TCO2 23 05/02/2017 0419   ACIDBASEDEF 5.0 (H) 05/03/2017 0326   O2SAT 57.0 05/06/2017 0825    A/P  1. 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 B/l DVT and presumed PE on heparin 4. RV Failure on dobutamine 5. Anemia 6. VDRF 7. Homelessness 8. Sepsis with UTI and Acinetobacter bacteremia vanc/zosyn  Cont CRRt for at least another 24h to cont to improve his volume status. All 4K. Likely can stop CRRt and monitor in next 24-48h.   Pearson Grippe, MD [facility] pgr [phone]

## 2017-05-06 NOTE — Progress Notes (Signed)
Repeat co ox drawn post increase of levophed to 22mcg, with a result of 36.  MD Bensimhon made aware. TVO received to re-start dobutamine and keep at 7.5  Will monitor  Joseph Downs, [name]

## 2017-05-06 NOTE — Progress Notes (Signed)
1600 co-ox resulted with an O2 of 43. MD Bensimhon made aware, as he gave a VO to wean off dobutamine and re-start levophed earlier this morning.   Dobutamine was off at time of result. TVO received to increase levophed 22mcg.   Will monitor  Felipa EmoryJarrell, Domnique Vanegas Denise

## 2017-05-06 NOTE — Progress Notes (Signed)
ANTICOAGULATION CONSULT NOTE - Follow- Up Consult  Pharmacy Consult for Heparin  Indication: chest pain/ACS / new BL DVTs/possible PE  No Known Allergies  Patient Measurements: Height: 5\' 10"  (177.8 cm) Weight: 198 lb 13.7 oz (90.2 kg) IBW/kg (Calculated) : 73  Vital Signs: Temp: 100.3 F (37.9 C) (02/18 0724) Temp Source: Axillary (02/18 0724) BP: 104/66 (02/18 0800) Pulse Rate: 112 (02/18 0800)  Labs: Recent Labs    05/04/17 0534  05/04/17 2053 05/05/17 0416 05/05/17 1929 05/06/17 0516  HGB 8.4*  --   --  9.0*  --  8.8*  HCT 26.1*  --   --  27.1*  --  27.6*  PLT 113*  --   --  117*  --  106*  HEPARINUNFRC <0.10*   < > 0.51 0.62  --  0.52  CREATININE 7.51*   < >  --  5.04* 3.64* 3.54*  CKTOTAL 3,649*  --   --  3,198*  --  2,623*   < > = values in this interval not displayed.    Estimated Creatinine Clearance: 23.2 mL/min (A) (by C-G formula based on SCr of 3.54 mg/dL (H)).  Assessment: [name] in the ICU after being "found down".Patient has been found to have bilateral lower extremity with concern for possible PE.  Heparin level today is therapeutic at 0.52 on 1350 units/hr. CBC is down slightly. No signs of bleeding have been noted.    Goal of Therapy:  Heparin level 0.3-0.7 units/ml Monitor platelets by anticoagulation protocol: Yes   Plan:  Continue heparin at1350 units/hr Daily heparin level and CBC Follow for long term plans for anticoagulation  Sharin MonsEmily Sinclair, PharmD, BCPS PGY2 Infectious Diseases Pharmacy Resident Pager: [phone]  05/06/2017 8:33 AM

## 2017-05-06 NOTE — Consult Note (Signed)
WOC Nurse wound consult note Reason for Consult: Consult requested for BLE.  Pt has suspected frostbite and previously reported blisters have evolved into full thickness tissue loss in patchy areas. Right outer foot 5X12X.2cm, red moist wound bed. Right outer ankle 7X7X.2cm, with the same appearance. Right posterior ankle and heel with the same appearance; 14X5X.2cm Mod amt yellow drainage, no odor.  Trimmed loose peeling skin away from wound edges using scissors to remove. Pt tolerated without apparent discomfort or bleeding. Left foot with black eschar to toes. Posterior leg with patchy areas of slough/eschar; 10X3cm Anterior foot and plantar foot with affected areas; 20X30X.2cm, red moist wound bed with mod amt yellow drainage. Dressing procedure/placement/frequency:Continue present plan of care with Silvadene; which has already been ordered.  Prevalon boots to reduce pressure to BLE.  No family members present to discuss plan of care.  Please consider ABI and vascular consult for frost-bite to toes; topical treatment will not be effective for these sites. Please re-consult if further assistance is needed.  Thank-you,  Cammie Mcgeeawn Daesia Zylka MSN, RN, CWOCN, [place]AP, CNS [phone]

## 2017-05-07 ENCOUNTER — Inpatient Hospital Stay (HOSPITAL_COMMUNITY): Payer: Medicare Other

## 2017-05-07 DIAGNOSIS — T34821A Frostbite with tissue necrosis of right foot, initial encounter: Secondary | ICD-10-CM

## 2017-05-07 DIAGNOSIS — I82403 Acute embolism and thrombosis of unspecified deep veins of lower extremity, bilateral: Secondary | ICD-10-CM

## 2017-05-07 DIAGNOSIS — T34822A Frostbite with tissue necrosis of left foot, initial encounter: Secondary | ICD-10-CM

## 2017-05-07 LAB — CBC
HCT: 29.3 % — ABNORMAL LOW (ref 39.0–52.0)
HEMOGLOBIN: 9.4 g/dL — AB (ref 13.0–17.0)
MCH: 29 pg (ref 26.0–34.0)
MCHC: 32.1 g/dL (ref 30.0–36.0)
MCV: 90.4 fL (ref 78.0–100.0)
PLATELETS: 95 10*3/uL — AB (ref 150–400)
RBC: 3.24 MIL/uL — ABNORMAL LOW (ref 4.22–5.81)
RDW: 14.4 % (ref 11.5–15.5)
WBC: 23.4 10*3/uL — AB (ref 4.0–10.5)

## 2017-05-07 LAB — RENAL FUNCTION PANEL
ANION GAP: 11 (ref 5–15)
Albumin: 1.6 g/dL — ABNORMAL LOW (ref 3.5–5.0)
BUN: 42 mg/dL — AB (ref 6–20)
CALCIUM: 7.7 mg/dL — AB (ref 8.9–10.3)
CO2: 23 mmol/L (ref 22–32)
Chloride: 103 mmol/L (ref 101–111)
Creatinine, Ser: 2.85 mg/dL — ABNORMAL HIGH (ref 0.61–1.24)
GFR calc Af Amer: 25 mL/min — ABNORMAL LOW (ref >60)
GFR, EST NON AFRICAN AMERICAN: 22 mL/min — AB (ref >60)
GLUCOSE: 149 mg/dL — AB (ref 65–99)
PHOSPHORUS: 3.8 mg/dL (ref 2.5–4.6)
POTASSIUM: 4.4 mmol/L (ref 3.5–5.1)
SODIUM: 137 mmol/L (ref 135–145)

## 2017-05-07 LAB — HEPATIC FUNCTION PANEL
ALT: 326 U/L — ABNORMAL HIGH (ref 17–63)
AST: 177 U/L — AB (ref 15–41)
Albumin: 1.7 g/dL — ABNORMAL LOW (ref 3.5–5.0)
Alkaline Phosphatase: 115 U/L (ref 38–126)
BILIRUBIN TOTAL: 0.6 mg/dL (ref 0.3–1.2)
Total Protein: 6.9 g/dL (ref 6.5–8.1)

## 2017-05-07 LAB — GLUCOSE, CAPILLARY
GLUCOSE-CAPILLARY: 140 mg/dL — AB (ref 65–99)
GLUCOSE-CAPILLARY: 141 mg/dL — AB (ref 65–99)
GLUCOSE-CAPILLARY: 159 mg/dL — AB (ref 65–99)
Glucose-Capillary: 162 mg/dL — ABNORMAL HIGH (ref 65–99)
Glucose-Capillary: 173 mg/dL — ABNORMAL HIGH (ref 65–99)
Glucose-Capillary: 81 mg/dL (ref 65–99)

## 2017-05-07 LAB — COOXEMETRY PANEL
Carboxyhemoglobin: 0.9 % (ref 0.5–1.5)
METHEMOGLOBIN: 1 % (ref 0.0–1.5)
O2 Saturation: 48.9 %
Total hemoglobin: 9.7 g/dL — ABNORMAL LOW (ref 12.0–16.0)

## 2017-05-07 LAB — MAGNESIUM: MAGNESIUM: 2.4 mg/dL (ref 1.7–2.4)

## 2017-05-07 LAB — HEPARIN LEVEL (UNFRACTIONATED): Heparin Unfractionated: 0.65 IU/mL (ref 0.30–0.70)

## 2017-05-07 MED ORDER — SODIUM CHLORIDE 3 % IN NEBU
4.0000 mL | INHALATION_SOLUTION | Freq: Three times a day (TID) | RESPIRATORY_TRACT | Status: AC
  Administered 2017-05-07 – 2017-05-10 (×8): 4 mL via RESPIRATORY_TRACT
  Filled 2017-05-07 (×9): qty 4

## 2017-05-07 MED ORDER — SODIUM CHLORIDE 0.9 % IV SOLN
1.0000 g | Freq: Two times a day (BID) | INTRAVENOUS | Status: DC
  Administered 2017-05-07: 1 g via INTRAVENOUS
  Filled 2017-05-07: qty 1

## 2017-05-07 MED ORDER — SODIUM CHLORIDE 0.9 % IV SOLN
500.0000 mg | INTRAVENOUS | Status: AC
  Administered 2017-05-07 – 2017-05-19 (×13): 500 mg via INTRAVENOUS
  Filled 2017-05-07 (×14): qty 0.5

## 2017-05-07 NOTE — Consult Note (Addendum)
Patient name: Joseph Downs MRN: [id number] DOB: 24-Oct-1950 Sex: male   REASON FOR CONSULT:    Frostbite of both feet with no Doppler signals.  Consult is requested by critical care medicine.  HPI:   Joseph Downs is a pleasant 67 y.o. male, who was reportedly in a homeless man who was found down for 13 hours with hypothermia and lactic acidosis.  He was found to have bilateral lower extremity DVTs and had frostbite of both feet.  He did not have Doppler signals in his feet and therefore vascular surgery was consulted.  Of note, the patient is intubated and sedated I am unable to obtain any history from the patient.  There is no family currently available.  No past medical history on file.  Unable to obtain  Family History  Problem Relation Age of Onset  . Cancer Father   Unable to obtain  SOCIAL HISTORY: Social History   Socioeconomic History  . Marital status: Married    Spouse name: Not on file  . Number of children: Not on file  . Years of education: Not on file  . Highest education level: Not on file  Social Needs  . Financial resource strain: Not on file  . Food insecurity - worry: Not on file  . Food insecurity - inability: Not on file  . Transportation needs - medical: Not on file  . Transportation needs - non-medical: Not on file  Occupational History  . Not on file  Tobacco Use  . Smoking status: Never Smoker  . Smokeless tobacco: Never Used  Substance and Sexual Activity  . Alcohol use: No  . Drug use: No  . Sexual activity: Not on file  Other Topics Concern  . Not on file  Social History Narrative  . Not on file    No Known Allergies  Current Facility-Administered Medications  Medication Dose Route Frequency Provider Last Rate Last Dose  . 0.9 %  sodium chloride infusion   Intra-arterial PRN Omar Person, NP      . aspirin chewable tablet 81 mg  81 mg Per Tube Daily Skeet Latch, MD   81 mg at 05/07/17 0939  . chlorhexidine  gluconate (MEDLINE KIT) (PERIDEX) 0.12 % solution 15 mL  15 mL Mouth Rinse BID Sampson Goon, MD   15 mL at 05/07/17 0748  . dexmedetomidine (PRECEDEX) 400 MCG/100ML (4 mcg/mL) infusion  0-2.8 mcg/kg/hr Intravenous Titrated Collene Gobble, MD 44.7 mL/hr at 05/07/17 1533 2 mcg/kg/hr at 05/07/17 1533  . DOBUTamine (DOBUTREX) infusion 4000 mcg/mL  2.5-20 mcg/kg/min Intravenous Titrated Bensimhon, Shaune Pascal, MD 12.1 mL/hr at 05/07/17 1547 9 mcg/kg/min at 05/07/17 1547  . docusate (COLACE) 50 MG/5ML liquid 100 mg  100 mg Per Tube BID PRN Scatliffe, Kristen D, MD      . feeding supplement (VITAL AF 1.2 CAL) liquid 1,000 mL  1,000 mL Per Tube Continuous Collene Gobble, MD 75 mL/hr at 05/07/17 0242 1,000 mL at 05/07/17 0242  . fentaNYL (SUBLIMAZE) bolus via infusion 25 mcg  25 mcg Intravenous Q1H PRN Scatliffe, Rise Paganini, MD   25 mcg at 05/04/17 0559  . fentaNYL 2582mg in NS 2578m(1035mml) infusion-PREMIX  0-100 mcg/hr Intravenous Continuous Diallo, Abdoulaye, MD 10 mL/hr at 05/07/17 0623 100 mcg/hr at 05/07/17 0623  . free water 100 mL  100 mL Per Tube Q6H EubOmar PersonP   100 mL at 05/07/17 1548  . furosemide (LASIX) injection 80 mg  80 mg  Intravenous Q12H Skeet Latch, MD   80 mg at 05/07/17 0756  . heparin ADULT infusion 100 units/mL (25000 units/229m sodium chloride 0.45%)  1,350 Units/hr Intravenous Continuous MWynell Balloon RPH 13.5 mL/hr at 05/07/17 0932 1,350 Units/hr at 05/07/17 0932  . heparin injection 1,000-6,000 Units  1,000-6,000 Units CRRT PRN CDonato Heinz MD   1,200 Units at 05/07/17 1134  . heparinized saline (2000 units/L) primer fluid for CRRT   CRRT PRN CDonato Heinz MD      . insulin aspart (novoLOG) injection 2-6 Units  2-6 Units Subcutaneous Q4H MWynell Balloon RPH   4 Units at 05/07/17 1545  . ipratropium-albuterol (DUONEB) 0.5-2.5 (3) MG/3ML nebulizer solution 3 mL  3 mL Nebulization Q6H GSampson Goon MD   3 mL at 05/07/17 1436  . MEDLINE  mouth rinse  15 mL Mouth Rinse QID GSampson Goon MD   15 mL at 05/07/17 1534  . meropenem (MERREM) 500 mg in sodium chloride 0.9 % 100 mL IVPB  500 mg Intravenous Q24H Sinclair, Emily S, RPH      . midazolam (VERSED) injection 1-2 mg  1-2 mg Intravenous Q2H PRN RLucila MaineC, DO   2 mg at 05/07/17 1531  . norepinephrine (LEVOPHED) 16 mg in dextrose 5 % 250 mL (0.064 mg/mL) infusion  0-40 mcg/min Intravenous Titrated BCollene Gobble MD   Stopped at 05/06/17 2214  . pantoprazole (PROTONIX) injection 40 mg  40 mg Intravenous QHS Scatliffe, KRise Paganini MD   40 mg at 05/06/17 2205  . prismasol BGK 4/2.5 5,000 mL dialysis replacement fluid   CRRT Continuous CDonato Heinz MD 500 mL/hr at 05/07/17 0027    . prismasol BGK 4/2.5 5,000 mL dialysis replacement fluid   CRRT Continuous CDonato Heinz MD 300 mL/hr at 05/07/17 0729    . prismasol BGK 4/2.5 5,000 mL dialysis solution   CRRT Continuous CDonato Heinz MD 1,000 mL/hr at 05/07/17 1018    . silver sulfADIAZINE (SILVADENE) 1 % cream   Topical Daily Byrum, RRose Fillers MD      . sodium chloride HYPERTONIC 3 % nebulizer solution 4 mL  4 mL Nebulization TID Diallo, Abdoulaye, MD      . thiamine (B-1) injection 100 mg  100 mg Intravenous Daily GSampson Goon MD   100 mg at 05/07/17 0939  . vancomycin (VANCOCIN) IVPB 1000 mg/200 mL premix  1,000 mg Intravenous Q24H RLucila MaineC, DO   Stopped at 05/06/17 2305    REVIEW OF SYSTEMS: Unable to obtain   PHYSICAL EXAM:   Vitals:   05/07/17 1300 05/07/17 1400 05/07/17 1500 05/07/17 1523  BP: 128/86 134/86 128/73   Pulse: (!) 102 100 (!) 107   Resp: (!) 21 19 (!) 22   Temp:    97.7 F (36.5 C)  TempSrc:    Oral  SpO2: 100% 100% 100%   Weight:      Height:        GENERAL: The patient is a well-nourished male, in no acute distress. The vital signs are documented above. CARDIAC: There is a regular rate and rhythm.  VASCULAR: Unable to assess for carotid bruits as he has catheters  in both sides of his neck. On the right side he has a palpable femoral and popliteal pulse.  He has a fairly brisk anterior tibial, peroneal, and dorsalis pedis signal on the right with the Doppler. On the left side he has a palpable femoral pulses are diminished popliteal pulse.  He has  a monophasic posterior tibial signal on the left only. He has bilateral lower extremity swelling. PULMONARY: There is good air exchange bilaterally without wheezing or rales. ABDOMEN: Soft and non-tender with normal pitched bowel sounds.  MUSCULOSKELETAL: There are no major deformities or cyanosis. NEUROLOGIC: Unable to assess as he is sedated. SKIN: He has extensive wounds on both feet.  On the left foot the entire dorsum of the foot has skin sloughing.  On the right foot there is sloughing distally on the dorsum of the foot and also some on the lateral aspect of the foot.  DATA:    BILATERAL LOWER EXTREMITY VENOUS DUPLEX: I reviewed his venous duplex scan that was done on 05/02/2017.  This showed evidence of acute DVT in the right posterior tibial vein and peroneal veins.  On the left side he had DVT involving the femoral vein, popliteal vein and posterior tibial veins and peroneal veins.  MEDICAL ISSUES:   BILATERAL LOWER EXTREMITY DVT'S: He is currently on intravenous heparin.  Once he is recovered from this acute event we will have to consider long-term anticoagulation depending upon his social situation.  For now would continue some mild leg elevation.  FROSTBITE BOTH FEET: He has reasonable Doppler signals in the right foot so I think the circulation there is adequate.  He will need aggressive wound care.  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  Currently he is not stable enough for an arteriogram.  He has chronic kidney disease with a GFR of 25 and a creatinine of 2.85.  If he stabilizes and his renal function  improves we could potentially consider an arteriogram in the future.  However for now I would simply continue aggressive wound care.  Currently he is not on any pressors.  If he stabilizes, and his renal function improves, we could consider arteriography next week.  Not much to add from a vascular standpoint at this point.  I will be away the remainder of the week and will check back next Monday.  If there are any questions before then the doctor on call can be reached at [phone].  Deitra Mayo [facility] [phone]

## 2017-05-07 NOTE — Procedures (Signed)
Admit: 05/01/2017 LOS: 6  76M with AKI on CRRT after likely massive PE, shock, bacteremia  Current CRRT Prescription: Start Date: 05/04/17 Catheter: TEMP R IJ placed 2/15 CCM BFR: 200 Pre Blood Pump: 500 4K DFR: 1000 4K Replacement Rate: 300 4K Goal UF: 185m hr net neg Anticoagulation: systemic heparin Clotting: infrequent  S: 2.1 L urine output yesterday, 3.4 L removed with ultrafiltration, net -1.2 L. K4.4, bicarbonate 23, phosphorus 3.8, hemoglobin 9.4 Currently on dobutamine, off of norepinephrine  O: 02/18 0701 - 02/19 0700 In: 41093[I.V.:1970; NG/GT:2100; IV Piggyback:300] Out: 5579.6 [Urine:2150]  Filed Weights   05/05/17 0432 05/06/17 0500 05/07/17 0418  Weight: 95.1 kg (209 lb 10.5 oz) 90.2 kg (198 lb 13.7 oz) 84.7 kg (186 lb 11.7 oz)    Recent Labs  Lab 05/06/17 0516 05/06/17 1831 05/07/17 0334  NA 138 137 137  K 4.7 4.5 4.4  CL 103 103 103  CO2 _0 GLUCOSE 158* 130* 149*  BUN 48* 45* 42*  CREATININE 3.54* 3.21* 2.85*  CALCIUM 7.4* 7.6* 7.7*  PHOS 3.8 3.8 3.8   Recent Labs  Lab 05/01/17 1110  05/05/17 0416 05/06/17 0516 05/07/17 0334  WBC 15.6*   < > 18.9* 21.2* 23.4*  NEUTROABS 13.0*  --   --   --   --   HGB 13.1   < > 9.0* 8.8* 9.4*  HCT 41.3   < > 27.1* 27.6* 29.3*  MCV 92.0   < > 88.3 89.3 90.4  PLT 181   < > 117* 106* 95*   < > = values in this interval not displayed.    Scheduled Meds: . aspirin  81 mg Per Tube Daily  . chlorhexidine gluconate (MEDLINE KIT)  15 mL Mouth Rinse BID  . free water  100 mL Per Tube Q6H  . furosemide  80 mg Intravenous Q12H  . insulin aspart  2-6 Units Subcutaneous Q4H  . ipratropium-albuterol  3 mL Nebulization Q6H  . mouth rinse  15 mL Mouth Rinse QID  . pantoprazole (PROTONIX) IV  40 mg Intravenous QHS  . silver sulfADIAZINE   Topical Daily  . thiamine injection  100 mg Intravenous Daily   Continuous Infusions: . sodium chloride    . dexmedetomidine (PRECEDEX) IV infusion 2 mcg/kg/hr  (05/07/17 0729)  . DOBUTamine 9 mcg/kg/min (05/07/17 0727)  . feeding supplement (VITAL AF 1.2 CAL) 1,000 mL (05/07/17 0242)  . fentaNYL infusion INTRAVENOUS 100 mcg/hr (05/07/17 02355  . heparin 1,350 Units/hr (05/06/17 1610)  . heparin    . meropenem (MERREM) IV Stopped (05/06/17 1410)  . norepinephrine (LEVOPHED) Adult infusion Stopped (05/06/17 2214)  . dialysis replacement fluid (prismasate) 500 mL/hr at 05/07/17 0027  . dialysis replacement fluid (prismasate) 300 mL/hr at 05/07/17 0729  . dialysate (PRISMASATE) 1,000 mL/hr at 05/07/17 0532  . vancomycin Stopped (05/06/17 2305)   PRN Meds:.Place/Maintain arterial line **AND** sodium chloride, docusate, fentaNYL, heparin, heparin, midazolam  ABG    Component Value Date/Time   PHART 7.336 (L) 05/03/2017 0326   PCO2ART 38.0 05/03/2017 0326   PO2ART 114 (H) 05/03/2017 0326   HCO3 19.8 (L) 05/03/2017 0326   TCO2 23 05/02/2017 0419   ACIDBASEDEF 5.0 (H) 05/03/2017 0326   O2SAT 48.9 05/07/2017 0000    A/P  1. 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 B/l DVT and presumed PE on heparin 4. RV  Failure on dobutamine 5. Anemia 6. VDRF 7. Homelessness 8. Sepsis with UTI and Acinetobacter bacteremia vanc/zosyn  Stop CRRT.  Monitor for recovery renal function, likely would tolerate gentle intermittent dialysis moving forward.      Pearson Grippe, MD [facility] pgr [phone]

## 2017-05-07 NOTE — Progress Notes (Signed)
PHARMACY NOTE:  ANTIMICROBIAL RENAL DOSAGE ADJUSTMENT  Current antimicrobial regimen includes a mismatch between antimicrobial dosage and estimated renal function.  As per policy approved by the Pharmacy & Therapeutics and Medical Executive Committees, the antimicrobial dosage will be adjusted accordingly.  Current antimicrobial dosage: Meropenem 1 gm every 12 hours   Indication: Bacteremia  Renal Function:  Estimated Creatinine Clearance: 26.3 mL/min (A) (by C-G formula based on SCr of 2.85 mg/dL (H)). []      On intermittent HD, scheduled: []      On CRRT    Antimicrobial dosage has been changed to:  Meropenem 500 mg every 24 hours   Additional comments: CRRT stopped at 11:30 AM. Will adjust meropenem back to 500 mg every 24 hours for estimated CrCl < 10 ml/min.    Thank you for allowing pharmacy to be a part of this patient's care.  Sharin MonsEmily Sinclair, PharmD, BCPS PGY2 Infectious Diseases Pharmacy Resident Pager: [phone]  05/07/2017 1:06 PM

## 2017-05-07 NOTE — H&P (View-Only) (Signed)
Patient name: Joseph Downs MRN: [id number] DOB: 24-Oct-1950 Sex: male   REASON FOR CONSULT:    Frostbite of both feet with no Doppler signals.  Consult is requested by critical care medicine.  HPI:   Joseph Downs is a pleasant 67 y.o. male, who was reportedly in a homeless man who was found down for 13 hours with hypothermia and lactic acidosis.  He was found to have bilateral lower extremity DVTs and had frostbite of both feet.  He did not have Doppler signals in his feet and therefore vascular surgery was consulted.  Of note, the patient is intubated and sedated I am unable to obtain any history from the patient.  There is no family currently available.  No past medical history on file.  Unable to obtain  Family History  Problem Relation Age of Onset  . Cancer Father   Unable to obtain  SOCIAL HISTORY: Social History   Socioeconomic History  . Marital status: Married    Spouse name: Not on file  . Number of children: Not on file  . Years of education: Not on file  . Highest education level: Not on file  Social Needs  . Financial resource strain: Not on file  . Food insecurity - worry: Not on file  . Food insecurity - inability: Not on file  . Transportation needs - medical: Not on file  . Transportation needs - non-medical: Not on file  Occupational History  . Not on file  Tobacco Use  . Smoking status: Never Smoker  . Smokeless tobacco: Never Used  Substance and Sexual Activity  . Alcohol use: No  . Drug use: No  . Sexual activity: Not on file  Other Topics Concern  . Not on file  Social History Narrative  . Not on file    No Known Allergies  Current Facility-Administered Medications  Medication Dose Route Frequency Provider Last Rate Last Dose  . 0.9 %  sodium chloride infusion   Intra-arterial PRN Omar Person, NP      . aspirin chewable tablet 81 mg  81 mg Per Tube Daily Skeet Latch, MD   81 mg at 05/07/17 0939  . chlorhexidine  gluconate (MEDLINE KIT) (PERIDEX) 0.12 % solution 15 mL  15 mL Mouth Rinse BID Sampson Goon, MD   15 mL at 05/07/17 0748  . dexmedetomidine (PRECEDEX) 400 MCG/100ML (4 mcg/mL) infusion  0-2.8 mcg/kg/hr Intravenous Titrated Collene Gobble, MD 44.7 mL/hr at 05/07/17 1533 2 mcg/kg/hr at 05/07/17 1533  . DOBUTamine (DOBUTREX) infusion 4000 mcg/mL  2.5-20 mcg/kg/min Intravenous Titrated Bensimhon, Shaune Pascal, MD 12.1 mL/hr at 05/07/17 1547 9 mcg/kg/min at 05/07/17 1547  . docusate (COLACE) 50 MG/5ML liquid 100 mg  100 mg Per Tube BID PRN Scatliffe, Kristen D, MD      . feeding supplement (VITAL AF 1.2 CAL) liquid 1,000 mL  1,000 mL Per Tube Continuous Collene Gobble, MD 75 mL/hr at 05/07/17 0242 1,000 mL at 05/07/17 0242  . fentaNYL (SUBLIMAZE) bolus via infusion 25 mcg  25 mcg Intravenous Q1H PRN Scatliffe, Rise Paganini, MD   25 mcg at 05/04/17 0559  . fentaNYL 2582mg in NS 2578m(1035mml) infusion-PREMIX  0-100 mcg/hr Intravenous Continuous Diallo, Abdoulaye, MD 10 mL/hr at 05/07/17 0623 100 mcg/hr at 05/07/17 0623  . free water 100 mL  100 mL Per Tube Q6H EubOmar PersonP   100 mL at 05/07/17 1548  . furosemide (LASIX) injection 80 mg  80 mg  Intravenous Q12H Skeet Latch, MD   80 mg at 05/07/17 0756  . heparin ADULT infusion 100 units/mL (25000 units/261m sodium chloride 0.45%)  1,350 Units/hr Intravenous Continuous MWynell Balloon RPH 13.5 mL/hr at 05/07/17 0932 1,350 Units/hr at 05/07/17 0932  . heparin injection 1,000-6,000 Units  1,000-6,000 Units CRRT PRN CDonato Heinz MD   1,200 Units at 05/07/17 1134  . heparinized saline (2000 units/L) primer fluid for CRRT   CRRT PRN CDonato Heinz MD      . insulin aspart (novoLOG) injection 2-6 Units  2-6 Units Subcutaneous Q4H MWynell Balloon RPH   4 Units at 05/07/17 1545  . ipratropium-albuterol (DUONEB) 0.5-2.5 (3) MG/3ML nebulizer solution 3 mL  3 mL Nebulization Q6H GSampson Goon MD   3 mL at 05/07/17 1436  . MEDLINE  mouth rinse  15 mL Mouth Rinse QID GSampson Goon MD   15 mL at 05/07/17 1534  . meropenem (MERREM) 500 mg in sodium chloride 0.9 % 100 mL IVPB  500 mg Intravenous Q24H Sinclair, Emily S, RPH      . midazolam (VERSED) injection 1-2 mg  1-2 mg Intravenous Q2H PRN RLucila MaineC, DO   2 mg at 05/07/17 1531  . norepinephrine (LEVOPHED) 16 mg in dextrose 5 % 250 mL (0.064 mg/mL) infusion  0-40 mcg/min Intravenous Titrated BCollene Gobble MD   Stopped at 05/06/17 2214  . pantoprazole (PROTONIX) injection 40 mg  40 mg Intravenous QHS Scatliffe, KRise Paganini MD   40 mg at 05/06/17 2205  . prismasol BGK 4/2.5 5,000 mL dialysis replacement fluid   CRRT Continuous CDonato Heinz MD 500 mL/hr at 05/07/17 0027    . prismasol BGK 4/2.5 5,000 mL dialysis replacement fluid   CRRT Continuous CDonato Heinz MD 300 mL/hr at 05/07/17 0729    . prismasol BGK 4/2.5 5,000 mL dialysis solution   CRRT Continuous CDonato Heinz MD 1,000 mL/hr at 05/07/17 1018    . silver sulfADIAZINE (SILVADENE) 1 % cream   Topical Daily Byrum, RRose Fillers MD      . sodium chloride HYPERTONIC 3 % nebulizer solution 4 mL  4 mL Nebulization TID Diallo, Abdoulaye, MD      . thiamine (B-1) injection 100 mg  100 mg Intravenous Daily GSampson Goon MD   100 mg at 05/07/17 0939  . vancomycin (VANCOCIN) IVPB 1000 mg/200 mL premix  1,000 mg Intravenous Q24H RLucila MaineC, DO   Stopped at 05/06/17 2305    REVIEW OF SYSTEMS: Unable to obtain   PHYSICAL EXAM:   Vitals:   05/07/17 1300 05/07/17 1400 05/07/17 1500 05/07/17 1523  BP: 128/86 134/86 128/73   Pulse: (!) 102 100 (!) 107   Resp: (!) 21 19 (!) 22   Temp:    97.7 F (36.5 C)  TempSrc:    Oral  SpO2: 100% 100% 100%   Weight:      Height:        GENERAL: The patient is a well-nourished male, in no acute distress. The vital signs are documented above. CARDIAC: There is a regular rate and rhythm.  VASCULAR: Unable to assess for carotid bruits as he has catheters  in both sides of his neck. On the right side he has a palpable femoral and popliteal pulse.  He has a fairly brisk anterior tibial, peroneal, and dorsalis pedis signal on the right with the Doppler. On the left side he has a palpable femoral pulses are diminished popliteal pulse.  He has  a monophasic posterior tibial signal on the left only. He has bilateral lower extremity swelling. PULMONARY: There is good air exchange bilaterally without wheezing or rales. ABDOMEN: Soft and non-tender with normal pitched bowel sounds.  MUSCULOSKELETAL: There are no major deformities or cyanosis. NEUROLOGIC: Unable to assess as he is sedated. SKIN: He has extensive wounds on both feet.  On the left foot the entire dorsum of the foot has skin sloughing.  On the right foot there is sloughing distally on the dorsum of the foot and also some on the lateral aspect of the foot.  DATA:    BILATERAL LOWER EXTREMITY VENOUS DUPLEX: I reviewed his venous duplex scan that was done on 05/02/2017.  This showed evidence of acute DVT in the right posterior tibial vein and peroneal veins.  On the left side he had DVT involving the femoral vein, popliteal vein and posterior tibial veins and peroneal veins.  MEDICAL ISSUES:   BILATERAL LOWER EXTREMITY DVT'S: He is currently on intravenous heparin.  Once he is recovered from this acute event we will have to consider long-term anticoagulation depending upon his social situation.  For now would continue some mild leg elevation.  FROSTBITE BOTH FEET: He has reasonable Doppler signals in the right foot so I think the circulation there is adequate.  He will need aggressive wound care.  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  Currently he is not stable enough for an arteriogram.  He has chronic kidney disease with a GFR of 25 and a creatinine of 2.85.  If he stabilizes and his renal function  improves we could potentially consider an arteriogram in the future.  However for now I would simply continue aggressive wound care.  Currently he is not on any pressors.  If he stabilizes, and his renal function improves, we could consider arteriography next week.  Not much to add from a vascular standpoint at this point.  I will be away the remainder of the week and will check back next Monday.  If there are any questions before then the doctor on call can be reached at [phone].  Deitra Mayo [facility] [phone]

## 2017-05-07 NOTE — Progress Notes (Signed)
s PULMONARY / CRITICAL CARE MEDICINE   Name: Joseph Downs MRN: [id number] DOB: 1950/06/15    ADMISSION DATE:  05/01/2017 CONSULTATION DATE:  05/01/17  REFERRING MD:  Dr. Deretha Emory  CHIEF COMPLAINT:  Found down  HISTORY OF PRESENT ILLNESS:        This is a 67 year old man who was found down in 40 degree weather. He is homeless. He was tachypneic and hypotensive as well as hypothermic on arrival with a lactic acid in excess of 7.  He received 3.5 L of crystalloid resuscitation along with vancomycin and Zosyn and was warmed with a bear hugger. He was alert and able to communicate in ED providing the following history: "He tells me that he had a syncopal episode last night but is a little hazy on the exact details.  He is not aware that he had any palpitations preceding that episode and he denies a prior history of seizure disorder.  He denies any new pains suggestive of an unprotected fall.  He denies use of street drugs or alcohol." Critical care service is asked to admit the patient because he still has a lactate acid in excess of 6 after being warmed and fluid resuscitated. Patient was initiated on bipap but would not tolerate mask. He was intubated shortly after admission.   SUBJECTIVE:  No acute changes. Co-ox worsened, levophed and dobutamine on   VITAL SIGNS: BP 125/82   Pulse 85   Temp (!) 97.4 F (36.3 C) (Oral)   Resp 20   Ht 5\' 10"  (1.778 m)   Wt 186 lb 11.7 oz (84.7 kg)   SpO2 99%   BMI 26.79 kg/m   HEMODYNAMICS: CVP:  [2 mmHg-12 mmHg] 7 mmHg  VENTILATOR SETTINGS: Vent Mode: PRVC FiO2 (%):  [30 %] 30 % Set Rate:  [18 bmp] 18 bmp Vt Set:  [580 mL] 580 mL PEEP:  [5 cmH20] 5 cmH20 Plateau Pressure:  [16 cmH20] 16 cmH20  INTAKE / OUTPUT: I/O last 3 completed shifts: In: 6656.1 [I.V.:2856.1; NG/GT:3200; IV Piggyback:600] Out: 8079.6 [Urine:3740; Other:4339.6]  PHYSICAL EXAMINATION: General: Ill-appearing man, lying in bed, sedated, intubated Neuro: sedated,  intubated HEENT: ET tube in place, no oral lesions Cardiovascular: Regular, no murmur Lungs: Coarse bilateral breath sounds Abdomen: Soft, benign, positive bowel sounds Musculoskeletal: Bilateral lower extremity pitting edema, feet wrapped bilaterally  Skin: in tact  LABS:  BMET Recent Labs  Lab 05/06/17 0516 05/06/17 1831 05/07/17 0334  NA 138 137 137  K 4.7 4.5 4.4  CL 103 103 103  CO2 22 23 23   BUN 48* 45* 42*  CREATININE 3.54* 3.21* 2.85*  GLUCOSE 158* 130* 149*    Electrolytes Recent Labs  Lab 05/05/17 0416  05/06/17 0516 05/06/17 1831 05/07/17 0334  CALCIUM 7.0*   < > 7.4* 7.6* 7.7*  MG 2.3  --  2.2  --  2.4  PHOS 4.5   < > 3.8 3.8 3.8   < > = values in this interval not displayed.    CBC Recent Labs  Lab 05/05/17 0416 05/06/17 0516 05/07/17 0334  WBC 18.9* 21.2* 23.4*  HGB 9.0* 8.8* 9.4*  HCT 27.1* 27.6* 29.3*  PLT 117* 106* 95*    Coag's No results for input(s): APTT, INR in the last 168 hours.  Sepsis Markers Recent Labs  Lab 05/01/17 1954 05/02/17 0237 05/02/17 1049 05/02/17 1835 05/03/17 0328  LATICACIDVEN 5.9*  --  1.5 1.9 1.2  PROCALCITON 14.36 19.69  --   --  22.90  ABG Recent Labs  Lab 05/01/17 2119 05/02/17 0419 05/03/17 0326  PHART 7.436 7.260* 7.336*  PCO2ART 21.6* 47.2 38.0  PO2ART 55.0* 349.0* 114*    Liver Enzymes Recent Labs  Lab 05/03/17 0328  05/06/17 0516 05/06/17 1831 05/07/17 0334  AST 896*  --  239*  --  177*  ALT 716*  --  378*  --  326*  ALKPHOS 72  --  109  --  115  BILITOT 0.7  --  0.7  --  0.6  ALBUMIN 1.7*   < > 1.7*  1.7* 1.7* 1.7*  1.6*   < > = values in this interval not displayed.    Cardiac Enzymes Recent Labs  Lab 05/02/17 1049 05/02/17 1835 05/03/17 0328  TROPONINI 5.65* 3.84* 2.29*    Glucose Recent Labs  Lab 05/06/17 0722 05/06/17 1138 05/06/17 1522 05/06/17 2028 05/06/17 2342 05/07/17 0339  GLUCAP 149* 141* 123* 133* 122* 140*    Imaging No results  found.  STUDIES:  CXR 2/13 - no acute cardiopulmonary abnormality ECHO 2/14 - EF 65-70%, G1DD, RV strain  CULTURES: MRSA PCR negative Ucx + staph lugdenensis Blood cx 2/13 >> Diphtheroids + Acinetobacter lwoffii  ANTIBIOTICS: Zosyn 2/13> 2/18 Vanc 2/13 >> Meropenem 2/18 >>>  SIGNIFICANT EVENTS: 2/13 admitted, intubated  LINES/TUBES: ETT 2/13 >>  PIV Foley 2/13 OGT 2/13 Right IJ HD catheter 2/15 >> Left IJ CVC 2/15 >>   DISCUSSION: 67 year old man found poorly responsive outside with evidence for shock and hypothermia.  He was intubated for encephalopathy and hemodynamic instability.  He had a lactic acidosis and acute renal failure, positive troponin.  Evaluation has revealed bilateral lower extremity DVT, right heart strain by echocardiogram, probable PE.  He has polymicrobial bacteremia, no evidence of valvular disease by echo, not yet speciated.  Also with Staph lugdunensis UTI (often associated with prostate cancer). PSA elevated. Progressive oliguric renal failure, now CVVHD. Acinetobacter growing in blood along w/ diphtheroids.   ASSESSMENT / PLAN:  PULMONARY A: Acute respiratory failure Probable pulmonary embolism with associated right heart strain P:   Continue current ventilator support as we stabilize hemodynamics, metabolic status, renal function. Continue heparin infusion Continue scheduled bronchodilators  CARDIOVASCULAR A:  Stress related non-ST elevation MI, suspect right heart strain Cor pulmonale Cardiogenic shock P:  Appreciate cardiology input Currently on dobutamine 7.5, levo 22 Check Co-ox q8 hours Gentle Volume removal per CVVHD He will likely ultimately require an ischemic evaluation  RENAL A:   Acute oliguric renal failure- on CRRT Hyperkalemia- resolved Lactic acidosis- resolved Rhabdomyolysis-improving P:   Nephrology following, appreciate recommendations Will monitor off CRRT for renal recovery Follow BMP, urine output Follow  CK  GASTROINTESTINAL A:   NPO Elevated LFTs, suspect shock liver P:   Continue PPI Continue tube feeding Follow LFTs  HEMATOLOGIC / ONC A:   Bilateral DVTs Leukocytosis Anemia Elevated PSA, Staph lugdunensis both suggestive of possible prostatic CA Thrombocytopenia- 106 > 95 P:  Continue heparin drip Follow CBC Transfusion threshold < 7 He will need further evaluation for possible prostate cancer once he stabilizes from his critical illness  INFECTIOUS A:   RLL infiltrate on CXR UTI-  >100,000 colonies staph lugdenenis, sensitivities pending Acinetobacter lwoffii bacteremia P:   Continue vancomycin continue meropenem  Wound care for his bilateral foot wounds Trend WBC, fevers Repeat blood culture today  GU A:  urinary retention Elevated PSA P: Foley placed He will need an evaluation for prostate cancer once stabilized  ENDOCRINE A:   hyperglycemia  P:   Follow CBG Sliding scale insulin per protocol  WOUND CARE A: Bilateral feet with evidence for skin sloughing versus debridement. P: Continue current dressing changes  NEUROLOGIC A:   Toxic metabolic encephalopathy Sedation for ventilator tolerance P:   RASS goal: -1 Continue fentanyl, Precedex Versed if needed Wean sedation as we are able  FAMILY  - Updates: no family at bedside - Inter-disciplinary family meet or Palliative Care meeting due by:  2/20   Dolores Patty, DO PGY-2, [facility] 05/07/2017 7:45 AM

## 2017-05-07 NOTE — Progress Notes (Signed)
ANTICOAGULATION CONSULT NOTE - Follow- Up Consult  Pharmacy Consult for Heparin  Indication: chest pain/ACS / new BL DVTs/possible PE  No Known Allergies  Patient Measurements: Height: 5\' 10"  (177.8 cm) Weight: 186 lb 11.7 oz (84.7 kg) IBW/kg (Calculated) : 73  Vital Signs: Temp: 97.4 F (36.3 C) (02/19 0340) Temp Source: Oral (02/19 0340) BP: 125/82 (02/19 0700) Pulse Rate: 85 (02/19 0700)  Labs: Recent Labs    05/05/17 0416  05/06/17 0516 05/06/17 1831 05/07/17 0334  HGB 9.0*  --  8.8*  --  9.4*  HCT 27.1*  --  27.6*  --  29.3*  PLT 117*  --  106*  --  95*  HEPARINUNFRC 0.62  --  0.52  --  0.65  CREATININE 5.04*   < > 3.54* 3.21* 2.85*  CKTOTAL 3,198*  --  2,623*  --   --    < > = values in this interval not displayed.    Estimated Creatinine Clearance: 26.3 mL/min (A) (by C-G formula based on SCr of 2.85 mg/dL (H)).  Assessment: [name] in the ICU after being "found down".Patient has been found to have bilateral lower extremity with concern for possible PE.  Heparin level today remains therapeutic at 0.65 on 1350 units/hr. H/H is stable. Platelets are down slightly. No signs of bleeding have been noted.    Goal of Therapy:  Heparin level 0.3-0.7 units/ml Monitor platelets by anticoagulation protocol: Yes   Plan:  Continue heparin at 1350 units/hr Daily heparin level and CBC Follow for long term plans for anticoagulation  Sharin MonsEmily Sinclair, PharmD, BCPS PGY2 Infectious Diseases Pharmacy Resident Pager: [phone]  05/07/2017 7:49 AM

## 2017-05-07 NOTE — Progress Notes (Addendum)
Independently examined pt, evaluated data & formulated above care plan with NP/resident   67 year old homeless man found down/13 with hypothermia, shock lactic acidosis. Found to have bilateral DVTs and evidence of RV strain on echo He was also noted to have Acinetobacter in blood and staph in urine with elevated PSA  On exam-chronically ill-appearing, agitated when sedation weaned, dry gangrene of both feet, decreased breath sounds bilateral, tachycardic on dobutamine.  Chest x-ray personally reviewed by me shows bilateral airspace disease and effusions Labs show improving creatinine on hemodialysis, low central venous saturation, persistent leukocytosis and mild anemia and thrombus cytopenia.  Impression/plan  Acute respiratory failure-continue weaning attempts on pressure support/CPAP  Bilateral DVT/presumed massive PE with RV strain-hypotension now resolved, continue IV heparin, dobutamine being titrated by heart failure service but not sure this is of any benefit low threshold to place IVC filter since doubt he will be compliant with anticoagulation in the future  Acute encephalopathy-continue Precedex and fentanyl drips. if needed will add low-dose Seroquel and Klonopin  Acinetobacter sepsis-not clear that this is true bug versus contaminant, can simplify antibiotics to Unasyn eventually once clinical improvement May need vascular to assess feet gangrene due to frost bite, unable to do ABI  The patient is critically ill with multiple organ systems failure and requires high complexity decision making for assessment and support, frequent evaluation and titration of therapies, application of advanced monitoring technologies and extensive interpretation of multiple databases. Critical Care Time devoted to patient care services described in this note independent of APP/resident  time is 35 minutes.    Comer Locketakesh V. Vassie LollAlva MD

## 2017-05-07 NOTE — Consult Note (Addendum)
Advanced Heart Failure Team Consult Note   Primary Physician: Patient, No Pcp Per PCP-Cardiologist:  Skeet Latch, MD  Reason for Consultation: RV failure  HPI:    AHF team consulted by Dr. Oval Linsey to assist with management of cardiogenic shock  Mr. Yaworski is a 67 y/o homeless male with known past medical history admitted on 2/13 after being found down and hypothermic. On arrival to ER was able to communicate despite lactate > 7. Reported recent syncopal episode.   He was initially hypotensive and tachycardic and had a leukocytosis. CXR concerning for CAP. Initial Troponin was elevated to 2.8. Peaked at 5.6  ECG non-acute.He was improving and then became responsive and subsequently developed hypoxic respiratory failure requiring intubation. He was found to have extensive lower extremity DVTs and presumptively PE (no CT due to AKI).  Echo LVEF 65-70% with RV failure and McConnell's sign. Hospitalization complicated by rhabdo, AKI and oliguria with SCr up to 7.  Urine culture positive for Staph lugdeunensis.BCx + ACINETOBACTER LWOFFII.   Remains intubated and sedated. On CVVHD. Co-ox dropped on NE. Now back on dobutamine at 7.5. Co-ox improved. SBP 110. Weight down 12 pounds overnight  CVP 7-8  Review of Systems: Unavailable as he is intubated   Home Medications Prior to Admission medications   Medication Sig Start Date End Date Taking? Authorizing Provider  finasteride (PROSCAR) 5 MG tablet Take 5 mg by mouth daily.   Yes [provider]  tamsulosin (FLOMAX) 0.4 MG CAPS capsule Take 1 capsule (0.4 mg total) by mouth daily after supper. Patient not taking: Reported on 05/01/2017 03/26/17   Barton Dubois, MD   Inpatient meds:  . aspirin  81 mg Per Tube Daily  . chlorhexidine gluconate (MEDLINE KIT)  15 mL Mouth Rinse BID  . free water  100 mL Per Tube Q6H  . furosemide  80 mg Intravenous Q12H  . insulin aspart  2-6 Units Subcutaneous Q4H  . ipratropium-albuterol  3  mL Nebulization Q6H  . mouth rinse  15 mL Mouth Rinse QID  . pantoprazole (PROTONIX) IV  40 mg Intravenous QHS  . silver sulfADIAZINE   Topical Daily  . thiamine injection  100 mg Intravenous Daily    Past Medical History:  Not available. Nothing on file  Family History: Family History  Problem Relation Age of Onset  . Cancer Father     Social History: Social History   Socioeconomic History  . Marital status: Married    Spouse name: Not on file  . Number of children: Not on file  . Years of education: Not on file  . Highest education level: Not on file  Social Needs  . Financial resource strain: Not on file  . Food insecurity - worry: Not on file  . Food insecurity - inability: Not on file  . Transportation needs - medical: Not on file  . Transportation needs - non-medical: Not on file  Occupational History  . Not on file  Tobacco Use  . Smoking status: Never Smoker  . Smokeless tobacco: Never Used  Substance and Sexual Activity  . Alcohol use: No  . Drug use: No  . Sexual activity: Not on file  Other Topics Concern  . Not on file  Social History Narrative  . Not on file    Allergies:  No Known Allergies  Objective:    Vital Signs:   Temp:  [97.4 F (36.3 C)-100.3 F (37.9 C)] 97.4 F (36.3 C) (02/19 0340) Pulse Rate:  [  85-112] 85 (02/19 0500) Resp:  [17-28] 18 (02/19 0600) BP: (87-138)/(40-87) 120/82 (02/19 0600) SpO2:  [97 %-100 %] 99 % (02/19 0500) Arterial Line BP: (102-152)/(31-67) 148/67 (02/19 0600) FiO2 (%):  [30 %] 30 % (02/19 0312) Weight:  [84.7 kg (186 lb 11.7 oz)] 84.7 kg (186 lb 11.7 oz) (02/19 0418) Last BM Date: 05/06/17  Weight change: Filed Weights   05/05/17 0432 05/06/17 0500 05/07/17 0418  Weight: 95.1 kg (209 lb 10.5 oz) 90.2 kg (198 lb 13.7 oz) 84.7 kg (186 lb 11.7 oz)    Intake/Output:   Intake/Output Summary (Last 24 hours) at 05/07/2017 9937 Last data filed at 05/07/2017 0600 Gross per 24 hour  Intake 4357.97 ml    Output 5481.6 ml  Net -1123.63 ml      Physical Exam    CVP 7-8 General:  Sedated on ventdifficulty HEENT: normal Neck: supple. RIJ trialysis cath Carotids 2+ bilat; no bruits. No lymphadenopathy or thryomegaly appreciated. Cor: PMI nondisplaced. Regular rate & rhythm.2/6 TR Lungs: clear Abdomen: soft, nontender, nondistended. No hepatosplenomegaly. No bruits or masses. Good bowel sounds. Extremities: no cyanosis, clubbing, rash, 2+ edema  LE wrapped with full thickness skin loss on feet Neuro:intubated sedated    Telemetry    Sinus 80s Personally reviewed   EKG    2/14: NSR 80  No ST-T wave abnormalities. Personally reviewed   Labs   Basic Metabolic Panel: Recent Labs  Lab 05/03/17 0328  05/03/17 1915  05/05/17 0416 05/05/17 1929 05/06/17 0516 05/06/17 1831 05/07/17 0334  NA 148*   < > 150*   < > 141 133* 138 137 137  K 4.9   < > 4.2   < > 4.1 4.0 4.7 4.5 4.4  CL 114*   < > 114*   < > 105 101 103 103 103  CO2 18*   < > 20*   < > 22 21* 22 23 23   GLUCOSE 164*   < > 135*   < > 139* 176* 158* 130* 149*  BUN 96*   < > 100*   < > 73* 54* 48* 45* 42*  CREATININE 6.33*   < > 6.37*   < > 5.04* 3.64* 3.54* 3.21* 2.85*  CALCIUM 6.5*   < > 6.0*   < > 7.0* 7.0* 7.4* 7.6* 7.7*  MG 2.3  --  2.2  --  2.3  --  2.2  --  2.4  PHOS  --   --  6.8*   < > 4.5 4.3 3.8 3.8 3.8   < > = values in this interval not displayed.    Liver Function Tests: Recent Labs  Lab 05/02/17 1049 05/02/17 1835 05/03/17 0328  05/05/17 0416 05/05/17 1929 05/06/17 0516 05/06/17 1831 05/07/17 0334  AST 2,228* 1,471* 896*  --   --   --  239*  --  177*  ALT 957* 868* 716*  --   --   --  378*  --  326*  ALKPHOS 63 70 72  --   --   --  109  --  115  BILITOT 0.8 0.9 0.7  --   --   --  0.7  --  0.6  PROT 6.0* 5.8* 5.5*  --   --   --  6.1*  --  6.9  ALBUMIN 1.9* 1.9* 1.7*   < > 1.8* 1.7* 1.7*  1.7* 1.7* 1.7*  1.6*   < > = values in this interval not displayed.   No  results for input(s):  LIPASE, AMYLASE in the last 168 hours. No results for input(s): AMMONIA in the last 168 hours.  CBC: Recent Labs  Lab 05/01/17 1110  05/03/17 0328 05/04/17 0534 05/05/17 0416 05/06/17 0516 05/07/17 0334  WBC 15.6*   < > 20.8* 18.2* 18.9* 21.2* 23.4*  NEUTROABS 13.0*  --   --   --   --   --   --   HGB 13.1   < > 9.4* 8.4* 9.0* 8.8* 9.4*  HCT 41.3   < > 29.5* 26.1* 27.1* 27.6* 29.3*  MCV 92.0   < > 90.2 88.5 88.3 89.3 90.4  PLT 181   < > 112* 113* 117* 106* 95*   < > = values in this interval not displayed.    Cardiac Enzymes: Recent Labs  Lab 05/01/17 1954 05/02/17 0237 05/02/17 1049 05/02/17 1835 05/03/17 0328 05/04/17 0534 05/05/17 0416 05/06/17 0516  CKTOTAL 2,933*  --   --   --  4,620* 3,649* 3,198* 2,623*  TROPONINI 2.83* 5.30* 5.65* 3.84* 2.29*  --   --   --     BNP: BNP (last 3 results) Recent Labs    05/01/17 1954  BNP 208.4*    ProBNP (last 3 results) No results for input(s): PROBNP in the last 8760 hours.   CBG: Recent Labs  Lab 05/06/17 1138 05/06/17 1522 05/06/17 2028 05/06/17 2342 05/07/17 0339  GLUCAP 141* 123* 133* 122* 140*    Coagulation Studies: No results for input(s): LABPROT, INR in the last 72 hours.   Imaging   No results found.   Medications:     Current Medications: . aspirin  81 mg Per Tube Daily  . chlorhexidine gluconate (MEDLINE KIT)  15 mL Mouth Rinse BID  . free water  100 mL Per Tube Q6H  . furosemide  80 mg Intravenous Q12H  . insulin aspart  2-6 Units Subcutaneous Q4H  . ipratropium-albuterol  3 mL Nebulization Q6H  . mouth rinse  15 mL Mouth Rinse QID  . pantoprazole (PROTONIX) IV  40 mg Intravenous QHS  . silver sulfADIAZINE   Topical Daily  . thiamine injection  100 mg Intravenous Daily    Infusions: . sodium chloride    . dexmedetomidine (PRECEDEX) IV infusion 2 mcg/kg/hr (05/07/17 0500)  . DOBUTamine 9 mcg/kg/min (05/07/17 0622)  . feeding supplement (VITAL AF 1.2 CAL) 1,000 mL (05/07/17  0242)  . fentaNYL infusion INTRAVENOUS 100 mcg/hr (05/07/17 3662)  . heparin 1,350 Units/hr (05/06/17 1610)  . heparin    . meropenem (MERREM) IV Stopped (05/06/17 1410)  . norepinephrine (LEVOPHED) Adult infusion Stopped (05/06/17 2214)  . dialysis replacement fluid (prismasate) 500 mL/hr at 05/07/17 0027  . dialysis replacement fluid (prismasate) 300 mL/hr at 05/06/17 1413  . dialysate (PRISMASATE) 1,000 mL/hr at 05/07/17 0532  . vancomycin Stopped (05/06/17 2305)      Patient Profile    [phone] with homelessness and no known past medical history here after being found down and hypothermic.   He was initially hypotensive and tachycardic and had a leukocytosis. CXR concerning for CAP, urine culture positive for  Staph lugdeunensis.Troponin was elevated to 2.8. He was improving and then became responsive and subsequently developed hypoxic respiratory failure requiring intubation. He was found to have extensive lower extremity DVTs and presumptively PE (no CT due to AKI).  Echo positive for RV failure and McConnell's sign. Hospitalization complicated by AKI and oliguria.   Assessment/Plan   1. Acute RV failure/cor pulmonale - echo LVEF  65-70% with severe RV HK - likely massive PE (has extensive DVT on LE u/s). No CT with AKI - continue heparin. - co-ox dropped into 30s with weaning of dobutamine. Now on dobutamine 7.5 off NE. SBP ok. Co-ox back to 49%. Will increase dobutamine to 9. Will eventually need to wean - HR back into 80s which is reassuring and suggests improvement in RV strain. Will repeat echo later in the week.  - situation complicated by possible sepsis - volume overload much improved. Still with some edema but CVP 7-8. Would not be much more aggressive in lowering preload given RV failure. Consider running CVVHD even or stopping - may need to consider IVC filter given burden of LE clot - long-term prognosis is guarded  2. Shock - likely mixed cardiogenic (RV) and  septic - co-ox 49% - as above continue dobutamine   4. Elevated troponin - due to PE and RV strain. ECG ok no evidence of regional LV wall motionaabnormality to suggest ACS  5. Acute respiratory failure - due to #1. Also PNA on CXR - vent management per CCM now on 30% FiO2  6. AKI - due to shock and rhabdo on CVVHD. Also component of obstruction. No hydro on u/s. Renal function recovering - hopefully can stop CVVHD soon   7. Sepsis - Staph lugdunensis in urine (with elevated PSA (42) suggestive of possible prostate CA) - ACINETOBACTER LWOFFII in blood  - on abx per CCM  8. Frostbite with LE skin loss - WOC following  9. Shock liver - improving with support   10. Homelessness   CRITICAL CARE Performed by: Glori Bickers  Total critical care time: 35 minutes  Critical care time was exclusive of separately billable procedures and treating other patients.  Critical care was necessary to treat or prevent imminent or life-threatening deterioration.  Critical care was time spent personally by me (independent of midlevel providers or residents) on the following activities: development of treatment plan with patient and/or surrogate as well as nursing, discussions with consultants, evaluation of patient's response to treatment, examination of patient, obtaining history from patient or surrogate, ordering and performing treatments and interventions, ordering and review of laboratory studies, ordering and review of radiographic studies, pulse oximetry and re-evaluation of patient's condition.   Length of Stay: [place], MD  05/07/2017, 6:33 AM  Advanced Heart Failure Team Pager [phone] (M-F; 7a - 4p)  Please contact [facility] for night-coverage after hours (4p -7a ) and weekends on [email or web address]

## 2017-05-07 NOTE — Progress Notes (Signed)
PHARMACY NOTE:  ANTIMICROBIAL RENAL DOSAGE ADJUSTMENT  Current antimicrobial regimen includes a mismatch between antimicrobial dosage and estimated renal function.  As per policy approved by the Pharmacy & Therapeutics and Medical Executive Committees, the antimicrobial dosage will be adjusted accordingly.  Current antimicrobial dosage: Meropenem 500 mg every 24 hours   Indication: Bacteremia  Renal Function:  Estimated Creatinine Clearance: 26.3 mL/min (A) (by C-G formula based on SCr of 2.85 mg/dL (H)). []      On intermittent HD, scheduled: [x]      On CRRT    Antimicrobial dosage has been changed to:  Meropenem 1 gm every 12 hours for now  Additional comments: CRRT is supposed to stop today. Spoke with nurse and CRRT is still running. Nurse to inform pharmacist when CRRT is off today. [facility]    Thank you for allowing pharmacy to be a part of this patient's care.  Sharin MonsEmily Laporche Martelle, PharmD, BCPS PGY2 Infectious Diseases Pharmacy Resident Pager: [phone]  05/07/2017 9:09 AM

## 2017-05-07 NOTE — Progress Notes (Signed)
Patient had 3 episodes that he had increased respirations heart rate and blood pressure. Suctioned out large amounts of thick pink tinge mucus. MD in to see patient.

## 2017-05-07 NOTE — Plan of Care (Signed)
  Progressing Clinical Measurements: Ability to maintain clinical measurements within normal limits will improve 05/07/2017 0345 - Progressing by Theodoro ParmaFlynt, Corsica Franson F, RN Will remain free from infection 05/07/2017 0345 - Progressing by Theodoro ParmaFlynt, Tylisha Danis F, RN Diagnostic test results will improve 05/07/2017 0345 - Progressing by Theodoro ParmaFlynt, Jakiya Bookbinder F, RN Elimination: Will not experience complications related to bowel motility 05/07/2017 0345 - Progressing by Theodoro ParmaFlynt, Ilda Laskin F, RN Pain Managment: General experience of comfort will improve 05/07/2017 0345 - Progressing by Theodoro ParmaFlynt, Jakiah Bienaime F, RN

## 2017-05-08 ENCOUNTER — Inpatient Hospital Stay (HOSPITAL_COMMUNITY): Payer: Medicare Other

## 2017-05-08 DIAGNOSIS — R609 Edema, unspecified: Secondary | ICD-10-CM

## 2017-05-08 DIAGNOSIS — N179 Acute kidney failure, unspecified: Secondary | ICD-10-CM

## 2017-05-08 LAB — COOXEMETRY PANEL
Carboxyhemoglobin: 1 % (ref 0.5–1.5)
Carboxyhemoglobin: 0.9 % (ref 0.5–1.5)
Carboxyhemoglobin: 1.3 % (ref 0.5–1.5)
METHEMOGLOBIN: 1.2 % (ref 0.0–1.5)
METHEMOGLOBIN: 1.2 % (ref 0.0–1.5)
Methemoglobin: 0.7 % (ref 0.0–1.5)
O2 SAT: 74 %
O2 Saturation: 61 %
O2 Saturation: 61.9 %
TOTAL HEMOGLOBIN: 8.6 g/dL — AB (ref 12.0–16.0)
Total hemoglobin: 9 g/dL — ABNORMAL LOW (ref 12.0–16.0)
Total hemoglobin: 8.4 g/dL — ABNORMAL LOW (ref 12.0–16.0)

## 2017-05-08 LAB — CBC
HCT: 27.3 % — ABNORMAL LOW (ref 39.0–52.0)
Hemoglobin: 8.6 g/dL — ABNORMAL LOW (ref 13.0–17.0)
MCH: 28.6 pg (ref 26.0–34.0)
MCHC: 31.5 g/dL (ref 30.0–36.0)
MCV: 90.7 fL (ref 78.0–100.0)
PLATELETS: 188 10*3/uL (ref 150–400)
RBC: 3.01 MIL/uL — AB (ref 4.22–5.81)
RDW: 16 % — ABNORMAL HIGH (ref 11.5–15.5)
WBC: 22.7 10*3/uL — ABNORMAL HIGH (ref 4.0–10.5)

## 2017-05-08 LAB — GLUCOSE, CAPILLARY
GLUCOSE-CAPILLARY: 172 mg/dL — AB (ref 65–99)
GLUCOSE-CAPILLARY: 175 mg/dL — AB (ref 65–99)
GLUCOSE-CAPILLARY: 192 mg/dL — AB (ref 65–99)
Glucose-Capillary: 173 mg/dL — ABNORMAL HIGH (ref 65–99)
Glucose-Capillary: 143 mg/dL — ABNORMAL HIGH (ref 65–99)
Glucose-Capillary: 138 mg/dL — ABNORMAL HIGH (ref 65–99)

## 2017-05-08 LAB — MAGNESIUM: MAGNESIUM: 2.4 mg/dL (ref 1.7–2.4)

## 2017-05-08 LAB — RENAL FUNCTION PANEL
Albumin: 1.5 g/dL — ABNORMAL LOW (ref 3.5–5.0)
Albumin: 1.4 g/dL — ABNORMAL LOW (ref 3.5–5.0)
Anion gap: 13 (ref 5–15)
Anion gap: 12 (ref 5–15)
BUN: 69 mg/dL — ABNORMAL HIGH (ref 6–20)
BUN: 78 mg/dL — ABNORMAL HIGH (ref 6–20)
CHLORIDE: 103 mmol/L (ref 101–111)
CHLORIDE: 104 mmol/L (ref 101–111)
CO2: 23 mmol/L (ref 22–32)
CO2: 23 mmol/L (ref 22–32)
CREATININE: 4.22 mg/dL — AB (ref 0.61–1.24)
CREATININE: 4.84 mg/dL — AB (ref 0.61–1.24)
Calcium: 7.4 mg/dL — ABNORMAL LOW (ref 8.9–10.3)
Calcium: 7.6 mg/dL — ABNORMAL LOW (ref 8.9–10.3)
GFR calc non Af Amer: 13 mL/min — ABNORMAL LOW (ref >60)
GFR, EST AFRICAN AMERICAN: 16 mL/min — AB (ref >60)
GFR, EST AFRICAN AMERICAN: 13 mL/min — AB (ref >60)
GFR, EST NON AFRICAN AMERICAN: 11 mL/min — AB (ref >60)
Glucose, Bld: 212 mg/dL — ABNORMAL HIGH (ref 65–99)
Glucose, Bld: 151 mg/dL — ABNORMAL HIGH (ref 65–99)
POTASSIUM: 4.6 mmol/L (ref 3.5–5.1)
Phosphorus: 5.2 mg/dL — ABNORMAL HIGH (ref 2.5–4.6)
Phosphorus: 6.6 mg/dL — ABNORMAL HIGH (ref 2.5–4.6)
Potassium: 4.6 mmol/L (ref 3.5–5.1)
Sodium: 139 mmol/L (ref 135–145)
Sodium: 139 mmol/L (ref 135–145)

## 2017-05-08 LAB — CK: Total CK: 1102 U/L — ABNORMAL HIGH (ref 49–397)

## 2017-05-08 LAB — HEPARIN LEVEL (UNFRACTIONATED): HEPARIN UNFRACTIONATED: 0.31 [IU]/mL (ref 0.30–0.70)

## 2017-05-08 MED ORDER — QUETIAPINE FUMARATE 50 MG PO TABS
50.0000 mg | ORAL_TABLET | Freq: Two times a day (BID) | ORAL | Status: DC
  Administered 2017-05-08 – 2017-05-09 (×3): 50 mg via ORAL
  Filled 2017-05-08 (×3): qty 1

## 2017-05-08 MED ORDER — ACETAMINOPHEN 325 MG PO TABS
650.0000 mg | ORAL_TABLET | Freq: Four times a day (QID) | ORAL | Status: DC | PRN
  Administered 2017-05-08 – 2017-05-10 (×3): 650 mg via ORAL
  Filled 2017-05-08 (×4): qty 2

## 2017-05-08 NOTE — Progress Notes (Signed)
Advanced Heart Failure Rounding Note  PCP-Cardiologist: Skeet Latch, MD   Subjective:    Remains on dobutamine 4 mcg. Coox 74% this am. CVP 8-9 on my personal check. CRRT stopped ~ 1100 05/07/17. Creatinine 2.85 -> 4.22.  Attempted to wean this am. Resp rate up to 45-50 and BP 150-170. Sedation increased and vent taken back to full support.   Remains intubated and sedated.  Tmax 101.5. WBC 22.  Objective:   Weight Range: 188 lb 7.9 oz (85.5 kg) Body mass index is 27.05 kg/m.   Vital Signs:   Temp:  [95.5 F (35.3 C)-101.5 F (38.6 C)] 101.5 F (38.6 C) (02/20 0735) Pulse Rate:  [84-117] 99 (02/20 0700) Resp:  [18-34] 25 (02/20 0700) BP: (101-146)/(52-86) 121/66 (02/20 0700) SpO2:  [91 %-100 %] 100 % (02/20 0700) Arterial Line BP: (113-164)/(51-78) 149/61 (02/20 0700) FiO2 (%):  [30 %] 30 % (02/20 0500) Weight:  [188 lb 7.9 oz (85.5 kg)] 188 lb 7.9 oz (85.5 kg) (02/20 0500) Last BM Date: 05/07/17  Weight change: Filed Weights   05/06/17 0500 05/07/17 0418 05/08/17 0500  Weight: 198 lb 13.7 oz (90.2 kg) 186 lb 11.7 oz (84.7 kg) 188 lb 7.9 oz (85.5 kg)    Intake/Output:   Intake/Output Summary (Last 24 hours) at 05/08/2017 0802 Last data filed at 05/08/2017 0700 Gross per 24 hour  Intake 3064.45 ml  Output 1829 ml  Net 1235.45 ml      Physical Exam  CVP 8-9  General: Intubated/sedated.  HEENT: + ETT. Edentulous.  Neck: Supple. JVP 8-9 cm. Carotids 2+ bilat; no bruits. No thyromegaly or nodule noted. Cor: PMI nondisplaced. RRR, No M/G/R noted Lungs: Diminished basilar sounds Abdomen: Soft, + distention. No HSM. No bruits or masses. +BS  Extremities: No cyanosis, clubbing, or rash. BLE wraps in place. 1-2+ edema.  Neuro: Intubated/sedated   Telemetry   Sinus tach 100-110s, personally reviewed.   EKG    No new tracings.    Labs    CBC Recent Labs    05/07/17 0334 05/08/17 0420  WBC 23.4* 22.7*  HGB 9.4* 8.6*  HCT 29.3* 27.3*  MCV 90.4  90.7  PLT 95* 825   Basic Metabolic Panel Recent Labs    05/07/17 0334 05/08/17 0535  NA 137 139  K 4.4 4.6  CL 103 103  CO2 23 23  GLUCOSE 149* 212*  BUN 42* 69*  CREATININE 2.85* 4.22*  CALCIUM 7.7* 7.4*  MG 2.4 2.4  PHOS 3.8 5.2*   Liver Function Tests Recent Labs    05/06/17 0516  05/07/17 0334 05/08/17 0535  AST 239*  --  177*  --   ALT 378*  --  326*  --   ALKPHOS 109  --  115  --   BILITOT 0.7  --  0.6  --   PROT 6.1*  --  6.9  --   ALBUMIN 1.7*  1.7*   < > 1.7*  1.6* 1.5*   < > = values in this interval not displayed.   No results for input(s): LIPASE, AMYLASE in the last 72 hours. Cardiac Enzymes Recent Labs    05/06/17 0516 05/08/17 0535  CKTOTAL 2,623* 1,102*    BNP: BNP (last 3 results) Recent Labs    05/01/17 1954  BNP 208.4*    ProBNP (last 3 results) No results for input(s): PROBNP in the last 8760 hours.   D-Dimer No results for input(s): DDIMER in the last 72 hours. Hemoglobin A1C No  results for input(s): HGBA1C in the last 72 hours. Fasting Lipid Panel No results for input(s): CHOL, HDL, LDLCALC, TRIG, CHOLHDL, LDLDIRECT in the last 72 hours. Thyroid Function Tests No results for input(s): TSH, T4TOTAL, T3FREE, THYROIDAB in the last 72 hours.  Invalid input(s): FREET3  Other results:   Imaging    No results found.   Medications:     Scheduled Medications: . aspirin  81 mg Per Tube Daily  . chlorhexidine gluconate (MEDLINE KIT)  15 mL Mouth Rinse BID  . free water  100 mL Per Tube Q6H  . furosemide  80 mg Intravenous Q12H  . insulin aspart  2-6 Units Subcutaneous Q4H  . ipratropium-albuterol  3 mL Nebulization Q6H  . mouth rinse  15 mL Mouth Rinse QID  . pantoprazole (PROTONIX) IV  40 mg Intravenous QHS  . silver sulfADIAZINE   Topical Daily  . sodium chloride HYPERTONIC  4 mL Nebulization TID  . thiamine injection  100 mg Intravenous Daily    Infusions: . sodium chloride    . dexmedetomidine (PRECEDEX) IV  infusion 2 mcg/kg/hr (05/08/17 0655)  . DOBUTamine 4 mcg/kg/min (05/08/17 0450)  . feeding supplement (VITAL AF 1.2 CAL) 1,000 mL (05/07/17 0242)  . fentaNYL infusion INTRAVENOUS 100 mcg/hr (05/08/17 0235)  . heparin 1,400 Units/hr (05/08/17 0758)  . heparin    . meropenem (MERREM) IV Stopped (05/08/17 0156)  . norepinephrine (LEVOPHED) Adult infusion Stopped (05/06/17 2214)    PRN Medications: Place/Maintain arterial line **AND** sodium chloride, acetaminophen, docusate, fentaNYL, heparin, heparin, midazolam    Patient Profile   25M with homelessness and no known past medical history here after being found down and hypothermic. He was initially hypotensive and tachycardic and had a leukocytosis. CXR concerning for CAP, urine culture positive for Staph lugdeunensis.Troponin was elevated to 2.8. He was improving and then became responsive and subsequently developed hypoxic respiratory failure requiring intubation. He was found to have extensive lower extremity DVTs and presumptively PE (no CT due to AKI). Echo positive for RV failure and McConnell's sign. Hospitalization complicated by AKI and oliguria.  Assessment/Plan  1. Acute RV failure/cor pulmonale - echo LVEF 65-70% with severe RV HK - Likely massive PE (has extensive DVT on LE u/s). No CT with AKI. Continue heparin.  - Currently on dobutamine 4 mcg. Coox 74%. - Situation complicated by likely sepsis - Given mixed picture would actually favor NE for support but seems to be tolerating dobutamine well so will continue. IF BP gets soft would focus on using NE - With PE is very preload sensitive. Continue to pull/diurese slowly.  - May need to consider IVC filter given burden of LE clot - Long-term prognosis is extremely guarded  2. Shock - likely mixed cardiogenic (RV) and septic - Todays CO-OX is 74% on dobutamine 4 mcg.  Will discuss wean with MD. BP stable to elevated this morning with attempts to wean vent. Pt agitated  so will need to see his pressures settle out with sedation prior to further wean.  - 4. Elevated troponin - Due to PE and RV strain. ECG ok no evidence of regional LV wall motionaabnormality to suggest ACS. No change.  5. Acute respiratory failure - Due to #1. Also PNA on CXR - Remains on vent per CCM. No change.  6. ARF - Due to shock and rhabdo on CVVHD 2/17 -> 2/19. - Renal function much worse today off CVVHD. 2.8 -> 4.22 - Nephrology managing.    7. Sepsis - Staph lugdunensis  in urine (with elevated PSA (42) suggestive of possible prostate CA) - ACINETOBACTER LWOFFII in blood  - WBC 21 -> 23 -> 22.7. Temp 101.5.  - ABX per CCM. Received last ordered dose of Vanc evening of 05/07/17.  8. Frostbite with LE skin loss - WOC following. Appreciate care.  9. Shock liver - Gradually improving.    10. Homelessness  Patient is critically ill and in multisystem organ failure. Prognosis guarded.  Medication concerns reviewed with patient and pharmacy team. Barriers identified: Homelessness.   Length of Stay: [place], [place]  05/08/2017, 8:02 AM  Advanced Heart Failure Team Pager [phone] (M-F; 7a - 4p)  Please contact [facility] for night-coverage after hours (4p -7a ) and weekends on [email or web address]  Agree  Making steady progress but remains critically ill. CVVHD now off. Receiving IV lasix with about 1.2L off yesterday. Urine output seems to be slowing. Creatinine back up to 4 off CVVHD. Dobutamine down to 4. BP ok. Co-ox 61%. Failed vent wean this am  On exam  Intubated/sedated.  RIJ trialysis catheter Cor RRR Lungs clear anteriorly Ab soft Ext 1-2+ edema wrapped   Remains very tenuous. Co-ox improved on reduced dose dobutamine. Will get echo today to re-evaluate RV function. Still with some peripheral edema but CVP 7-8 so would hold diuretics if possible for a day or so to let him equilibrate. Hopefully we can wean dobutamine to off soon.  Possibility of IVC filter discussed with Dr. Elsworth Soho but I agree that it is reasonable to see how much he will recover first given he constellations of severe medical issues on top of his social situation.   CRITICAL CARE Performed by: Glori Bickers  Total critical care time: 35 minutes  Critical care time was exclusive of separately billable procedures and treating other patients.  Critical care was necessary to treat or prevent imminent or life-threatening deterioration.  Critical care was time spent personally by me (independent of midlevel providers or residents) on the following activities: development of treatment plan with patient and/or surrogate as well as nursing, discussions with consultants, evaluation of patient's response to treatment, examination of patient, obtaining history from patient or surrogate, ordering and performing treatments and interventions, ordering and review of laboratory studies, ordering and review of radiographic studies, pulse oximetry and re-evaluation of patient's condition.  Glori Bickers, MD  9:24 AM

## 2017-05-08 NOTE — Progress Notes (Addendum)
PULMONARY / CRITICAL CARE MEDICINE   Name: Joseph Downs MRN: [id number] DOB: 1950-07-09    ADMISSION DATE:  05/01/2017 CONSULTATION DATE:  05/01/17  REFERRING MD:  Dr. Deretha EmoryZackowski  CHIEF COMPLAINT:  Found down  HISTORY OF PRESENT ILLNESS:        This is a 67 year old man who was found down in 40 degree weather. He is homeless. He was tachypneic and hypotensive as well as hypothermic on arrival with a lactic acid in excess of 7.  He received 3.5 L of crystalloid resuscitation along with vancomycin and Zosyn and was warmed with a bear hugger. He was alert and able to communicate in ED providing the following history: "He tells me that he had a syncopal episode last night but is a little hazy on the exact details.  He is not aware that he had any palpitations preceding that episode and he denies a prior history of seizure disorder.  He denies any new pains suggestive of an unprotected fall.  He denies use of street drugs or alcohol." Critical care service is asked to admit the patient because he still has a lactate acid in excess of 6 after being warmed and fluid resuscitated. Patient was initiated on bipap but would not tolerate mask. He was intubated shortly after admission.   SUBJECTIVE:  No acute changes overnight. Levo off. Dobutamine down to 5. Coox improved. Febrile this morning.  VITAL SIGNS: BP (!) 167/69   Pulse (!) 105   Temp (!) 101.5 F (38.6 C) (Oral) Comment: RN Marybeth aware  Resp (!) 26   Ht 5\' 10"  (1.778 m)   Wt 188 lb 7.9 oz (85.5 kg)   SpO2 100%   BMI 27.05 kg/m   HEMODYNAMICS: CVP:  [10 mmHg] 10 mmHg  VENTILATOR SETTINGS: Vent Mode: PRVC FiO2 (%):  [30 %] 30 % Set Rate:  [18 bmp] 18 bmp Vt Set:  [580 mL] 580 mL PEEP:  [5 cmH20] 5 cmH20 Plateau Pressure:  [18 cmH20-22 cmH20] 22 cmH20  INTAKE / OUTPUT: I/O last 3 completed shifts: In: 5482.3 [I.V.:1882.3; NG/GT:3000; IV Piggyback:600] Out: 5021.6 [Urine:2175; Other:2596.6; Stool:250]  PHYSICAL  EXAMINATION: General: Ill-appearing man, lying in bed, sedated, intubated Neuro: sedated, intubated HEENT: ET tube in place, no oral lesions Cardiovascular: Regular, no murmur Lungs: Coarse bilateral breath sounds Abdomen: Soft, benign, positive bowel sounds Musculoskeletal: Bilateral lower extremity pitting edema, feet wrapped bilaterally  Skin: in tact  LABS:  BMET Recent Labs  Lab 05/06/17 1831 05/07/17 0334 05/08/17 0535  NA 137 137 139  K 4.5 4.4 4.6  CL 103 103 103  CO2 23 23 23   BUN 45* 42* 69*  CREATININE 3.21* 2.85* 4.22*  GLUCOSE 130* 149* 212*    Electrolytes Recent Labs  Lab 05/06/17 0516 05/06/17 1831 05/07/17 0334 05/08/17 0535  CALCIUM 7.4* 7.6* 7.7* 7.4*  MG 2.2  --  2.4 2.4  PHOS 3.8 3.8 3.8 5.2*    CBC Recent Labs  Lab 05/06/17 0516 05/07/17 0334 05/08/17 0420  WBC 21.2* 23.4* 22.7*  HGB 8.8* 9.4* 8.6*  HCT 27.6* 29.3* 27.3*  PLT 106* 95* 188    Coag's No results for input(s): APTT, INR in the last 168 hours.  Sepsis Markers Recent Labs  Lab 05/01/17 1954 05/02/17 0237 05/02/17 1049 05/02/17 1835 05/03/17 0328  LATICACIDVEN 5.9*  --  1.5 1.9 1.2  PROCALCITON 14.36 19.69  --   --  22.90    ABG Recent Labs  Lab 05/01/17 2119 05/02/17 0419 05/03/17 0326  PHART 7.436 7.260* 7.336*  PCO2ART 21.6* 47.2 38.0  PO2ART 55.0* 349.0* 114*    Liver Enzymes Recent Labs  Lab 05/03/17 0328  05/06/17 0516 05/06/17 1831 05/07/17 0334 05/08/17 0535  AST 896*  --  239*  --  177*  --   ALT 716*  --  378*  --  326*  --   ALKPHOS 72  --  109  --  115  --   BILITOT 0.7  --  0.7  --  0.6  --   ALBUMIN 1.7*   < > 1.7*  1.7* 1.7* 1.7*  1.6* 1.5*   < > = values in this interval not displayed.    Cardiac Enzymes Recent Labs  Lab 05/02/17 1049 05/02/17 1835 05/03/17 0328  TROPONINI 5.65* 3.84* 2.29*    Glucose Recent Labs  Lab 05/07/17 1140 05/07/17 1522 05/07/17 2019 05/07/17 2344 05/08/17 0412 05/08/17 0732  GLUCAP  173* 159* 81 141* 175* 173*    Imaging No results found.  STUDIES:  CXR 2/13 - no acute cardiopulmonary abnormality ECHO 2/14 - EF 65-70%, G1DD, RV strain  CULTURES: MRSA PCR negative Ucx + staph lugdenensis Blood cx 2/13 >> Diphtheroids + Acinetobacter lwoffii Blood cx 2/19 >> pending  ANTIBIOTICS: Zosyn 2/13> 2/18 Vanc 2/13 >>2/19 Meropenem 2/18 >>>  SIGNIFICANT EVENTS: 2/13 admitted, intubated  LINES/TUBES: ETT 2/13 >>  PIV Foley 2/13 OGT 2/13 Right IJ HD catheter 2/15 >> Left IJ CVC 2/15 >>   DISCUSSION: 67 year old man found poorly responsive outside with evidence for shock and hypothermia.  He was intubated for encephalopathy and hemodynamic instability.  He had a lactic acidosis and acute renal failure, positive troponin.  Evaluation has revealed bilateral lower extremity DVT, right heart strain by echocardiogram, probable PE.  He has polymicrobial bacteremia, no evidence of valvular disease by echo, not yet speciated.  Also with Staph lugdunensis UTI (often associated with prostate cancer). PSA elevated. Progressive oliguric renal failure, now CVVHD. Acinetobacter growing in blood along w/ diphtheroids.   ASSESSMENT / PLAN:  PULMONARY A: Acute respiratory failure on ventilator Probable pulmonary embolism with associated right heart strain P:   Continue current ventilator support as we stabilize hemodynamics, metabolic status, renal function. Continue heparin infusion  CARDIOVASCULAR A:  Stress related non-ST elevation MI, suspect right heart strain Cor pulmonale Cardiogenic shock- improving P:  Appreciate cardiology input Currently on dobutamine 5, levo weaned off Check Co-ox q8 hours- improved to 74 today Gentle Volume removal per CVVHD He will likely ultimately require an ischemic evaluation  RENAL A:   Acute oliguric renal failure Hyperkalemia- resolved Lactic acidosis- resolved Rhabdomyolysis-improving P:   Nephrology following, appreciate  recommendations Renal function worsened off CRRT Follow BMP, urine output Follow CK  GASTROINTESTINAL A:   NPO Elevated LFTs, suspect shock liver P:   Continue PPI Continue tube feeding Follow LFTs  HEMATOLOGIC / ONC A:   Bilateral DVTs Leukocytosis Anemia Elevated PSA, Staph lugdunensis both suggestive of possible prostatic CA Thrombocytopenia- 106 > 95 > 188 P:  Continue heparin drip Follow CBC Transfusion threshold < 7 He will need further evaluation for possible prostate cancer once he stabilizes from his critical illness  INFECTIOUS A:   RLL infiltrate on CXR UTI-  >100,000 colonies staph lugdenenis s/p 7 days of vanc Acinetobacter lwoffii bacteremia P:   continue meropenem for acinetobacter Repeat BC today due to fever Will also culture tracheal aspirate Wound care for his bilateral foot wounds Trend WBC, fevers  ENDOCRINE A:   hyperglycemia P:  Follow CBG Sliding scale insulin per protocol  WOUND CARE A: Bilateral feet with evidence for skin sloughing versus debridement. P: Continue current dressing changes Vascular surgery following, possible arteriogram next week if stable  NEUROLOGIC A:   Toxic metabolic encephalopathy Sedation for ventilator tolerance P:   RASS goal: -1 Continue fentanyl, Precedex Add seroquel 50 mg BID for agitation today Wean sedation as we are able  FAMILY  - Updates: no family at bedside, will try to call emergency contact listed - Inter-disciplinary family meet or Palliative Care meeting due by:  2/20   Dolores Patty, DO PGY-2, [facility] 05/08/2017 8:28 AM

## 2017-05-08 NOTE — Progress Notes (Signed)
ANTICOAGULATION CONSULT NOTE - Follow- Up Consult  Pharmacy Consult for Heparin  Indication: chest pain/ACS / new BL DVTs/possible PE  No Known Allergies  Patient Measurements: Height: 5\' 10"  (177.8 cm) Weight: 188 lb 7.9 oz (85.5 kg) IBW/kg (Calculated) : 73  Vital Signs: Temp: 101.5 F (38.6 C) (02/20 0735) Temp Source: Oral (02/20 0735) BP: 121/66 (02/20 0700) Pulse Rate: 99 (02/20 0700)  Labs: Recent Labs    05/06/17 0516 05/06/17 1831 05/07/17 0334 05/08/17 0420 05/08/17 0535  HGB 8.8*  --  9.4* 8.6*  --   HCT 27.6*  --  29.3* 27.3*  --   PLT 106*  --  95* 188  --   HEPARINUNFRC 0.52  --  0.65  --  0.31  CREATININE 3.54* 3.21* 2.85*  --  4.22*  CKTOTAL 2,623*  --   --   --  1,102*    Estimated Creatinine Clearance: 17.8 mL/min (A) (by C-G formula based on SCr of 4.22 mg/dL (H)).  Assessment: [name] in the ICU after being "found down".Patient has been found to have bilateral lower extremity with concern for possible PE.  Heparin level today remains therapeutic(toward the lower end of goal) at 0.31 on 1350 units/hr.No issues with infusion per RN. H/H down slightly. Platelets are up. No signs of bleeding have been noted.   Goal of Therapy:  Heparin level 0.3-0.7 units/ml Monitor platelets by anticoagulation protocol: Yes   Plan:  Increase heparin to 1400 units/hr  Daily heparin level and CBC Follow for long term plans for anticoagulation  Sharin MonsEmily Sinclair, PharmD, BCPS PGY2 Infectious Diseases Pharmacy Resident Pager: [phone]  05/08/2017 7:50 AM

## 2017-05-08 NOTE — Progress Notes (Signed)
Admit: 05/01/2017 LOS: 7  23M with AKI on CRRT after likely massive PE, shock, bacteremia  Subjective:  Failed vent ween today  CRRT discontinued yesterday Morning labs: Creatinine 4.22 (increased from 2.85 yesterday), potassium 4.6, BUN 69, bicarbonate 23, hemoglobin 8.6 1.2 L urine output yesterday Remains on dobutamine. Febrile to 101.5 this morning  02/19 0701 - 02/20 0700 In: 3219.8 [I.V.:819.8; NG/GT:2000; IV Piggyback:400] Out: 1975 [Urine:1250; Stool:250]  Filed Weights   05/06/17 0500 05/07/17 0418 05/08/17 0500  Weight: 90.2 kg (198 lb 13.7 oz) 84.7 kg (186 lb 11.7 oz) 85.5 kg (188 lb 7.9 oz)    Scheduled Meds: . aspirin  81 mg Per Tube Daily  . chlorhexidine gluconate (MEDLINE KIT)  15 mL Mouth Rinse BID  . free water  100 mL Per Tube Q6H  . furosemide  80 mg Intravenous Q12H  . insulin aspart  2-6 Units Subcutaneous Q4H  . ipratropium-albuterol  3 mL Nebulization Q6H  . mouth rinse  15 mL Mouth Rinse QID  . pantoprazole (PROTONIX) IV  40 mg Intravenous QHS  . silver sulfADIAZINE   Topical Daily  . sodium chloride HYPERTONIC  4 mL Nebulization TID  . thiamine injection  100 mg Intravenous Daily   Continuous Infusions: . sodium chloride    . dexmedetomidine (PRECEDEX) IV infusion 2 mcg/kg/hr (05/08/17 0655)  . DOBUTamine 2.5 mcg/kg/min (05/08/17 0827)  . feeding supplement (VITAL AF 1.2 CAL) 1,000 mL (05/07/17 0242)  . fentaNYL infusion INTRAVENOUS 100 mcg/hr (05/08/17 0820)  . heparin 1,400 Units/hr (05/08/17 0758)  . heparin    . meropenem (MERREM) IV Stopped (05/08/17 0156)  . norepinephrine (LEVOPHED) Adult infusion Stopped (05/06/17 2214)   PRN Meds:.Place/Maintain arterial line **AND** sodium chloride, acetaminophen, docusate, fentaNYL, heparin, heparin, midazolam  Current Labs: reviewed    Physical Exam:  Blood pressure (!) 167/69, pulse (!) 105, temperature (!) 101.5 F (38.6 C), temperature source Oral, resp. rate (!) 26, height 5' 10"  (1.778 m),  weight 85.5 kg (188 lb 7.9 oz), SpO2 100 %. Chronically ill appearing Sedated / intubated Tachy, regular Coarse bs b/l Ulcers / sores of b/l feet Temp HD cath R IJ  A 1. 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 B/l DVT and presumed PE on heparin 4. RV Failure on dobutamine 5. Anemia 6. VDRF 7. Homelessness 8. Sepsis with UTI and Acinetobacter bacteremia vanc/zosyn 9. Presence of temporary dialysis catheter 10. B/l sores/ulcers of feet/ frostbite   P 1. No dialysis indicated today; continue twice daily Lasix 2. Follow fever w/u given he has temp HD cath, might need line holiday   Pearson Grippe MD 05/08/2017, 8:32 AM  Recent Labs  Lab 05/06/17 1831 05/07/17 0334 05/08/17 0535  NA 137 137 139  K 4.5 4.4 4.6  CL 103 103 103  CO2 23 23 23   GLUCOSE 130* 149* 212*  BUN 45* 42* 69*  CREATININE 3.21* 2.85* 4.22*  CALCIUM 7.6* 7.7* 7.4*  PHOS 3.8 3.8 5.2*   Recent Labs  Lab 05/01/17 1110  05/06/17 0516 05/07/17 0334 05/08/17 0420  WBC 15.6*   < > 21.2* 23.4* 22.7*  NEUTROABS 13.0*  --   --   --   --   HGB 13.1   < > 8.8* 9.4* 8.6*  HCT 41.3   < > 27.6* 29.3* 27.3*  MCV 92.0   < > 89.3 90.4 90.7  PLT 181   < >  106* 95* 188   < > = values in this interval not displayed.

## 2017-05-08 NOTE — Progress Notes (Signed)
CSW received call that pt's ex wife has brought an insurance card in. CSW informed staff to make a copy and place it on pt's chart so that it can go to Medical records if ever needed. CSW will continue to follow for any needs at this time.     Claude MangesKierra S. Jia Dottavio, MSW, LCSW-A Emergency Department Clinical Social Worker [phone]

## 2017-05-09 ENCOUNTER — Inpatient Hospital Stay (HOSPITAL_COMMUNITY): Payer: Medicare Other

## 2017-05-09 DIAGNOSIS — I361 Nonrheumatic tricuspid (valve) insufficiency: Secondary | ICD-10-CM

## 2017-05-09 LAB — GLUCOSE, CAPILLARY
GLUCOSE-CAPILLARY: 130 mg/dL — AB (ref 65–99)
Glucose-Capillary: 126 mg/dL — ABNORMAL HIGH (ref 65–99)
Glucose-Capillary: 176 mg/dL — ABNORMAL HIGH (ref 65–99)
Glucose-Capillary: 178 mg/dL — ABNORMAL HIGH (ref 65–99)
Glucose-Capillary: 122 mg/dL — ABNORMAL HIGH (ref 65–99)

## 2017-05-09 LAB — COOXEMETRY PANEL
CARBOXYHEMOGLOBIN: 1.1 % (ref 0.5–1.5)
CARBOXYHEMOGLOBIN: 1 % (ref 0.5–1.5)
Carboxyhemoglobin: 1.3 % (ref 0.5–1.5)
Carboxyhemoglobin: 1.3 % (ref 0.5–1.5)
METHEMOGLOBIN: 0.6 % (ref 0.0–1.5)
METHEMOGLOBIN: 1 % (ref 0.0–1.5)
Methemoglobin: 0.8 % (ref 0.0–1.5)
Methemoglobin: 1.3 % (ref 0.0–1.5)
O2 SAT: 62.5 %
O2 SAT: 62.1 %
O2 Saturation: 61.6 %
O2 Saturation: 57.9 %
TOTAL HEMOGLOBIN: 9.6 g/dL — AB (ref 12.0–16.0)
Total hemoglobin: 6.4 g/dL — CL (ref 12.0–16.0)
Total hemoglobin: 8.1 g/dL — ABNORMAL LOW (ref 12.0–16.0)
Total hemoglobin: 9 g/dL — ABNORMAL LOW (ref 12.0–16.0)

## 2017-05-09 LAB — RENAL FUNCTION PANEL
ANION GAP: 14 (ref 5–15)
Albumin: 1.3 g/dL — ABNORMAL LOW (ref 3.5–5.0)
BUN: 91 mg/dL — ABNORMAL HIGH (ref 6–20)
CHLORIDE: 106 mmol/L (ref 101–111)
CO2: 19 mmol/L — AB (ref 22–32)
Calcium: 7.5 mg/dL — ABNORMAL LOW (ref 8.9–10.3)
Creatinine, Ser: 5.42 mg/dL — ABNORMAL HIGH (ref 0.61–1.24)
GFR calc non Af Amer: 10 mL/min — ABNORMAL LOW (ref >60)
GFR, EST AFRICAN AMERICAN: 11 mL/min — AB (ref >60)
Glucose, Bld: 221 mg/dL — ABNORMAL HIGH (ref 65–99)
POTASSIUM: 4.6 mmol/L (ref 3.5–5.1)
Phosphorus: 7 mg/dL — ABNORMAL HIGH (ref 2.5–4.6)
Sodium: 139 mmol/L (ref 135–145)

## 2017-05-09 LAB — CBC
HEMATOCRIT: 25 % — AB (ref 39.0–52.0)
HEMOGLOBIN: 7.9 g/dL — AB (ref 13.0–17.0)
MCH: 28.3 pg (ref 26.0–34.0)
MCHC: 31.6 g/dL (ref 30.0–36.0)
MCV: 89.6 fL (ref 78.0–100.0)
Platelets: 148 10*3/uL — ABNORMAL LOW (ref 150–400)
RBC: 2.79 MIL/uL — AB (ref 4.22–5.81)
RDW: 14.7 % (ref 11.5–15.5)
WBC: 19.2 10*3/uL — AB (ref 4.0–10.5)

## 2017-05-09 LAB — ECHOCARDIOGRAM LIMITED
HEIGHTINCHES: 70 in
WEIGHTICAEL: 3044.11 [oz_av]

## 2017-05-09 LAB — MAGNESIUM: MAGNESIUM: 2.4 mg/dL (ref 1.7–2.4)

## 2017-05-09 LAB — HEPARIN LEVEL (UNFRACTIONATED): HEPARIN UNFRACTIONATED: 0.43 [IU]/mL (ref 0.30–0.70)

## 2017-05-09 MED ORDER — CLONAZEPAM 0.5 MG PO TBDP
0.5000 mg | ORAL_TABLET | Freq: Two times a day (BID) | ORAL | Status: DC
  Administered 2017-05-09 – 2017-05-10 (×4): 0.5 mg via ORAL
  Filled 2017-05-09 (×4): qty 1

## 2017-05-09 MED ORDER — NOREPINEPHRINE BITARTRATE 1 MG/ML IV SOLN: 0.0000 ug/min | INTRAVENOUS | Status: DC

## 2017-05-09 MED ORDER — MAGNESIUM SULFATE 2 GM/50ML IV SOLN
2.0000 g | Freq: Once | INTRAVENOUS | Status: AC
  Administered 2017-05-09: 2 g via INTRAVENOUS
  Filled 2017-05-09: qty 50

## 2017-05-09 MED ORDER — CLONAZEPAM 0.1 MG/ML ORAL SUSPENSION
0.5000 mg | Freq: Two times a day (BID) | ORAL | Status: DC
  Filled 2017-05-09: qty 5

## 2017-05-09 MED ORDER — QUETIAPINE FUMARATE 100 MG PO TABS
100.0000 mg | ORAL_TABLET | Freq: Two times a day (BID) | ORAL | Status: DC
  Administered 2017-05-09 – 2017-05-16 (×14): 100 mg via ORAL
  Filled 2017-05-09 (×15): qty 1

## 2017-05-09 MED ORDER — NOREPINEPHRINE 16 MG/250ML-% IV SOLN
0.0000 ug/min | INTRAVENOUS | Status: DC
  Administered 2017-05-09: 2 ug/min via INTRAVENOUS
  Administered 2017-05-10: 8 ug/min via INTRAVENOUS
  Filled 2017-05-09 (×2): qty 250

## 2017-05-09 NOTE — Progress Notes (Signed)
PULMONARY / CRITICAL CARE MEDICINE   Name: Joseph FloridaLarry E Dier MRN: [id number] DOB: [birth year]    ADMISSION DATE:  05/01/2017 CONSULTATION DATE:  05/01/17  REFERRING MD:  Dr. Deretha EmoryZackowski  CHIEF COMPLAINT:  Found down  HISTORY OF PRESENT ILLNESS:        This is a 67 year old man who was found down in 40 degree weather. He is homeless. He was tachypneic and hypotensive as well as hypothermic on arrival with a lactic acid in excess of 7.  He received 3.5 L of crystalloid resuscitation along with vancomycin and Zosyn and was warmed with a bear hugger. He was alert and able to communicate in ED providing the following history: "He tells me that he had a syncopal episode last night but is a little hazy on the exact details.  He is not aware that he had any palpitations preceding that episode and he denies a prior history of seizure disorder.  He denies any new pains suggestive of an unprotected fall.  He denies use of street drugs or alcohol." Critical care service is asked to admit the patient because he still has a lactate acid in excess of 6 after being warmed and fluid resuscitated. Patient was initiated on bipap but would not tolerate mask. He was intubated shortly after admission.   SUBJECTIVE:  No acute changes overnight. Levo off. Dobutamine down to 5. Coox stable. Febrile overnight.  VITAL SIGNS: BP (!) 96/52 (BP Location: Right Arm)   Pulse 70   Temp 98.3 F (36.8 C) (Oral)   Resp 18   Ht 5\' 10"  (1.778 m)   Wt 190 lb 4.1 oz (86.3 kg)   SpO2 100%   BMI 27.30 kg/m   HEMODYNAMICS: CVP:  [68 mmHg] 68 mmHg  VENTILATOR SETTINGS: Vent Mode: PSV;CPAP FiO2 (%):  [30 %] 30 % Set Rate:  [18 bmp] 18 bmp Vt Set:  [580 mL] 580 mL PEEP:  [5 cmH20] 5 cmH20 Pressure Support:  [10 cmH20] 10 cmH20 Plateau Pressure:  [17 cmH20-20 cmH20] 17 cmH20  INTAKE / OUTPUT: I/O last 3 completed shifts: In: 5777.7 [I.V.:2042.7; NG/GT:3335; IV Piggyback:400] Out: 2770 [Urine:2770]  PHYSICAL  EXAMINATION: General: Ill-appearing man, lying in bed, sedated, intubated Neuro: sedated, intubated HEENT: ET tube in place, no oral lesions Cardiovascular: Regular, no murmur Lungs: Coarse bilateral breath sounds Abdomen: Soft, benign, positive bowel sounds Musculoskeletal: Bilateral lower extremity pitting edema, feet wrapped bilaterally  Skin: in tact  LABS:  BMET Recent Labs  Lab 05/08/17 0535 05/08/17 1600 05/09/17 0608  NA 139 139 139  K 4.6 4.6 4.6  CL 103 104 106  CO2 23 23 19*  BUN 69* 78* 91*  CREATININE 4.22* 4.84* 5.42*  GLUCOSE 212* 151* 221*    Electrolytes Recent Labs  Lab 05/07/17 0334 05/08/17 0535 05/08/17 1600 05/09/17 0608  CALCIUM 7.7* 7.4* 7.6* 7.5*  MG 2.4 2.4  --  2.4  PHOS 3.8 5.2* 6.6* 7.0*    CBC Recent Labs  Lab 05/07/17 0334 05/08/17 0420 05/09/17 0608  WBC 23.4* 22.7* 19.2*  HGB 9.4* 8.6* 7.9*  HCT 29.3* 27.3* 25.0*  PLT 95* 188 148*    Coag's No results for input(s): APTT, INR in the last 168 hours.  Sepsis Markers Recent Labs  Lab 05/02/17 1835 05/03/17 0328  LATICACIDVEN 1.9 1.2  PROCALCITON  --  22.90    ABG Recent Labs  Lab 05/03/17 0326  PHART 7.336*  PCO2ART 38.0  PO2ART 114*    Liver Enzymes Recent Labs  Lab  05/03/17 0328  05/06/17 0516  05/07/17 0334 05/08/17 0535 05/08/17 1600 05/09/17 0608  AST 896*  --  239*  --  177*  --   --   --   ALT 716*  --  378*  --  326*  --   --   --   ALKPHOS 72  --  109  --  115  --   --   --   BILITOT 0.7  --  0.7  --  0.6  --   --   --   ALBUMIN 1.7*   < > 1.7*  1.7*   < > 1.7*  1.6* 1.5* 1.4* 1.3*   < > = values in this interval not displayed.    Cardiac Enzymes Recent Labs  Lab 05/02/17 1835 05/03/17 0328  TROPONINI 3.84* 2.29*    Glucose Recent Labs  Lab 05/08/17 1600 05/08/17 2032 05/09/17 0001 05/09/17 0335 05/09/17 0808 05/09/17 1157  GLUCAP 138* 192* 172* 126* 176* 178*    Imaging Dg Chest Port 1 View  Result Date:  05/09/2017 CLINICAL DATA:  Intubation. EXAM: PORTABLE CHEST 1 VIEW COMPARISON:  05/08/2017. FINDINGS: Endotracheal tube tip approximately 9 mm above the carina. Proximal repositioning of approximately 2 cm should be considered. NG tube noted with tip below left hemidiaphragm. Bilateral IJ lines noted stable position. Progressive bibasilar atelectasis. Small bilateral pleural effusions. No pneumothorax. Heart size normal. IMPRESSION: 1. Endotracheal tube tip approximately 9 mm above the carina. Proximal repositioning of approximately 2 cm should be considered. Remaining lines and tubes in stable position. 2. Progressive bibasilar atelectasis. Small bilateral pleural effusions. Electronically Signed   By: Maisie Fus  Register   On: 05/09/2017 06:27    STUDIES:  CXR 2/13 - no acute cardiopulmonary abnormality ECHO 2/14 - EF 65-70%, G1DD, RV strain  CULTURES: MRSA PCR negative Ucx + staph lugdenensis Blood cx 2/13 >> Diphtheroids + Acinetobacter lwoffii Blood cx 2/19 >>NGTD  ANTIBIOTICS: Zosyn 2/13> 2/18 Vanc 2/13 >>2/19 Meropenem 2/18 >>  SIGNIFICANT EVENTS: 2/13 admitted, intubated  LINES/TUBES: ETT 2/13 >>  PIV Foley 2/13 OGT 2/13 Right IJ HD catheter 2/15 >> Left IJ CVC 2/15 >>   DISCUSSION: 67 year old man found poorly responsive outside with evidence for shock and hypothermia.  He was intubated for encephalopathy and hemodynamic instability.  He had a lactic acidosis and acute renal failure, positive troponin.  Evaluation has revealed bilateral lower extremity DVT, right heart strain by echocardiogram, probable PE.  He has polymicrobial bacteremia, no evidence of valvular disease by echo, not yet speciated.  Also with Staph lugdunensis UTI (often associated with prostate cancer). PSA elevated. Progressive oliguric renal failure, now CVVHD. Acinetobacter growing in blood along w/ diphtheroids.   ASSESSMENT / PLAN:  PULMONARY A: Acute respiratory failure on ventilator Likely  pulmonary embolism with associated right heart strain  P:   Continue current ventilator support as we stabilize hemodynamics, metabolic status, renal function. Continue heparin infusion  CARDIOVASCULAR A:  Stress related non-ST elevation MI, suspect right heart strain Cor pulmonale Cardiogenic shock- improving. Coox stable.  P:  Appreciate cardiology input Continue diuresis Follow up on Echo Discontinue Dobutamine, levo weaned off F/u on Co-ox q8 hours He will likely ultimately require an ischemic evaluation  RENAL A:   Acute oliguric renal failure-improving. UOP ~2L Hyperkalemia- resolved Lactic acidosis- resolved Rhabdomyolysis-improving  P:   Nephrology following, appreciate recommendations Renal function worsened off CRRT Follow BMP, urine output   GASTROINTESTINAL A:   NPO Elevated LFTs, suspect shock liver P:  Continue PPI Continue tube feeding Follow LFTs  HEMATOLOGIC / ONC A:   Bilateral DVTs Leukocytosis Anemia Elevated PSA, Staph lugdunensis both suggestive of possible prostatic CA Thrombocytopenia- 106 > 95 > 188 P:  Continue heparin drip Follow CBC Transfusion threshold < 7 He will need further evaluation for possible prostate cancer once he stabilizes from his critical illness  INFECTIOUS A:   RLL infiltrate on CXR UTI-  >100,000 colonies staph lugdenenis s/p 7 days of vanc Acinetobacter lwoffii bacteremia. Blood cx have shown NGTD.  P:   continue meropenem for acinetobacter Follow up on blood cultures Will also culture tracheal aspirate Wound care for his bilateral foot wounds Trend WBC, fevers  ENDOCRINE A:   hyperglycemia P:   Follow CBG Sliding scale insulin per protocol  WOUND CARE A: Bilateral feet with evidence for skin sloughing versus debridement. P: Continue current dressing changes Vascular surgery following, possible arteriogram next week if stable  NEUROLOGIC A:   Toxic metabolic encephalopathy Sedation for  ventilator tolerance P:   RASS goal: -1 Off fentanyl, on Precedex Added klonopin 0.5 bid titrate prn Increase seroquel 100 mg BID for agitation today Versed prn  Wean sedation as we are able  FAMILY  - Updates: no family at bedside, will try to call emergency contact listed - Inter-disciplinary family meet or Palliative Care meeting due by:  2/20    Lovena Neighbours, MD [facility], PGY-2   05/09/2017 12:40 PM

## 2017-05-09 NOTE — Care Management Note (Signed)
Case Management Note  Patient Details  Name: Joseph Downs MRN: [id number] Date of Birth: [birth year]  Subjective/Objective:    Pt admitted with Hypothermia shock, and bilateral DVTs                Action/Plan:  PTA from home and was found down.  Pt remains on ventilator via ETT tube.  Pt originally on CRRT - now discontinued and daily IHD assessments.  CM will continue to follow for discharge needs   Expected Discharge Date:                  Expected Discharge Plan:     In-House Referral:     Discharge planning Services  CM Consult  Post Acute Care Choice:    Choice offered to:     DME Arranged:    DME Agency:     HH Arranged:    HH Agency:     Status of Service:     If discussed at [employer] Meetings, dates discussed:    Additional Comments:  [name], Corah Willeford S, RN 05/09/2017, 4:29 PM

## 2017-05-09 NOTE — Progress Notes (Signed)
ANTICOAGULATION CONSULT NOTE - Follow- Up Consult  Pharmacy Consult for Heparin  Indication: chest pain/ACS / new BL DVTs/possible PE  No Known Allergies  Patient Measurements: Height: 5\' 10"  (177.8 cm) Weight: 190 lb 4.1 oz (86.3 kg) IBW/kg (Calculated) : 73  Vital Signs: Temp: 99.2 F (37.3 C) (02/21 0815) Temp Source: Oral (02/21 0815) BP: 138/56 (02/21 0814) Pulse Rate: 85 (02/21 0814)  Labs: Recent Labs    05/07/17 0334 05/08/17 0420 05/08/17 0535 05/08/17 1600 05/09/17 0607 05/09/17 0608  HGB 9.4* 8.6*  --   --   --  7.9*  HCT 29.3* 27.3*  --   --   --  25.0*  PLT 95* 188  --   --   --  148*  HEPARINUNFRC 0.65  --  0.31  --  0.43  --   CREATININE 2.85*  --  4.22* 4.84*  --  5.42*  CKTOTAL  --   --  1,102*  --   --   --     Estimated Creatinine Clearance: 13.8 mL/min (A) (by C-G formula based on SCr of 5.42 mg/dL (H)).  Assessment: [name] in the ICU after being "found down".Patient has been found to have bilateral lower extremity with concern for possible PE.  Heparin level today remains therapeutic at 0.4 on 1400 units/hr.No issues with infusion per RN. H/H down slightly. Platelets 148. No signs of bleeding have been noted.   Goal of Therapy:  Heparin level 0.3-0.7 units/ml Monitor platelets by anticoagulation protocol: Yes   Plan:  Continue heparin at 1400 units/hr  Daily heparin level and CBC Follow for long term plans for anticoagulation  Sheppard CoilFrank Khaleem Burchill PharmD., BCPS Clinical Pharmacist 05/09/2017 10:12 AM

## 2017-05-09 NOTE — Progress Notes (Addendum)
Advanced Heart Failure Rounding Note  PCP-Cardiologist: Skeet Latch, MD   Subjective:    Remains intubated and sedated.  Remains febrile up to 101.6  Failed vent wean yesterday.   Dobutamine down to 2. Off CVVHD. SBP 110-120.   Co-ox 62% Weight up 2 pounds. AM labs pending.   Objective:   Weight Range: 86.3 kg (190 lb 4.1 oz) Body mass index is 27.3 kg/m.   Vital Signs:   Temp:  [99.8 F (37.7 C)-101.6 F (38.7 C)] 101.1 F (38.4 C) (02/21 0336) Pulse Rate:  [82-105] 88 (02/21 0400) Resp:  [20-34] 23 (02/21 0400) BP: (95-167)/(55-71) 118/64 (02/21 0400) SpO2:  [100 %] 100 % (02/21 0400) Arterial Line BP: (112-149)/(51-61) 133/55 (02/21 0400) FiO2 (%):  [30 %] 30 % (02/21 0400) Weight:  [86.3 kg (190 lb 4.1 oz)] 86.3 kg (190 lb 4.1 oz) (02/21 0418) Last BM Date: 05/08/17  Weight change: Filed Weights   05/07/17 0418 05/08/17 0500 05/09/17 0418  Weight: 84.7 kg (186 lb 11.7 oz) 85.5 kg (188 lb 7.9 oz) 86.3 kg (190 lb 4.1 oz)    Intake/Output:   Intake/Output Summary (Last 24 hours) at 05/09/2017 0534 Last data filed at 05/09/2017 0400 Gross per 24 hour  Intake 4810.56 ml  Output 2010 ml  Net 2800.56 ml      Physical Exam  CVP 3-4  General:  Intubated/sedated withdraws to noxious stimuli HEENT: ETT edentulous Neck: supple. RIJ trailysis. Carotids 2+ bilat; no bruits. No lymphadenopathy or thryomegaly appreciated. Cor: PMI nondisplaced. Regular rate & rhythm. 2/6 TR. Lungs: clear Abdomen: soft, nontender, mildly distended. No hepatosplenomegaly. No bruits or masses. Good bowel sounds. Extremities: no cyanosis, clubbing, rash, 2+ edema feet wrapped with gangrenous changes  Neuro: Intubated/sedated withdraws to noxious stimuli  Telemetry   Sinus 80s, personally reviewed.   EKG    No new tracings.    Labs    CBC Recent Labs    05/07/17 0334 05/08/17 0420  WBC 23.4* 22.7*  HGB 9.4* 8.6*  HCT 29.3* 27.3*  MCV 90.4 90.7  PLT 95* 250    Basic Metabolic Panel Recent Labs    05/07/17 0334 05/08/17 0535 05/08/17 1600  NA 137 139 139  K 4.4 4.6 4.6  CL 103 103 104  CO2 23 23 23   GLUCOSE 149* 212* 151*  BUN 42* 69* 78*  CREATININE 2.85* 4.22* 4.84*  CALCIUM 7.7* 7.4* 7.6*  MG 2.4 2.4  --   PHOS 3.8 5.2* 6.6*   Liver Function Tests Recent Labs    05/07/17 0334 05/08/17 0535 05/08/17 1600  AST 177*  --   --   ALT 326*  --   --   ALKPHOS 115  --   --   BILITOT 0.6  --   --   PROT 6.9  --   --   ALBUMIN 1.7*  1.6* 1.5* 1.4*   No results for input(s): LIPASE, AMYLASE in the last 72 hours. Cardiac Enzymes Recent Labs    05/08/17 0535  CKTOTAL 1,102*    BNP: BNP (last 3 results) Recent Labs    05/01/17 1954  BNP 208.4*    ProBNP (last 3 results) No results for input(s): PROBNP in the last 8760 hours.   D-Dimer No results for input(s): DDIMER in the last 72 hours. Hemoglobin A1C No results for input(s): HGBA1C in the last 72 hours. Fasting Lipid Panel No results for input(s): CHOL, HDL, LDLCALC, TRIG, CHOLHDL, LDLDIRECT in the last 72 hours. Thyroid Function  Tests No results for input(s): TSH, T4TOTAL, T3FREE, THYROIDAB in the last 72 hours.  Invalid input(s): FREET3  Other results:   Imaging    Dg Chest Port 1 View  Result Date: 05/08/2017 CLINICAL DATA:  Hypoxia EXAM: PORTABLE CHEST 1 VIEW COMPARISON:  May 07, 2017 FINDINGS: Endotracheal tube tip is 1.5 cm above the carina. Central catheter tips are in the superior vena cava. Nasogastric tube tip and side port are in the stomach. No pneumothorax. There is patchy bibasilar atelectasis. There is a minimal right pleural effusion. Heart is upper normal in size with pulmonary vascularity within normal limits. No adenopathy. No bone lesions. IMPRESSION: Tube and catheter positions as described without pneumothorax. Note that the endotracheal tube tip is fairly close to the carina. It may be prudent to consider withdrawing endotracheal  tube approximately 2 cm. Bibasilar atelectasis. Minimal right pleural effusion. No frank consolidation. Stable cardiac silhouette. Electronically Signed   By: Lowella Grip III M.D.   On: 05/08/2017 08:58     Medications:     Scheduled Medications: . aspirin  81 mg Per Tube Daily  . chlorhexidine gluconate (MEDLINE KIT)  15 mL Mouth Rinse BID  . free water  100 mL Per Tube Q6H  . insulin aspart  2-6 Units Subcutaneous Q4H  . ipratropium-albuterol  3 mL Nebulization Q6H  . mouth rinse  15 mL Mouth Rinse QID  . pantoprazole (PROTONIX) IV  40 mg Intravenous QHS  . QUEtiapine  50 mg Oral BID  . silver sulfADIAZINE   Topical Daily  . sodium chloride HYPERTONIC  4 mL Nebulization TID  . thiamine injection  100 mg Intravenous Daily    Infusions: . sodium chloride    . dexmedetomidine (PRECEDEX) IV infusion 2 mcg/kg/hr (05/09/17 0411)  . DOBUTamine 1.5 mcg/kg/min (05/08/17 1430)  . feeding supplement (VITAL AF 1.2 CAL) 1,000 mL (05/08/17 2004)  . fentaNYL infusion INTRAVENOUS 100 mcg/hr (05/08/17 2349)  . heparin 1,400 Units/hr (05/08/17 2103)  . heparin    . meropenem (MERREM) IV Stopped (05/08/17 2341)  . norepinephrine (LEVOPHED) Adult infusion Stopped (05/06/17 2214)    PRN Medications: Place/Maintain arterial line **AND** sodium chloride, acetaminophen, docusate, fentaNYL, heparin, heparin, midazolam    Patient Profile   37M with homelessness and no known past medical history here after being found down and hypothermic. He was initially hypotensive and tachycardic and had a leukocytosis. CXR concerning for CAP, urine culture positive for Staph lugdeunensis.Troponin was elevated to 2.8. He was improving and then became responsive and subsequently developed hypoxic respiratory failure requiring intubation. He was found to have extensive lower extremity DVTs and presumptively PE (no CT due to AKI). Echo positive for RV failure and McConnell's sign. Hospitalization  complicated by AKI and oliguria.  Assessment/Plan  1. Acute RV failure/cor pulmonale - echo LVEF 65-70% with severe RV HK - Likely massive PE (has extensive DVT on LE u/s). No CT with AKI. Continue heparin  - Currently on dobutamine 2 mcg. Coox 63%. - Situation complicated by likely sepsis - Desaite edema. CVP down to 3-4. Likely third spacing. Would continue with gentle diuresis. Ideally would wrap legs gently but need to be careful not to decrease perfusion to feet - Repeat echo today. - Likely can wen dobutamine to off. Keep co-ox > 55%  2. Shock - likely mixed cardiogenic (RV) and septic - Todays CO-OX is 63% on dobutamine 2 mcg.   - Management as per above - 4. Elevated troponin - Due to PE and RV  strain. ECG ok no evidence of regional LV wall motionaabnormality to suggest ACS. No change.  5. Acute respiratory failure - Due to #1. Also PNA on CXR - Remains on vent and abx per CCM.   6. ARF - Due to shock and rhabdo on CVVHD 2/17 -> 2/19. - BMET pending  - Nephrology managing.    7. Sepsis - Staph lugdunensis in urine (with elevated PSA (42) suggestive of possible prostate CA) - ACINETOBACTER LWOFFII in blood  - WBC 21 -> 23 -> 22.7. Temp 101.5.  - ABX per CCM.   8. Frostbite with LE skin loss - WOC following. Appreciate care.  9. Shock liver - Gradually improving.    10. Homelessness  CRITICAL CARE Performed by: Glori Bickers  Total critical care time: 35 minutes  Critical care time was exclusive of separately billable procedures and treating other patients.  Critical care was necessary to treat or prevent imminent or life-threatening deterioration.  Critical care was time spent personally by me (independent of midlevel providers or residents) on the following activities: development of treatment plan with patient and/or surrogate as well as nursing, discussions with consultants, evaluation of patient's response to treatment, examination of  patient, obtaining history from patient or surrogate, ordering and performing treatments and interventions, ordering and review of laboratory studies, ordering and review of radiographic studies, pulse oximetry and re-evaluation of patient's condition.    Length of Stay: Page, MD  05/09/2017, 5:34 AM  Advanced Heart Failure Team Pager [phone] (M-F; 7a - 4p)  Please contact [facility] for night-coverage after hours (4p -7a ) and weekends on [email or web address]

## 2017-05-09 NOTE — Progress Notes (Signed)
  Echocardiogram 2D Echocardiogram has been performed.  Joseph Downs 05/09/2017, 10:31 AM

## 2017-05-09 NOTE — Progress Notes (Signed)
Admit: 05/01/2017 LOS: 8  60M with AKI on CRRT after likely massive PE, shock, bacteremia  Subjective:  2.1 L urine output yesterday Running labs: Creatinine 5.42, BUN 91, potassium 4.6, bicarbonate 19, hemoglobin 7.9 Off dobutamine Remains intermittently febrile Off diuretics currently  02/20 0701 - 02/21 0700 In: 4219.1 [I.V.:1959.1; NG/GT:2160; IV Piggyback:100] Out: 2170 [Urine:2170]  Filed Weights   05/07/17 0418 05/08/17 0500 05/09/17 0418  Weight: 84.7 kg (186 lb 11.7 oz) 85.5 kg (188 lb 7.9 oz) 86.3 kg (190 lb 4.1 oz)    Scheduled Meds: . aspirin  81 mg Per Tube Daily  . chlorhexidine gluconate (MEDLINE KIT)  15 mL Mouth Rinse BID  . free water  100 mL Per Tube Q6H  . insulin aspart  2-6 Units Subcutaneous Q4H  . ipratropium-albuterol  3 mL Nebulization Q6H  . mouth rinse  15 mL Mouth Rinse QID  . pantoprazole (PROTONIX) IV  40 mg Intravenous QHS  . QUEtiapine  50 mg Oral BID  . silver sulfADIAZINE   Topical Daily  . sodium chloride HYPERTONIC  4 mL Nebulization TID  . thiamine injection  100 mg Intravenous Daily   Continuous Infusions: . sodium chloride    . dexmedetomidine (PRECEDEX) IV infusion 2 mcg/kg/hr (05/09/17 0856)  . DOBUTamine 1.5 mcg/kg/min (05/08/17 1430)  . feeding supplement (VITAL AF 1.2 CAL) 1,000 mL (05/08/17 2004)  . fentaNYL infusion INTRAVENOUS 100 mcg/hr (05/08/17 2349)  . heparin 1,400 Units/hr (05/08/17 2103)  . heparin    . meropenem (MERREM) IV Stopped (05/08/17 2341)  . norepinephrine (LEVOPHED) Adult infusion Stopped (05/06/17 2214)   PRN Meds:.Place/Maintain arterial line **AND** sodium chloride, acetaminophen, docusate, fentaNYL, heparin, heparin, midazolam  Current Labs: reviewed    Physical Exam:  Blood pressure (!) 138/56, pulse 85, temperature 99.2 F (37.3 C), temperature source Oral, resp. rate (!) 29, height 5' 10"  (1.778 m), weight 86.3 kg (190 lb 4.1 oz), SpO2 100 %. Chronically ill appearing Sedated /  intubated Tachy, regular Coarse bs b/l Ulcers / sores of b/l feet Temp HD cath R IJ  A 1. 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 B/l DVT and presumed PE on heparin 4. RV Failure now off dobutamin 5. Anemia 6. VDRF 7. Homelessness 8. Sepsis with UTI and Acinetobacter bacteremia meropenem 9. Presence of temporary dialysis catheter 10. B/l sores/ulcers of feet/ frostbite   P 1. No dialysis indicated today; adequat UOP w/o diuretics 2. Follow fever w/u given he has temp HD cath, might need line holiday   Pearson Grippe MD 05/09/2017, 8:57 AM  Recent Labs  Lab 05/08/17 0535 05/08/17 1600 05/09/17 0608  NA 139 139 139  K 4.6 4.6 4.6  CL 103 104 106  CO2 23 23 19*  GLUCOSE 212* 151* 221*  BUN 69* 78* 91*  CREATININE 4.22* 4.84* 5.42*  CALCIUM 7.4* 7.6* 7.5*  PHOS 5.2* 6.6* 7.0*   Recent Labs  Lab 05/07/17 0334 05/08/17 0420 05/09/17 0608  WBC 23.4* 22.7* 19.2*  HGB 9.4* 8.6* 7.9*  HCT 29.3* 27.3* 25.0*  MCV 90.4 90.7 89.6  PLT 95* 188 148*

## 2017-05-10 ENCOUNTER — Encounter (HOSPITAL_COMMUNITY): Payer: Self-pay | Admitting: Interventional Radiology

## 2017-05-10 ENCOUNTER — Inpatient Hospital Stay (HOSPITAL_COMMUNITY): Payer: Medicare Other

## 2017-05-10 HISTORY — PX: IR IVC FILTER PLMT / S&I /IMG GUID/MOD SED: IMG701

## 2017-05-10 LAB — COOXEMETRY PANEL
CARBOXYHEMOGLOBIN: 1.5 % (ref 0.5–1.5)
Carboxyhemoglobin: 1 % (ref 0.5–1.5)
Carboxyhemoglobin: 1.1 % (ref 0.5–1.5)
METHEMOGLOBIN: 0.7 % (ref 0.0–1.5)
Methemoglobin: 1.1 % (ref 0.0–1.5)
Methemoglobin: 1 % (ref 0.0–1.5)
O2 SAT: 63.3 %
O2 SAT: 61.2 %
O2 SAT: 70.5 %
TOTAL HEMOGLOBIN: 8.2 g/dL — AB (ref 12.0–16.0)
Total hemoglobin: 8.1 g/dL — ABNORMAL LOW (ref 12.0–16.0)
Total hemoglobin: 7.3 g/dL — ABNORMAL LOW (ref 12.0–16.0)

## 2017-05-10 LAB — GLUCOSE, CAPILLARY
GLUCOSE-CAPILLARY: 113 mg/dL — AB (ref 65–99)
GLUCOSE-CAPILLARY: 145 mg/dL — AB (ref 65–99)
Glucose-Capillary: 134 mg/dL — ABNORMAL HIGH (ref 65–99)
Glucose-Capillary: 205 mg/dL — ABNORMAL HIGH (ref 65–99)
Glucose-Capillary: 115 mg/dL — ABNORMAL HIGH (ref 65–99)
Glucose-Capillary: 95 mg/dL (ref 65–99)

## 2017-05-10 LAB — CBC
HEMATOCRIT: 24 % — AB (ref 39.0–52.0)
Hemoglobin: 7.7 g/dL — ABNORMAL LOW (ref 13.0–17.0)
MCH: 29.1 pg (ref 26.0–34.0)
MCHC: 32.1 g/dL (ref 30.0–36.0)
MCV: 90.6 fL (ref 78.0–100.0)
Platelets: 169 10*3/uL (ref 150–400)
RBC: 2.65 MIL/uL — ABNORMAL LOW (ref 4.22–5.81)
RDW: 14.9 % (ref 11.5–15.5)
WBC: 16.5 10*3/uL — ABNORMAL HIGH (ref 4.0–10.5)

## 2017-05-10 LAB — CULTURE, RESPIRATORY W GRAM STAIN: Culture: NO GROWTH

## 2017-05-10 LAB — COMPREHENSIVE METABOLIC PANEL
ALBUMIN: 1.4 g/dL — AB (ref 3.5–5.0)
ALK PHOS: 103 U/L (ref 38–126)
ALT: 120 U/L — AB (ref 17–63)
AST: 68 U/L — ABNORMAL HIGH (ref 15–41)
Anion gap: 12 (ref 5–15)
BUN: 113 mg/dL — ABNORMAL HIGH (ref 6–20)
CALCIUM: 7.6 mg/dL — AB (ref 8.9–10.3)
CO2: 19 mmol/L — ABNORMAL LOW (ref 22–32)
CREATININE: 6.03 mg/dL — AB (ref 0.61–1.24)
Chloride: 110 mmol/L (ref 101–111)
GFR calc non Af Amer: 9 mL/min — ABNORMAL LOW (ref >60)
GFR, EST AFRICAN AMERICAN: 10 mL/min — AB (ref >60)
GLUCOSE: 189 mg/dL — AB (ref 65–99)
Potassium: 5.1 mmol/L (ref 3.5–5.1)
Sodium: 141 mmol/L (ref 135–145)
Total Bilirubin: 0.4 mg/dL (ref 0.3–1.2)
Total Protein: 6.1 g/dL — ABNORMAL LOW (ref 6.5–8.1)

## 2017-05-10 LAB — HEPARIN LEVEL (UNFRACTIONATED): HEPARIN UNFRACTIONATED: 0.38 [IU]/mL (ref 0.30–0.70)

## 2017-05-10 LAB — PROCALCITONIN: Procalcitonin: 4.78 ng/mL

## 2017-05-10 LAB — CULTURE, RESPIRATORY

## 2017-05-10 LAB — PROTIME-INR
INR: 1.21
Prothrombin Time: 15.2 seconds (ref 11.4–15.2)

## 2017-05-10 MED ORDER — ACETAMINOPHEN 160 MG/5ML PO SOLN
650.0000 mg | Freq: Four times a day (QID) | ORAL | Status: DC | PRN
  Administered 2017-05-10 – 2017-06-06 (×15): 650 mg
  Filled 2017-05-10 (×16): qty 20.3

## 2017-05-10 MED ORDER — CHLORHEXIDINE GLUCONATE 0.12% ORAL RINSE (MEDLINE KIT)
15.0000 mL | Freq: Two times a day (BID) | OROMUCOSAL | Status: DC
  Administered 2017-05-10 – 2017-06-09 (×54): 15 mL via OROMUCOSAL

## 2017-05-10 MED ORDER — LIDOCAINE HCL 1 % IJ SOLN
INTRAMUSCULAR | Status: AC
  Administered 2017-05-10: 14:00:00
  Filled 2017-05-10: qty 20

## 2017-05-10 MED ORDER — LIDOCAINE HCL (PF) 1 % IJ SOLN
INTRAMUSCULAR | Status: DC | PRN
  Administered 2017-05-10: 8 mL

## 2017-05-10 MED ORDER — ORAL CARE MOUTH RINSE
15.0000 mL | OROMUCOSAL | Status: DC
  Administered 2017-05-10 – 2017-05-12 (×16): 15 mL via OROMUCOSAL

## 2017-05-10 MED ORDER — FUROSEMIDE 10 MG/ML IJ SOLN
80.0000 mg | Freq: Two times a day (BID) | INTRAMUSCULAR | Status: DC
  Administered 2017-05-10 – 2017-05-12 (×6): 80 mg via INTRAVENOUS
  Filled 2017-05-10 (×8): qty 8

## 2017-05-10 MED ORDER — IOPAMIDOL (ISOVUE-300) INJECTION 61%
INTRAVENOUS | Status: AC
  Administered 2017-05-10: 14:00:00
  Filled 2017-05-10: qty 150

## 2017-05-10 NOTE — Progress Notes (Signed)
Called by nursing that patient worsening hypotension with starting of iHD req resumed NE gtt.    Will stop HD, reassess in AM; likely would need to resume CRRT at that time if needed.

## 2017-05-10 NOTE — Progress Notes (Addendum)
Admit: 05/01/2017 LOS: 9  28M with AKI now off CRRT after likely massive PE, shock, bacteremia  Subjective:  1.9L UOP yesterday AM Labs pending Persistent fevers Transiently on NE overnight, stopped this AM Lasix 80 IV BID  02/21 0701 - 02/22 0700 In: 2698.7 [I.V.:873.7; NG/GT:1825] Out: 1975 [PYKDX:8338; Stool:100]  Filed Weights   05/08/17 0500 05/09/17 0418 05/10/17 0500  Weight: 85.5 kg (188 lb 7.9 oz) 86.3 kg (190 lb 4.1 oz) 87.9 kg (193 lb 12.6 oz)    Scheduled Meds: . aspirin  81 mg Per Tube Daily  . chlorhexidine gluconate (MEDLINE KIT)  15 mL Mouth Rinse BID  . clonazepam  0.5 mg Oral BID  . free water  100 mL Per Tube Q6H  . furosemide  80 mg Intravenous BID  . insulin aspart  2-6 Units Subcutaneous Q4H  . ipratropium-albuterol  3 mL Nebulization Q6H  . mouth rinse  15 mL Mouth Rinse QID  . pantoprazole (PROTONIX) IV  40 mg Intravenous QHS  . QUEtiapine  100 mg Oral BID  . silver sulfADIAZINE   Topical Daily  . sodium chloride HYPERTONIC  4 mL Nebulization TID  . thiamine injection  100 mg Intravenous Daily   Continuous Infusions: . sodium chloride    . dexmedetomidine (PRECEDEX) IV infusion 1.6 mcg/kg/hr (05/10/17 0753)  . feeding supplement (VITAL AF 1.2 CAL) 1,000 mL (05/10/17 0753)  . fentaNYL infusion INTRAVENOUS 50 mcg/hr (05/10/17 0753)  . heparin 1,400 Units/hr (05/10/17 0754)  . meropenem (MERREM) IV Stopped (05/09/17 2351)  . norepinephrine (LEVOPHED) Adult infusion 4 mcg/min (05/10/17 0659)   PRN Meds:.Place/Maintain arterial line **AND** sodium chloride, acetaminophen (TYLENOL) oral liquid 160 mg/5 mL, docusate, fentaNYL, midazolam  Current Labs: reviewed    Physical Exam:  Blood pressure (!) 114/59, pulse 79, temperature (!) 101.3 F (38.5 C), temperature source Oral, resp. rate (!) 21, height _0  (1.778 m), weight 87.9 kg (193 lb 12.6 oz), SpO2 100 %. Chronically ill appearing Sedated / intubated Tachy, regular Coarse bs b/l Ulcers /  sores of b/l feet Temp HD cath R IJ  A 1. 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 B/l DVT and presumed PE on heparin 4. RV Failure now off dobutamin 5. Anemia 6. VDRF 7. Homelessness 8. Sepsis with UTI and Acinetobacter bacteremia meropenem 9. Presence of temporary dialysis catheter 10. B/l sores/ulcers of feet/ frostbite  P 1. Await AM Labs for HD decision; adequate UOP on diuretics  UDPATE: Labs with worsenined renal function: plan iHD today, Qb 350, 3L UF, 2K bath, TDC.   Pearson Grippe MD 05/10/2017, 8:05 AM  Recent Labs  Lab 05/08/17 0535 05/08/17 1600 05/09/17 0608  NA 139 139 139  K 4.6 4.6 4.6  CL 103 104 106  CO2 23 23 19*  GLUCOSE 212* 151* 221*  BUN 69* 78* 91*  CREATININE 4.22* 4.84* 5.42*  CALCIUM 7.4* 7.6* 7.5*  PHOS 5.2* 6.6* 7.0*   Recent Labs  Lab 05/08/17 0420 05/09/17 0608 05/10/17 0430  WBC 22.7* 19.2* 16.5*  HGB 8.6* 7.9* 7.7*  HCT 27.3* 25.0* 24.0*  MCV 90.7 89.6 90.6  PLT 188 148* 169

## 2017-05-10 NOTE — Progress Notes (Addendum)
Advanced Heart Failure Rounding Note  PCP-Cardiologist: Skeet Latch, MD   Subjective:    Remains intubated and sedated. Failed vent wean. Tmax 102 Suspect combination of tissue death and infection. On meropenem   Weight up 3 lbs. 1.8 L UOP yesterday with IV lasix. Creatinine and BUN have been trending up off CVVHD. Cr now 5.4  Co-ox 61%. On low-dose NE.    Objective:   Weight Range: 193 lb 12.6 oz (87.9 kg) Body mass index is 27.81 kg/m.   Vital Signs:   Temp:  [98.3 F (36.8 C)-101.4 F (38.6 C)] 101.3 F (38.5 C) (02/22 0350) Pulse Rate:  [68-86] 79 (02/22 0400) Resp:  [18-29] 21 (02/22 0400) BP: (92-138)/(37-63) 114/59 (02/22 0400) SpO2:  [97 %-100 %] 100 % (02/22 0400) Arterial Line BP: (90-146)/(38-58) 121/50 (02/22 0400) FiO2 (%):  [30 %] 30 % (02/22 0400) Weight:  [193 lb 12.6 oz (87.9 kg)] 193 lb 12.6 oz (87.9 kg) (02/22 0500) Last BM Date: 05/09/17  Weight change: Filed Weights   05/08/17 0500 05/09/17 0418 05/10/17 0500  Weight: 188 lb 7.9 oz (85.5 kg) 190 lb 4.1 oz (86.3 kg) 193 lb 12.6 oz (87.9 kg)    Intake/Output:   Intake/Output Summary (Last 24 hours) at 05/10/2017 0741 Last data filed at 05/10/2017 0700 Gross per 24 hour  Intake 2698.7 ml  Output 1975 ml  Net 723.7 ml      Physical Exam  CVP 8-9 cm  General: Intubated/sedated.  HEENT: + ETT. Gaunt. Edentulous.  Neck:  JVP ~8. Carotids 2+ bilat; no bruits. No thyromegaly or nodule noted. Cor: PMI nondisplaced. RRR, 2/6 TR.  Lungs: Diminished basilar sounds. + Mechanical breathing sounds.  Abdomen: Soft, mildly distended.No HSM. No bruits or masses. +BS  Extremities: Exam limited by 2+ edema with gangrenous changes to bilateral extremities.   Neuro: Intubated/sedated   Telemetry   Sinus 80s, personally reviewed.   EKG    No new tracings.    Labs    CBC Recent Labs    05/09/17 0608 05/10/17 0430  WBC 19.2* 16.5*  HGB 7.9* 7.7*  HCT 25.0* 24.0*  MCV 89.6 90.6    PLT 148* 882   Basic Metabolic Panel Recent Labs    05/08/17 0535 05/08/17 1600 05/09/17 0608  NA 139 139 139  K 4.6 4.6 4.6  CL 103 104 106  CO2 23 23 19*  GLUCOSE 212* 151* 221*  BUN 69* 78* 91*  CREATININE 4.22* 4.84* 5.42*  CALCIUM 7.4* 7.6* 7.5*  MG 2.4  --  2.4  PHOS 5.2* 6.6* 7.0*   Liver Function Tests Recent Labs    05/08/17 1600 05/09/17 0608  ALBUMIN 1.4* 1.3*   No results for input(s): LIPASE, AMYLASE in the last 72 hours. Cardiac Enzymes Recent Labs    05/08/17 0535  CKTOTAL 1,102*    BNP: BNP (last 3 results) Recent Labs    05/01/17 1954  BNP 208.4*    ProBNP (last 3 results) No results for input(s): PROBNP in the last 8760 hours.   D-Dimer No results for input(s): DDIMER in the last 72 hours. Hemoglobin A1C No results for input(s): HGBA1C in the last 72 hours. Fasting Lipid Panel No results for input(s): CHOL, HDL, LDLCALC, TRIG, CHOLHDL, LDLDIRECT in the last 72 hours. Thyroid Function Tests No results for input(s): TSH, T4TOTAL, T3FREE, THYROIDAB in the last 72 hours.  Invalid input(s): FREET3  Other results:   Imaging    Dg Chest [facility] 1 View  Result  Date: 05/10/2017 CLINICAL DATA:  67 year old male admitted after found down outside with hypothermia. Lactic acidosis, bilateral lower extremity DVT, and bilateral frostbite. Intubated. EXAM: PORTABLE CHEST 1 VIEW COMPARISON:  05/09/2017 and earlier. FINDINGS: Portable AP semi upright view at 0411 hrs. The endotracheal tube tip projects about 12 millimeters from the carina. Stable bilateral IJ central lines. Enteric tube courses to the abdomen, side holes at the level of the gastric cardia. Continued somewhat low lung volumes. Streaky opacity at both lung bases most resembles atelectasis. There could be superimposed small pleural effusions. Normal pulmonary vascularity. No pneumothorax. Mediastinal contours remain normal. Negative visible bowel gas. IMPRESSION: 1. Endotracheal tube tip  12 mm above the carina. Otherwise stable lines and tubes. 2. Stable ventilation. Low lung volumes with bibasilar atelectasis and possible small pleural effusions. Electronically Signed   By: Genevie Ann M.D.   On: 05/10/2017 06:42     Medications:     Scheduled Medications: . aspirin  81 mg Per Tube Daily  . chlorhexidine gluconate (MEDLINE KIT)  15 mL Mouth Rinse BID  . clonazepam  0.5 mg Oral BID  . free water  100 mL Per Tube Q6H  . furosemide  80 mg Intravenous BID  . insulin aspart  2-6 Units Subcutaneous Q4H  . ipratropium-albuterol  3 mL Nebulization Q6H  . mouth rinse  15 mL Mouth Rinse QID  . pantoprazole (PROTONIX) IV  40 mg Intravenous QHS  . QUEtiapine  100 mg Oral BID  . silver sulfADIAZINE   Topical Daily  . sodium chloride HYPERTONIC  4 mL Nebulization TID  . thiamine injection  100 mg Intravenous Daily    Infusions: . sodium chloride    . dexmedetomidine (PRECEDEX) IV infusion 1.8 mcg/kg/hr (05/10/17 0645)  . feeding supplement (VITAL AF 1.2 CAL) 1,000 mL (05/10/17 0700)  . fentaNYL infusion INTRAVENOUS 75 mcg/hr (05/09/17 2020)  . heparin 1,400 Units/hr (05/09/17 1547)  . meropenem (MERREM) IV Stopped (05/09/17 2351)  . norepinephrine (LEVOPHED) Adult infusion 4 mcg/min (05/10/17 0659)    PRN Medications: Place/Maintain arterial line **AND** sodium chloride, acetaminophen (TYLENOL) oral liquid 160 mg/5 mL, docusate, fentaNYL, midazolam    Patient Profile   71M with homelessness and no known past medical history here after being found down and hypothermic. He was initially hypotensive and tachycardic and had a leukocytosis. CXR concerning for CAP, urine culture positive for Staph lugdeunensis.Troponin was elevated to 2.8. He was improving and then became responsive and subsequently developed hypoxic respiratory failure requiring intubation. He was found to have extensive lower extremity DVTs and presumptively PE (no CT due to AKI). Echo positive for RV  failure and McConnell's sign. Hospitalization complicated by AKI and oliguria.  Assessment/Plan  1. Acute RV failure/cor pulmonale - echo LVEF 65-70% with severe RV HK - Likely massive PE (has extensive DVT on LE u/s). No CT with AKI. Continue heparin  - Coox 63.3% on norepi at 4 mcg/min. Now off dobutamine.  - Situation complicated by likely sepsis - CVP 8-9 this am. Will gently diurese with 80 mg IV lasix BID.  - Repeat echo 05/09/17 with LVEF 55-60% and RV improved. (mildly reduced EF)  2. Shock - likely mixed cardiogenic (RV) and septic - Todays CO-OX is 63.3% on dobutamine 2 mcg.   - Management as per above - 4. Elevated troponin - Due to PE and RV strain. ECG ok no evidence of regional LV wall motionaabnormality to suggest ACS.  - No change.   5. Acute respiratory failure -  Due to #1. Also PNA on CXR - Remains on vent and abx per CCM.   6. ARF - Due to shock and rhabdo on CVVHD 2/17 -> 2/19. - Creatinine pending this am. Will give IV lasix, but may need CVVHD to filter.  - Nephrology managing.    7. Sepsis - Staph lugdunensis in urine (with elevated PSA (42) suggestive of possible prostate CA) - ACINETOBACTER LWOFFII in blood  - WBC down to 16.5. Tmax 101.4. Suspect at least partially due to tissue necrosis in LEs.  - ABX per CCM. Now on meropenem.   8. Frostbite with LE skin loss - WOC following. Appreciate care.   9. Shock liver - Remains elevated.   10. Homelessness - May have poor end point. Should discuss goals of care with family.   Patient is critically ill and in multisystem organ failure. Goals of care should be addressed with any available family with very poor end point. Very possible he will lose his legs, and pt is homeless.   Length of Stay: [place], [place]  05/10/2017, 7:41 AM  Advanced Heart Failure Team Pager [phone] (M-F; 7a - 4p)  Please contact [facility] for night-coverage after hours (4p -7a ) and weekends on  [email or web address]  Agree with above.   He remains critically ill. Intubated and sedated. Failed vent wean yesterday. Making urine but creatinine going back up. CVP 8-10 on low-dose NE. Echo reviewed personally. EF 60% RV function improving now only mild HK. Remains febrile despite meropenem. Gangrenous changes to feet  Intubated/sedated RIJ cath CVP up Cor RRR 2/6 TR Lungs coarse Ab mildly distended soft NT Ext 2+ edema gangrenous changes  Hemodynamically improved with only mild residual RV stain on echo. Continue to wean pressors. Will restart IV lasix as CVP going back up. May need HD back.   Given LE gangrene, AKI and failure to wean from vent ad social situation need to consider palliative care involvement. I will see again Monday,   CRITICAL CARE Performed by: Glori Bickers  Total critical care time: 35 minutes  Critical care time was exclusive of separately billable procedures and treating other patients.  Critical care was necessary to treat or prevent imminent or life-threatening deterioration.  Critical care was time spent personally by me (independent of midlevel providers or residents) on the following activities: development of treatment plan with patient and/or surrogate as well as nursing, discussions with consultants, evaluation of patient's response to treatment, examination of patient, obtaining history from patient or surrogate, ordering and performing treatments and interventions, ordering and review of laboratory studies, ordering and review of radiographic studies, pulse oximetry and re-evaluation of patient's condition.  Glori Bickers, MD  9:07 AM

## 2017-05-10 NOTE — Progress Notes (Signed)
Nutrition Follow-up  DOCUMENTATION CODES:   Non-severe (moderate) malnutrition in context of social or environmental circumstances  INTERVENTION:    Continue Vital AF 1.2 at 75 ml/h to provide 2160 kcal, 135 gm protein, 1460 ml free water daily  NUTRITION DIAGNOSIS:   Moderate Malnutrition related to social / environmental circumstances as evidenced by mild fat depletion, mild muscle depletion.  Ongoing  GOAL:   Patient will meet greater than or equal to 90% of their needs  Met with TF  MONITOR:   Vent status, TF tolerance, Labs, I & O's   ASSESSMENT:   67 yo male with no PMH who was admitted on 2/13 after being found unresponsive outside by police with hypothermia, hypotension, elevated lactic acid. Required intubation on admission. Found to have extensive bilateral DVTs.  Discussed patient in ICU rounds and with RN today. IVC filter being placed today. HD being resumed today. CRRT off. Patient is currently receiving Vital AF 1.2 via OGT at 75 ml/h to provide 2160 kcals, 135 gm protein, 1460 ml free water daily.  Tolerating TF well at goal rate. Patient remains intubated on ventilator support MV: 13 L/min Temp (24hrs), Avg:100.6 F (38.1 C), Min:98.9 F (37.2 C), Max:101.4 F (38.6 C)  Labs and medications reviewed. CBG's: 134-205 Potassium WNL, phosphorus 7 (H) 2/21  Diet Order:  Diet NPO time specified  EDUCATION NEEDS:   No education needs have been identified at this time  Skin:  Skin Assessment: Skin Integrity Issues: Skin Integrity Issues:: Stage II, Other (Comment) Stage II: buttocks Other: frost bite to both feet  Last BM:  2/22  Height:   Ht Readings from Last 1 Encounters:  05/06/17 _0  (1.778 m)    Weight:   Wt Readings from Last 1 Encounters:  05/10/17 193 lb 12.6 oz (87.9 kg)    Ideal Body Weight:  75.5 kg  BMI:  Body mass index is 27.81 kg/m.  Estimated Nutritional Needs:   Kcal:  2150  Protein:  125-150 gm  Fluid:   2.1 L   Molli Barrows, RD, LDN, [place] Pager [phone] After Hours Pager [phone]

## 2017-05-10 NOTE — Progress Notes (Signed)
RN notified of pt temp of 100.5 F orally.

## 2017-05-10 NOTE — Sedation Documentation (Signed)
Patient is resting comfortably. 

## 2017-05-10 NOTE — Procedures (Signed)
Interventional Radiology Procedure Note  Procedure: Placement of a potentially retrievable IVC filter via the right IJ.   Complications: None  Estimated Blood Loss: None  Recommendations: - Will follow for potential filter retrieval  Signed,  Sterling BigHeath K. Avanti Jetter, MD

## 2017-05-10 NOTE — Progress Notes (Signed)
PULMONARY / CRITICAL CARE MEDICINE   Name: Joseph FloridaLarry E Aispuro MRN: [id number] DOB: 23-Nov-1950    ADMISSION DATE:  05/01/2017 CONSULTATION DATE:  05/01/17  REFERRING MD:  Dr. Deretha EmoryZackowski  CHIEF COMPLAINT:  Found down  HISTORY OF PRESENT ILLNESS:        This is a 67 year old man who was found down in 40 degree weather. He is homeless. He was tachypneic and hypotensive as well as hypothermic on arrival with a lactic acid in excess of 7.  He received 3.5 L of crystalloid resuscitation along with vancomycin and Zosyn and was warmed with a bear hugger. He was alert and able to communicate in ED providing the following history: "He tells me that he had a syncopal episode last night but is a little hazy on the exact details.  He is not aware that he had any palpitations preceding that episode and he denies a prior history of seizure disorder.  He denies any new pains suggestive of an unprotected fall.  He denies use of street drugs or alcohol." Critical care service is asked to admit the patient because he still has a lactate acid in excess of 6 after being warmed and fluid resuscitated. Patient was initiated on bipap but would not tolerate mask. He was intubated shortly after admission.   SUBJECTIVE:  No acute changes. He was on levophed overnight but this was turned off this morning as BP was high. He is febrile this morning.   VITAL SIGNS: BP (!) 114/59   Pulse 89   Temp (!) 101.3 F (38.5 C) (Oral) Comment: notified RN Justin  Resp (!) 35   Ht 5\' 10"  (1.778 m)   Wt 193 lb 12.6 oz (87.9 kg)   SpO2 100%   BMI 27.81 kg/m   HEMODYNAMICS: CVP:  [68 mmHg] 68 mmHg  VENTILATOR SETTINGS: Vent Mode: PSV;CPAP FiO2 (%):  [30 %] 30 % Set Rate:  [18 bmp] 18 bmp Vt Set:  [580 mL] 580 mL PEEP:  [5 cmH20] 5 cmH20 Pressure Support:  [10 cmH20] 10 cmH20 Plateau Pressure:  [11 cmH20-19 cmH20] 17 cmH20  INTAKE / OUTPUT: I/O last 3 completed shifts: In: 5202.8 [I.V.:1942.8; NG/GT:3160; IV  Piggyback:100] Out: 2960 [Urine:2860; Stool:100]  PHYSICAL EXAMINATION: General: Ill-appearing man, lying in bed, sedated, intubated Neuro: sedated, intubated HEENT: ET tube in place, no oral lesions Cardiovascular: Regular, no murmur Lungs: Coarse bilateral breath sounds Abdomen: Soft, benign, positive bowel sounds Musculoskeletal: Bilateral lower extremity pitting edema, feet wrapped bilaterally  Skin: skin sloughing feet bilaterally  LABS:  BMET Recent Labs  Lab 05/08/17 0535 05/08/17 1600 05/09/17 0608  NA 139 139 139  K 4.6 4.6 4.6  CL 103 104 106  CO2 23 23 19*  BUN 69* 78* 91*  CREATININE 4.22* 4.84* 5.42*  GLUCOSE 212* 151* 221*    Electrolytes Recent Labs  Lab 05/07/17 0334 05/08/17 0535 05/08/17 1600 05/09/17 0608  CALCIUM 7.7* 7.4* 7.6* 7.5*  MG 2.4 2.4  --  2.4  PHOS 3.8 5.2* 6.6* 7.0*    CBC Recent Labs  Lab 05/08/17 0420 05/09/17 0608 05/10/17 0430  WBC 22.7* 19.2* 16.5*  HGB 8.6* 7.9* 7.7*  HCT 27.3* 25.0* 24.0*  PLT 188 148* 169    Coag's Recent Labs  Lab 05/10/17 0650  INR 1.21    Sepsis Markers No results for input(s): LATICACIDVEN, PROCALCITON, O2SATVEN in the last 168 hours.  ABG No results for input(s): PHART, PCO2ART, PO2ART in the last 168 hours.  Liver Enzymes Recent Labs  Lab 05/06/17 0516  05/07/17 0334 05/08/17 0535 05/08/17 1600 05/09/17 0608  AST 239*  --  177*  --   --   --   ALT 378*  --  326*  --   --   --   ALKPHOS 109  --  115  --   --   --   BILITOT 0.7  --  0.6  --   --   --   ALBUMIN 1.7*  1.7*   < > 1.7*  1.6* 1.5* 1.4* 1.3*   < > = values in this interval not displayed.    Cardiac Enzymes No results for input(s): TROPONINI, PROBNP in the last 168 hours.  Glucose Recent Labs  Lab 05/09/17 0808 05/09/17 1157 05/09/17 1541 05/09/17 2011 05/10/17 0010 05/10/17 0720  GLUCAP 176* 178* 130* 122* 113* 134*    Imaging Dg Chest Port 1 View  Result Date: 05/10/2017 CLINICAL DATA:   67 year old male admitted after found down outside with hypothermia. Lactic acidosis, bilateral lower extremity DVT, and bilateral frostbite. Intubated. EXAM: PORTABLE CHEST 1 VIEW COMPARISON:  05/09/2017 and earlier. FINDINGS: Portable AP semi upright view at 0411 hrs. The endotracheal tube tip projects about 12 millimeters from the carina. Stable bilateral IJ central lines. Enteric tube courses to the abdomen, side holes at the level of the gastric cardia. Continued somewhat low lung volumes. Streaky opacity at both lung bases most resembles atelectasis. There could be superimposed small pleural effusions. Normal pulmonary vascularity. No pneumothorax. Mediastinal contours remain normal. Negative visible bowel gas. IMPRESSION: 1. Endotracheal tube tip 12 mm above the carina. Otherwise stable lines and tubes. 2. Stable ventilation. Low lung volumes with bibasilar atelectasis and possible small pleural effusions. Electronically Signed   By: Odessa Fleming M.D.   On: 05/10/2017 06:42    STUDIES:  CXR 2/13 - no acute cardiopulmonary abnormality ECHO 2/14 - EF 65-70%, G1DD, RV strain CXR 2/22 - bibasilar atelectasis and small pleural effusions  Echo 2/21 - EF 55-60%, RV improved  CULTURES: MRSA PCR negative Ucx + staph lugdenensis Blood cx 2/13 >> Diphtheroids + Acinetobacter lwoffii Blood cx 2/19 >>NGTD Blood cx 2/20 >> NGTD  ANTIBIOTICS: Zosyn 2/13> 2/18 Vanc 2/13 >>2/19 Meropenem 2/18 >>  SIGNIFICANT EVENTS: 2/13 admitted, intubated  LINES/TUBES: ETT 2/13 >>  PIV Foley 2/13 OGT 2/13 Right IJ HD catheter 2/15 >> Left IJ CVC 2/15 >>   DISCUSSION: 67 year old man found poorly responsive outside with evidence for shock and hypothermia.  He was intubated for encephalopathy and hemodynamic instability.  He had a lactic acidosis and acute renal failure, positive troponin.  Evaluation has revealed bilateral lower extremity DVT, right heart strain by echocardiogram, probable PE.  He has  polymicrobial bacteremia, no evidence of valvular disease by echo, not yet speciated.  Also with Staph lugdunensis UTI (often associated with prostate cancer). PSA elevated. Progressive oliguric renal failure, now CVVHD. Acinetobacter growing in blood along w/ diphtheroids.   ASSESSMENT / PLAN:  PULMONARY A: Acute respiratory failure on ventilator Likely pulmonary embolism with associated right heart strain  P:   Continue current ventilator support as we stabilize hemodynamics, metabolic status, renal function. Continue heparin infusion  CARDIOVASCULAR A:  Stress related non-ST elevation MI, suspect right heart strain Cor pulmonale Cardiogenic shock.  P:  Appreciate cardiology input Continue diuresis  F/u on Co-ox q8 hours He will likely ultimately require an ischemic evaluation this hospitalization once stable  RENAL A:   Acute oliguric renal failure-improving. UOP ~2L Hyperkalemia- resolved Lactic acidosis-  resolved Rhabdomyolysis-improving P:   Nephrology following, appreciate recommendations Renal function worsened off CRRT Follow BMP, urine output Continue IV lasix  GASTROINTESTINAL A:   NPO Elevated LFTs, suspect shock liver P:   Continue PPI Continue tube feeding Follow LFTs  HEMATOLOGIC / ONC A:   Bilateral DVTs Leukocytosis Anemia Elevated PSA, Staph lugdunensis both suggestive of possible prostatic CA Thrombocytopenia- 106 > 95 > 188 P:  Continue heparin drip Follow CBC Transfusion threshold < 7 He will need further evaluation for possible prostate cancer once he stabilizes from his critical illness  INFECTIOUS A:   RLL infiltrate on CXR UTI-  >100,000 colonies staph lugdenenis s/p 7 days of vanc Acinetobacter lwoffii bacteremia. P:   continue meropenem for acinetobacter Trach aspirate and repeat blood cx NGTD Wound care for his bilateral foot wounds Trend WBC, fevers  ENDOCRINE A:   hyperglycemia P:   Follow CBG Sliding scale  insulin per protocol  WOUND CARE A: Bilateral feet with evidence for skin sloughing P: Continue current dressing changes Vascular surgery following, possible arteriogram next week if stable  NEUROLOGIC A:   Toxic metabolic encephalopathy Sedation for ventilator tolerance P:   RASS goal: -1 Off fentanyl, on Precedex Continue klonopin 0.5 bid titrate prn Continue seroquel 100 mg BID for agitation  Versed prn  Wean sedation as we are able  FAMILY  - Updates: updated emergency contact 2/20 - Inter-disciplinary family meet or Palliative Care meeting due by:  2/20    Dolores Patty, DO PGY-2, [facility] 05/10/2017 8:31 AM

## 2017-05-10 NOTE — Sedation Documentation (Signed)
Pt  Is sedated and intubated. RRT bedside

## 2017-05-10 NOTE — Progress Notes (Signed)
Patient in IR will begin breathing treatment when patient returns.

## 2017-05-10 NOTE — Progress Notes (Signed)
Pt was starting HD treatment 40 mins. Bp reading 65/45 from arterial line . Pt not tolerate the tx. Dr Marisue HumbleSanford notified. Order received as follow: terminated treatment. Blood return.Pt BP reading 118/49.

## 2017-05-10 NOTE — Progress Notes (Signed)
ANTICOAGULATION CONSULT NOTE - Follow- Up Consult  Pharmacy Consult for Heparin  Indication: chest pain/ACS / new BL DVTs/possible PE  No Known Allergies  Patient Measurements: Height: 5\' 10"  (177.8 cm) Weight: 193 lb 12.6 oz (87.9 kg) IBW/kg (Calculated) : 73  Vital Signs: Temp: 101.3 F (38.5 C) (02/22 0350) Temp Source: Oral (02/22 0350) BP: 114/59 (02/22 0400) Pulse Rate: 79 (02/22 0400)  Labs: Recent Labs    05/08/17 0420 05/08/17 0535 05/08/17 1600 05/09/17 0607 05/09/17 0608 05/10/17 0430 05/10/17 0650  HGB 8.6*  --   --   --  7.9* 7.7*  --   HCT 27.3*  --   --   --  25.0* 24.0*  --   PLT 188  --   --   --  148* 169  --   LABPROT  --   --   --   --   --   --  15.2  INR  --   --   --   --   --   --  1.21  HEPARINUNFRC  --  0.31  --  0.43  --  0.38  --   CREATININE  --  4.22* 4.84*  --  5.42*  --   --   CKTOTAL  --  1,102*  --   --   --   --   --     Estimated Creatinine Clearance: 15 mL/min (A) (by C-G formula based on SCr of 5.42 mg/dL (H)).  Assessment: [name] in the ICU after being "found down".Patient has been found to have bilateral lower extremity with concern for possible PE.  Heparin level today remains therapeutic at 0.38 on 1400 units/hr.. H/H down slightly. Platelets 169. No signs of bleeding have been noted.   Goal of Therapy:  Heparin level 0.3-0.7 units/ml Monitor platelets by anticoagulation protocol: Yes   Plan:  Continue heparin at 1400 units/hr  Daily heparin level and CBC Follow for long term plans for anticoagulation  Sharin MonsEmily Barlow Harrison, PharmD, BCPS PGY2 Infectious Diseases Pharmacy Resident Pager: [phone]  05/10/2017 7:45 AM

## 2017-05-10 NOTE — Consult Note (Signed)
Chief Complaint: Patient was seen in consultation today for inferior vena cava filter placement Chief Complaint  Patient presents with  . Cold Exposure   at the request of Dr Vassie Loll   Supervising Physician: Malachy Moan  Patient Status: [facility] - In-pt  History of Present Illness: Joseph Downs is a 67 y.o. male   Homeless man Found down 05/01/17 To ED Tachy; hypotensive; hypothermic Lactic acid over 7- shock Warmed with bearhugger  Intubated for encephalopathy Acute renal failure NSTEMI +Bacteremia; UTI- staph lugdunensis PSA elevated  Bilat DVTs; echo shows Rt heart strain Now on Heparin drip  Bilateral feet dry gangrene Fever Leukocytosis  Dr Vassie Loll note: Bilateral DVT/presumed massive PE with RV strain-hypotension resolved, continue IV heparin, dobutamine off, will ask IR to place IVC filter since expect prolonged immobility  Dr Archer Asa and Dr Vassie Loll have spoken Dr Archer Asa approves IVC filter placement  No past medical history on file.  No past surgical history on file.  Allergies: Patient has no known allergies.  Medications: Prior to Admission medications   Medication Sig Start Date End Date Taking? Authorizing Provider  finasteride (PROSCAR) 5 MG tablet Take 5 mg by mouth daily.   Yes [provider]  tamsulosin (FLOMAX) 0.4 MG CAPS capsule Take 1 capsule (0.4 mg total) by mouth daily after supper. Patient not taking: Reported on 05/01/2017 03/26/17   Vassie Loll, MD     Family History  Problem Relation Age of Onset  . Cancer Father     Social History   Socioeconomic History  . Marital status: Married    Spouse name: Not on file  . Number of children: Not on file  . Years of education: Not on file  . Highest education level: Not on file  Social Needs  . Financial resource strain: Not on file  . Food insecurity - worry: Not on file  . Food insecurity - inability: Not on file  . Transportation needs - medical: Not on file   . Transportation needs - non-medical: Not on file  Occupational History  . Not on file  Tobacco Use  . Smoking status: Never Smoker  . Smokeless tobacco: Never Used  Substance and Sexual Activity  . Alcohol use: No  . Drug use: No  . Sexual activity: Not on file  Other Topics Concern  . Not on file  Social History Narrative  . Not on file    Review of Systems: A 12 point ROS discussed and pertinent positives are indicated in the HPI above.  All other systems are negative.  Review of Systems  Constitutional: Positive for activity change and fever.  Respiratory:       Vent  Neurological:       Intubated No response    Vital Signs: BP (!) 95/56   Pulse 85   Temp (!) 101.3 F (38.5 C) (Oral) Comment: notified RN Justin  Resp (!) 22   Ht 5\' 10"  (1.778 m)   Wt 193 lb 12.6 oz (87.9 kg)   SpO2 100%   BMI 27.81 kg/m   Physical Exam  Cardiovascular: Normal rate and regular rhythm.  Skin: Skin is warm and dry.  Psychiatric:  Consented wife via phone  Nursing note and vitals reviewed.   Imaging: Dg Chest Port 1 View  Result Date: 05/10/2017 CLINICAL DATA:  67 year old male admitted after found down outside with hypothermia. Lactic acidosis, bilateral lower extremity DVT, and bilateral frostbite. Intubated. EXAM: PORTABLE CHEST 1 VIEW COMPARISON:  05/09/2017 and earlier.  FINDINGS: Portable AP semi upright view at 0411 hrs. The endotracheal tube tip projects about 12 millimeters from the carina. Stable bilateral IJ central lines. Enteric tube courses to the abdomen, side holes at the level of the gastric cardia. Continued somewhat low lung volumes. Streaky opacity at both lung bases most resembles atelectasis. There could be superimposed small pleural effusions. Normal pulmonary vascularity. No pneumothorax. Mediastinal contours remain normal. Negative visible bowel gas. IMPRESSION: 1. Endotracheal tube tip 12 mm above the carina. Otherwise stable lines and tubes. 2. Stable  ventilation. Low lung volumes with bibasilar atelectasis and possible small pleural effusions. Electronically Signed   By: Odessa Fleming M.D.   On: 05/10/2017 06:42   Dg Chest Port 1 View  Result Date: 05/09/2017 CLINICAL DATA:  Intubation. EXAM: PORTABLE CHEST 1 VIEW COMPARISON:  05/08/2017. FINDINGS: Endotracheal tube tip approximately 9 mm above the carina. Proximal repositioning of approximately 2 cm should be considered. NG tube noted with tip below left hemidiaphragm. Bilateral IJ lines noted stable position. Progressive bibasilar atelectasis. Small bilateral pleural effusions. No pneumothorax. Heart size normal. IMPRESSION: 1. Endotracheal tube tip approximately 9 mm above the carina. Proximal repositioning of approximately 2 cm should be considered. Remaining lines and tubes in stable position. 2. Progressive bibasilar atelectasis. Small bilateral pleural effusions. Electronically Signed   By: Maisie Fus  Register   On: 05/09/2017 06:27   Dg Chest Port 1 View  Result Date: 05/08/2017 CLINICAL DATA:  Hypoxia EXAM: PORTABLE CHEST 1 VIEW COMPARISON:  May 07, 2017 FINDINGS: Endotracheal tube tip is 1.5 cm above the carina. Central catheter tips are in the superior vena cava. Nasogastric tube tip and side port are in the stomach. No pneumothorax. There is patchy bibasilar atelectasis. There is a minimal right pleural effusion. Heart is upper normal in size with pulmonary vascularity within normal limits. No adenopathy. No bone lesions. IMPRESSION: Tube and catheter positions as described without pneumothorax. Note that the endotracheal tube tip is fairly close to the carina. It may be prudent to consider withdrawing endotracheal tube approximately 2 cm. Bibasilar atelectasis. Minimal right pleural effusion. No frank consolidation. Stable cardiac silhouette. Electronically Signed   By: Bretta Bang III M.D.   On: 05/08/2017 08:58   Dg Chest Port 1 View  Result Date: 05/07/2017 CLINICAL DATA:  Shortness  of breath, intubated EXAM: PORTABLE CHEST 1 VIEW COMPARISON:  05/06/2017 FINDINGS: Support devices are stable. Bilateral lower lobe atelectasis or infiltrates with layering effusions. Basilar aeration slightly worsened since prior study. Mild cardiomegaly. IMPRESSION: Worsening bibasilar aeration with increasing small effusions and bibasilar atelectasis. Electronically Signed   By: Charlett Nose M.D.   On: 05/07/2017 07:52   Dg Chest Port 1 View  Result Date: 05/06/2017 CLINICAL DATA:  67 year old male with history of acute respiratory failure. EXAM: PORTABLE CHEST 1 VIEW COMPARISON:  Chest x-ray 05/05/2017. FINDINGS: An endotracheal tube is in place with tip 2.4 cm above the carina. There is a right-sided internal jugular central venous catheter with tip terminating in the distal superior vena cava. There is a left-sided internal jugular central venous catheter with tip terminating in the mid superior vena cava. A nasogastric tube is seen extending into the stomach, however, the tip of the nasogastric tube extends below the lower margin of the image. Lung volumes are low. Bibasilar opacities are favored to reflect areas of subsegmental atelectasis. Probable trace bilateral pleural effusions. No definite consolidative airspace disease. No pneumothorax. No evidence of pulmonary edema. Heart size is normal. Upper mediastinal contours are within  normal limits. IMPRESSION: 1. Support apparatus, as above. 2. Low lung volumes with bibasilar subsegmental atelectasis and probable trace bilateral pleural effusions. Electronically Signed   By: Trudie Reed M.D.   On: 05/06/2017 06:59   Dg Chest Port 1 View  Result Date: 05/05/2017 CLINICAL DATA:  Acute respiratory failure. EXAM: PORTABLE CHEST 1 VIEW COMPARISON:  05/04/2017 FINDINGS: 0632 hours. Rotated film. Endotracheal tube not well seen but may still be in place. The NG tube passes into the stomach although the distal tip position is not included on the film.  Right IJ central line tip overlies the region of the innominate vein confluence. Left IJ central line tip is also positioned over the innominate vein confluence. Bibasilar atelectasis. Heart size within normal limits. Telemetry leads overlie the chest. IMPRESSION: Stable exam with bibasilar atelectasis. Electronically Signed   By: Kennith Center M.D.   On: 05/05/2017 09:05   Dg Chest Port 1 View  Result Date: 05/04/2017 CLINICAL DATA:  Central line placement. EXAM: PORTABLE CHEST 1 VIEW COMPARISON:  Chest radiograph May 03, 2016 FINDINGS: Endotracheal tube tip projects 3.6 cm above the carina. Large bore catheter RIGHT internal jugular vein with distal tip projecting in proximal superior vena cava. LEFT internal jugular central venous catheter distal tip projects in proximal superior vena cava. Nasogastric tube past proximal stomach, distal tip out of field-of-view. Cardiac silhouette is mildly enlarged, mediastinal silhouette is non suspicious. Bibasilar strandy densities. No pleural effusion or focal consolidation. No pneumothorax. Soft tissue planes and included osseous structures are unchanged. IMPRESSION: New LEFT internal jugular central venous catheter distal tip projects in proximal superior vena cava. No pneumothorax. No apparent change remaining life support lines. Mild cardiomegaly.  Bibasilar atelectasis/scarring. Electronically Signed   By: Awilda Metro M.D.   On: 05/04/2017 06:17   Dg Chest Port 1 View  Result Date: 05/03/2017 CLINICAL DATA:  Status post central line placement today. EXAM: PORTABLE CHEST 1 VIEW COMPARISON:  Single-view of the chest 05/01/2017. FINDINGS: The patient has a new right IJ approach central venous catheter with tip in the mid superior vena cava. ETT and NG tube are unchanged. Bibasilar atelectasis appears slightly worse on the left than on the prior study. No pleural effusion. Heart size is normal. IMPRESSION: Right IJ catheter tip projects in the mid  superior vena cava. Negative for pneumothorax. Bibasilar atelectasis. Electronically Signed   By: Drusilla Kanner M.D.   On: 05/03/2017 15:39   Dg Chest Port 1 View  Result Date: 05/01/2017 CLINICAL DATA:  Endotracheal tube placement.  OG tube placement. EXAM: PORTABLE CHEST 1 VIEW COMPARISON:  Radiograph earlier this day at 1131 hour FINDINGS: Endotracheal tube tip 3.7 cm from the carina. Enteric tube in place, tip below the diaphragm in the stomach, side-port just at the gastroesophageal junction. Low lung volumes persist. Peripheral rounded density in the right upper lung zone subjacent to the lateral third rib. Developing patchy opacity in the right lung base. Calcified granulomas in the left lung. Unchanged heart size and mediastinal contours. No large pleural effusion. No pneumothorax. No acute osseous abnormalities are seen. IMPRESSION: 1. Endotracheal tube tip 3.7 cm from the carina. 2. Tip of the enteric tube below the diaphragm in the stomach, side-port just at the esophageal junction, consider advancement of 2-3 cm for optimal placement. 3. Persistent low lung volumes. Peripheral rounded density in the right upper lung zone may be developing pneumonia, however recommend radiographic follow-up to resolution to exclude underlying mass. Increasing patchy opacity at the right lung  base, atelectasis or aspiration. Electronically Signed   By: Rubye OaksMelanie  Ehinger M.D.   On: 05/01/2017 23:08   Dg Chest Port 1 View  Result Date: 05/01/2017 CLINICAL DATA:  Altered mental status, hypertension, tachypnea. EXAM: PORTABLE CHEST 1 VIEW COMPARISON:  PA and lateral chest x-ray of July 08, 2015 FINDINGS: The lungs are mildly hypoinflated but clear. The heart and pulmonary vascularity are normal. The mediastinum is normal in width. There is no pleural effusion. The bony thorax is unremarkable. IMPRESSION: Mild hypoinflation.  No acute cardiopulmonary abnormality. Electronically Signed   By: David  SwazilandJordan M.D.   On:  05/01/2017 11:48    Labs:  CBC: Recent Labs    05/07/17 0334 05/08/17 0420 05/09/17 0608 05/10/17 0430  WBC 23.4* 22.7* 19.2* 16.5*  HGB 9.4* 8.6* 7.9* 7.7*  HCT 29.3* 27.3* 25.0* 24.0*  PLT 95* 188 148* 169    COAGS: Recent Labs    05/10/17 0650  INR 1.21    BMP: Recent Labs    05/08/17 0535 05/08/17 1600 05/09/17 0608 05/10/17 0814  NA 139 139 139 141  K 4.6 4.6 4.6 5.1  CL 103 104 106 110  CO2 23 23 19* 19*  GLUCOSE 212* 151* 221* 189*  BUN 69* 78* 91* 113*  CALCIUM 7.4* 7.6* 7.5* 7.6*  CREATININE 4.22* 4.84* 5.42* 6.03*  GFRNONAA 13* 11* 10* 9*  GFRAA 16* 13* 11* 10*    LIVER FUNCTION TESTS: Recent Labs    05/03/17 0328  05/06/17 0516  05/07/17 0334 05/08/17 0535 05/08/17 1600 05/09/17 0608 05/10/17 0814  BILITOT 0.7  --  0.7  --  0.6  --   --   --  0.4  AST 896*  --  239*  --  177*  --   --   --  68*  ALT 716*  --  378*  --  326*  --   --   --  120*  ALKPHOS 72  --  109  --  115  --   --   --  103  PROT 5.5*  --  6.1*  --  6.9  --   --   --  6.1*  ALBUMIN 1.7*   < > 1.7*  1.7*   < > 1.7*  1.6* 1.5* 1.4* 1.3* 1.4*   < > = values in this interval not displayed.    TUMOR MARKERS: No results for input(s): AFPTM, CEA, CA199, CHROMGRNA in the last 8760 hours.  Assessment and Plan:  B DVT Echo showing Rt heart strain-- presumed PE On hep drip Homeless; plan for long term immobilization Now scheduled for Inferior vena cava filter placement Risks and benefits discussed with the patient's wife via phone including, but not limited to bleeding, infection, contrast induced renal failure, filter fracture or migration which can lead to emergency surgery or even death, strut penetration with damage or irritation to adjacent structures and caval thrombosis.  All of her questions were answered,she is agreeable to proceed. Consent signed and in chart.   Thank you for this interesting consult.  I greatly enjoyed meeting Joseph Downs and look  forward to participating in their care.  A copy of this report was sent to the requesting provider on this date.  Electronically Signed: Robet LeuURPIN,Evann Koelzer A, PA-C 05/10/2017, 11:03 AM   I spent a total of 40 Minutes    in face to face in clinical consultation, greater than 50% of which was counseling/coordinating care for IVC filter placement

## 2017-05-10 NOTE — Progress Notes (Signed)
Independently examined pt, evaluated data & formulated above care plan with NP/resident   67 year old homeless man found down/13 with hypothermia, shock lactic acidosis. Found to have bilateral DVTs and evidence of RV strain on echo He had Acinetobacter in blood and staph in urine with elevated PSA  He remains febrile, critically ill, intubated with intermittent agitation on high-dose Precedex.  He has dry gangrene of both feet, decreased breath sounds bilateral, S1-S2 normal.  Labs show BUN/ creatinine continues to increase, WBC count has decreased but pro-calcitonin remains slight high.  Chest x-ray reviewed by me does not show any new infiltrates, ET tube at carina  Impression/plan  Acute respiratory failure-seems to wean well on pressure support/CPAP, mental status main limiting factor to extubation  Bilateral DVT/presumed massive PE with RV strain-hypotension resolved, continue IV heparin, dobutamine off, d/w IR to place IVC filter since expect prolonged immobility  Acute encephalopathy-continue Precedex and fentanyl drips. ct Seroquel and Klonopin  Acinetobacter sepsis-not clear that this is true bug versus contaminant, can simplify meropenem to Unasyn eventually once clinical improvement, note persistent fevers with slight high pro-calcitonin, doubt need to add vanco  The patient is critically ill with multiple organ systems failure and requires high complexity decision making for assessment and support, frequent evaluation and titration of therapies, application of advanced monitoring technologies and extensive interpretation of multiple databases. Critical Care Time devoted to patient care services described in this note independent of APP/resident  time is 35 minutes.   Comer Locketakesh V. Vassie LollAlva MD

## 2017-05-10 NOTE — Progress Notes (Signed)
ETT withdrawal per order 2 cm. ETT was at 26 at the lip and is now at 24 at the lip. No complications noted.

## 2017-05-11 ENCOUNTER — Inpatient Hospital Stay (HOSPITAL_COMMUNITY): Payer: Medicare Other

## 2017-05-11 LAB — URINE CULTURE: CULTURE: NO GROWTH

## 2017-05-11 LAB — CBC
HEMATOCRIT: 23.6 % — AB (ref 39.0–52.0)
Hemoglobin: 7.5 g/dL — ABNORMAL LOW (ref 13.0–17.0)
MCH: 28.6 pg (ref 26.0–34.0)
MCHC: 31.8 g/dL (ref 30.0–36.0)
MCV: 90.1 fL (ref 78.0–100.0)
Platelets: 215 10*3/uL (ref 150–400)
RBC: 2.62 MIL/uL — AB (ref 4.22–5.81)
RDW: 14.7 % (ref 11.5–15.5)
WBC: 16.9 10*3/uL — AB (ref 4.0–10.5)

## 2017-05-11 LAB — BASIC METABOLIC PANEL
ANION GAP: 13 (ref 5–15)
ANION GAP: 22 — AB (ref 5–15)
BUN: 115 mg/dL — ABNORMAL HIGH (ref 6–20)
BUN: 121 mg/dL — ABNORMAL HIGH (ref 6–20)
CALCIUM: 7.4 mg/dL — AB (ref 8.9–10.3)
CALCIUM: 6.9 mg/dL — AB (ref 8.9–10.3)
CO2: 19 mmol/L — AB (ref 22–32)
CO2: 17 mmol/L — ABNORMAL LOW (ref 22–32)
CREATININE: 5.58 mg/dL — AB (ref 0.61–1.24)
Chloride: 108 mmol/L (ref 101–111)
Chloride: 104 mmol/L (ref 101–111)
Creatinine, Ser: 5.58 mg/dL — ABNORMAL HIGH (ref 0.61–1.24)
GFR, EST AFRICAN AMERICAN: 11 mL/min — AB (ref >60)
GFR, EST AFRICAN AMERICAN: 11 mL/min — AB (ref >60)
GFR, EST NON AFRICAN AMERICAN: 10 mL/min — AB (ref >60)
GFR, EST NON AFRICAN AMERICAN: 10 mL/min — AB (ref >60)
GLUCOSE: 161 mg/dL — AB (ref 65–99)
Glucose, Bld: 192 mg/dL — ABNORMAL HIGH (ref 65–99)
Potassium: 5 mmol/L (ref 3.5–5.1)
Potassium: 4.7 mmol/L (ref 3.5–5.1)
SODIUM: 143 mmol/L (ref 135–145)
Sodium: 140 mmol/L (ref 135–145)

## 2017-05-11 LAB — GLUCOSE, CAPILLARY
GLUCOSE-CAPILLARY: 157 mg/dL — AB (ref 65–99)
GLUCOSE-CAPILLARY: 141 mg/dL — AB (ref 65–99)
GLUCOSE-CAPILLARY: 171 mg/dL — AB (ref 65–99)
Glucose-Capillary: 136 mg/dL — ABNORMAL HIGH (ref 65–99)
Glucose-Capillary: 167 mg/dL — ABNORMAL HIGH (ref 65–99)

## 2017-05-11 LAB — COOXEMETRY PANEL
Carboxyhemoglobin: 1.2 % (ref 0.5–1.5)
Carboxyhemoglobin: 1.3 % (ref 0.5–1.5)
Methemoglobin: 0.6 % (ref 0.0–1.5)
Methemoglobin: 0.7 % (ref 0.0–1.5)
O2 Saturation: 42.7 %
O2 Saturation: 65.3 %
Total hemoglobin: 8 g/dL — ABNORMAL LOW (ref 12.0–16.0)
Total hemoglobin: 8.9 g/dL — ABNORMAL LOW (ref 12.0–16.0)

## 2017-05-11 LAB — PROCALCITONIN: Procalcitonin: 4.23 ng/mL

## 2017-05-11 LAB — HEPARIN LEVEL (UNFRACTIONATED): HEPARIN UNFRACTIONATED: 0.36 [IU]/mL (ref 0.30–0.70)

## 2017-05-11 MED ORDER — SODIUM CHLORIDE 0.9% FLUSH
10.0000 mL | Freq: Two times a day (BID) | INTRAVENOUS | Status: DC
  Administered 2017-05-11: 10 mL
  Administered 2017-05-12: 40 mL
  Administered 2017-05-12 – 2017-05-18 (×11): 10 mL
  Administered 2017-05-18: 20 mL
  Administered 2017-05-19 – 2017-05-31 (×19): 10 mL

## 2017-05-11 MED ORDER — CLONAZEPAM 0.5 MG PO TBDP
1.0000 mg | ORAL_TABLET | Freq: Two times a day (BID) | ORAL | Status: DC
  Administered 2017-05-11 – 2017-05-12 (×3): 1 mg via ORAL
  Filled 2017-05-11: qty 4
  Filled 2017-05-11: qty 2
  Filled 2017-05-11 (×2): qty 4

## 2017-05-11 MED ORDER — CHLORHEXIDINE GLUCONATE CLOTH 2 % EX PADS
6.0000 | MEDICATED_PAD | Freq: Every day | CUTANEOUS | Status: DC
  Administered 2017-05-12 – 2017-05-20 (×6): 6 via TOPICAL

## 2017-05-11 MED ORDER — SODIUM CHLORIDE 0.9% FLUSH: 10.0000 mL | INTRAVENOUS | Status: DC | PRN

## 2017-05-11 NOTE — Progress Notes (Signed)
PULMONARY / CRITICAL CARE MEDICINE   Name: Joseph Downs MRN: [id number] DOB: July 03, 1950    ADMISSION DATE:  05/01/2017 CONSULTATION DATE:  05/01/17  REFERRING MD:  Dr. Deretha Emory  CHIEF COMPLAINT:  Found down  HISTORY OF PRESENT ILLNESS:   67 year old homeless man found down/13 with hypothermia, shock lactic acidosis. Found to have bilateral DVTs and evidence of RV strain on echo He had Acinetobacter in blood and staph in urine with elevated PSA  STUDIES:  ECHO 2/14 - EF 65-70%, G1DD, RV strain Echo 2/21 - EF 55-60%, RV improved  CULTURES: MRSA PCR negative Ucx + staph lugdenensis Blood cx 2/13 >> Diphtheroids + Acinetobacter lwoffii Blood cx 2/19 >>NGTD Blood cx 2/20 >> NGTD  ANTIBIOTICS: Zosyn 2/13> 2/18 Vanc 2/13 >>2/19 Meropenem 2/18 >>  SIGNIFICANT EVENTS: 2/13 admitted, intubated 2/22 IVC filter placed  LINES/TUBES: ETT 2/13 >>  Foley 2/13 OGT 2/13 Right IJ HD catheter 2/15 >> Left IJ CVC 2/15 >>  SUBJECTIVE:  remains critically ill, intubated with intermittent agitation on high-dose Precedex Fever curve decreasing Good UO Back on levophed overnight, being tapered Weaning well on PS/CPAP    VITAL SIGNS: BP (!) 100/50   Pulse 87   Temp (!) 100.7 F (38.2 C) (Oral)   Resp 15   Ht 5\' 10"  (1.778 m)   Wt 195 lb 15.8 oz (88.9 kg)   SpO2 94%   BMI 28.12 kg/m   HEMODYNAMICS:    VENTILATOR SETTINGS: Vent Mode: PRVC FiO2 (%):  [30 %] 30 % Set Rate:  [18 bmp] 18 bmp Vt Set:  [580 mL] 580 mL PEEP:  [5 cmH20] 5 cmH20 Pressure Support:  [5 cmH20-10 cmH20] 5 cmH20 Plateau Pressure:  [17 cmH20-19 cmH20] 17 cmH20  INTAKE / OUTPUT: I/O last 3 completed shifts: In: 5433 [I.V.:2546.8; NF/AO:1308.6; IV Piggyback:200] Out: 4375 [Urine:4475; Stool:100]  PHYSICAL EXAMINATION: General: Ill-appearing man, lying in bed, sedated, intubated Neuro: follows commands on precedex/fent gtt HEENT: oral ETT , no oral lesions Cardiovascular: Regular, no  murmur Lungs: Coarse bilateral breath sounds Abdomen: Soft, benign, positive bowel sounds Musculoskeletal: Bilateral lower extremity pitting edema, feet wrapped bilaterally  Skin: skin sloughing feet bilaterally , dry gangrene toes  LABS:  BMET Recent Labs  Lab 05/09/17 0608 05/10/17 0814 05/11/17 0345  NA 139 141 140  K 4.6 5.1 5.0  CL 106 110 108  CO2 19* 19* 19*  BUN 91* 113* 115*  CREATININE 5.42* 6.03* 5.58*  GLUCOSE 221* 189* 161*    Electrolytes Recent Labs  Lab 05/07/17 0334 05/08/17 0535 05/08/17 1600 05/09/17 0608 05/10/17 0814 05/11/17 0345  CALCIUM 7.7* 7.4* 7.6* 7.5* 7.6* 7.4*  MG 2.4 2.4  --  2.4  --   --   PHOS 3.8 5.2* 6.6* 7.0*  --   --     CBC Recent Labs  Lab 05/09/17 0608 05/10/17 0430 05/11/17 0345  WBC 19.2* 16.5* 16.9*  HGB 7.9* 7.7* 7.5*  HCT 25.0* 24.0* 23.6*  PLT 148* 169 215    Coag's Recent Labs  Lab 05/10/17 0650  INR 1.21    Sepsis Markers Recent Labs  Lab 05/10/17 1112 05/11/17 0345  PROCALCITON 4.78 4.23    ABG No results for input(s): PHART, PCO2ART, PO2ART in the last 168 hours.  Liver Enzymes Recent Labs  Lab 05/06/17 0516  05/07/17 0334  05/08/17 1600 05/09/17 0608 05/10/17 0814  AST 239*  --  177*  --   --   --  68*  ALT 378*  --  326*  --   --   --  120*  ALKPHOS 109  --  115  --   --   --  103  BILITOT 0.7  --  0.6  --   --   --  0.4  ALBUMIN 1.7*  1.7*   < > 1.7*  1.6*   < > 1.4* 1.3* 1.4*   < > = values in this interval not displayed.    Cardiac Enzymes No results for input(s): TROPONINI, PROBNP in the last 168 hours.  Glucose Recent Labs  Lab 05/10/17 1153 05/10/17 1554 05/10/17 2016 05/11/17 0000 05/11/17 0350 05/11/17 0805  GLUCAP 205* 115* 95 145* 157* 141*    Imaging Ir Ivc Filter Plmt / S&i /img Guid/mod Sed  Result Date: 05/10/2017 INDICATION: 67 year old homeless male found down in the elements. Currently admitted with numerous medical problems including bilateral  lower extremity DVT, presumed intermediate or high risk pulmonary embolism, acute renal failure requiring CRRT, bilateral lower extremity frost bite. Due to his significant underlying medical comorbidities and tenuous cardial respiratory status, dual prophylaxis with IVC filter +intravenous heparin is warranted. Additionally, the patient may require multiple medical procedures during this admission requiring temporary cessation of anticoagulation. The filter will provide PE prophylaxis during those times. Finally, there is a so celiac innominate concern that the patient will not be compliant with antibiotic therapy if he recovers. If he is able to recover and regain ambulation and be discharged on anticoagulation, the filter may be retrievable in the future. CO2 venography will be performed to minimize further damage to the kidneys. EXAM: ULTRASOUND GUIDANCE FOR VASCULARACCESS IVC CATHETERIZATION AND VENOGRAM IVC FILTER INSERTION Interventional Radiologist:  Sterling BigHeath K. McCullough, MD MEDICATIONS: None. ANESTHESIA/SEDATION: None FLUOROSCOPY TIME:  Fluoroscopy Time: 0 minutes 36 seconds (64 mGy). COMPLICATIONS: None immediate. PROCEDURE: Informed written consent was obtained from the patient after a thorough discussion of the procedural risks, benefits and alternatives. All questions were addressed. Maximal Sterile Barrier Technique was utilized including caps, mask, sterile gowns, sterile gloves, sterile drape, hand hygiene and skin antiseptic. A timeout was performed prior to the initiation of the procedure. Maximal barrier sterile technique utilized including caps, mask, sterile gowns, sterile gloves, large sterile drape, hand hygiene, and Betadine prep. Under sterile condition and local anesthesia, right internal jugular venous access was performed with ultrasound. An ultrasound image was saved and sent to PACS. Over a guidewire, the IVC filter delivery sheath and inner dilator were advanced into the IVC just above  the IVC bifurcation. Carbon dioxide gas injection was performed for an IVC venogram. Through the delivery sheath, a retrievable Denali IVC filter was deployed below the level of the renal veins and above the IVC bifurcation. Limited post deployment venacavagram was performed. The delivery sheath was removed and hemostasis was obtained with manual compression. A dressing was placed. The patient tolerated the procedure well without immediate post procedural complication. FINDINGS: The IVC is patent. No evidence of thrombus, stenosis, or occlusion. No variant venous anatomy. Successful placement of the IVC filter below the level of the renal veins. IMPRESSION: Successful ultrasound and fluoroscopically guided placement of an infrarenal retrievable IVC filter via right jugular approach. PLAN: This IVC filter is potentially retrievable. The patient will be assessed for filter retrieval by Interventional Radiology in approximately 8-12 weeks. Further recommendations regarding filter retrieval, continued surveillance or declaration of device permanence, will be made at that time. Signed, Sterling BigHeath K. McCullough, MD [facility] Electronically Signed   By:  Malachy Moan M.D.   On: 05/10/2017 15:20   Dg Chest Port 1 View  Result Date: 05/11/2017 CLINICAL DATA:  Respiratory failure. EXAM: PORTABLE CHEST 1 VIEW COMPARISON:  Chest x-ray from yesterday. FINDINGS: The endotracheal tube has been retracted, and is now approximately 4.6 cm above the level of the carina. Retracted enteric tube, with the tip in the distal esophagus. Unchanged bilateral internal jugular central venous catheters. The heart size and mediastinal contours are within normal limits. Normal pulmonary vascularity. Unchanged low lung volumes with streaky opacities at both lung bases. No large pleural effusion. No consolidation or pneumothorax. IMPRESSION: 1. Retracted enteric tube, with the tip in  the distal esophagus. Recommend advancement. 2. Stable low lung volumes with bibasilar atelectasis. Electronically Signed   By: Obie Dredge M.D.   On: 05/11/2017 08:24       DISCUSSION: Bilateral lower extremity DVT, right heart strain by echocardiogram very suggestive of massive PE.  He had polymicrobial bacteremia, no evidence of valvular disease by echo, not yet speciated.  Also with Staph lugdunensis UTI (often associated with prostate cancer). PSA elevated. Progressive oliguric renal failure, now CVVHD. Acinetobacter in blood could be contaminant or related to cellulitis  Current issues are vent dependence & worsening AKI  ASSESSMENT / PLAN:  PULMONARY A: Acute respiratory failure on ventilator Likely pulmonary embolism with associated right heart strain   P:   Ct SBTs on PS/CPAP with goal extuabtion Continue heparin infusion  CARDIOVASCULAR A:  Stress related non-ST elevation MI, suspect right heart strain Acute Cor pulmonale  P:  Appreciate cardiology input He will require an ischemic evaluation once stable  RENAL A:   Acute non-oliguric renal failure-UO remains good Hyperkalemia- resolved Lactic acidosis- resolved Rhabdomyolysis-improving P:   Nephrology following, appreciate recommendations Renal function worsened off CRRT - did not tolerate iHD due to hypotension, may need CRRT again Follow BMP, urine output Continue IV lasix  GASTROINTESTINAL A:   NPO Elevated LFTs, suspect shock liver P:   Continue PPI Continue tube feeding LFTs intermittently   HEMATOLOGIC / ONC A:   Bilateral DVTs S/p IVC filter Leukocytosis Anemia Elevated PSA, Staph lugdunensis both suggestive of possible prostatic CA Thrombocytopenia- 106 > 95 > 188 P:  Continue heparin drip Follow CBC Transfusion threshold < 7 He will need further evaluation for possible prostate cancer once he stabilizes from his critical illness  INFECTIOUS A:   RLL infiltrate on CXR UTI-   >100,000 colonies staph lugdenenis s/p 7 days of vanc Acinetobacter lwoffii bacteremia. P:   continue meropenem for acinetobacter Trach aspirate and repeat blood cx NGTD Wound care for his bilateral foot wounds Trend WBC, fevers  ENDOCRINE A:   hyperglycemia P:   Follow CBG Sliding scale insulin per protocol  WOUND CARE A: Bilateral feet with evidence for skin sloughing P: Continue current dressing changes Vascular surgery following, defer arteriogram due to poor renal function  NEUROLOGIC A:   Toxic metabolic encephalopathy Sedation for ventilator tolerance P:   RASS goal: -1 Ct  Fentanyl &  Precedex Increase klonopin 1 bid titrate prn Continue seroquel 100 mg BID for agitation  Versed prn   FAMILY  - Updates: Mother in [place], sister in [place] - Inter-disciplinary family meet or Palliative Care meeting due by:  done   The patient is critically ill with multiple organ systems failure and requires high complexity decision making for assessment and support, frequent evaluation and titration of therapies, application of advanced monitoring technologies and extensive interpretation of multiple databases.  Critical Care Time devoted to patient care services described in this note independent of APP/resident  time is 35 minutes.   Cyril Mourning MD. Tonny Bollman. [facility] Pager [phone] If no response call [phone]    05/11/2017 9:35 AM

## 2017-05-11 NOTE — Progress Notes (Signed)
ANTICOAGULATION CONSULT NOTE - Follow- Up Consult  Pharmacy Consult for Heparin  Indication: chest pain/ACS / new BL DVTs/possible PE  No Known Allergies  Patient Measurements: Height: 5\' 10"  (177.8 cm) Weight: 195 lb 15.8 oz (88.9 kg) IBW/kg (Calculated) : 73  Vital Signs: Temp: 100.7 F (38.2 C) (02/23 0803) Temp Source: Oral (02/23 0803) BP: 113/63 (02/23 1100) Pulse Rate: 87 (02/23 1100)  Labs: Recent Labs    05/09/17 [id number]  05/09/17 [id number] 05/10/17 0430 05/10/17 0650 05/10/17 0814 05/11/17 0345  HGB  --    < > 7.9* 7.7*  --   --  7.5*  HCT  --   --  25.0* 24.0*  --   --  23.6*  PLT  --   --  148* 169  --   --  215  LABPROT  --   --   --   --  15.2  --   --   INR  --   --   --   --  1.21  --   --   HEPARINUNFRC 0.43  --   --  0.38  --   --  0.36  CREATININE  --   --  5.42*  --   --  6.03* 5.58*   < > = values in this interval not displayed.    Estimated Creatinine Clearance: 14.6 mL/min (A) (by C-G formula based on SCr of 5.58 mg/dL (H)).  Assessment: [name] in the ICU after being "found down".Patient has been found to have bilateral lower extremity with concern for possible PE.  Heparin level today remains therapeutic at 0.36 on 1400 units/hr.. H/H down slightly. Platelets 215. No signs of bleeding have been noted.   Goal of Therapy:  Heparin level 0.3-0.7 units/ml Monitor platelets by anticoagulation protocol: Yes   Plan:  Continue heparin at 1400 units/hr  Daily heparin level and CBC Follow for long term plans for anticoagulation  Vinnie LevelBenjamin Jaliya Siegmann, PharmD., BCPS Clinical Pharmacist Pager [phone]

## 2017-05-11 NOTE — Procedures (Signed)
1345 Pt placed back on full support due to increased WOB & inc RR >35.  Pt tolerating full support well, RT will monitor

## 2017-05-11 NOTE — Progress Notes (Signed)
RN notified of pt temp of 100.8 F orally.  

## 2017-05-11 NOTE — Progress Notes (Signed)
Admit: 05/01/2017 LOS: 10  80M with AKI now off CRRT after likely massive PE, shock, bacteremia  Subjective:  Did not tolerate iHD yesterday, immediate hypotension req NE, tx stopped NE weening down S/p IVC placement Made 3.5L of urine BUN 115, SCr 5.58, K 5.0 this AM Lasix 80 IV BID  02/22 0701 - 02/23 0700 In: 4275.8 [I.V.:2464.6; NG/GT:1611.3; IV Piggyback:200] Out: 3325 [HFWYO:3785]  Filed Weights   05/09/17 0418 05/10/17 0500 05/11/17 0415  Weight: 86.3 kg (190 lb 4.1 oz) 87.9 kg (193 lb 12.6 oz) 88.9 kg (195 lb 15.8 oz)    Scheduled Meds: . aspirin  81 mg Per Tube Daily  . chlorhexidine gluconate (MEDLINE KIT)  15 mL Mouth Rinse BID  . [START ON 05/12/2017] Chlorhexidine Gluconate Cloth  6 each Topical Daily  . clonazepam  0.5 mg Oral BID  . free water  100 mL Per Tube Q6H  . furosemide  80 mg Intravenous BID  . insulin aspart  2-6 Units Subcutaneous Q4H  . ipratropium-albuterol  3 mL Nebulization Q6H  . mouth rinse  15 mL Mouth Rinse 10 times per day  . pantoprazole (PROTONIX) IV  40 mg Intravenous QHS  . QUEtiapine  100 mg Oral BID  . silver sulfADIAZINE   Topical Daily  . sodium chloride flush  10-40 mL Intracatheter Q12H  . thiamine injection  100 mg Intravenous Daily   Continuous Infusions: . sodium chloride 10 mL/hr at 05/11/17 0400  . dexmedetomidine (PRECEDEX) IV infusion 1.5 mcg/kg/hr (05/11/17 0511)  . feeding supplement (VITAL AF 1.2 CAL) 1,000 mL (05/11/17 0400)  . fentaNYL infusion INTRAVENOUS 100 mcg/hr (05/11/17 0400)  . heparin 1,400 Units/hr (05/11/17 0400)  . meropenem (MERREM) IV Stopped (05/10/17 1826)  . norepinephrine (LEVOPHED) Adult infusion 5 mcg/min (05/11/17 0403)   PRN Meds:.Place/Maintain arterial line **AND** sodium chloride, acetaminophen (TYLENOL) oral liquid 160 mg/5 mL, docusate, fentaNYL, lidocaine (PF), midazolam, sodium chloride flush  Current Labs: reviewed    Physical Exam:  Blood pressure 111/63, pulse 81, temperature (!)  100.6 F (38.1 C), temperature source Oral, resp. rate 17, height _0  (1.778 m), weight 88.9 kg (195 lb 15.8 oz), SpO2 100 %. Chronically ill appearing Sedated / intubated Tachy, regular Coarse bs b/l Ulcers / sores of b/l feet Temp HD cath R IJ  A 1. 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 B/l DVT and presumed PE on heparin s/p IVC filter 2/22 4. RV Failure now off dobutamin 5. Hypotension / shock on NE 6. Anemia 7. VDRF 8. Homelessness 9. Sepsis with UTI and Acinetobacter bacteremia meropenem 10. Presence of temporary dialysis catheter 11. B/l sores/ulcers of feet/ frostbite  P Watch for 24h given good UOP Azotemia partially from high protein TFs Check PM labs Would need CRRT if HD needed again in next Meyer MD 05/11/2017, 6:33 AM  Recent Labs  Lab 05/08/17 0535 05/08/17 1600 05/09/17 0608 05/10/17 0814 05/11/17 0345  NA 139 139 139 141 140  K 4.6 4.6 4.6 5.1 5.0  CL 103 104 106 110 108  CO2 23 23 19* 19* 19*  GLUCOSE 212* 151* 221* 189* 161*  BUN 69* 78* 91* 113* 115*  CREATININE 4.22* 4.84* 5.42* 6.03* 5.58*  CALCIUM 7.4* 7.6* 7.5* 7.6* 7.4*  PHOS 5.2* 6.6* 7.0*  --   --    Recent Labs  Lab 05/09/17 [phone] 05/10/17 0430  05/11/17 0345  WBC 19.2* 16.5* 16.9*  HGB 7.9* 7.7* 7.5*  HCT 25.0* 24.0* 23.6*  MCV 89.6 90.6 90.1  PLT 148* 169 215

## 2017-05-11 NOTE — Progress Notes (Signed)
RN notified of pt temp 100.7 F orally.

## 2017-05-11 NOTE — Progress Notes (Addendum)
RN notified of pt temp of 101.8 F orally. 

## 2017-05-11 NOTE — Procedures (Signed)
Pt placed back on full support at this time due to increased WOB & increased RR >35.  Will attempt wean again later this am as tolerated.  RT will monitor

## 2017-05-12 ENCOUNTER — Inpatient Hospital Stay (HOSPITAL_COMMUNITY): Payer: Medicare Other

## 2017-05-12 LAB — CULTURE, BLOOD (ROUTINE X 2)
CULTURE: NO GROWTH
Culture: NO GROWTH
SPECIAL REQUESTS: ADEQUATE
SPECIAL REQUESTS: ADEQUATE

## 2017-05-12 LAB — GLUCOSE, CAPILLARY
GLUCOSE-CAPILLARY: 132 mg/dL — AB (ref 65–99)
GLUCOSE-CAPILLARY: 141 mg/dL — AB (ref 65–99)
GLUCOSE-CAPILLARY: 193 mg/dL — AB (ref 65–99)
Glucose-Capillary: 108 mg/dL — ABNORMAL HIGH (ref 65–99)
Glucose-Capillary: 129 mg/dL — ABNORMAL HIGH (ref 65–99)
Glucose-Capillary: 150 mg/dL — ABNORMAL HIGH (ref 65–99)
Glucose-Capillary: 146 mg/dL — ABNORMAL HIGH (ref 65–99)

## 2017-05-12 LAB — BASIC METABOLIC PANEL
Anion gap: 14 (ref 5–15)
BUN: 124 mg/dL — AB (ref 6–20)
CHLORIDE: 112 mmol/L — AB (ref 101–111)
CO2: 19 mmol/L — AB (ref 22–32)
Calcium: 7.7 mg/dL — ABNORMAL LOW (ref 8.9–10.3)
Creatinine, Ser: 5.5 mg/dL — ABNORMAL HIGH (ref 0.61–1.24)
GFR calc Af Amer: 11 mL/min — ABNORMAL LOW (ref >60)
GFR calc non Af Amer: 10 mL/min — ABNORMAL LOW (ref >60)
Glucose, Bld: 113 mg/dL — ABNORMAL HIGH (ref 65–99)
POTASSIUM: 5.3 mmol/L — AB (ref 3.5–5.1)
SODIUM: 145 mmol/L (ref 135–145)

## 2017-05-12 LAB — URINE CULTURE: Culture: NO GROWTH

## 2017-05-12 LAB — CBC
HEMATOCRIT: 23.3 % — AB (ref 39.0–52.0)
HEMOGLOBIN: 7.3 g/dL — AB (ref 13.0–17.0)
MCH: 28.3 pg (ref 26.0–34.0)
MCHC: 31.3 g/dL (ref 30.0–36.0)
MCV: 90.3 fL (ref 78.0–100.0)
Platelets: 279 10*3/uL (ref 150–400)
RBC: 2.58 MIL/uL — ABNORMAL LOW (ref 4.22–5.81)
RDW: 14.7 % (ref 11.5–15.5)
WBC: 14.9 10*3/uL — ABNORMAL HIGH (ref 4.0–10.5)

## 2017-05-12 LAB — HEPARIN LEVEL (UNFRACTIONATED): Heparin Unfractionated: 0.37 IU/mL (ref 0.30–0.70)

## 2017-05-12 LAB — PROCALCITONIN: PROCALCITONIN: 3.65 ng/mL

## 2017-05-12 MED ORDER — ORAL CARE MOUTH RINSE
15.0000 mL | Freq: Four times a day (QID) | OROMUCOSAL | Status: DC
  Administered 2017-05-12 – 2017-06-10 (×81): 15 mL via OROMUCOSAL

## 2017-05-12 MED ORDER — CLONAZEPAM 0.5 MG PO TBDP
1.0000 mg | ORAL_TABLET | Freq: Two times a day (BID) | ORAL | Status: DC
  Administered 2017-05-12 – 2017-05-17 (×11): 1 mg
  Filled 2017-05-12: qty 4
  Filled 2017-05-12 (×5): qty 2
  Filled 2017-05-12: qty 4
  Filled 2017-05-12 (×2): qty 2
  Filled 2017-05-12: qty 4

## 2017-05-12 NOTE — Progress Notes (Signed)
RN notified of pt temp of 100.4 F orally 

## 2017-05-12 NOTE — Progress Notes (Signed)
ANTICOAGULATION CONSULT NOTE - Follow- Up Consult  Pharmacy Consult for Heparin  Indication: chest pain/ACS / new BL DVTs/possible PE  No Known Allergies  Patient Measurements: Height: 5\' 10"  (177.8 cm) Weight: 191 lb 12.8 oz (87 kg) IBW/kg (Calculated) : 73  Vital Signs: Temp: 100.8 F (38.2 C) (02/24 0755) Temp Source: Oral (02/24 0755) BP: 134/53 (02/24 0812) Pulse Rate: 101 (02/24 0812)  Labs: Recent Labs    05/10/17 0430 05/10/17 0650  05/11/17 0345 05/11/17 1631 05/12/17 0301  HGB 7.7*  --   --  7.5*  --  7.3*  HCT 24.0*  --   --  23.6*  --  23.3*  PLT 169  --   --  215  --  279  LABPROT  --  15.2  --   --   --   --   INR  --  1.21  --   --   --   --   HEPARINUNFRC 0.38  --   --  0.36  --  0.37  CREATININE  --   --    < > 5.58* 5.58* 5.50*   < > = values in this interval not displayed.    Estimated Creatinine Clearance: 13.6 mL/min (A) (by C-G formula based on SCr of 5.5 mg/dL (H)).  Assessment: [name] in the ICU after being "found down".Patient has been found to have bilateral lower extremity with concern for possible PE.  Heparin level today remains therapeutic at 0.37 on 1400 units/hr.. H/H down slightly. Platelets 279. No signs of bleeding have been noted.   Goal of Therapy:  Heparin level 0.3-0.7 units/ml Monitor platelets by anticoagulation protocol: Yes   Plan:  Continue heparin at 1400 units/hr  Daily heparin level and CBC Follow for long term plans for anticoagulation  Vinnie LevelBenjamin Peja Allender, PharmD., BCPS Clinical Pharmacist Pager [phone]

## 2017-05-12 NOTE — Progress Notes (Signed)
PULMONARY / CRITICAL CARE MEDICINE   Name: Joseph FloridaLarry E [name] MRN: [id number] DOB: [birth year]    ADMISSION DATE:  05/01/2017 CONSULTATION DATE:  05/01/17  REFERRING MD:  Dr. Deretha EmoryZackowski  CHIEF COMPLAINT:  Found down  HISTORY OF PRESENT ILLNESS:   67 year old homeless man found down/13 with hypothermia, shock lactic acidosis. Found to have bilateral DVTs and evidence of RV strain on echo He had Acinetobacter in blood and staph in urine with elevated PSA  STUDIES:  ECHO 2/14 - EF 65-70%, G1DD, RV strain Echo 2/21 - EF 55-60%, RV improved  CULTURES: MRSA PCR negative Ucx + staph lugdenensis Blood cx 2/13 >> Diphtheroids + Acinetobacter lwoffii Blood cx 2/19 >>NGTD Blood cx 2/20 >> NGTD  ANTIBIOTICS: Zosyn 2/13> 2/18 Vanc 2/13 >>2/19 Meropenem 2/18 >>  SIGNIFICANT EVENTS: 2/13 admitted, intubated 2/22 IVC filter placed  LINES/TUBES: ETT 2/13 >>  Foley 2/13 OGT 2/13 Right IJ HD catheter 2/15 >> Left IJ CVC 2/15 >>  SUBJECTIVE:  Remains critically ill, intubated with intermittent agitation onPrecedex LOw gr febrile Excellent UO Off levophed      VITAL SIGNS: BP (!) 134/53   Pulse (!) 101   Temp (!) 100.8 F (38.2 C) (Oral)   Resp (!) 34   Ht 5\' 10"  (1.778 m)   Wt 191 lb 12.8 oz (87 kg)   SpO2 100%   BMI 27.52 kg/m   HEMODYNAMICS:    VENTILATOR SETTINGS: Vent Mode: PRVC FiO2 (%):  [30 %] 30 % Set Rate:  [18 bmp] 18 bmp Vt Set:  [580 mL] 580 mL PEEP:  [5 cmH20] 5 cmH20 Pressure Support:  [5 cmH20] 5 cmH20 Plateau Pressure:  [12 cmH20-26 cmH20] 26 cmH20  INTAKE / OUTPUT: I/O last 3 completed shifts: In: 5888.6 [I.V.:2348.6; NG/GT:3440; IV Piggyback:100] Out: 5780 [Urine:5780]  PHYSICAL EXAMINATION: General: Ill-appearing man, lying in bed, sedated, intubated Neuro: follows commands on precedex/fent gtt HEENT: oral ETT , no oral lesions Cardiovascular: Regular, no murmur Lungs: Coarse bilateral breath sounds Abdomen: Soft, benign, positive  bowel sounds Musculoskeletal: Bilateral lower extremity pitting edema, feet wrapped bilaterally  Skin: skin sloughing feet bilaterally , dry gangrene toes  LABS:  BMET Recent Labs  Lab 05/11/17 0345 05/11/17 1631 05/12/17 0301  NA 140 143 145  K 5.0 4.7 5.3*  CL 108 104 112*  CO2 19* 17* 19*  BUN 115* 121* 124*  CREATININE 5.58* 5.58* 5.50*  GLUCOSE 161* 192* 113*    Electrolytes Recent Labs  Lab 05/07/17 0334 05/08/17 0535 05/08/17 1600 05/09/17 0608  05/11/17 0345 05/11/17 1631 05/12/17 0301  CALCIUM 7.7* 7.4* 7.6* 7.5*   < > 7.4* 6.9* 7.7*  MG 2.4 2.4  --  2.4  --   --   --   --   PHOS 3.8 5.2* 6.6* 7.0*  --   --   --   --    < > = values in this interval not displayed.    CBC Recent Labs  Lab 05/10/17 0430 05/11/17 0345 05/12/17 0301  WBC 16.5* 16.9* 14.9*  HGB 7.7* 7.5* 7.3*  HCT 24.0* 23.6* 23.3*  PLT 169 215 279    Coag's Recent Labs  Lab 05/10/17 0650  INR 1.21    Sepsis Markers Recent Labs  Lab 05/10/17 1112 05/11/17 0345 05/12/17 0301  PROCALCITON 4.78 4.23 3.65    ABG No results for input(s): PHART, PCO2ART, PO2ART in the last 168 hours.  Liver Enzymes Recent Labs  Lab 05/06/17 0516  05/07/17 0334  05/08/17 1600 05/09/17  1610 05/10/17 0814  AST 239*  --  177*  --   --   --  68*  ALT 378*  --  326*  --   --   --  120*  ALKPHOS 109  --  115  --   --   --  103  BILITOT 0.7  --  0.6  --   --   --  0.4  ALBUMIN 1.7*  1.7*   < > 1.7*  1.6*   < > 1.4* 1.3* 1.4*   < > = values in this interval not displayed.    Cardiac Enzymes No results for input(s): TROPONINI, PROBNP in the last 168 hours.  Glucose Recent Labs  Lab 05/11/17 1152 05/11/17 1624 05/11/17 1939 05/12/17 0026 05/12/17 0314 05/12/17 0820  GLUCAP 136* 167* 171* 132* 108* 193*    Imaging Dg Chest Port 1 View  Result Date: 05/12/2017 CLINICAL DATA:  Followup ventilator support. EXAM: PORTABLE CHEST 1 VIEW COMPARISON:  05/12/2011 FINDINGS: Extensive  artifact overlies the chest. Endotracheal tube tip is 4 cm above the carina. Nasogastric tube enters the stomach. Left internal jugular central line tip in the SVC at the azygos level. Persistent bibasilar atelectasis/infiltrate. No worsening or new finding. IMPRESSION: Lines and tubes well positioned. Persistent bibasilar atelectasis or infiltrate. Electronically Signed   By: Paulina Fusi M.D.   On: 05/12/2017 07:28   Dg Abd Portable 1v  Result Date: 05/11/2017 CLINICAL DATA:  Evaluate OG tube placement EXAM: PORTABLE ABDOMEN - 1 VIEW COMPARISON:  None. FINDINGS: The OG tube terminates in the stomach. IMPRESSION: The OG tube terminates in the stomach. Electronically Signed   By: Gerome Sam III M.D   On: 05/11/2017 11:03       DISCUSSION: Bilateral lower extremity DVT, right heart strain by echocardiogram very suggestive of massive PE.  He had polymicrobial bacteremia, no evidence of valvular disease by echo, not yet speciated.  Also with Staph lugdunensis UTI (often associated with prostate cancer). PSA elevated. Acinetobacter in blood could be contaminant or related to cellulitis AKI -off  CVVHD.   Current issues are vent dependence & worsening AKI  ASSESSMENT / PLAN:  PULMONARY A: Acute respiratory failure on ventilator Likely pulmonary embolism with associated right heart strain  P:   Ct SBTs on PS/CPAP with goal extuabtion Continue heparin infusion  CARDIOVASCULAR A:  Stress related non-ST elevation MI, suspect right heart strain Acute Cor pulmonale  P:  Appreciate cardiology input He will require an ischemic evaluation once stable  RENAL A:   Acute non-oliguric renal failure-UO remains good Hyperkalemia- resolved Lactic acidosis- resolved Rhabdomyolysis-improving P:   Nephrology following, good UO with lasix , hence holding off HD Did not tolerate iHD due to hypotension, may need CRRT again Follow BMP, urine output Continue IV lasix  GASTROINTESTINAL A:    NPO Elevated LFTs, suspect shock liver P:   Continue PPI Continue tube feeding LFTs intermittently   HEMATOLOGIC / ONC A:   Bilateral DVTs S/p IVC filter Leukocytosis Anemia Elevated PSA, Staph lugdunensis both suggestive of possible prostatic CA Thrombocytopenia- resolved P:  Continue heparin drip Follow CBC Transfusion threshold < 7 He will need further evaluation for possible prostate cancer once he stabilizes from his critical illness  INFECTIOUS A:   RLL infiltrate on CXR UTI-  >100,000 colonies staph lugdenenis s/p 7 days of vanc Acinetobacter lwoffii bacteremia. Persistent fever  P:   continue meropenem x 2 weeks Cultures neg, pct decreasing reassuring Wound care for his bilateral  foot wounds Trend WBC, fevers  ENDOCRINE A:   hyperglycemia P:   Follow CBG Sliding scale insulin per protocol  WOUND CARE A: Bilateral feet with evidence for skin sloughing P: Continue current dressing changes Vascular surgery following, defer arteriogram for now due to poor renal function  NEUROLOGIC A:   Toxic metabolic encephalopathy Sedation for ventilator tolerance P:   RASS goal: -1 Ct  Fentanyl &  Precedex ct klonopin 1 bid  Continue seroquel 100 mg BID for agitation  Versed prn   FAMILY  - Updates: Mother in [place], sister in [place] - Inter-disciplinary family meet or Palliative Care meeting due by: done   The patient is critically ill with multiple organ systems failure and requires high complexity decision making for assessment and support, frequent evaluation and titration of therapies, application of advanced monitoring technologies and extensive interpretation of multiple databases. Critical Care Time devoted to patient care services described in this note independent of APP/resident  time is 33 minutes.   Cyril Mourning MD. Tonny Bollman. [facility] Pager [phone] If no response call [phone]    05/12/2017 9:02 AM

## 2017-05-12 NOTE — Progress Notes (Signed)
Admit: 05/01/2017 LOS: 11  33M with AKI now off CRRT after likely massive PE, shock, bacteremia  Subjective:  Stable SCr, K 5.3 this AM Good UOP Off pressors Still not weaning well on vent Remains on lasix 80 IV BID  02/23 0701 - 02/24 0700 In: 3848.2 [I.V.:1438.2; NG/GT:2310; IV Piggyback:100] Out: 4005 [Urine:4005]  Filed Weights   05/10/17 0500 05/11/17 0415 05/12/17 0230  Weight: 87.9 kg (193 lb 12.6 oz) 88.9 kg (195 lb 15.8 oz) 87 kg (191 lb 12.8 oz)    Scheduled Meds: . aspirin  81 mg Per Tube Daily  . chlorhexidine gluconate (MEDLINE KIT)  15 mL Mouth Rinse BID  . Chlorhexidine Gluconate Cloth  6 each Topical Daily  . clonazepam  1 mg Oral BID  . free water  100 mL Per Tube Q6H  . furosemide  80 mg Intravenous BID  . insulin aspart  2-6 Units Subcutaneous Q4H  . ipratropium-albuterol  3 mL Nebulization Q6H  . mouth rinse  15 mL Mouth Rinse QID  . pantoprazole (PROTONIX) IV  40 mg Intravenous QHS  . QUEtiapine  100 mg Oral BID  . silver sulfADIAZINE   Topical Daily  . sodium chloride flush  10-40 mL Intracatheter Q12H  . thiamine injection  100 mg Intravenous Daily   Continuous Infusions: . sodium chloride 10 mL/hr at 05/12/17 0400  . dexmedetomidine (PRECEDEX) IV infusion 1 mcg/kg/hr (05/12/17 0700)  . feeding supplement (VITAL AF 1.2 CAL) 1,000 mL (05/12/17 0700)  . fentaNYL infusion INTRAVENOUS 75 mcg/hr (05/12/17 0830)  . heparin 1,400 Units/hr (05/11/17 2232)  . meropenem (MERREM) IV Stopped (05/11/17 1857)  . norepinephrine (LEVOPHED) Adult infusion Stopped (05/11/17 1632)   PRN Meds:.Place/Maintain arterial line **AND** sodium chloride, acetaminophen (TYLENOL) oral liquid 160 mg/5 mL, docusate, fentaNYL, lidocaine (PF), midazolam, sodium chloride flush  Current Labs: reviewed    Physical Exam:  Blood pressure (!) 134/53, pulse (!) 101, temperature (!) 100.8 F (38.2 C), temperature source Oral, resp. rate (!) 34, height 5' 10"  (1.778 m), weight 87 kg  (191 lb 12.8 oz), SpO2 100 %. Chronically ill appearing Sedated / intubated Tachy, regular Coarse bs b/l Ulcers / sores of b/l feet Temp HD cath R IJ  A 1. Dialysis dependent nonoliguric AKI after PE / RV Failure, mild rhabdo, likely ATN; on lasix BID; prev on CRRT, did not tol iHD attempt 2/22 2. 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 B/l DVT and presumed PE on heparin s/p IVC filter 2/22 4. RV Failure now off dobutamine 5. Hypotension / shock off pressors 6. Anemia 7. VDRF 8. Homelessness 9. Sepsis with UTI and Acinetobacter bacteremia meropenem 10. Presence of temporary dialysis catheter 11. B/l sores/ulcers of feet/ frostbite  P No HD needed today Watch for another 24h given good UOP Has temp HD cath; will need to tunnel or remove over course of this week  Pearson Grippe MD 05/12/2017, 9:56 AM  Recent Labs  Lab 05/08/17 0535 05/08/17 1600 05/09/17 0608  05/11/17 0345 05/11/17 1631 05/12/17 0301  NA 139 139 139   < > 140 143 145  K 4.6 4.6 4.6   < > 5.0 4.7 5.3*  CL 103 104 106   < > 108 104 112*  CO2 23 23 19*   < > 19* 17* 19*  GLUCOSE 212* 151* 221*   < > 161* 192* 113*  BUN 69* 78* 91*   < > 115* 121* 124*  CREATININE 4.22* 4.84* 5.42*   < >  5.58* 5.58* 5.50*  CALCIUM 7.4* 7.6* 7.5*   < > 7.4* 6.9* 7.7*  PHOS 5.2* 6.6* 7.0*  --   --   --   --    < > = values in this interval not displayed.   Recent Labs  Lab 05/10/17 0430 05/11/17 0345 05/12/17 0301  WBC 16.5* 16.9* 14.9*  HGB 7.7* 7.5* 7.3*  HCT 24.0* 23.6* 23.3*  MCV 90.6 90.1 90.3  PLT 169 215 279

## 2017-05-12 NOTE — Progress Notes (Signed)
RN notified of pt temp of 100.8 F orally.  

## 2017-05-13 ENCOUNTER — Inpatient Hospital Stay (HOSPITAL_COMMUNITY): Payer: Medicare Other

## 2017-05-13 LAB — RENAL FUNCTION PANEL
ALBUMIN: 1.3 g/dL — AB (ref 3.5–5.0)
Anion gap: 11 (ref 5–15)
BUN: 128 mg/dL — AB (ref 6–20)
CO2: 19 mmol/L — ABNORMAL LOW (ref 22–32)
Calcium: 8 mg/dL — ABNORMAL LOW (ref 8.9–10.3)
Chloride: 117 mmol/L — ABNORMAL HIGH (ref 101–111)
Creatinine, Ser: 5.1 mg/dL — ABNORMAL HIGH (ref 0.61–1.24)
GFR calc non Af Amer: 11 mL/min — ABNORMAL LOW (ref >60)
GFR, EST AFRICAN AMERICAN: 12 mL/min — AB (ref >60)
Glucose, Bld: 156 mg/dL — ABNORMAL HIGH (ref 65–99)
PHOSPHORUS: 7.4 mg/dL — AB (ref 2.5–4.6)
POTASSIUM: 5.8 mmol/L — AB (ref 3.5–5.1)
Sodium: 147 mmol/L — ABNORMAL HIGH (ref 135–145)

## 2017-05-13 LAB — BASIC METABOLIC PANEL
Anion gap: 12 (ref 5–15)
BUN: 130 mg/dL — AB (ref 6–20)
CALCIUM: 7.8 mg/dL — AB (ref 8.9–10.3)
CHLORIDE: 115 mmol/L — AB (ref 101–111)
CO2: 19 mmol/L — ABNORMAL LOW (ref 22–32)
CREATININE: 5.34 mg/dL — AB (ref 0.61–1.24)
GFR calc non Af Amer: 10 mL/min — ABNORMAL LOW (ref >60)
GFR, EST AFRICAN AMERICAN: 12 mL/min — AB (ref >60)
Glucose, Bld: 158 mg/dL — ABNORMAL HIGH (ref 65–99)
Potassium: 5.5 mmol/L — ABNORMAL HIGH (ref 3.5–5.1)
SODIUM: 146 mmol/L — AB (ref 135–145)

## 2017-05-13 LAB — IRON AND TIBC
Iron: 19 ug/dL — ABNORMAL LOW (ref 45–182)
SATURATION RATIOS: 14 % — AB (ref 17.9–39.5)
TIBC: 133 ug/dL — ABNORMAL LOW (ref 250–450)
UIBC: 114 ug/dL

## 2017-05-13 LAB — GLUCOSE, CAPILLARY
GLUCOSE-CAPILLARY: 132 mg/dL — AB (ref 65–99)
GLUCOSE-CAPILLARY: 134 mg/dL — AB (ref 65–99)
GLUCOSE-CAPILLARY: 158 mg/dL — AB (ref 65–99)
Glucose-Capillary: 123 mg/dL — ABNORMAL HIGH (ref 65–99)
Glucose-Capillary: 138 mg/dL — ABNORMAL HIGH (ref 65–99)
Glucose-Capillary: 144 mg/dL — ABNORMAL HIGH (ref 65–99)
Glucose-Capillary: 145 mg/dL — ABNORMAL HIGH (ref 65–99)

## 2017-05-13 LAB — CULTURE, BLOOD (ROUTINE X 2)
CULTURE: NO GROWTH
CULTURE: NO GROWTH
SPECIAL REQUESTS: ADEQUATE
Special Requests: ADEQUATE

## 2017-05-13 LAB — CBC
HCT: 22.4 % — ABNORMAL LOW (ref 39.0–52.0)
Hemoglobin: 7 g/dL — ABNORMAL LOW (ref 13.0–17.0)
MCH: 28 pg (ref 26.0–34.0)
MCHC: 30.8 g/dL (ref 30.0–36.0)
MCV: 91.1 fL (ref 78.0–100.0)
PLATELETS: 364 10*3/uL (ref 150–400)
RBC: 2.46 MIL/uL — AB (ref 4.22–5.81)
RDW: 14.9 % (ref 11.5–15.5)
WBC: 15.2 10*3/uL — ABNORMAL HIGH (ref 4.0–10.5)

## 2017-05-13 LAB — HEPARIN LEVEL (UNFRACTIONATED): Heparin Unfractionated: 0.28 IU/mL — ABNORMAL LOW (ref 0.30–0.70)

## 2017-05-13 LAB — PHOSPHORUS: Phosphorus: 7.1 mg/dL — ABNORMAL HIGH (ref 2.5–4.6)

## 2017-05-13 LAB — MAGNESIUM: MAGNESIUM: 2.6 mg/dL — AB (ref 1.7–2.4)

## 2017-05-13 MED ORDER — SODIUM BICARBONATE 650 MG PO TABS
1300.0000 mg | ORAL_TABLET | Freq: Two times a day (BID) | ORAL | Status: DC
  Administered 2017-05-13 (×2): 1300 mg via ORAL
  Filled 2017-05-13 (×3): qty 2

## 2017-05-13 MED ORDER — SODIUM POLYSTYRENE SULFONATE 15 GM/60ML PO SUSP
15.0000 g | Freq: Once | ORAL | Status: DC
  Filled 2017-05-13: qty 60

## 2017-05-13 MED ORDER — SODIUM POLYSTYRENE SULFONATE PO POWD
15.0000 g | Freq: Once | ORAL | Status: AC
  Administered 2017-05-13: 15 g via ORAL
  Filled 2017-05-13: qty 15

## 2017-05-13 NOTE — Progress Notes (Signed)
ANTICOAGULATION CONSULT NOTE  Pharmacy Consult for Heparin  Indication: chest pain/ACS / new BL DVTs/possible PE  No Known Allergies  Patient Measurements: Height: 5\' 10"  (177.8 cm) Weight: 191 lb 12.8 oz (87 kg) IBW/kg (Calculated) : 73  Vital Signs: Temp: 99.7 F (37.6 C) (02/25 0404) Temp Source: Oral (02/25 0404) BP: 121/50 (02/25 0404) Pulse Rate: 85 (02/25 0445)  Labs: Recent Labs    05/10/17 0650  05/11/17 0345 05/11/17 1631 05/12/17 0301 05/13/17 0430  HGB  --    < > 7.5*  --  7.3* PENDING  HCT  --   --  23.6*  --  23.3* 22.4*  PLT  --   --  215  --  279 364  LABPROT 15.2  --   --   --   --   --   INR 1.21  --   --   --   --   --   HEPARINUNFRC  --   --  0.36  --  0.37 0.28*  CREATININE  --    < > 5.58* 5.58* 5.50*  --    < > = values in this interval not displayed.     Assessment: [name] in the ICU after being "found down".Patient has been found to have bilateral lower extremity with concern for possible PE.  Heparin level is subtherapeutic. hgb down slightly. No issues with infusion.  Goal of Therapy:  Heparin level 0.3-0.7 units/ml Monitor platelets by anticoagulation protocol: Yes   Plan:  Increase heparin 1600 units/hr Daily heparin level and CBC Level in 8 hours  Baldemar FridayMasters, Takiya Belmares M 05/13/2017 5:15 AM

## 2017-05-13 NOTE — Progress Notes (Signed)
Advanced Heart Failure Rounding Note  PCP-Cardiologist: Skeet Latch, MD   Subjective:    Remains intubated. Off CVVHD. Off inotropes. Not responsive. Remains febrile despite meropenem  Objective:   Weight Range: 88.1 kg (194 lb 3.6 oz) Body mass index is 27.87 kg/m.   Vital Signs:   Temp:  [99.3 F (37.4 C)-102.5 F (39.2 C)] 101.8 F (38.8 C) (02/25 1116) Pulse Rate:  [78-107] 94 (02/25 0900) Resp:  [20-33] 23 (02/25 0900) BP: (94-134)/(43-93) 122/73 (02/25 0900) SpO2:  [97 %-100 %] 100 % (02/25 0915) Arterial Line BP: (94-141)/(44-70) 132/58 (02/25 0900) FiO2 (%):  [30 %] 30 % (02/25 0915) Weight:  [88.1 kg (194 lb 3.6 oz)] 88.1 kg (194 lb 3.6 oz) (02/25 0530) Last BM Date: 05/13/17  Weight change: Filed Weights   05/11/17 0415 05/12/17 0230 05/13/17 0530  Weight: 88.9 kg (195 lb 15.8 oz) 87 kg (191 lb 12.8 oz) 88.1 kg (194 lb 3.6 oz)    Intake/Output:   Intake/Output Summary (Last 24 hours) at 05/13/2017 1125 Last data filed at 05/13/2017 1000 Gross per 24 hour  Intake 3198.57 ml  Output 3510 ml  Net -311.43 ml      Physical Exam   General:  Lying in bed sedated. On vent. Will respond to stimuli but not purposeful  HEENT: edentuolous. ETT Neck: supple. no JVD. Carotids 2+ bilat; no bruits. No lymphadenopathy or thryomegaly appreciated. Cor: PMI nondisplaced. Regular rate & rhythm. No rubs, gallops or murmurs. Lungs: clear Abdomen: soft, nontender, nondistended. No hepatosplenomegaly. No bruits or masses. Good bowel sounds. Extremities: no cyanosis, clubbing, rash, trace edema . Feet wrapped. Necrotic toes  Neuro: intubated/sedated    Telemetry   Sinus 90s, personally reviewed.   EKG    No new tracings.    Labs    CBC Recent Labs    05/12/17 0301 05/13/17 0430  WBC 14.9* 15.2*  HGB 7.3* 7.0*  HCT 23.3* 22.4*  MCV 90.3 91.1  PLT 279 824   Basic Metabolic Panel Recent Labs    05/12/17 0301 05/13/17 0430  NA 145 146*  K 5.3*  5.5*  CL 112* 115*  CO2 19* 19*  GLUCOSE 113* 158*  BUN 124* 130*  CREATININE 5.50* 5.34*  CALCIUM 7.7* 7.8*  MG  --  2.6*  PHOS  --  7.1*   Liver Function Tests No results for input(s): AST, ALT, ALKPHOS, BILITOT, PROT, ALBUMIN in the last 72 hours. No results for input(s): LIPASE, AMYLASE in the last 72 hours. Cardiac Enzymes No results for input(s): CKTOTAL, CKMB, CKMBINDEX, TROPONINI in the last 72 hours.  BNP: BNP (last 3 results) Recent Labs    05/01/17 1954  BNP 208.4*    ProBNP (last 3 results) No results for input(s): PROBNP in the last 8760 hours.   D-Dimer No results for input(s): DDIMER in the last 72 hours. Hemoglobin A1C No results for input(s): HGBA1C in the last 72 hours. Fasting Lipid Panel No results for input(s): CHOL, HDL, LDLCALC, TRIG, CHOLHDL, LDLDIRECT in the last 72 hours. Thyroid Function Tests No results for input(s): TSH, T4TOTAL, T3FREE, THYROIDAB in the last 72 hours.  Invalid input(s): FREET3  Other results:   Imaging    Dg Chest Port 1 View  Result Date: 05/13/2017 CLINICAL DATA:  Acute respiratory failure EXAM: PORTABLE CHEST 1 VIEW COMPARISON:  Yesterday FINDINGS: Endotracheal tube tip between the clavicular heads and carina. An orogastric tube reaches the stomach. Bilateral IJ catheters with tips at the upper SVC. Platelike  opacity at the right more than left base, stable. No Kerley lines, effusion, or pneumothorax. Interface overlapping the posterior right third rib is stable and osseous based on priors. IMPRESSION: 1. Stable positioning of hardware. 2. Stable atelectasis at the bases. Electronically Signed   By: Monte Fantasia M.D.   On: 05/13/2017 07:08     Medications:     Scheduled Medications: . aspirin  81 mg Per Tube Daily  . chlorhexidine gluconate (MEDLINE KIT)  15 mL Mouth Rinse BID  . Chlorhexidine Gluconate Cloth  6 each Topical Daily  . clonazepam  1 mg Per Tube BID  . free water  100 mL Per Tube Q6H  .  insulin aspart  2-6 Units Subcutaneous Q4H  . ipratropium-albuterol  3 mL Nebulization Q6H  . mouth rinse  15 mL Mouth Rinse QID  . pantoprazole (PROTONIX) IV  40 mg Intravenous QHS  . QUEtiapine  100 mg Oral BID  . silver sulfADIAZINE   Topical Daily  . sodium bicarbonate  1,300 mg Oral BID  . sodium chloride flush  10-40 mL Intracatheter Q12H  . thiamine injection  100 mg Intravenous Daily    Infusions: . sodium chloride 10 mL/hr at 05/13/17 0600  . dexmedetomidine (PRECEDEX) IV infusion 2 mcg/kg/hr (05/13/17 1114)  . feeding supplement (VITAL AF 1.2 CAL) Stopped (05/13/17 0915)  . fentaNYL infusion INTRAVENOUS 100 mcg/hr (05/13/17 1034)  . heparin 1,600 Units/hr (05/13/17 0600)  . meropenem (MERREM) IV Stopped (05/12/17 1812)  . norepinephrine (LEVOPHED) Adult infusion Stopped (05/13/17 0857)    PRN Medications: Place/Maintain arterial line **AND** sodium chloride, acetaminophen (TYLENOL) oral liquid 160 mg/5 mL, docusate, fentaNYL, lidocaine (PF), midazolam, sodium chloride flush    Patient Profile   69M with homelessness and no known past medical history here after being found down and hypothermic. He was initially hypotensive and tachycardic and had a leukocytosis. CXR concerning for CAP, urine culture positive for Staph lugdeunensis.Troponin was elevated to 2.8. He was improving and then became responsive and subsequently developed hypoxic respiratory failure requiring intubation. He was found to have extensive lower extremity DVTs and presumptively PE (no CT due to AKI). Echo positive for RV failure and McConnell's sign. Hospitalization complicated by AKI and oliguria.  Assessment/Plan  1. Acute RV failure/cor pulmonale - echo LVEF 65-70% with severe RV HK - Likely massive PE (has extensive DVT on LE u/s). No CT with AKI. - Now off dobutamine. RV strain improved on last echo - Continue hepain  2. Shock - likely mixed cardiogenic (RV) and septic - Now off  inotorpes - 3. Elevated troponin - Due to PE and RV strain. ECG ok no evidence of regional LV wall motionaabnormality to suggest ACS. No change. - No need for further cardiac testing at this point  4. Acute respiratory failure - Due to #1. Also PNA on CXR - Remains on vent and abx per CCM.   5. ARF - Due to shock and rhabdo on CVVHD 2/17 -> 2/19. - Mxing urine. Volume status looks good. - Nephrology managing.    6. Sepsis - Staph lugdunensis in urine (with elevated PSA (42) suggestive of possible prostate CA) - ACINETOBACTER LWOFFII in blood  - ABX per CCM.   7. Frostbite with LE skin loss - WOC following. Appreciate care.   8. Homelessness   Little to add at this point from cardiac standpoint. Will sign off. Please call if needed.    Length of Stay: 12  Glori Bickers, MD  05/13/2017, 11:25 AM  Advanced Heart Failure Team Pager [phone] (M-F; [place])  Please contact [facility] for night-coverage after hours (4p -7a ) and weekends on [email or web address]

## 2017-05-13 NOTE — Procedures (Signed)
Pt placed back on full support at this time due to increased agitation, increased RR >40. RT will monitor

## 2017-05-13 NOTE — Progress Notes (Signed)
Subjective: Interval History: on vent, sedated, but moves  Objective: Vital signs in last 24 hours: Temp:  [99.3 F (37.4 C)-102.5 F (39.2 C)] 99.7 F (37.6 C) (02/25 0404) Pulse Rate:  [78-107] 80 (02/25 0700) Resp:  [20-34] 21 (02/25 0700) BP: (94-134)/(43-93) 112/54 (02/25 0700) SpO2:  [97 %-100 %] 97 % (02/25 0700) Arterial Line BP: (94-141)/(44-70) 128/52 (02/25 0700) FiO2 (%):  [30 %] 30 % (02/25 0600) Weight:  [88.1 kg (194 lb 3.6 oz)] 88.1 kg (194 lb 3.6 oz) (02/25 0530) Weight change: 1.1 kg (2 lb 6.8 oz)  Intake/Output from previous day: 02/24 0701 - 02/25 0700 In: 3467.1 [I.V.:1312.1; NG/GT:2055; IV Piggyback:100] Out: 3700 [Urine:3700] Intake/Output this shift: No intake/output data recorded.  General appearance: as above, no coop Neck: RIJ HD cath Resp: rales bilaterally and on vent Cardio: S1, S2 normal and systolic murmur: systolic ejection 2/6, decrescendo at 2nd left intercostal space GI: pos bs, soft, Extremities: edema 3+  Lab Results: Recent Labs    05/12/17 0301 05/13/17 0430  WBC 14.9* 15.2*  HGB 7.3* 7.0*  HCT 23.3* 22.4*  PLT 279 364   BMET:  Recent Labs    05/12/17 0301 05/13/17 0430  NA 145 146*  K 5.3* 5.5*  CL 112* 115*  CO2 19* 19*  GLUCOSE 113* 158*  BUN 124* 130*  CREATININE 5.50* 5.34*  CALCIUM 7.7* 7.8*   No results for input(s): PTH in the last 72 hours. Iron Studies: No results for input(s): IRON, TIBC, TRANSFERRIN, FERRITIN in the last 72 hours.  Studies/Results: Dg Chest Port 1 View  Result Date: 05/13/2017 CLINICAL DATA:  Acute respiratory failure EXAM: PORTABLE CHEST 1 VIEW COMPARISON:  Yesterday FINDINGS: Endotracheal tube tip between the clavicular heads and carina. An orogastric tube reaches the stomach. Bilateral IJ catheters with tips at the upper SVC. Platelike opacity at the right more than left base, stable. No Kerley lines, effusion, or pneumothorax. Interface overlapping the posterior right third rib is  stable and osseous based on priors. IMPRESSION: 1. Stable positioning of hardware. 2. Stable atelectasis at the bases. Electronically Signed   By: Marnee SpringJonathon  Watts M.D.   On: 05/13/2017 07:08   Dg Chest Port 1 View  Result Date: 05/12/2017 CLINICAL DATA:  Followup ventilator support. EXAM: PORTABLE CHEST 1 VIEW COMPARISON:  05/12/2011 FINDINGS: Extensive artifact overlies the chest. Endotracheal tube tip is 4 cm above the carina. Nasogastric tube enters the stomach. Left internal jugular central line tip in the SVC at the azygos level. Persistent bibasilar atelectasis/infiltrate. No worsening or new finding. IMPRESSION: Lines and tubes well positioned. Persistent bibasilar atelectasis or infiltrate. Electronically Signed   By: Paulina FusiMark  Shogry M.D.   On: 05/12/2017 07:28   Dg Abd Portable 1v  Result Date: 05/11/2017 CLINICAL DATA:  Evaluate OG tube placement EXAM: PORTABLE ABDOMEN - 1 VIEW COMPARISON:  None. FINDINGS: The OG tube terminates in the stomach. IMPRESSION: The OG tube terminates in the stomach. Electronically Signed   By: Gerome Samavid  Williams III M.D   On: 05/11/2017 11:03    I have reviewed the patient's current medications.  Assessment/Plan: 1 AKI ATN,  ? Some component rhabdo but mostly ischemic. Acidemic, borderline k, vol xs. Will use Na bicarb.  Diuresing well. XS fluid admin 2 VDRF per CCM 3 Sepsis on AB 4 PE/DVT on anticoag 5 Social situation 6 Anemia check Fe P stop Lasix, use Na bicarb, vent, AB,     LOS: 12 days   Sanyiah Kanzler 05/13/2017,7:18 AM

## 2017-05-13 NOTE — Progress Notes (Addendum)
PULMONARY / CRITICAL CARE MEDICINE   Name: Joseph Downs MRN: [id number] DOB: [birth year]    ADMISSION DATE:  05/01/2017 CONSULTATION DATE:  05/01/17  REFERRING MD:  Dr. Deretha Emory  CHIEF COMPLAINT:  Found down  HISTORY OF PRESENT ILLNESS:   67 year old homeless man found down/13 with hypothermia, shock lactic acidosis. Found to have bilateral DVTs and evidence of RV strain on echo He had Acinetobacter in blood and staph in urine with elevated PSA  STUDIES:  ECHO 2/14 - EF 65-70%, G1DD, RV strain Echo 2/21 - EF 55-60%, RV improved  CULTURES: MRSA PCR negative Ucx + staph lugdenensis Blood cx 2/13 >> Diphtheroids + Acinetobacter lwoffii Blood cx 2/19 >>NGTD Blood cx 2/20 >> NGTD  ANTIBIOTICS: Zosyn 2/13> 2/18 Vanc 2/13 >>2/19 Meropenem 2/18 >>  SIGNIFICANT EVENTS: 2/13 admitted, intubated 2/22 IVC filter placed  LINES/TUBES: ETT 2/13 >>  Foley 2/13 OGT 2/13 Right IJ HD catheter 2/15 >> Left IJ CVC 2/15 >>  SUBJECTIVE:  No acute changes overnight. Remains on levophed.  VITAL SIGNS: BP 116/70   Pulse 79   Temp 99.7 F (37.6 C) (Oral)   Resp (!) 21   Ht 5\' 10"  (1.778 m)   Wt 194 lb 3.6 oz (88.1 kg)   SpO2 100%   BMI 27.87 kg/m   HEMODYNAMICS:    VENTILATOR SETTINGS: Vent Mode: PRVC FiO2 (%):  [30 %] 30 % Set Rate:  [18 bmp] 18 bmp Vt Set:  [580 mL] 580 mL PEEP:  [5 cmH20] 5 cmH20 Plateau Pressure:  [15 cmH20-20 cmH20] 15 cmH20  INTAKE / OUTPUT: I/O last 3 completed shifts: In: 5347.4 [I.V.:2042.4; NG/GT:3205; IV Piggyback:100] Out: 5875 [Urine:5875]  PHYSICAL EXAMINATION: General: Ill-appearing man, lying in bed, sedated, intubated Neuro: follows commands on precedex/fent gtt HEENT: oral ETT , no oral lesions Cardiovascular: Regular, no murmur Lungs: Coarse bilateral breath sounds Abdomen: Soft, benign, positive bowel sounds Musculoskeletal: Bilateral lower extremity pitting edema, feet wrapped bilaterally  Skin: skin sloughing feet  bilaterally , dry gangrene toes  LABS:  BMET Recent Labs  Lab 05/11/17 1631 05/12/17 0301 05/13/17 0430  NA 143 145 146*  K 4.7 5.3* 5.5*  CL 104 112* 115*  CO2 17* 19* 19*  BUN 121* 124* 130*  CREATININE 5.58* 5.50* 5.34*  GLUCOSE 192* 113* 158*    Electrolytes Recent Labs  Lab 05/08/17 0535 05/08/17 1600 05/09/17 0608  05/11/17 1631 05/12/17 0301 05/13/17 0430  CALCIUM 7.4* 7.6* 7.5*   < > 6.9* 7.7* 7.8*  MG 2.4  --  2.4  --   --   --  2.6*  PHOS 5.2* 6.6* 7.0*  --   --   --  7.1*   < > = values in this interval not displayed.    CBC Recent Labs  Lab 05/11/17 0345 05/12/17 0301 05/13/17 0430  WBC 16.9* 14.9* 15.2*  HGB 7.5* 7.3* 7.0*  HCT 23.6* 23.3* 22.4*  PLT 215 279 364    Coag's Recent Labs  Lab 05/10/17 0650  INR 1.21    Sepsis Markers Recent Labs  Lab 05/10/17 1112 05/11/17 0345 05/12/17 0301  PROCALCITON 4.78 4.23 3.65    ABG No results for input(s): PHART, PCO2ART, PO2ART in the last 168 hours.  Liver Enzymes Recent Labs  Lab 05/07/17 0334  05/08/17 1600 05/09/17 0608 05/10/17 0814  AST 177*  --   --   --  68*  ALT 326*  --   --   --  120*  ALKPHOS 115  --   --   --  103  BILITOT 0.6  --   --   --  0.4  ALBUMIN 1.7*  1.6*   < > 1.4* 1.3* 1.4*   < > = values in this interval not displayed.    Cardiac Enzymes No results for input(s): TROPONINI, PROBNP in the last 168 hours.  Glucose Recent Labs  Lab 05/12/17 1212 05/12/17 1644 05/12/17 1935 05/13/17 0018 05/13/17 0402 05/13/17 0810  GLUCAP 129* 150* 146* 158* 134* 123*    Imaging Dg Chest Port 1 View  Result Date: 05/13/2017 CLINICAL DATA:  Acute respiratory failure EXAM: PORTABLE CHEST 1 VIEW COMPARISON:  Yesterday FINDINGS: Endotracheal tube tip between the clavicular heads and carina. An orogastric tube reaches the stomach. Bilateral IJ catheters with tips at the upper SVC. Platelike opacity at the right more than left base, stable. No Kerley lines,  effusion, or pneumothorax. Interface overlapping the posterior right third rib is stable and osseous based on priors. IMPRESSION: 1. Stable positioning of hardware. 2. Stable atelectasis at the bases. Electronically Signed   By: Marnee Spring M.D.   On: 05/13/2017 07:08   DISCUSSION: Bilateral lower extremity DVT, right heart strain by echocardiogram very suggestive of massive PE.  He had polymicrobial bacteremia, no evidence of valvular disease by echo, not yet speciated.  Also with Staph lugdunensis UTI (often associated with prostate cancer). PSA elevated. Acinetobacter in blood could be contaminant or related to cellulitis AKI -off  CVVHD.   Current issues are vent dependence & worsening AKI  ASSESSMENT / PLAN:  PULMONARY A: Acute respiratory failure on ventilator Likely pulmonary embolism with associated right heart strain P:   Ct SBTs on PS/CPAP with goal extuabtion Continue heparin infusion  CARDIOVASCULAR A:  Stress related non-ST elevation MI, suspect right heart strain Acute Cor pulmonale P:  Appreciate cardiology input He will require an ischemic evaluation once stable  RENAL A:   Acute non-oliguric renal failure-UO remains good Hyperkalemia- resolved Lactic acidosis- resolved Rhabdomyolysis-improving P:   Nephrology following, appreciate recommendations Did not tolerate iHD due to hypotension 2/22, may need CRRT again Follow BMP, urine output Continue IV lasix  GASTROINTESTINAL A:   NPO Elevated LFTs, suspect shock liver - resolved P:   Continue PPI Continue tube feeding Check LFTs intermittently  HEMATOLOGIC / ONC A:   Bilateral DVTs S/p IVC filter Leukocytosis Anemia Elevated PSA, Staph lugdunensis both suggestive of possible prostatic CA Thrombocytopenia- resolved P:  Continue heparin drip Follow CBC Transfusion threshold < 7 He will need further evaluation for possible prostate cancer once he stabilizes from his critical  illness  INFECTIOUS A:   RLL infiltrate on CXR UTI-  >100,000 colonies staph lugdenenis s/p 7 days of vanc Acinetobacter lwoffii bacteremia. Persistent fever  P:   continue meropenem x 2 weeks- on Day # 7 Cultures remain neg, pct decreasing reassuring Wound care for his bilateral foot wounds Trend WBC, fevers  ENDOCRINE A:   hyperglycemia P:   Follow CBG Sliding scale insulin per protocol  WOUND CARE A: Bilateral feet with evidence for skin sloughing P: Continue current dressing changes Vascular surgery following, defer arteriogram for now due to poor renal function  NEUROLOGIC A:   Toxic metabolic encephalopathy Sedation for ventilator tolerance P:   RASS goal: -1 Ct  Fentanyl &  Precedex ct klonopin 1 bid  Continue seroquel 100 mg BID for agitation  Versed prn   FAMILY  - Updates: Mother in [place], sister in [place], niece at bedside updated 2/25 - Inter-disciplinary family meet or Palliative  Care meeting due by: done  Dolores PattyAngela Riccio, DO PGY-2, [facility] 05/13/2017 8:17 AM  Attending Note:  67 year old homeless male with respiratory failure due to DVT and PE and obstructive shock.  Patient was intubated and admitted to the ICU.  Patient was noted to have a very large clot burden of DVT bilateral lower ext.  IVC filter is in place.  Heparin drip started.  On exam, very agitated with clear lungs.  I reviewed CXR myself, ETT is in good position.  Will attempt to stop all sedation, evaluate mental status and likely extubate without a successful wean as he will be very difficult to control agitation wise.  If fails then will reintubate.  No family bedside to update.  The patient is critically ill with multiple organ systems failure and requires high complexity decision making for assessment and support, frequent evaluation and titration of therapies, application of advanced monitoring technologies and extensive interpretation of multiple databases.    Critical Care Time devoted to patient care services described in this note is  35  Minutes. This time reflects time of care of this signee Dr Koren BoundWesam Yacoub. This critical care time does not reflect procedure time, or teaching time or supervisory time of PA/NP/Med student/Med Resident etc but could involve care discussion time.  Alyson ReedyWesam G. Yacoub, M.D. [facility]. Pager: [phone]. After hours pager: [phone].

## 2017-05-14 ENCOUNTER — Inpatient Hospital Stay (HOSPITAL_COMMUNITY): Payer: Medicare Other

## 2017-05-14 DIAGNOSIS — J9601 Acute respiratory failure with hypoxia: Secondary | ICD-10-CM

## 2017-05-14 LAB — GLUCOSE, CAPILLARY
Glucose-Capillary: 140 mg/dL — ABNORMAL HIGH (ref 65–99)
Glucose-Capillary: 157 mg/dL — ABNORMAL HIGH (ref 65–99)
Glucose-Capillary: 112 mg/dL — ABNORMAL HIGH (ref 65–99)
Glucose-Capillary: 121 mg/dL — ABNORMAL HIGH (ref 65–99)

## 2017-05-14 LAB — BASIC METABOLIC PANEL
Anion gap: 12 (ref 5–15)
BUN: 125 mg/dL — ABNORMAL HIGH (ref 6–20)
CO2: 19 mmol/L — AB (ref 22–32)
Calcium: 8.1 mg/dL — ABNORMAL LOW (ref 8.9–10.3)
Chloride: 120 mmol/L — ABNORMAL HIGH (ref 101–111)
Creatinine, Ser: 4.77 mg/dL — ABNORMAL HIGH (ref 0.61–1.24)
GFR calc Af Amer: 13 mL/min — ABNORMAL LOW (ref >60)
GFR, EST NON AFRICAN AMERICAN: 12 mL/min — AB (ref >60)
GLUCOSE: 156 mg/dL — AB (ref 65–99)
POTASSIUM: 5.6 mmol/L — AB (ref 3.5–5.1)
Sodium: 151 mmol/L — ABNORMAL HIGH (ref 135–145)

## 2017-05-14 LAB — RENAL FUNCTION PANEL
ANION GAP: 12 (ref 5–15)
Albumin: 1.3 g/dL — ABNORMAL LOW (ref 3.5–5.0)
BUN: 125 mg/dL — ABNORMAL HIGH (ref 6–20)
CHLORIDE: 120 mmol/L — AB (ref 101–111)
CO2: 19 mmol/L — ABNORMAL LOW (ref 22–32)
Calcium: 8.1 mg/dL — ABNORMAL LOW (ref 8.9–10.3)
Creatinine, Ser: 4.77 mg/dL — ABNORMAL HIGH (ref 0.61–1.24)
GFR, EST AFRICAN AMERICAN: 13 mL/min — AB (ref >60)
GFR, EST NON AFRICAN AMERICAN: 12 mL/min — AB (ref >60)
Glucose, Bld: 156 mg/dL — ABNORMAL HIGH (ref 65–99)
POTASSIUM: 5.6 mmol/L — AB (ref 3.5–5.1)
Phosphorus: 6.8 mg/dL — ABNORMAL HIGH (ref 2.5–4.6)
Sodium: 151 mmol/L — ABNORMAL HIGH (ref 135–145)

## 2017-05-14 LAB — BLOOD GAS, ARTERIAL
Acid-base deficit: 5.8 mmol/L — ABNORMAL HIGH (ref 0.0–2.0)
BICARBONATE: 19 mmol/L — AB (ref 20.0–28.0)
Drawn by: 44135
FIO2: 30
LHR: 18 {breaths}/min
O2 SAT: 98.4 %
PATIENT TEMPERATURE: 98.6
PEEP: 5 cmH2O
VT: 580 mL
pCO2 arterial: 36.6 mmHg (ref 32.0–48.0)
pH, Arterial: 7.335 — ABNORMAL LOW (ref 7.350–7.450)
pO2, Arterial: 141 mmHg — ABNORMAL HIGH (ref 83.0–108.0)

## 2017-05-14 LAB — CBC
HCT: 25.6 % — ABNORMAL LOW (ref 39.0–52.0)
HEMATOCRIT: 21.6 % — AB (ref 39.0–52.0)
Hemoglobin: 6.6 g/dL — CL (ref 13.0–17.0)
Hemoglobin: 8.1 g/dL — ABNORMAL LOW (ref 13.0–17.0)
MCH: 28.2 pg (ref 26.0–34.0)
MCH: 28.8 pg (ref 26.0–34.0)
MCHC: 30.6 g/dL (ref 30.0–36.0)
MCHC: 31.6 g/dL (ref 30.0–36.0)
MCV: 92.3 fL (ref 78.0–100.0)
MCV: 91.1 fL (ref 78.0–100.0)
PLATELETS: 450 10*3/uL — AB (ref 150–400)
Platelets: 413 10*3/uL — ABNORMAL HIGH (ref 150–400)
RBC: 2.34 MIL/uL — AB (ref 4.22–5.81)
RBC: 2.81 MIL/uL — ABNORMAL LOW (ref 4.22–5.81)
RDW: 15.2 % (ref 11.5–15.5)
RDW: 15.3 % (ref 11.5–15.5)
WBC: 14.5 10*3/uL — AB (ref 4.0–10.5)
WBC: 16.9 10*3/uL — ABNORMAL HIGH (ref 4.0–10.5)

## 2017-05-14 LAB — HEPARIN LEVEL (UNFRACTIONATED): Heparin Unfractionated: 0.48 IU/mL (ref 0.30–0.70)

## 2017-05-14 LAB — PREPARE RBC (CROSSMATCH)

## 2017-05-14 LAB — PHOSPHORUS: PHOSPHORUS: 6.8 mg/dL — AB (ref 2.5–4.6)

## 2017-05-14 LAB — TRIGLYCERIDES: TRIGLYCERIDES: 165 mg/dL — AB (ref <150)

## 2017-05-14 LAB — MAGNESIUM: Magnesium: 2.3 mg/dL (ref 1.7–2.4)

## 2017-05-14 LAB — ABO/RH: ABO/RH(D): O POS

## 2017-05-14 MED ORDER — SODIUM BICARBONATE 650 MG PO TABS
1300.0000 mg | ORAL_TABLET | Freq: Four times a day (QID) | ORAL | Status: DC
  Administered 2017-05-14 – 2017-05-16 (×7): 1300 mg via ORAL
  Filled 2017-05-14 (×9): qty 2

## 2017-05-14 MED ORDER — ROCURONIUM BROMIDE 50 MG/5ML IV SOLN
50.0000 mg | Freq: Once | INTRAVENOUS | Status: AC
  Administered 2017-05-14: 50 mg via INTRAVENOUS

## 2017-05-14 MED ORDER — ETOMIDATE 2 MG/ML IV SOLN
40.0000 mg | Freq: Once | INTRAVENOUS | Status: AC
  Administered 2017-05-14: 40 mg via INTRAVENOUS

## 2017-05-14 MED ORDER — VECURONIUM BROMIDE 10 MG IV SOLR
10.0000 mg | Freq: Once | INTRAVENOUS | Status: DC
  Filled 2017-05-14 (×3): qty 10

## 2017-05-14 MED ORDER — PROPOFOL 1000 MG/100ML IV EMUL
0.0000 ug/kg/min | INTRAVENOUS | Status: DC
  Administered 2017-05-14: 5 ug/kg/min via INTRAVENOUS
  Administered 2017-05-14 – 2017-05-15 (×6): 50 ug/kg/min via INTRAVENOUS
  Administered 2017-05-16: 30 ug/kg/min via INTRAVENOUS
  Administered 2017-05-16: 35 ug/kg/min via INTRAVENOUS
  Filled 2017-05-14 (×11): qty 100

## 2017-05-14 MED ORDER — FENTANYL CITRATE (PF) 100 MCG/2ML IJ SOLN
INTRAMUSCULAR | Status: AC
  Filled 2017-05-14: qty 2

## 2017-05-14 MED ORDER — FENTANYL CITRATE (PF) 100 MCG/2ML IJ SOLN: 200.0000 ug | Freq: Once | INTRAMUSCULAR | Status: DC

## 2017-05-14 MED ORDER — ETOMIDATE 2 MG/ML IV SOLN
40.0000 mg | Freq: Once | INTRAVENOUS | Status: AC
  Administered 2017-05-15: 40 mg via INTRAVENOUS
  Filled 2017-05-14 (×2): qty 20

## 2017-05-14 MED ORDER — FREE WATER
250.0000 mL | Freq: Three times a day (TID) | Status: DC
  Administered 2017-05-14 – 2017-05-15 (×4): 250 mL

## 2017-05-14 MED ORDER — SODIUM POLYSTYRENE SULFONATE 15 GM/60ML PO SUSP
50.0000 g | Freq: Once | ORAL | Status: DC
  Filled 2017-05-14: qty 240

## 2017-05-14 MED ORDER — SODIUM POLYSTYRENE SULFONATE PO POWD
50.0000 g | Freq: Once | ORAL | Status: AC
  Administered 2017-05-14: 50 g via ORAL
  Filled 2017-05-14: qty 60

## 2017-05-14 MED ORDER — ACETAMINOPHEN 650 MG RE SUPP: 325.0000 mg | RECTAL | Status: DC | PRN

## 2017-05-14 MED ORDER — PROPOFOL 500 MG/50ML IV EMUL
5.0000 ug/kg/min | Freq: Once | INTRAVENOUS | Status: AC
  Filled 2017-05-14 (×2): qty 50

## 2017-05-14 MED ORDER — MIDAZOLAM HCL 2 MG/2ML IJ SOLN
4.0000 mg | Freq: Once | INTRAMUSCULAR | Status: DC
  Filled 2017-05-14: qty 4

## 2017-05-14 MED ORDER — SODIUM CHLORIDE 0.9 % IV SOLN: Freq: Once | INTRAVENOUS | Status: DC

## 2017-05-14 MED ORDER — FENTANYL CITRATE (PF) 100 MCG/2ML IJ SOLN
100.0000 ug | Freq: Once | INTRAMUSCULAR | Status: AC
Start: 2017-05-14 — End: 2017-05-14
  Administered 2017-05-14: 100 ug via INTRAVENOUS

## 2017-05-14 NOTE — Progress Notes (Signed)
CRITICAL VALUE ALERT  Critical Value:  Hgb 6.6  Date & Time Notied:  05/14/17 0555  Provider Notified: Dr Dellie CatholicSommers  Orders Received/Actions taken: 1unit PRBCs

## 2017-05-14 NOTE — Care Management (Signed)
Discussed in LOS 2/26 - pt remains appropriate for continued stay.  LTACH referral given by physician advisor to both [facility].  CM discussed [facility] referral with attending group [facility]pt was extubated today but may require reintubation/trach).  CM will continue to follow and inform both LTACHs

## 2017-05-14 NOTE — Consult Note (Signed)
[facility] Surgery Consult/Admission Note  Joseph Downs [birth year]  [id number].    Requesting MD: Dr. Chuck Hint Chief Complaint/Reason for Consult: PEG  HPI:   Pt is a 67 yo homeless male who was found down on 02/13. Pt presented with hypothermia, shock lactic acidosis, and found to have b/l DVT's, RV strain on ECHO suggestive of massive PE, Acinetobacter in blood and staph in urine with elevated PSA, and AKI. Pt was extubated this AM but had to be reintubated. He is currently sedated. Pt is also hypokalemic and hypernatremic. No family at bedside. No abdominal surgical history on file and no obvious scars present on abdomen. Pt is anticoagulated with heparin.   ROS:  Review of Systems  Unable to perform ROS: Acuity of condition     Family History  Problem Relation Age of Onset  . Cancer Father     No past medical history on file.  Past Surgical History:  Procedure Laterality Date  . IR IVC FILTER PLMT / S&I /IMG GUID/MOD SED  05/10/2017    Social History:  reports that  has never smoked. he has never used smokeless tobacco. He reports that he does not drink alcohol or use drugs.  Allergies: No Known Allergies  Medications Prior to Admission  Medication Sig Dispense Refill  . finasteride (PROSCAR) 5 MG tablet Take 5 mg by mouth daily.    . tamsulosin (FLOMAX) 0.4 MG CAPS capsule Take 1 capsule (0.4 mg total) by mouth daily after supper. (Patient not taking: Reported on 05/01/2017) 30 capsule 1    Blood pressure (!) 115/46, pulse 87, temperature 98.8 F (37.1 C), temperature source Oral, resp. rate 18, height _0  (1.778 m), weight 185 lb 6.5 oz (84.1 kg), SpO2 100 %.  Physical Exam  Constitutional: He is intubated.  Pt is intubated and sedated, thin, elderly, ill looking AA male  HENT:  Head: Normocephalic and atraumatic.  Nose: Nose normal.  Mouth/Throat: Mucous membranes are normal.  ET tube in place  Eyes: Right eye exhibits no discharge. Left eye exhibits  no discharge. No scleral icterus.  Pupils are equal and round  Neck: No thyromegaly present.  Unable to assess ROM  Cardiovascular: Normal rate, regular rhythm, normal heart sounds and intact distal pulses.  No murmur heard. Pulses:      Radial pulses are 2+ on the right side, and 2+ on the left side.  Pulmonary/Chest: Breath sounds normal. He is intubated. He has no wheezes. He has no rhonchi. He has no rales.  Abdominal: Soft. Normal appearance and bowel sounds are normal. He exhibits no distension. There is no hepatosplenomegaly. No hernia.  Musculoskeletal:  Unable to assess, frost bite is noted to b/l feet  Neurological:  Intubated and sedated  Skin: Skin is warm and dry. He is not diaphoretic.  Psychiatric:  Unable to assess  Nursing note and vitals reviewed.   Results for orders placed or performed during the hospital encounter of 05/01/17 (from the past 48 hour(s))  Glucose, capillary     Status: Abnormal   Collection Time: 05/12/17  4:44 PM  Result Value Ref Range   Glucose-Capillary 150 (H) 65 - 99 mg/dL   Comment 1 Capillary Specimen    Comment 2 Notify RN   Glucose, capillary     Status: Abnormal   Collection Time: 05/12/17  7:35 PM  Result Value Ref Range   Glucose-Capillary 146 (H) 65 - 99 mg/dL   Comment 1 Notify RN   Glucose, capillary  Status: Abnormal   Collection Time: 05/13/17 12:18 AM  Result Value Ref Range   Glucose-Capillary 158 (H) 65 - 99 mg/dL   Comment 1 Notify RN   Glucose, capillary     Status: Abnormal   Collection Time: 05/13/17  4:02 AM  Result Value Ref Range   Glucose-Capillary 134 (H) 65 - 99 mg/dL   Comment 1 Notify RN   Heparin level (unfractionated)     Status: Abnormal   Collection Time: 05/13/17  4:30 AM  Result Value Ref Range   Heparin Unfractionated 0.28 (L) 0.30 - 0.70 IU/mL    Comment:        IF HEPARIN RESULTS ARE BELOW EXPECTED VALUES, AND PATIENT DOSAGE HAS BEEN CONFIRMED, SUGGEST FOLLOW UP TESTING OF ANTITHROMBIN  III LEVELS. Performed at [facility], [place] [address] [place], [place] [address]   Basic metabolic panel     Status: Abnormal   Collection Time: 05/13/17  4:30 AM  Result Value Ref Range   Sodium 146 (H) 135 - 145 mmol/L   Potassium 5.5 (H) 3.5 - 5.1 mmol/L   Chloride 115 (H) 101 - 111 mmol/L   CO2 19 (L) 22 - 32 mmol/L   Glucose, Bld 158 (H) 65 - 99 mg/dL   BUN 130 (H) 6 - 20 mg/dL   Creatinine, Ser 5.34 (H) 0.61 - 1.24 mg/dL   Calcium 7.8 (L) 8.9 - 10.3 mg/dL   GFR calc non Af Amer 10 (L) >60 mL/min   GFR calc Af Amer 12 (L) >60 mL/min    Comment: (NOTE) The eGFR has been calculated using the CKD EPI equation. This calculation has not been validated in all clinical situations. eGFR's persistently <60 mL/min signify possible Chronic Kidney Disease.    Anion gap 12 5 - 15    Comment: Performed at [facility] [address] [place], [place] [address]  CBC     Status: Abnormal   Collection Time: 05/13/17  4:30 AM  Result Value Ref Range   WBC 15.2 (H) 4.0 - 10.5 K/uL   RBC 2.46 (L) 4.22 - 5.81 MIL/uL   Hemoglobin 7.0 (L) 13.0 - 17.0 g/dL   HCT 22.4 (L) 39.0 - 52.0 %   MCV 91.1 78.0 - 100.0 fL   MCH 28.0 26.0 - 34.0 pg   MCHC 30.8 30.0 - 36.0 g/dL   RDW 14.9 11.5 - 15.5 %   Platelets 364 150 - 400 K/uL    Comment: Performed at [facility], [place] [address], [place], [place] [address]  Magnesium     Status: Abnormal   Collection Time: 05/13/17  4:30 AM  Result Value Ref Range   Magnesium 2.6 (H) 1.7 - 2.4 mg/dL    Comment: Performed at [facility] [address] [place], [place] [address]  Phosphorus     Status: Abnormal   Collection Time: 05/13/17  4:30 AM  Result Value Ref Range   Phosphorus 7.1 (H) 2.5 - 4.6 mg/dL    Comment: Performed at [facility] [address] [place], [place] [address]  Iron and TIBC     Status: Abnormal   Collection Time: 05/13/17  7:30 AM  Result Value Ref Range   Iron 19 (L) 45 - 182 ug/dL   TIBC 133  (L) 250 - 450 ug/dL   Saturation Ratios 14 (L) 17.9 - 39.5 %   UIBC 114 ug/dL    Comment: Performed at [facility], [address], [place], [place] [address]  Glucose, capillary     Status: Abnormal   Collection Time: 05/13/17  8:10 AM  Result Value Ref Range   Glucose-Capillary 123 (H) 65 - 99 mg/dL   Comment 1 Capillary Specimen    Comment 2 Notify RN   Glucose, capillary     Status: Abnormal   Collection Time: 05/13/17 11:11 AM  Result Value Ref Range   Glucose-Capillary 132 (H) 65 - 99 mg/dL   Comment 1 Capillary Specimen    Comment 2 Notify RN   Renal function panel     Status: Abnormal   Collection Time: 05/13/17  3:32 PM  Result Value Ref Range   Sodium 147 (H) 135 - 145 mmol/L   Potassium 5.8 (H) 3.5 - 5.1 mmol/L   Chloride 117 (H) 101 - 111 mmol/L   CO2 19 (L) 22 - 32 mmol/L   Glucose, Bld 156 (H) 65 - 99 mg/dL   BUN 128 (H) 6 - 20 mg/dL   Creatinine, Ser 5.10 (H) 0.61 - 1.24 mg/dL   Calcium 8.0 (L) 8.9 - 10.3 mg/dL   Phosphorus 7.4 (H) 2.5 - 4.6 mg/dL   Albumin 1.3 (L) 3.5 - 5.0 g/dL   GFR calc non Af Amer 11 (L) >60 mL/min   GFR calc Af Amer 12 (L) >60 mL/min    Comment: (NOTE) The eGFR has been calculated using the CKD EPI equation. This calculation has not been validated in all clinical situations. eGFR's persistently <60 mL/min signify possible Chronic Kidney Disease.    Anion gap 11 5 - 15    Comment: Performed at [facility] [address] [place], [place] [address]  Glucose, capillary     Status: Abnormal   Collection Time: 05/13/17  3:47 PM  Result Value Ref Range   Glucose-Capillary 144 (H) 65 - 99 mg/dL   Comment 1 Capillary Specimen    Comment 2 Notify RN   Glucose, capillary     Status: Abnormal   Collection Time: 05/13/17  8:37 PM  Result Value Ref Range   Glucose-Capillary 145 (H) 65 - 99 mg/dL   Comment 1 Capillary Specimen    Comment 2 Notify RN   Glucose, capillary     Status: Abnormal   Collection Time: 05/14/17 12:03 AM   Result Value Ref Range   Glucose-Capillary 138 (H) 65 - 99 mg/dL   Comment 1 Capillary Specimen    Comment 2 Notify RN   Blood gas, arterial     Status: Abnormal   Collection Time: 05/14/17  3:48 AM  Result Value Ref Range   FIO2 30.00    Delivery systems VENTILATOR    Mode PRESSURE REGULATED VOLUME CONTROL    VT 580 mL   LHR 18 resp/min   Peep/cpap 5.0 cm H20   pH, Arterial 7.335 (L) 7.350 - 7.450   pCO2 arterial 36.6 32.0 - 48.0 mmHg   pO2, Arterial 141 (H) 83.0 - 108.0 mmHg   Bicarbonate 19.0 (L) 20.0 - 28.0 mmol/L   Acid-base deficit 5.8 (H) 0.0 - 2.0 mmol/L   O2 Saturation 98.4 %   Patient temperature 98.6    Collection site A-LINE    Drawn by [phone]    Sample type ARTERIAL DRAW    Allens test (pass/fail) PASS PASS  Glucose, capillary     Status: Abnormal   Collection Time: 05/14/17  3:52 AM  Result Value Ref Range   Glucose-Capillary 140 (H) 65 -  99 mg/dL   Comment 1 Capillary Specimen    Comment 2 Notify RN   Heparin level (unfractionated)     Status: None   Collection Time: 05/14/17  4:39 AM  Result Value Ref Range   Heparin Unfractionated 0.48 0.30 - 0.70 IU/mL    Comment:        IF HEPARIN RESULTS ARE BELOW EXPECTED VALUES, AND PATIENT DOSAGE HAS BEEN CONFIRMED, SUGGEST FOLLOW UP TESTING OF ANTITHROMBIN III LEVELS. Performed at [facility], [place] [address], [place], [place] [address]   CBC     Status: Abnormal   Collection Time: 05/14/17  4:39 AM  Result Value Ref Range   WBC 14.5 (H) 4.0 - 10.5 K/uL   RBC 2.34 (L) 4.22 - 5.81 MIL/uL   Hemoglobin 6.6 (LL) 13.0 - 17.0 g/dL    Comment: REPEATED TO VERIFY CRITICAL RESULT CALLED TO, READ BACK BY AND VERIFIED WITH: Delaine Lame [id number] 05/14/2017 T. TYSOR    HCT 21.6 (L) 39.0 - 52.0 %   MCV 92.3 78.0 - 100.0 fL   MCH 28.2 26.0 - 34.0 pg   MCHC 30.6 30.0 - 36.0 g/dL   RDW 15.2 11.5 - 15.5 %   Platelets 413 (H) 150 - 400 K/uL    Comment: Performed at [facility], [place] [address]  [place], [place] [address]  Basic metabolic panel     Status: Abnormal   Collection Time: 05/14/17  4:39 AM  Result Value Ref Range   Sodium 151 (H) 135 - 145 mmol/L   Potassium 5.6 (H) 3.5 - 5.1 mmol/L   Chloride 120 (H) 101 - 111 mmol/L   CO2 19 (L) 22 - 32 mmol/L   Glucose, Bld 156 (H) 65 - 99 mg/dL   BUN 125 (H) 6 - 20 mg/dL   Creatinine, Ser 4.77 (H) 0.61 - 1.24 mg/dL   Calcium 8.1 (L) 8.9 - 10.3 mg/dL   GFR calc non Af Amer 12 (L) >60 mL/min   GFR calc Af Amer 13 (L) >60 mL/min    Comment: (NOTE) The eGFR has been calculated using the CKD EPI equation. This calculation has not been validated in all clinical situations. eGFR's persistently <60 mL/min signify possible Chronic Kidney Disease.    Anion gap 12 5 - 15    Comment: Performed at [facility] [place] [address] [place], [place] [address]  Magnesium     Status: None   Collection Time: 05/14/17  4:39 AM  Result Value Ref Range   Magnesium 2.3 1.7 - 2.4 mg/dL    Comment: Performed at [facility] [address], [place], [place] [address]  Phosphorus     Status: Abnormal   Collection Time: 05/14/17  4:39 AM  Result Value Ref Range   Phosphorus 6.8 (H) 2.5 - 4.6 mg/dL    Comment: Performed at [facility] [address], [place], [place] [address]  Renal function panel     Status: Abnormal   Collection Time: 05/14/17  4:42 AM  Result Value Ref Range   Sodium 151 (H) 135 - 145 mmol/L   Potassium 5.6 (H) 3.5 - 5.1 mmol/L   Chloride 120 (H) 101 - 111 mmol/L   CO2 19 (L) 22 - 32 mmol/L   Glucose, Bld 156 (H) 65 - 99 mg/dL   BUN 125 (H) 6 - 20 mg/dL   Creatinine, Ser 4.77 (H) 0.61 - 1.24 mg/dL   Calcium 8.1 (L) 8.9 -  10.3 mg/dL   Phosphorus 6.8 (H) 2.5 - 4.6 mg/dL   Albumin 1.3 (L) 3.5 - 5.0 g/dL   GFR calc non Af Amer 12 (L) >60 mL/min   GFR calc Af Amer 13 (L) >60 mL/min    Comment: (NOTE) The eGFR has been calculated using the CKD EPI equation. This calculation has not been validated in all clinical  situations. eGFR's persistently <60 mL/min signify possible Chronic Kidney Disease.    Anion gap 12 5 - 15    Comment: Performed at [facility] [address], [place], [place] [address]  Type and screen [place]     Status: None (Preliminary result)   Collection Time: 05/14/17  6:15 AM  Result Value Ref Range   ABO/RH(D) O POS    Antibody Screen NEG    Sample Expiration 05/17/2017    Unit Number [id number]    Blood Component Type RED CELLS,LR    Unit division 00    Status of Unit ISSUED    Transfusion Status OK TO TRANSFUSE    Crossmatch Result      Compatible Performed at [facility], [place] [address], [place], [place] [address]   Prepare RBC     Status: None   Collection Time: 05/14/17  6:15 AM  Result Value Ref Range   Order Confirmation      ORDER PROCESSED BY BLOOD BANK Performed at [facility], [place] [address] [place], [place] [address]   ABO/Rh     Status: None   Collection Time: 05/14/17  6:15 AM  Result Value Ref Range   ABO/RH(D)      O POS Performed at [facility] [address] [place], [place] [address]   Glucose, capillary     Status: Abnormal   Collection Time: 05/14/17  8:12 AM  Result Value Ref Range   Glucose-Capillary 157 (H) 65 - 99 mg/dL   Comment 1 Capillary Specimen    Comment 2 Notify RN    Dg Abd 1 View  Result Date: 05/14/2017 CLINICAL DATA:  OG tube placement. EXAM: ABDOMEN - 1 VIEW COMPARISON:  05/11/2017. FINDINGS: OG tube noted with tip in the upper stomach and side hole at the gastroesophageal junction. Advancement of approximately 8 cm should be considered. IVC filter noted with tip at the L2 level. Slightly prominent air-filled loops of small and large bowel noted. Adynamic ileus could present this fashion. IMPRESSION: OG tube noted with tip in the upper stomach and side hole at the gastroesophageal junction. Advancement of approximately 8 cm should be considered. Persistent slightly  prominent air-filled loops of small and large bowel. Electronically Signed   By: Marcello Moores  Register   On: 05/14/2017 12:42   Dg Chest Port 1 View  Result Date: 05/14/2017 CLINICAL DATA:  ET tube placement. EXAM: PORTABLE CHEST 1 VIEW COMPARISON:  05/14/2017 FINDINGS: Endotracheal tube with the tip 4.2 cm above the carina. Nasogastric tube coursing below the diaphragm. Right jugular central venous catheter with the tip projecting over the SVC. Left jugular central venous catheter with the tip projecting over the SVC. Small right pleural effusion with right basilar airspace disease likely reflecting atelectasis. Mild left basilar atelectasis. No pneumothorax. Stable cardiomediastinal silhouette. No acute osseous abnormality. IMPRESSION: 1. Support lines and tubing in satisfactory position. 2. Small right pleural effusion.  Bibasilar atelectasis. Electronically Signed   By: Kathreen Devoid   On: 05/14/2017 12:42   Dg Chest Port 1 [address]  Result Date: 05/14/2017 CLINICAL DATA:  Hypoxia EXAM: PORTABLE CHEST 1 VIEW COMPARISON:  May 13, 2017. FINDINGS: Endotracheal tube tip is 4.9 cm above the carina. There is a right jugular catheter with tip in superior vena cava. Left jugular catheter tip is also in the superior vena cava. Nasogastric tube tip and side port are below the diaphragm. No pneumothorax. There is bibasilar atelectatic change. Lungs elsewhere are clear. Heart size and pulmonary vascularity are normal. No adenopathy. No bone lesions. IMPRESSION: Tube and catheter positions as described without pneumothorax. Bibasilar atelectasis. Lungs elsewhere clear. Heart size normal. Electronically Signed   By: Lowella Grip III M.D.   On: 05/14/2017 07:51   Dg Chest Port 1 View  Result Date: 05/13/2017 CLINICAL DATA:  Acute respiratory failure EXAM: PORTABLE CHEST 1 VIEW COMPARISON:  Yesterday FINDINGS: Endotracheal tube tip between the clavicular heads and carina. An orogastric tube reaches the stomach.  Bilateral IJ catheters with tips at the upper SVC. Platelike opacity at the right more than left base, stable. No Kerley lines, effusion, or pneumothorax. Interface overlapping the posterior right third rib is stable and osseous based on priors. IMPRESSION: 1. Stable positioning of hardware. 2. Stable atelectasis at the bases. Electronically Signed   By: Monte Fantasia M.D.   On: 05/13/2017 07:08      Assessment/Plan  Active Problems:   Lactic acidemia   Acute respiratory failure (HCC)   Exposure to weather condition   Hypotension   Non-ST elevation (NSTEMI) myocardial infarction (HCC)   Malnutrition of moderate degree   Acute cystitis with hematuria   Sepsis ([place])   Acute deep vein thrombosis (DVT) of proximal vein of both lower extremities (HCC)   Pressure injury of skin   Cardiogenic shock ([place])   Acute cor pulmonale (HCC)   Acute respiratory failure with hypoxemia (HCC)  PEG - likely will do PEG on Thursday. Will discuss timing with MD  Thank you for the consult.   Kalman Drape, [facility] 05/14/2017, 2:00 PM Pager: [phone] Consults: [phone] Mon-Fri 7:00 am-4:30 pm Sat-Sun 7:00 am-11:30 am

## 2017-05-14 NOTE — Progress Notes (Signed)
eLink Physician-Brief Progress Note Patient Name: Joseph FloridaLarry E Allport DOB: 1951-02-01 MRN: [id number]   Date of Service  05/14/2017  HPI/Events of Note  Anemia - Hgb = 6.6.   eICU Interventions  Will transfuse 1 unit PRBC now.      Intervention Category Major Interventions: Other:  Sommer,Steven Dennard Nipugene 05/14/2017, 5:57 AM

## 2017-05-14 NOTE — Procedures (Signed)
OGT Placed under direct laryngoscopy and verified by auscultation  Alyson ReedyWesam G. Yacoub, M.D. [facility]Critical Care Medicine. Pager: [phone]. After hours pager: [phone].

## 2017-05-14 NOTE — Progress Notes (Signed)
   VASCULAR SURGERY ASSESSMENT & PLAN:   Toes on both feet are stable. Almost off of Levophed  Wounds on feet (sloughing) need continued aggressive wound care.  Renal function stable. Currently not on HD.  SUBJECTIVE:   Sedated on vent.   PHYSICAL EXAM:   Vitals:   05/14/17 0824 05/14/17 0826 05/14/17 0921 05/14/17 0924  BP:  (!) 129/53 (!) 103/54   Pulse:  75 84   Resp:  (!) 23 (!) 26   Temp:   99.5 F (37.5 C)   TempSrc:   Oral   SpO2: 100% 100%  100%  Weight:      Height:       Toes and wounds on both feet are unchanged   LABS:   Lab Results  Component Value Date   WBC 14.5 (H) 05/14/2017   HGB 6.6 (LL) 05/14/2017   HCT 21.6 (L) 05/14/2017   MCV 92.3 05/14/2017   PLT 413 (H) 05/14/2017   Lab Results  Component Value Date   CREATININE 4.77 (H) 05/14/2017   Lab Results  Component Value Date   INR 1.21 05/10/2017   CBG (last 3)  Recent Labs    05/14/17 0003 05/14/17 0352 05/14/17 0812  GLUCAP 138* 140* 157*    PROBLEM LIST:    Active Problems:   Lactic acidemia   Acute respiratory failure (HCC)   Exposure to weather condition   Hypotension   Non-ST elevation (NSTEMI) myocardial infarction (HCC)   Malnutrition of moderate degree   Acute cystitis with hematuria   Sepsis ([place])   Acute deep vein thrombosis (DVT) of proximal vein of both lower extremities (HCC)   Pressure injury of skin   Cardiogenic shock (HCC)   Acute cor pulmonale (HCC)   CURRENT MEDS:   . aspirin  81 mg Per Tube Daily  . chlorhexidine gluconate (MEDLINE KIT)  15 mL Mouth Rinse BID  . Chlorhexidine Gluconate Cloth  6 each Topical Daily  . clonazepam  1 mg Per Tube BID  . free water  250 mL Per Tube Q8H  . insulin aspart  2-6 Units Subcutaneous Q4H  . ipratropium-albuterol  3 mL Nebulization Q6H  . mouth rinse  15 mL Mouth Rinse QID  . pantoprazole (PROTONIX) IV  40 mg Intravenous QHS  . QUEtiapine  100 mg Oral BID  . silver sulfADIAZINE   Topical Daily  . sodium  bicarbonate  1,300 mg Oral QID  . sodium chloride flush  10-40 mL Intracatheter Q12H  . sodium polystyrene  50 g Oral Once  . thiamine injection  100 mg Intravenous Daily    Deitra Mayo Beeper: [phone] Office: [phone] 05/14/2017

## 2017-05-14 NOTE — Procedures (Signed)
Extubation Procedure Note  Patient Details:   Name: Joseph FloridaLarry E Damiani DOB: [birth year] MRN: [id number]     Evaluation  O2 sats: stable throughout Complications: No apparent complications Patient did tolerate procedure well. Bilateral Breath Sounds: Clear, Diminished   Pt extubated per MD order.  Pt had + cuff leak.  Placed on 4L [place].   Cherylin MylarDoyle, Mertha Clyatt 05/14/2017, 10:57 AM

## 2017-05-14 NOTE — Progress Notes (Addendum)
PULMONARY / CRITICAL CARE MEDICINE   Name: Joseph FloridaLarry E Andreoni MRN: [id number] DOB: 01-Apr-1950    ADMISSION DATE:  05/01/2017 CONSULTATION DATE:  05/01/17  REFERRING MD:  Dr. Deretha EmoryZackowski  CHIEF COMPLAINT:  Found down  HISTORY OF PRESENT ILLNESS:   67 year old homeless man found down/13 with hypothermia, shock lactic acidosis. Found to have bilateral DVTs and evidence of RV strain on echo He had Acinetobacter in blood and staph in urine with elevated PSA  STUDIES:  ECHO 2/14 - EF 65-70%, G1DD, RV strain Echo 2/21 - EF 55-60%, RV improved  CULTURES: MRSA PCR negative Ucx + staph lugdenensis Blood cx 2/13 >> Diphtheroids + Acinetobacter lwoffii Blood cx 2/19 >>NGTD Blood cx 2/20 >> NGTD  ANTIBIOTICS: Zosyn 2/13> 2/18 Vanc 2/13 >>2/19 Meropenem 2/18 >>  SIGNIFICANT EVENTS: 2/13 admitted, intubated 2/22 IVC filter placed 2/25 1u PRBC  LINES/TUBES: ETT 2/13 >>  Foley 2/13 OGT 2/13 Right IJ HD catheter 2/15 >> Left IJ CVC 2/15 >>  SUBJECTIVE:  Unable to be weaned from vent due to agitation 2/25, 1 u prbc ordered overnight, not given yet. Remains on fentanyl, precedex, levophed, meropenem, heparin.   VITAL SIGNS: BP 118/62 (BP Location: Right Arm)   Pulse 81   Temp 98.7 F (37.1 C) (Oral)   Resp 19   Ht 5\' 10"  (1.778 m)   Wt 185 lb 6.5 oz (84.1 kg)   SpO2 100%   BMI 26.60 kg/m   HEMODYNAMICS:    VENTILATOR SETTINGS: Vent Mode: PRVC FiO2 (%):  [30 %] 30 % Set Rate:  [18 bmp] 18 bmp Vt Set:  [580 mL] 580 mL PEEP:  [5 cmH20] 5 cmH20 Pressure Support:  [5 cmH20] 5 cmH20 Plateau Pressure:  [16 cmH20-23 cmH20] 17 cmH20  INTAKE / OUTPUT: I/O last 3 completed shifts: In: 5155.3 [I.V.:2179.1; NG/GT:2876.3; IV Piggyback:100] Out: 5205 [Urine:5005; Stool:200]  PHYSICAL EXAMINATION: General: Ill-appearing man, lying in bed, sedated, intubated Neuro: moves all extremities HEENT: oral ETT , no oral lesions Cardiovascular: RRR, no MRG Lungs: CTAB Abdomen: Soft,  benign, positive bowel sounds Musculoskeletal: Bilateral lower extremity pitting edema, feet wrapped bilaterally Skin: skin sloughing feet bilaterally , dry gangrene toes  LABS:  BMET Recent Labs  Lab 05/13/17 1532 05/14/17 0439 05/14/17 0442  NA 147* 151* 151*  K 5.8* 5.6* 5.6*  CL 117* 120* 120*  CO2 19* 19* 19*  BUN 128* 125* 125*  CREATININE 5.10* 4.77* 4.77*  GLUCOSE 156* 156* 156*    Electrolytes Recent Labs  Lab 05/09/17 0608  05/13/17 0430 05/13/17 1532 05/14/17 0439 05/14/17 0442  CALCIUM 7.5*   < > 7.8* 8.0* 8.1* 8.1*  MG 2.4  --  2.6*  --  2.3  --   PHOS 7.0*  --  7.1* 7.4* 6.8* 6.8*   < > = values in this interval not displayed.    CBC Recent Labs  Lab 05/12/17 0301 05/13/17 0430 05/14/17 0439  WBC 14.9* 15.2* 14.5*  HGB 7.3* 7.0* 6.6*  HCT 23.3* 22.4* 21.6*  PLT 279 364 413*    Coag's Recent Labs  Lab 05/10/17 0650  INR 1.21    Sepsis Markers Recent Labs  Lab 05/10/17 1112 05/11/17 0345 05/12/17 0301  PROCALCITON 4.78 4.23 3.65    ABG Recent Labs  Lab 05/14/17 0348  PHART 7.335*  PCO2ART 36.6  PO2ART 141*    Liver Enzymes Recent Labs  Lab 05/10/17 0814 05/13/17 1532 05/14/17 0442  AST 68*  --   --   ALT 120*  --   --  ALKPHOS 103  --   --   BILITOT 0.4  --   --   ALBUMIN 1.4* 1.3* 1.3*    Cardiac Enzymes No results for input(s): TROPONINI, PROBNP in the last 168 hours.  Glucose Recent Labs  Lab 05/13/17 0810 05/13/17 1111 05/13/17 1547 05/13/17 2037 05/14/17 0003 05/14/17 0352  GLUCAP 123* 132* 144* 145* 138* 140*    Imaging No results found. DISCUSSION: Bilateral lower extremity DVT, right heart strain by echocardiogram very suggestive of massive PE.  He had polymicrobial bacteremia, no evidence of valvular disease by echo, not yet speciated.  Also with Staph lugdunensis UTI (often associated with prostate cancer). PSA elevated. Acinetobacter in blood could be contaminant or related to  cellulitis AKI -off  CVVHD.   Current issues are vent dependence & worsening AKI  ASSESSMENT / PLAN:  PULMONARY A: Acute respiratory failure on ventilator Likely pulmonary embolism with associated right heart strain P:   Continues to fail SBT due to agitation Will need to consider trach at this point Continue heparin infusion for PE  CARDIOVASCULAR A:  Stress related non-ST elevation MI, suspect right heart strain Acute Cor pulmonale P:  Cards has signed off for now He will require an ischemic evaluation once stable  RENAL A:   Acute non-oliguric renal failure-UO remains good Hyperkalemia- resolved Lactic acidosis- resolved Rhabdomyolysis-improving P:   Nephrology following, appreciate recommendations Follow BMP, urine output Continue IV lasix  GASTROINTESTINAL A:   NPO Elevated LFTs, suspect shock liver - resolved P:   Continue PPI Continue tube feeding  HEMATOLOGIC / ONC A:   Bilateral DVTs S/p IVC filter Leukocytosis Anemia Elevated PSA, Staph lugdunensis both suggestive of possible prostatic CA Thrombocytopenia- resolved P:  Continue heparin drip Follow CBC Transfusion threshold < 7 -1 u PRBC ordered 2/26 He will need further evaluation for possible prostate cancer once he stabilizes from his critical illness  INFECTIOUS A:   RLL infiltrate on CXR UTI-  >100,000 colonies staph lugdenenis s/p 7 days of vanc Acinetobacter lwoffii bacteremia. Persistent fever  P:   continue meropenem x 2 weeks- on Day # 9 Wound care for his bilateral foot wounds Trend WBC, fevers  ENDOCRINE A:   hyperglycemia P:   Follow CBG Sliding scale insulin per protocol  WOUND CARE A: Bilateral feet with evidence for skin sloughing P: Continue current dressing changes Vascular surgery following, defer arteriogram for now due to poor renal function  NEUROLOGIC A:   Toxic metabolic encephalopathy Sedation for ventilator tolerance P:   RASS goal: -1 Ct   Fentanyl &  Precedex ct klonopin 1 bid  Continue seroquel 100 mg BID for agitation  Versed prn   FAMILY  - Updates: Mother in [place], sister in [place], no family at bedside 2/26 - Inter-disciplinary family meet or Palliative Care meeting due by: done  Dolores Patty, DO PGY-2, [facility] 05/14/2017 7:40 AM  Attending Note:  67 year old male with extensive PMH who presents to the hospital with vascular compromise of both legs.  On exam, patient is following some commands and weaning but RR is elevated.  I reviewed CXR myself, ETT is in good position.  Discussed with PCCM-NP.  Will trial extubate today, if fails then will reintubate and trach.  Will get consent from family for AM.  Hold lasix for today.  Replace electrolytes as indicated.  Continue abx.  Cardiology signed off, appreciate input from [facility].  The patient is critically ill with multiple organ systems failure and requires  high complexity decision making for assessment and support, frequent evaluation and titration of therapies, application of advanced monitoring technologies and extensive interpretation of multiple databases.   Critical Care Time devoted to patient care services described in this note is  35  Minutes. This time reflects time of care of this signee Dr [name]. This critical care time does not reflect procedure time, or teaching time or supervisory time of PA/NP/Med student/Med Resident etc but could involve care discussion time.  Rush Farmer, M.D. [facility] Pulmonary/Critical Care Medicine. Pager: [phone]. After hours pager: [phone].

## 2017-05-14 NOTE — Procedures (Signed)
Intubation Procedure Note Joseph Downs [id number] [birth year]  Procedure: Intubation Indications: Respiratory insufficiency  Procedure Details Consent: Risks of procedure as well as the alternatives and risks of each were explained to the (patient/caregiver).  Consent for procedure obtained. Time Out: Verified patient identification, verified procedure, site/side was marked, verified correct patient position, special equipment/implants available, medications/allergies/relevent history reviewed, required imaging and test results available.  Performed  Maximum sterile technique was used including gloves, hand hygiene and mask.  MAC    Evaluation Hemodynamic Status: BP stable throughout; O2 sats: stable throughout Patient's Current Condition: stable Complications: No apparent complications Patient did tolerate procedure well. Chest X-ray ordered to verify placement.  CXR: pending.   Joseph Downs 05/14/2017

## 2017-05-14 NOTE — Progress Notes (Signed)
Subjective: Interval History: on vent , not coop but moves  Objective: Vital signs in last 24 hours: Temp:  [98.2 F (36.8 C)-102.5 F (39.2 C)] 98.7 F (37.1 C) (02/26 0450) Pulse Rate:  [74-94] 81 (02/26 0600) Resp:  [17-26] 19 (02/26 0600) BP: (87-134)/(35-73) 118/62 (02/26 0600) SpO2:  [97 %-100 %] 100 % (02/26 0600) Arterial Line BP: (93-132)/(34-58) 120/56 (02/26 0600) FiO2 (%):  [30 %] 30 % (02/26 0600) Weight:  [84.1 kg (185 lb 6.5 oz)] 84.1 kg (185 lb 6.5 oz) (02/26 0450) Weight change: -4 kg (-13.1 oz)  Intake/Output from previous day: 02/25 0701 - 02/26 0700 In: 3355.5 [I.V.:1479.2; NG/GT:1776.3; IV Piggyback:100] Out: 3280 [Urine:3080; Stool:200] Intake/Output this shift: No intake/output data recorded.  General appearance: no distress and sedated, on vent Resp: rales RLL Cardio: S1, S2 normal and systolic murmur: holosystolic 2/6, blowing at apex GI: soft, non-tender; bowel sounds normal; no masses,  no organomegaly Extremities: edema 2-3+  Lab Results: Recent Labs    05/13/17 0430 05/14/17 0439  WBC 15.2* 14.5*  HGB 7.0* 6.6*  HCT 22.4* 21.6*  PLT 364 413*   BMET:  Recent Labs    05/14/17 0439 05/14/17 0442  NA 151* 151*  K 5.6* 5.6*  CL 120* 120*  CO2 19* 19*  GLUCOSE 156* 156*  BUN 125* 125*  CREATININE 4.77* 4.77*  CALCIUM 8.1* 8.1*   No results for input(s): PTH in the last 72 hours. Iron Studies:  Recent Labs    05/13/17 0730  IRON 19*  TIBC 133*    Studies/Results: Dg Chest Port 1 View  Result Date: 05/13/2017 CLINICAL DATA:  Acute respiratory failure EXAM: PORTABLE CHEST 1 VIEW COMPARISON:  Yesterday FINDINGS: Endotracheal tube tip between the clavicular heads and carina. An orogastric tube reaches the stomach. Bilateral IJ catheters with tips at the upper SVC. Platelike opacity at the right more than left base, stable. No Kerley lines, effusion, or pneumothorax. Interface overlapping the posterior right third rib is stable and  osseous based on priors. IMPRESSION: 1. Stable positioning of hardware. 2. Stable atelectasis at the bases. Electronically Signed   By: Marnee SpringJonathon  Watts M.D.   On: 05/13/2017 07:08    I have reviewed the patient's current medications.  Assessment/Plan: 1 AKI Cr stable but acidemic and ^ K.  Kirtland Bouchard^K with TF and transfusion. Will treat conservatively..  I and O equal .  If stable to better diurese.  Hold off lasix at this time with Kermit Balo^SNa. 2 VDRFper CCM 3 PE on Hep 4 DVT 5 mild rhabdo 6 Sepsis backon low dose pressors 7 Anemia P free water, bicarb, kayex, vent, AB.     LOS: 13 days   Joseph Downs 05/14/2017,7:42 AM

## 2017-05-14 NOTE — Progress Notes (Signed)
ANTICOAGULATION CONSULT NOTE  Pharmacy Consult for Heparin  Indication: chest pain/ACS / new BL DVTs/possible PE  No Known Allergies  Patient Measurements: Height: 5\' 10"  (177.8 cm) Weight: 185 lb 6.5 oz (84.1 kg) IBW/kg (Calculated) : 73  Vital Signs: Temp: 100.8 F (38.2 C) (02/26 0917) Temp Source: Oral (02/26 0917) BP: 129/53 (02/26 0826) Pulse Rate: 75 (02/26 0826)  Labs: Recent Labs    05/12/17 0301 05/13/17 0430 05/13/17 1532 05/14/17 0439 05/14/17 0442  HGB 7.3* 7.0*  --  6.6*  --   HCT 23.3* 22.4*  --  21.6*  --   PLT 279 364  --  413*  --   HEPARINUNFRC 0.37 0.28*  --  0.48  --   CREATININE 5.50* 5.34* 5.10* 4.77* 4.77*     Assessment: [age] y/o M in the ICU after being "found down".Patient has been found to have bilateral lower extremity with concern for possible PE.  Heparin level is therapeutic this AM at 0.48 on 1600 units/hr. CBC is down but platelets are stable. No signs of bleeding have been noted.   Goal of Therapy:  Heparin level 0.3-0.7 units/ml Monitor platelets by anticoagulation protocol: Yes   Plan:  Continue  Heparin at 1600 units/hr Daily heparin level and CBC Monitor for signs/symptoms of bleeding  Sharin MonsEmily Sinclair, PharmD, BCPS PGY2 Infectious Diseases Pharmacy Resident Pager: [phone]  05/14/2017 9:19 AM

## 2017-05-15 ENCOUNTER — Inpatient Hospital Stay (HOSPITAL_COMMUNITY): Payer: Medicare Other

## 2017-05-15 ENCOUNTER — Encounter (HOSPITAL_COMMUNITY): Payer: Self-pay

## 2017-05-15 DIAGNOSIS — R6521 Severe sepsis with septic shock: Secondary | ICD-10-CM

## 2017-05-15 LAB — TYPE AND SCREEN
ABO/RH(D): O POS
Antibody Screen: NEGATIVE
Unit division: 0

## 2017-05-15 LAB — BPAM RBC
Blood Product Expiration Date: 201903222359
ISSUE DATE / TIME: 201902260840
UNIT TYPE AND RH: 5100

## 2017-05-15 LAB — RENAL FUNCTION PANEL
ANION GAP: 12 (ref 5–15)
Albumin: 1.4 g/dL — ABNORMAL LOW (ref 3.5–5.0)
BUN: 118 mg/dL — ABNORMAL HIGH (ref 6–20)
CHLORIDE: 121 mmol/L — AB (ref 101–111)
CO2: 21 mmol/L — AB (ref 22–32)
CREATININE: 4.57 mg/dL — AB (ref 0.61–1.24)
Calcium: 8 mg/dL — ABNORMAL LOW (ref 8.9–10.3)
GFR calc non Af Amer: 12 mL/min — ABNORMAL LOW (ref >60)
GFR, EST AFRICAN AMERICAN: 14 mL/min — AB (ref >60)
Glucose, Bld: 130 mg/dL — ABNORMAL HIGH (ref 65–99)
POTASSIUM: 5.3 mmol/L — AB (ref 3.5–5.1)
Phosphorus: 7.1 mg/dL — ABNORMAL HIGH (ref 2.5–4.6)
Sodium: 154 mmol/L — ABNORMAL HIGH (ref 135–145)

## 2017-05-15 LAB — HEPARIN LEVEL (UNFRACTIONATED): HEPARIN UNFRACTIONATED: 0.54 [IU]/mL (ref 0.30–0.70)

## 2017-05-15 LAB — GLUCOSE, CAPILLARY
GLUCOSE-CAPILLARY: 130 mg/dL — AB (ref 65–99)
GLUCOSE-CAPILLARY: 126 mg/dL — AB (ref 65–99)
Glucose-Capillary: 107 mg/dL — ABNORMAL HIGH (ref 65–99)
Glucose-Capillary: 112 mg/dL — ABNORMAL HIGH (ref 65–99)
Glucose-Capillary: 119 mg/dL — ABNORMAL HIGH (ref 65–99)
Glucose-Capillary: 124 mg/dL — ABNORMAL HIGH (ref 65–99)
Glucose-Capillary: 128 mg/dL — ABNORMAL HIGH (ref 65–99)
Glucose-Capillary: 134 mg/dL — ABNORMAL HIGH (ref 65–99)

## 2017-05-15 LAB — CBC
HCT: 23.7 % — ABNORMAL LOW (ref 39.0–52.0)
HEMOGLOBIN: 7.3 g/dL — AB (ref 13.0–17.0)
MCH: 28.7 pg (ref 26.0–34.0)
MCHC: 30.8 g/dL (ref 30.0–36.0)
MCV: 93.3 fL (ref 78.0–100.0)
Platelets: 425 10*3/uL — ABNORMAL HIGH (ref 150–400)
RBC: 2.54 MIL/uL — AB (ref 4.22–5.81)
RDW: 16 % — ABNORMAL HIGH (ref 11.5–15.5)
WBC: 13.1 10*3/uL — AB (ref 4.0–10.5)

## 2017-05-15 MED ORDER — ROCURONIUM BROMIDE 50 MG/5ML IV SOLN
1.0000 mg/kg | Freq: Once | INTRAVENOUS | Status: DC
  Filled 2017-05-15: qty 8.62

## 2017-05-15 MED ORDER — FREE WATER
400.0000 mL | Freq: Four times a day (QID) | Status: DC
  Administered 2017-05-16 – 2017-05-21 (×20): 400 mL

## 2017-05-15 MED ORDER — FREE WATER
300.0000 mL | Freq: Four times a day (QID) | Status: DC
  Administered 2017-05-15: 300 mL

## 2017-05-15 MED ORDER — FREE WATER: 250.0000 mL | Freq: Four times a day (QID) | Status: DC

## 2017-05-15 MED ORDER — HEPARIN (PORCINE) IN NACL 100-0.45 UNIT/ML-% IJ SOLN
1600.0000 [IU]/h | INTRAMUSCULAR | Status: AC
  Filled 2017-05-15 (×2): qty 250

## 2017-05-15 NOTE — Procedures (Signed)
Percutaneous Tracheostomy Placement  Consent from family.  Patient sedated, paralyzed and position.  Placed on 100% FiO2 and RR matched.  Area cleaned and draped.  Lidocaine/epi injected.  Skin incision done followed by blunt dissection.  Trachea palpated then punctured, catheter passed and visualized bronchoscopically.  Wire placed and visualized.  Catheter removed.  Airway then crushed and dilated.  Size 6 cuffed shiley trach placed and visualized bronchoscopically well above carina.  Good volume returns.  Patient tolerated the procedure well without complications.  Minimal blood loss.  CXR ordered and pending.  Wesam G. Yacoub, M.D. [place] Pulmonary/Critical Care Medicine. Pager: [phone]. After hours pager: [phone]. 

## 2017-05-15 NOTE — Progress Notes (Signed)
Subjective: Interval History: sedated on vent, family at bedside, know about need for trach, PEG.  Objective: Vital signs in last 24 hours: Temp:  [98 F (36.7 C)-100.3 F (37.9 C)] 98.5 F (36.9 C) (02/27 0438) Pulse Rate:  [75-118] 103 (02/27 0600) Resp:  [18-29] 22 (02/27 0600) BP: (98-129)/(46-69) 112/64 (02/27 0600) SpO2:  [94 %-100 %] 99 % (02/27 0600) Arterial Line BP: (104-246)/(43-214) 121/52 (02/27 0600) FiO2 (%):  [30 %-96 %] 30 % (02/27 0324) Weight:  [86.2 kg (190 lb 0.6 oz)] 86.2 kg (190 lb 0.6 oz) (02/27 0439) Weight change: 2.1 kg (4 lb 10.1 oz)  Intake/Output from previous day: 02/26 0701 - 02/27 0700 In: 3100.5 [I.V.:1584.2; Blood:315; NG/GT:1101.3; IV Piggyback:100] Out: 3175 [Urine:3175] Intake/Output this shift: No intake/output data recorded.  General appearance: sedated , on  vent, moves,  Neck: IJ cath Resp: rales RLL Cardio: S1, S2 normal and systolic murmur: holosystolic 2/6, blowing at apex GI: soft, pos bs, liver down 5 cm Extremities: edema 2-3+  Lab Results: Recent Labs    05/14/17 1540 05/15/17 0400  WBC 16.9* 13.1*  HGB 8.1* 7.3*  HCT 25.6* 23.7*  PLT 450* 425*   BMET:  Recent Labs    05/14/17 0442 05/15/17 0400  NA 151* 154*  K 5.6* 5.3*  CL 120* 121*  CO2 19* 21*  GLUCOSE 156* 130*  BUN 125* 118*  CREATININE 4.77* 4.57*  CALCIUM 8.1* 8.0*   No results for input(s): PTH in the last 72 hours. Iron Studies:  Recent Labs    05/13/17 0730  IRON 19*  TIBC 133*    Studies/Results: Dg Abd 1 View  Result Date: 05/14/2017 CLINICAL DATA:  OG tube placement. EXAM: ABDOMEN - 1 VIEW COMPARISON:  05/11/2017. FINDINGS: OG tube noted with tip in the upper stomach and side hole at the gastroesophageal junction. Advancement of approximately 8 cm should be considered. IVC filter noted with tip at the L2 level. Slightly prominent air-filled loops of small and large bowel noted. Adynamic ileus could present this fashion. IMPRESSION: OG  tube noted with tip in the upper stomach and side hole at the gastroesophageal junction. Advancement of approximately 8 cm should be considered. Persistent slightly prominent air-filled loops of small and large bowel. Electronically Signed   By: Maisie Fus  Register   On: 05/14/2017 12:42   Dg Chest Port 1 View  Result Date: 05/14/2017 CLINICAL DATA:  ET tube placement. EXAM: PORTABLE CHEST 1 VIEW COMPARISON:  05/14/2017 FINDINGS: Endotracheal tube with the tip 4.2 cm above the carina. Nasogastric tube coursing below the diaphragm. Right jugular central venous catheter with the tip projecting over the SVC. Left jugular central venous catheter with the tip projecting over the SVC. Small right pleural effusion with right basilar airspace disease likely reflecting atelectasis. Mild left basilar atelectasis. No pneumothorax. Stable cardiomediastinal silhouette. No acute osseous abnormality. IMPRESSION: 1. Support lines and tubing in satisfactory position. 2. Small right pleural effusion.  Bibasilar atelectasis. Electronically Signed   By: Elige Ko   On: 05/14/2017 12:42   Dg Chest Port 1 View  Result Date: 05/14/2017 CLINICAL DATA:  Hypoxia EXAM: PORTABLE CHEST 1 VIEW COMPARISON:  May 13, 2017. FINDINGS: Endotracheal tube tip is 4.9 cm above the carina. There is a right jugular catheter with tip in superior vena cava. Left jugular catheter tip is also in the superior vena cava. Nasogastric tube tip and side port are below the diaphragm. No pneumothorax. There is bibasilar atelectatic change. Lungs elsewhere are clear. Heart size  and pulmonary vascularity are normal. No adenopathy. No bone lesions. IMPRESSION: Tube and catheter positions as described without pneumothorax. Bibasilar atelectasis. Lungs elsewhere clear. Heart size normal. Electronically Signed   By: Bretta BangWilliam  Woodruff III M.D.   On: 05/14/2017 07:51    I have reviewed the patient's current medications.  Assessment/Plan: 1 AKI Cr stable to  trending down. Vol xs, slow removal.  Caution with Kermit Balo^SNa.   Mild acidemia, on bicarb. On low dose NE 2 Shock 3 PE 4 DVT on Hep 5 Nutrition TF 6 VDRF failed extub for trach 7 Anemia downward trend 8 ^ SNa P free water    LOS: 14 days   Audryanna Zurita 05/15/2017,7:15 AM

## 2017-05-15 NOTE — Procedures (Addendum)
Bronchoscopy  for Percutaneous  Tracheostomy  Name: Joseph FloridaLarry E Geist MRN: [id number] DOB: 04-09-1950 Procedure: Bronchoscopy for Percutaneous Tracheostomy Indications: for tracheostomy In conjunction with: Dr. Molli KnockYacoub  Procedure Details Consent: Risks of procedure as well as the alternatives and risks of each were explained to the (patient/caregiver).  Consent for procedure obtained. Time Out: Verified patient identification, verified procedure, site/side was marked, verified correct patient position, special equipment/implants available, medications/allergies/relevent history reviewed, required imaging and test results available.  Performed  In preparation for procedure, patient was given 100% FiO2 and bronchoscope lubricated. Sedation: Benzodiazepines, Muscle relaxants and Etomidate  Airway entered and the following bronchi were examined: trachea and carina.   Procedures performed: Endotracheal Tube retracted in 2 cm increments. Cannulation of airway observed. Dilation observed. Placement of trachel tube  observed . No overt complications. Bronchoscope removed.    Evaluation Hemodynamic Status: BP stable throughout; O2 sats: stable throughout Patient's Current Condition: stable Specimens:  None Complications: No apparent complications Patient did tolerate procedure well.   Brett CanalesSteve Minor ACNP [facility] Pager [phone] till 3 pm If no answer page [phone] 05/15/2017, 12:02 PM  I was present and supervised the entire procedure.  Alyson ReedyWesam G. Kym Fenter, M.D. [facility]. Pager: [phone]. After hours pager: [phone].

## 2017-05-15 NOTE — Progress Notes (Signed)
ANTICOAGULATION CONSULT NOTE  Pharmacy Consult for Heparin  Indication: chest pain/ACS / new BL DVTs/possible PE  No Known Allergies  Patient Measurements: Height: 5\' 10"  (177.8 cm) Weight: 190 lb 0.6 oz (86.2 kg) IBW/kg (Calculated) : 73  Vital Signs: Temp: 98.5 F (36.9 C) (02/27 0438) Temp Source: Oral (02/27 0438) BP: 112/64 (02/27 0600) Pulse Rate: 103 (02/27 0600)  Labs: Recent Labs    05/13/17 0430  05/14/17 0439 05/14/17 0442 05/14/17 1540 05/15/17 0400  HGB 7.0*  --  6.6*  --  8.1* 7.3*  HCT 22.4*  --  21.6*  --  25.6* 23.7*  PLT 364  --  413*  --  450* 425*  HEPARINUNFRC 0.28*  --  0.48  --   --  0.54  CREATININE 5.34*   < > 4.77* 4.77*  --  4.57*   < > = values in this interval not displayed.     Assessment: [name] in the ICU after being "found down".Patient has been found to have bilateral lower extremity with concern for possible PE.  Heparin level is therapeutic this AM at 0.54 on 1600 units/hr. CBC is low but stable and platelets are WNL. No signs of bleeding have been noted.    Goal of Therapy:  Heparin level 0.3-0.7 units/ml Monitor platelets by anticoagulation protocol: Yes   Plan:  Continue  Heparin at 1600 units/hr Daily heparin level and CBC Monitor for signs/symptoms of bleeding  Sharin MonsEmily Maricela Schreur, PharmD, BCPS PGY2 Infectious Diseases Pharmacy Resident Pager: [phone]  05/15/2017 7:13 AM

## 2017-05-15 NOTE — Progress Notes (Signed)
[facility]/Trauma Progress Note      Assessment/Plan Active Problems:   Lactic acidemia   Acute respiratory failure (HCC)   Exposure to weather condition   Hypotension   Non-ST elevation (NSTEMI) myocardial infarction [facility](HCC)   Malnutrition of moderate degree   Acute cystitis with hematuria   Sepsis (HCC)   Acute deep vein thrombosis (DVT) of proximal vein of both lower extremities (HCC)   Pressure injury of skin   Cardiogenic shock (HCC)   Acute cor pulmonale (HCC)   Acute respiratory failure with hypoxemia (HCC)  PEG - PEG on Thursday, 02/28, bedside at 1100 - will need to hold TF and heparin for 8 hours prior.     LOS: 14 days    Subjective: CC: PEG   Pt intubated and sedated. Mother-in-law at bedside.   Objective: Vital signs in last 24 hours: Temp:  [98 F (36.7 C)-100.3 F (37.9 C)] 99.1 F (37.3 C) (02/27 0737) Pulse Rate:  [80-118] 100 (02/27 0800) Resp:  [18-29] 24 (02/27 0800) BP: (98-129)/(46-69) 118/58 (02/27 0800) SpO2:  [94 %-100 %] 98 % (02/27 0800) Arterial Line BP: (104-246)/(43-214) 113/52 (02/27 0800) FiO2 (%):  [30 %-96 %] 30 % (02/27 0800) Weight:  [190 lb 0.6 oz (86.2 kg)] 190 lb 0.6 oz (86.2 kg) (02/27 0439) Last BM Date: 05/14/17  Intake/Output from previous day: 02/26 0701 - 02/27 0700 In: 3161.7 [I.V.:1645.4; Blood:315; NG/GT:1101.3; IV Piggyback:100] Out: 3175 [Urine:3175] Intake/Output this shift: Total I/O In: 69.9 [I.V.:69.9] Out: 325 [Emesis/NG output:325]  PE: Gen:  Intubated and sedated, ill appearing Card:  Mild tachycardia, no M/G/R heard Pulm:  intubated Abd: Soft, not distended, +BS, no HSM   Anti-infectives: Anti-infectives (From admission, onward)   Start     Dose/Rate Route Frequency Ordered Stop   05/07/17 2200  meropenem (MERREM) 500 mg in sodium chloride 0.9 % 100 mL IVPB     500 mg 200 mL/hr over 30 Minutes Intravenous Every 24 hours 05/07/17 1306 05/19/17 2359   05/07/17 0930  meropenem  (MERREM) 1 g in sodium chloride 0.9 % 100 mL IVPB  Status:  Discontinued     1 g 200 mL/hr over 30 Minutes Intravenous Every 12 hours 05/07/17 0908 05/07/17 1306   05/06/17 1300  meropenem (MERREM) 500 mg in sodium chloride 0.9 % 100 mL IVPB  Status:  Discontinued     500 mg 200 mL/hr over 30 Minutes Intravenous Every 24 hours 05/06/17 1146 05/07/17 0908   05/04/17 2200  vancomycin (VANCOCIN) IVPB 1000 mg/200 mL premix     1,000 mg 200 mL/hr over 60 Minutes Intravenous Every 24 hours 05/04/17 0933 05/07/17 2339   05/04/17 1400  piperacillin-tazobactam (ZOSYN) IVPB 3.375 g  Status:  Discontinued     3.375 g 100 mL/hr over 30 Minutes Intravenous Every 6 hours 05/04/17 0932 05/06/17 1146   05/03/17 1300  vancomycin (VANCOCIN) IVPB 1000 mg/200 mL premix  Status:  Discontinued     1,000 mg 200 mL/hr over 60 Minutes Intravenous Every 48 hours 05/02/17 1340 05/04/17 0933   05/02/17 2000  piperacillin-tazobactam (ZOSYN) IVPB 2.25 g  Status:  Discontinued     2.25 g 100 mL/hr over 30 Minutes Intravenous Every 6 hours 05/02/17 1342 05/04/17 0932   05/02/17 1300  vancomycin (VANCOCIN) IVPB 1000 mg/200 mL premix  Status:  Discontinued     1,000 mg 200 mL/hr over 60 Minutes Intravenous Every 24 hours 05/01/17 1313 05/02/17 1340   05/01/17 2000  piperacillin-tazobactam (ZOSYN) IVPB 3.375 g  Status:  Discontinued     3.375 g 12.5 mL/hr over 240 Minutes Intravenous Every 8 hours 05/01/17 1313 05/02/17 1342   05/01/17 1200  vancomycin (VANCOCIN) 1,750 mg in sodium chloride 0.9 % 500 mL IVPB     1,750 mg 250 mL/hr over 120 Minutes Intravenous  Once 05/01/17 1121 05/01/17 1409   05/01/17 1130  piperacillin-tazobactam (ZOSYN) IVPB 3.375 g     3.375 g 100 mL/hr over 30 Minutes Intravenous  Once 05/01/17 1119 05/01/17 1217   05/01/17 1130  vancomycin (VANCOCIN) IVPB 1000 mg/200 mL premix  Status:  Discontinued     1,000 mg 200 mL/hr over 60 Minutes Intravenous  Once 05/01/17 1119 05/01/17 1121      Lab  Results:  Recent Labs    05/14/17 1540 05/15/17 0400  WBC 16.9* 13.1*  HGB 8.1* 7.3*  HCT 25.6* 23.7*  PLT 450* 425*   BMET Recent Labs    05/14/17 0442 05/15/17 0400  NA 151* 154*  K 5.6* 5.3*  CL 120* 121*  CO2 19* 21*  GLUCOSE 156* 130*  BUN 125* 118*  CREATININE 4.77* 4.57*  CALCIUM 8.1* 8.0*   PT/INR No results for input(s): LABPROT, INR in the last 72 hours. CMP     Component Value Date/Time   NA 154 (H) 05/15/2017 0400   K 5.3 (H) 05/15/2017 0400   CL 121 (H) 05/15/2017 0400   CO2 21 (L) 05/15/2017 0400   GLUCOSE 130 (H) 05/15/2017 0400   BUN 118 (H) 05/15/2017 0400   CREATININE 4.57 (H) 05/15/2017 0400   CALCIUM 8.0 (L) 05/15/2017 0400   PROT 6.1 (L) 05/10/2017 0814   ALBUMIN 1.4 (L) 05/15/2017 0400   AST 68 (H) 05/10/2017 0814   ALT 120 (H) 05/10/2017 0814   ALKPHOS 103 05/10/2017 0814   BILITOT 0.4 05/10/2017 0814   GFRNONAA 12 (L) 05/15/2017 0400   GFRAA 14 (L) 05/15/2017 0400   Lipase     Component Value Date/Time   LIPASE 22 03/25/2017 0740    Studies/Results: Dg Abd 1 View  Result Date: 05/14/2017 CLINICAL DATA:  OG tube placement. EXAM: ABDOMEN - 1 VIEW COMPARISON:  05/11/2017. FINDINGS: OG tube noted with tip in the upper stomach and side hole at the gastroesophageal junction. Advancement of approximately 8 cm should be considered. IVC filter noted with tip at the L2 level. Slightly prominent air-filled loops of small and large bowel noted. Adynamic ileus could present this fashion. IMPRESSION: OG tube noted with tip in the upper stomach and side hole at the gastroesophageal junction. Advancement of approximately 8 cm should be considered. Persistent slightly prominent air-filled loops of small and large bowel. Electronically Signed   By: Maisie Fus  Register   On: 05/14/2017 12:42   Dg Chest Port 1 View  Result Date: 05/14/2017 CLINICAL DATA:  ET tube placement. EXAM: PORTABLE CHEST 1 VIEW COMPARISON:  05/14/2017 FINDINGS: Endotracheal tube with  the tip 4.2 cm above the carina. Nasogastric tube coursing below the diaphragm. Right jugular central venous catheter with the tip projecting over the SVC. Left jugular central venous catheter with the tip projecting over the SVC. Small right pleural effusion with right basilar airspace disease likely reflecting atelectasis. Mild left basilar atelectasis. No pneumothorax. Stable cardiomediastinal silhouette. No acute osseous abnormality. IMPRESSION: 1. Support lines and tubing in satisfactory position. 2. Small right pleural effusion.  Bibasilar atelectasis. Electronically Signed   By: Elige Ko   On: 05/14/2017 12:42   Dg Chest Port 1 View  Result  Date: 05/14/2017 CLINICAL DATA:  Hypoxia EXAM: PORTABLE CHEST 1 VIEW COMPARISON:  May 13, 2017. FINDINGS: Endotracheal tube tip is 4.9 cm above the carina. There is a right jugular catheter with tip in superior vena cava. Left jugular catheter tip is also in the superior vena cava. Nasogastric tube tip and side port are below the diaphragm. No pneumothorax. There is bibasilar atelectatic change. Lungs elsewhere are clear. Heart size and pulmonary vascularity are normal. No adenopathy. No bone lesions. IMPRESSION: Tube and catheter positions as described without pneumothorax. Bibasilar atelectasis. Lungs elsewhere clear. Heart size normal. Electronically Signed   By: Bretta Bang III M.D.   On: 05/14/2017 07:51      Jerre Simon , [facility] 05/15/2017, 9:03 AM Pager: [phone] Consults: [phone] Mon-Fri 7:00 am-4:30 pm Sat-Sun 7:00 am-11:30 am

## 2017-05-15 NOTE — Care Management Note (Addendum)
Case Management Note  Patient Details  Name: Joseph FloridaLarry E Dobias MRN: [id number] Date of Birth: 1950/05/16  Subjective/Objective:    Pt admitted with Hypothermia shock, and bilateral DVTs                Action/Plan:  PTA from home and was found down.  Pt remains on ventilator via ETT tube.  Pt originally on CRRT - now discontinued and daily IHD assessments.  CM will continue to follow for discharge needs   Expected Discharge Date:                  Expected Discharge Plan:     In-House Referral:     Discharge planning Services  CM Consult  Post Acute Care Choice:    Choice offered to:     DME Arranged:    DME Agency:     HH Arranged:    HH Agency:     Status of Service:     If discussed at [employer] Meetings, dates discussed:    Additional Comments: CM informed by sister that pt is a veteran and may have VA benefits.  CM contacted VA and was informed that pt is non service connected and not established with VA however [place].  CM informed that pt will not have LTACH benefit however pt may qualify for short term placement via VA (per VA this is a right for every veteran regardless of being service connected).  VA requested if VA placement is sought - CSW to contact [place] SW Adaku Okibedi [phone]  Pt re intubated and now on vent via trach.  Pts sister at bedside.  CM informed by [facility] that pt only has Medicare Part A and therefore is not a candidate for LTACH.  CSW following [name], Ezariah Nace S, RN 05/15/2017, 3:28 PM

## 2017-05-15 NOTE — Progress Notes (Addendum)
PULMONARY / CRITICAL CARE MEDICINE   Name: Joseph Downs MRN: [id number] DOB: [birth year]    ADMISSION DATE:  05/01/2017 CONSULTATION DATE:  05/01/17  REFERRING MD:  Dr. Deretha Emory  CHIEF COMPLAINT:  Found down  HISTORY OF PRESENT ILLNESS:   67 year old homeless man found down/13 with hypothermia, shock lactic acidosis. Found to have bilateral DVTs and evidence of RV strain on echo He had Acinetobacter in blood and staph in urine with elevated PSA  STUDIES:  ECHO 2/14 - EF 65-70%, G1DD, RV strain Echo 2/21 - EF 55-60%, RV improved  CULTURES: MRSA PCR negative Ucx + staph lugdenensis Blood cx 2/13 >> Diphtheroids + Acinetobacter lwoffii Blood cx 2/19 >>NGTD Blood cx 2/20 >> NGTD  ANTIBIOTICS: Zosyn 2/13> 2/18 Vanc 2/13 >>2/19 Meropenem 2/18 >>  SIGNIFICANT EVENTS: 2/13 admitted, intubated 2/22 IVC filter placed 2/26 1u PRBC  LINES/TUBES: ETT 2/13 >> 2/26, reinserted after Foley 2/13 >> OGT 2/13 Right IJ HD catheter 2/15 >> Left IJ CVC 2/15 >> L radial aline 2/15 >>  SUBJECTIVE:  Failed extubation yesterday.   VITAL SIGNS: BP 112/64   Pulse (!) 103   Temp 98.5 F (36.9 C) (Oral)   Resp (!) 22   Ht 5\' 10"  (1.778 m)   Wt 190 lb 0.6 oz (86.2 kg)   SpO2 99%   BMI 27.27 kg/m   HEMODYNAMICS:    VENTILATOR SETTINGS: Vent Mode: PRVC FiO2 (%):  [30 %-96 %] 30 % Set Rate:  [18 bmp] 18 bmp Vt Set:  [580 mL] 580 mL PEEP:  [5 cmH20] 5 cmH20 Pressure Support:  [5 cmH20-12 cmH20] 12 cmH20 Plateau Pressure:  [14 cmH20-17 cmH20] 17 cmH20  INTAKE / OUTPUT: I/O last 3 completed shifts: In: 4837 [I.V.:2295.7; Blood:315; NG/GT:2126.3; IV Piggyback:100] Out: 4805 [Urine:4805]  PHYSICAL EXAMINATION: General: Ill-appearing man, lying in bed, sedated, intubated Neuro: moves all extremities HEENT: oral ETT , no oral lesions Cardiovascular: RRR, no MRG Lungs: CTAB Abdomen: Soft, benign, positive bowel sounds Musculoskeletal: Bilateral lower extremity pitting  edema, feet wrapped bilaterally Skin: skin sloughing feet bilaterally , dry gangrene toes  LABS:  BMET Recent Labs  Lab 05/14/17 0439 05/14/17 0442 05/15/17 0400  NA 151* 151* 154*  K 5.6* 5.6* 5.3*  CL 120* 120* 121*  CO2 19* 19* 21*  BUN 125* 125* 118*  CREATININE 4.77* 4.77* 4.57*  GLUCOSE 156* 156* 130*    Electrolytes Recent Labs  Lab 05/09/17 0608  05/13/17 0430  05/14/17 0439 05/14/17 0442 05/15/17 0400  CALCIUM 7.5*   < > 7.8*   < > 8.1* 8.1* 8.0*  MG 2.4  --  2.6*  --  2.3  --   --   PHOS 7.0*  --  7.1*   < > 6.8* 6.8* 7.1*   < > = values in this interval not displayed.    CBC Recent Labs  Lab 05/14/17 0439 05/14/17 1540 05/15/17 0400  WBC 14.5* 16.9* 13.1*  HGB 6.6* 8.1* 7.3*  HCT 21.6* 25.6* 23.7*  PLT 413* 450* 425*    Coag's Recent Labs  Lab 05/10/17 0650  INR 1.21    Sepsis Markers Recent Labs  Lab 05/10/17 1112 05/11/17 0345 05/12/17 0301  PROCALCITON 4.78 4.23 3.65    ABG Recent Labs  Lab 05/14/17 0348  PHART 7.335*  PCO2ART 36.6  PO2ART 141*    Liver Enzymes Recent Labs  Lab 05/10/17 0814 05/13/17 1532 05/14/17 0442 05/15/17 0400  AST 68*  --   --   --  ALT 120*  --   --   --   ALKPHOS 103  --   --   --   BILITOT 0.4  --   --   --   ALBUMIN 1.4* 1.3* 1.3* 1.4*    Cardiac Enzymes No results for input(s): TROPONINI, PROBNP in the last 168 hours.  Glucose Recent Labs  Lab 05/14/17 0812 05/14/17 1141 05/14/17 1545 05/14/17 2023 05/15/17 0013 05/15/17 0430  GLUCAP 157* 130* 112* 121* 124* 119*    Imaging Dg Abd 1 View  Result Date: 05/14/2017 CLINICAL DATA:  OG tube placement. EXAM: ABDOMEN - 1 VIEW COMPARISON:  05/11/2017. FINDINGS: OG tube noted with tip in the upper stomach and side hole at the gastroesophageal junction. Advancement of approximately 8 cm should be considered. IVC filter noted with tip at the L2 level. Slightly prominent air-filled loops of small and large bowel noted. Adynamic ileus  could present this fashion. IMPRESSION: OG tube noted with tip in the upper stomach and side hole at the gastroesophageal junction. Advancement of approximately 8 cm should be considered. Persistent slightly prominent air-filled loops of small and large bowel. Electronically Signed   By: Maisie Fus  Register   On: 05/14/2017 12:42   Dg Chest Port 1 View  Result Date: 05/14/2017 CLINICAL DATA:  ET tube placement. EXAM: PORTABLE CHEST 1 VIEW COMPARISON:  05/14/2017 FINDINGS: Endotracheal tube with the tip 4.2 cm above the carina. Nasogastric tube coursing below the diaphragm. Right jugular central venous catheter with the tip projecting over the SVC. Left jugular central venous catheter with the tip projecting over the SVC. Small right pleural effusion with right basilar airspace disease likely reflecting atelectasis. Mild left basilar atelectasis. No pneumothorax. Stable cardiomediastinal silhouette. No acute osseous abnormality. IMPRESSION: 1. Support lines and tubing in satisfactory position. 2. Small right pleural effusion.  Bibasilar atelectasis. Electronically Signed   By: Elige Ko   On: 05/14/2017 12:42   DISCUSSION: Bilateral lower extremity DVT, right heart strain by echocardiogram very suggestive of massive PE.  He had polymicrobial bacteremia, no evidence of valvular disease by echo, not yet speciated.  Also with Staph lugdunensis UTI (often associated with prostate cancer). PSA elevated. Acinetobacter in blood could be contaminant or related to cellulitis AKI -off  CVVHD.   Current issues are vent dependence & worsening AKI  ASSESSMENT / PLAN:  PULMONARY A: Acute respiratory failure on ventilator Likely pulmonary embolism with associated right heart strain P:   Failed extubation 2/26 Trach today 2/27 Continue heparin infusion for PE  CARDIOVASCULAR A:  Stress related non-ST elevation MI, suspect right heart strain Acute Cor pulmonale P:  Cards has signed off for now He will  require an ischemic evaluation once stable Monitor on tele  RENAL A:   Acute non-oliguric renal failure-UO remains good Hyperkalemia- resolved Lactic acidosis- resolved Rhabdomyolysis-improving Hypernatremia P:   Nephrology following, appreciate recommendations Follow BMP, urine output Free water per tube  GASTROINTESTINAL A:   NPO Elevated LFTs, suspect shock liver - resolved P:   Continue PPI Continue tube feeding PEG planned bedside 2/28  HEMATOLOGIC / ONC A:   Bilateral DVTs S/p IVC filter Leukocytosis Anemia - worsening Elevated PSA, Staph lugdunensis both suggestive of possible prostatic CA Thrombocytopenia- resolved, now elevated plts P:  Continue heparin drip Follow CBC Transfusion threshold < 7  He will need further evaluation for possible prostate cancer once he stabilizes from his critical illness  INFECTIOUS A:   RLL infiltrate on CXR UTI-  >100,000 colonies staph lugdenenis  s/p 7 days of vanc Acinetobacter lwoffii bacteremia. Persistent fever  P:   continue meropenem x 2 weeks- on Day # 10 Wound care for his bilateral foot wounds Trend WBC, fevers  ENDOCRINE A:   hyperglycemia P:   Follow CBG Sliding scale insulin per protocol  WOUND CARE A: Bilateral feet with evidence for skin sloughing P: Continue current dressing changes Vascular surgery following, defer arteriogram for now due to poor renal function  NEUROLOGIC A:   Toxic metabolic encephalopathy Sedation for ventilator tolerance P:   RASS goal: -1 Ct  Fentanyl &  Precedex ct klonopin 1 bid  Continue seroquel 100 mg BID for agitation  Versed prn   FAMILY  - Updates: Mother in [place], sister in [place], updated family at bedside 2/27 - Inter-disciplinary family meet or Palliative Care meeting due by: done  Dolores PattyAngela Riccio, DO PGY-2, [facility] 05/15/2017 7:27 AM  Attending Note:  67 year old male with necrotic leg due to frost bite that is now septic and  required neo overnight for BP support.  Patient is unresponsive on exam with coarse BS diffusely.  I reviewed CXR myself, ETT is in good position.  Discussed with PCCM-NP.  Will get neo off today.  Increase free water to 400 ml q4 via NGT.  Continue abx.  Proceed with tracheostomy today.  Hold further weaning efforts for today.  Place NGT.  PCCM will follow.  The patient is critically ill with multiple organ systems failure and requires high complexity decision making for assessment and support, frequent evaluation and titration of therapies, application of advanced monitoring technologies and extensive interpretation of multiple databases.   Critical Care Time devoted to patient care services described in this note is  35  Minutes. This time reflects time of care of this signee Dr Koren BoundWesam Yacoub. This critical care time does not reflect procedure time, or teaching time or supervisory time of PA/NP/Med student/Med Resident etc but could involve care discussion time.  Alyson ReedyWesam G. Yacoub, M.D. [facility]. Pager: [phone]. After hours pager: [phone].

## 2017-05-16 ENCOUNTER — Encounter (HOSPITAL_COMMUNITY)
Admission: EM | Disposition: A | Discharge status: Skilled Nursing Facility | Payer: Self-pay | Source: Home / Self Care | Attending: Emergency Medicine

## 2017-05-16 ENCOUNTER — Inpatient Hospital Stay (HOSPITAL_COMMUNITY): Payer: Medicare Other

## 2017-05-16 ENCOUNTER — Encounter (HOSPITAL_COMMUNITY): Payer: Self-pay | Admitting: General Surgery

## 2017-05-16 DIAGNOSIS — I96 Gangrene, not elsewhere classified: Secondary | ICD-10-CM

## 2017-05-16 HISTORY — PX: ESOPHAGOGASTRODUODENOSCOPY: SHX5428

## 2017-05-16 HISTORY — PX: PEG PLACEMENT: SHX5437

## 2017-05-16 LAB — RENAL FUNCTION PANEL
Albumin: 1.5 g/dL — ABNORMAL LOW (ref 3.5–5.0)
Anion gap: 15 (ref 5–15)
BUN: 112 mg/dL — ABNORMAL HIGH (ref 6–20)
CHLORIDE: 126 mmol/L — AB (ref 101–111)
CO2: 20 mmol/L — AB (ref 22–32)
Calcium: 8.1 mg/dL — ABNORMAL LOW (ref 8.9–10.3)
Creatinine, Ser: 4.84 mg/dL — ABNORMAL HIGH (ref 0.61–1.24)
GFR, EST AFRICAN AMERICAN: 13 mL/min — AB (ref >60)
GFR, EST NON AFRICAN AMERICAN: 11 mL/min — AB (ref >60)
Glucose, Bld: 138 mg/dL — ABNORMAL HIGH (ref 65–99)
POTASSIUM: 5.1 mmol/L (ref 3.5–5.1)
Phosphorus: 6.8 mg/dL — ABNORMAL HIGH (ref 2.5–4.6)
Sodium: 161 mmol/L (ref 135–145)

## 2017-05-16 LAB — HEPARIN LEVEL (UNFRACTIONATED)
HEPARIN UNFRACTIONATED: 0.29 [IU]/mL — AB (ref 0.30–0.70)
Heparin Unfractionated: <0.1 IU/mL — ABNORMAL LOW (ref 0.30–0.70)

## 2017-05-16 LAB — GLUCOSE, CAPILLARY
GLUCOSE-CAPILLARY: 129 mg/dL — AB (ref 65–99)
Glucose-Capillary: 127 mg/dL — ABNORMAL HIGH (ref 65–99)
Glucose-Capillary: 132 mg/dL — ABNORMAL HIGH (ref 65–99)
Glucose-Capillary: 138 mg/dL — ABNORMAL HIGH (ref 65–99)
Glucose-Capillary: 139 mg/dL — ABNORMAL HIGH (ref 65–99)

## 2017-05-16 LAB — CBC
HEMATOCRIT: 25.6 % — AB (ref 39.0–52.0)
Hemoglobin: 7.6 g/dL — ABNORMAL LOW (ref 13.0–17.0)
MCH: 28.1 pg (ref 26.0–34.0)
MCHC: 29.7 g/dL — ABNORMAL LOW (ref 30.0–36.0)
MCV: 94.8 fL (ref 78.0–100.0)
Platelets: 488 10*3/uL — ABNORMAL HIGH (ref 150–400)
RBC: 2.7 MIL/uL — AB (ref 4.22–5.81)
RDW: 15.7 % — ABNORMAL HIGH (ref 11.5–15.5)
WBC: 15.5 10*3/uL — AB (ref 4.0–10.5)

## 2017-05-16 SURGERY — EGD (ESOPHAGOGASTRODUODENOSCOPY)
Anesthesia: Moderate Sedation

## 2017-05-16 MED ORDER — HEPARIN (PORCINE) IN NACL 100-0.45 UNIT/ML-% IJ SOLN
1600.0000 [IU]/h | INTRAMUSCULAR | Status: DC
  Administered 2017-05-16 – 2017-05-17 (×2): 1600 [IU]/h via INTRAVENOUS
  Filled 2017-05-16 (×5): qty 250

## 2017-05-16 MED ORDER — SODIUM BICARBONATE 650 MG PO TABS
1300.0000 mg | ORAL_TABLET | Freq: Four times a day (QID) | ORAL | Status: DC
  Administered 2017-05-16 – 2017-05-17 (×4): 1300 mg
  Filled 2017-05-16 (×5): qty 2

## 2017-05-16 MED ORDER — DEXTROSE-NACL 5-0.2 % IV SOLN
INTRAVENOUS | Status: DC
  Administered 2017-05-16 (×2): via INTRAVENOUS
  Administered 2017-05-16: 500 mL via INTRAVENOUS
  Administered 2017-05-17: 125 mL/h via INTRAVENOUS
  Administered 2017-05-17: 11:00:00 via INTRAVENOUS
  Administered 2017-05-17 – 2017-05-18 (×4): 125 mL/h via INTRAVENOUS

## 2017-05-16 MED ORDER — MIDAZOLAM HCL 2 MG/2ML IJ SOLN
4.0000 mg | Freq: Once | INTRAMUSCULAR | Status: AC
  Administered 2017-05-16: 4 mg via INTRAVENOUS

## 2017-05-16 MED ORDER — FENTANYL CITRATE (PF) 100 MCG/2ML IJ SOLN
25.0000 ug | INTRAMUSCULAR | Status: DC | PRN
  Administered 2017-05-16 – 2017-05-24 (×4): 50 ug via INTRAVENOUS
  Administered 2017-05-24 (×3): 25 ug via INTRAVENOUS
  Administered 2017-05-24 (×2): 50 ug via INTRAVENOUS
  Administered 2017-05-24: 25 ug via INTRAVENOUS
  Administered 2017-05-24: 50 ug via INTRAVENOUS
  Administered 2017-05-28: 25 ug via INTRAVENOUS
  Filled 2017-05-16 (×5): qty 2

## 2017-05-16 MED ORDER — VECURONIUM BROMIDE 10 MG IV SOLR
10.0000 mg | Freq: Once | INTRAVENOUS | Status: AC
  Administered 2017-05-16: 10 mg via INTRAVENOUS
  Filled 2017-05-16 (×2): qty 10

## 2017-05-16 MED ORDER — QUETIAPINE FUMARATE 100 MG PO TABS
100.0000 mg | ORAL_TABLET | Freq: Two times a day (BID) | ORAL | Status: DC
  Administered 2017-05-16 – 2017-05-17 (×3): 100 mg
  Filled 2017-05-16 (×4): qty 1

## 2017-05-16 MED ORDER — VITAL AF 1.2 CAL PO LIQD
1000.0000 mL | ORAL | Status: DC
  Administered 2017-05-16 – 2017-05-17 (×2): 1000 mL

## 2017-05-16 NOTE — Op Note (Addendum)
[facility] Patient Name: Joseph ArLarry Downs Procedure Date : 05/16/2017 MRN: [id number] Attending MD: Violeta GelinasBurke Fredric Slabach , MD Date of Birth: Jul 18, 1950 CSN: [id number] Age: [age] Admit Type:  THIS EXAM WAS SENT IN ERROR  Unsure what happened to my original note as above. See the following:  Op Note  Preoperative diagnosis: Need for enteral access Postoperative diagnosis: Same Procedure: Percutaneous endoscopic gastrostomy tube placement Surgeon: Violeta GelinasBurke Rhona Fusilier, MD Assistant: Wells GuilesKelly Rayburn, PA-C Procedure in detail: Informed consent was obtained.  Timeout procedure was performed.  The patient was monitored throughout in the medical intensive care unit.  He received sedation, pain medication, and muscle relaxation.  The EGD scope was inserted via his mouth after placement of a bite block.  The esophagus appeared normal without any gross lesions.  The stomach was entered and insufflated.  The scope was passed to the first portion of the duodenum and no significant abnormalities were seen.  The scope was withdrawn into the stomach and there were no gross lesions there.  It was initially difficult to find a good poke site but we did locate one.  The area was anesthetized with local and incision was made.  The needle was passed into the stomach under direct vision.  Guidewire was placed and grasped with the snare.  It was brought out through the mouth and the PEG tube was attached.  The PEG tube was brought through the abdominal wall in standard fashion.  The scope was reinserted down into the stomach and position was confirmed.  A picture was taken.  A flange was placed on the PEG tube in the stomach was evacuated.  Antibiotic ointment was placed around the PEG site and a small dressing was applied.  He tolerated the procedure well without apparent complication and remained in the intensive care unit.  We will order an abdominal binder.  Okay to use meds via the PEG immediately and tube feeds in 4  hours.  Violeta GelinasBurke Aneisa Karren, MD, MPH, FACS Trauma: [phone] General Surgery: [phone]

## 2017-05-16 NOTE — Progress Notes (Addendum)
CSW spoke with [facility] Child psychotherapistocial Worker and was informed that at this time pt  has [place] and [place] not. CSW was also informed that any [place] can qualify for placement (short term) at a SNF. Social Worker from [place] expressed being unsure as to whether or not they have Vent/snf facilities for pt but expressed that she would look into this and call CSW back. Social Worker from [place] suggested that in order for pt to get long term care form the [place] pt would need to be 70 percent service connected and at this time pt is not. CSW will continue to follow for vent/snf needs as well as other potential placement options.     Joseph Downs, MSW, LCSW-A Emergency Department Clinical Social Worker [phone]

## 2017-05-16 NOTE — Progress Notes (Signed)
CSW reached out to pt's sister Eber JonesCarolyn for assessment. She expressed that she was unable to talk at this time and would call CSW back.     Claude MangesKierra S. Zayyan Mullen, MSW, LCSW-A Emergency Department Clinical Social Worker [phone]

## 2017-05-16 NOTE — Progress Notes (Addendum)
PULMONARY / CRITICAL CARE MEDICINE   Name: Joseph Downs MRN: [id number] DOB: 1950-05-09    ADMISSION DATE:  05/01/2017 CONSULTATION DATE:  05/01/17  REFERRING MD:  Dr. Deretha Emory  CHIEF COMPLAINT:  Found down  HISTORY OF PRESENT ILLNESS:   67 year old homeless man found down/13 with hypothermia, shock lactic acidosis. Found to have bilateral DVTs and evidence of RV strain on echo He had Acinetobacter in blood and staph in urine with elevated PSA Unable to be weaned from vent, trach placed 2/27, plans to place PEG 2/28  STUDIES:  ECHO 2/14 - EF 65-70%, G1DD, RV strain Echo 2/21 - EF 55-60%, RV improved  CULTURES: MRSA PCR negative Ucx + staph lugdenensis Blood cx 2/13 >> Diphtheroids + Acinetobacter lwoffii Blood cx 2/19 >>no growth x 5 days Blood cx 2/20 >> no growth x 5 days  ANTIBIOTICS: Zosyn 2/13> 2/18 Vanc 2/13 >>2/19 Meropenem 2/18 >>  SIGNIFICANT EVENTS: 2/13 admitted, intubated 2/22 IVC filter placed 2/25 1u PRBC 2/27 Trach  LINES/TUBES: ETT 2/13 >> 2/27 Trach 2/27 Foley 2/13 OGT 2/13 Right IJ HD catheter 2/15 >> Left IJ CVC 2/15 >>  SUBJECTIVE:  Ventilated through trach, no acute events overnight, continues to be febrile  VITAL SIGNS: BP 120/72   Pulse (!) 111   Temp 99.1 F (37.3 C) (Oral)   Resp (!) 23   Ht 5\' 10"  (1.778 m)   Wt 190 lb 0.6 oz (86.2 kg)   SpO2 98%   BMI 27.27 kg/m   HEMODYNAMICS:    VENTILATOR SETTINGS: Vent Mode: PRVC FiO2 (%):  [30 %-100 %] 30 % Set Rate:  [18 bmp-22 bmp] 18 bmp Vt Set:  [580 mL] 580 mL PEEP:  [5 cmH20] 5 cmH20 Plateau Pressure:  [17 cmH20-25 cmH20] 20 cmH20  INTAKE / OUTPUT: I/O last 3 completed shifts: In: 2424 [I.V.:1780.3; NG/GT:543.8; IV Piggyback:100] Out: 4925 [Urine:4600; Emesis/NG output:325]  PHYSICAL EXAMINATION: General: Ill-appearing man, lying in bed, sedated Neuro: moves all extremities, follows some commands, does not open eyes HEENT: dry lips and dry MM. edentulous   Cardiovascular: RRR, no MRG Lungs: CTAB Abdomen: Soft, benign, positive bowel sounds Musculoskeletal: SCDs in place, trace edema Skin: skin sloughing feet bilaterally , dry gangrene toes  LABS:  BMET Recent Labs  Lab 05/14/17 0442 05/15/17 0400 05/16/17 0440  NA 151* 154* 161*  K 5.6* 5.3* 5.1  CL 120* 121* 126*  CO2 19* 21* 20*  BUN 125* 118* 112*  CREATININE 4.77* 4.57* 4.84*  GLUCOSE 156* 130* 138*    Electrolytes Recent Labs  Lab 05/13/17 0430  05/14/17 0439 05/14/17 0442 05/15/17 0400 05/16/17 0440  CALCIUM 7.8*   < > 8.1* 8.1* 8.0* 8.1*  MG 2.6*  --  2.3  --   --   --   PHOS 7.1*   < > 6.8* 6.8* 7.1* 6.8*   < > = values in this interval not displayed.    CBC Recent Labs  Lab 05/14/17 1540 05/15/17 0400 05/16/17 0440  WBC 16.9* 13.1* 15.5*  HGB 8.1* 7.3* 7.6*  HCT 25.6* 23.7* 25.6*  PLT 450* 425* 488*    Coag's Recent Labs  Lab 05/10/17 0650  INR 1.21    Sepsis Markers Recent Labs  Lab 05/10/17 1112 05/11/17 0345 05/12/17 0301  PROCALCITON 4.78 4.23 3.65    ABG Recent Labs  Lab 05/14/17 0348  PHART 7.335*  PCO2ART 36.6  PO2ART 141*    Liver Enzymes Recent Labs  Lab 05/10/17 0814  05/14/17 0442 05/15/17 0400  05/16/17 0440  AST 68*  --   --   --   --   ALT 120*  --   --   --   --   ALKPHOS 103  --   --   --   --   BILITOT 0.4  --   --   --   --   ALBUMIN 1.4*   < > 1.3* 1.4* 1.5*   < > = values in this interval not displayed.    Cardiac Enzymes No results for input(s): TROPONINI, PROBNP in the last 168 hours.  Glucose Recent Labs  Lab 05/15/17 0734 05/15/17 1203 05/15/17 1555 05/15/17 2004 05/15/17 2329 05/16/17 0409  GLUCAP 112* 134* 126* 107* 128* 127*    Imaging Dg Abd 1 View  Result Date: 05/15/2017 CLINICAL DATA:  67 year old male status post NG tube placement. EXAM: ABDOMEN - 1 VIEW COMPARISON:  Abdominal radiograph dated 05/14/2017 FINDINGS: An enteric tube extends into the distal stomach and folds  back with side-port in the body of the stomach and tip in the fundus. No bowel dilatation is noted upper abdomen. IVC filter is seen. The osseous structures and soft tissues appear unremarkable. Bibasilar atelectatic changes noted. IMPRESSION: Enteric tube with tip in the stomach. Electronically Signed   By: Elgie Collard M.D.   On: 05/15/2017 23:57   Dg Chest Port 1 View  Result Date: 05/16/2017 CLINICAL DATA:  67 year old male found down with hypothermia, lactic acidosis, bilateral DVTs. Tracheostomy placed yesterday. EXAM: PORTABLE CHEST 1 VIEW COMPARISON:  05/15/2017 and earlier. FINDINGS: Portable AP semi upright view at 0444 hours. Tracheostomy tube remains in place, the internal portion is mildly oblique today compared to yesterday. Stable bilateral IJ central lines. Enteric tube loops in the left upper quadrant. Stable lung volumes. Continued veiling opacity at the right lung base. No pneumothorax, pulmonary edema or other confluent opacity. Stable ventilation since yesterday. Visible bowel gas pattern within normal limits. IMPRESSION: 1. Tracheostomy tube positioning is more oblique compared to yesterday. Recommend bedside evaluation. 2.  Otherwise stable lines and tubes. 3. Stable ventilation with continued mild veiling opacity at the right lung base which could reflect atelectasis, pneumonia, or small effusion. Electronically Signed   By: Odessa Fleming M.D.   On: 05/16/2017 06:51   Dg Chest Port 1 View  Result Date: 05/15/2017 CLINICAL DATA:  Tracheostomy EXAM: PORTABLE CHEST 1 VIEW COMPARISON:  Portable exam 1252 hours compared to 05/14/2017 FINDINGS: Interval placement of tracheostomy tube with tip projecting over tracheal air column. BILATERAL jugular lines with tips projecting over SVC. Normal heart size mediastinal contours. Bibasilar atelectasis RIGHT greater than LEFT with question RIGHT pleural effusion. No pneumothorax. IMPRESSION: New tracheostomy tube without pneumothorax. Bibasilar  atelectasis greater on RIGHT with suspect small RIGHT pleural effusion. Electronically Signed   By: Ulyses Southward M.D.   On: 05/15/2017 13:10   DISCUSSION: Bilateral lower extremity DVT, right heart strain by echocardiogram very suggestive of massive PE.  He had polymicrobial bacteremia, no evidence of valvular disease by echo, not yet speciated.  Also with Staph lugdunensis UTI (often associated with prostate cancer). PSA elevated. Acinetobacter in blood could be contaminant or related to cellulitis AKI -off  CVVHD.   Current issues are vent dependence & worsening AKI. Continues to be febrile. Trached on 2/27. PEG planned for 2/28.  ASSESSMENT / PLAN:  PULMONARY A: Acute respiratory failure on ventilator- trach 2/27 Likely pulmonary embolism with associated right heart strain P:   Wean trach vent support as able  Trach care Continue heparin infusion for PE  CARDIOVASCULAR A:  Stress related non-ST elevation MI, suspect right heart strain Acute Cor pulmonale P:  Cards has signed off for now He will require an ischemic evaluation once stable  RENAL A:   Acute non-oliguric renal failure-UO remains good, Cr worsening Hypernatremia - worsened by sodium bicarb? Hyperkalemia- resolved Lactic acidosis- resolved Rhabdomyolysis-improving P:   Nephrology following, appreciate recommendations Follow BMP, urine output Hold further diuresis for now  GASTROINTESTINAL A:   NPO Elevated LFTs, suspect shock liver - resolved P:   Continue PPI Continue tube feeding  HEMATOLOGIC / ONC A:   Bilateral DVTs S/p IVC filter Leukocytosis - worsening Anemia - stable Elevated PSA, Staph lugdunensis both suggestive of possible prostatic CA Thrombocytopenia- resolved P:  Heparin drip- held for bedside peg today Follow CBC Transfusion threshold < 7 He will need further evaluation for possible prostate cancer once he stabilizes from his critical illness  INFECTIOUS A:   RLL infiltrate on  CXR UTI-  >100,000 colonies staph lugdenenis s/p 7 days of vanc Acinetobacter lwoffii bacteremia. Persistent fever  Leukocytosis P:   continue meropenem x 2 weeks- on Day # 10 Wound care for his bilateral foot wounds Trend WBC, fevers  ENDOCRINE A:   hyperglycemia P:   Follow CBG Sliding scale insulin per protocol  WOUND CARE A: Bilateral feet with evidence for skin sloughing P: Continue current dressing changes Vascular surgery following, defer arteriogram for now due to poor renal function  NEUROLOGIC A:   Toxic metabolic encephalopathy Sedation for ventilator tolerance P:   RASS goal: -1 D/C  Fentanyl &  propofol Continue seroquel and klonopin for agitation D/C Versed PRN Fentanyl PRN  FAMILY  - Updates: Mother in [place], sister in [place], family updated daily via phone - Inter-disciplinary [place] meet or [place] by: done  Dolores PattyAngela Riccio, DO PGY-2, [facility] 05/16/2017 8:04 AM  Attending Note:  67 year old male with necrotic feet bilaterally with associated ARDS and respiratory failure that is now trached.   On exam, completely unresponsive.  I reviewed CXR myself, trach is in a good position.  Getting a PEG today.  Needs placement for long term rehab.  Did not tolerate weaning this AM.  Nephrology following.  Continue hypotonic saline.  BMET in AM.  Replace electrolytes.  Continue merrem for total of 14 days for acinetobacter in blood and f/u on susceptibility.  Stop date in place.  Regarding feet, continue wound care, no surgical interventions for now.  The patient is critically ill with multiple organ systems failure and requires high complexity decision making for assessment and support, frequent evaluation and titration of therapies, application of advanced monitoring technologies and extensive interpretation of multiple databases.   Critical Care Time devoted to patient care services described in this note is  35  Minutes.  This time reflects time of care of this signee Dr Koren BoundWesam Yacoub. This critical care time does not reflect procedure time, or teaching time or supervisory time of PA/NP/Med student/Med Resident etc but could involve care discussion time.  Alyson ReedyWesam G. Yacoub, M.D. [facility]/Critical [facility]. Pager: [phone]. After hours pager: [phone].

## 2017-05-16 NOTE — Progress Notes (Signed)
CS[facility] made aware that [facility]. [facility] reached out to [facility] social worker, and left VM asking that she call CSW back for further discussion of pt's needs. CSW remains available for support at this time.     Claude MangesKierra S. Reymond Maynez, MSW, LCSW-A Emergency Department Clinical Social Worker [phone]

## 2017-05-16 NOTE — Progress Notes (Signed)
Patient ID: Joseph Downs, male   DOB: January 22, 1951, 67 y.o.   MRN: [id number] For PEG today at bedside. Heparin held at 0300. Consent obtained by team yesterday.  On vent Abd soft VSS  [name], MD, MPH, FACS Trauma: [phone] General Surgery: [phone]

## 2017-05-16 NOTE — Clinical Social Work Note (Signed)
Clinical Social Work Assessment  Patient Details  Name: Joseph FloridaLarry E Steinberger MRN: [id number] Date of Birth: 21-Oct-1950  Date of referral:  05/16/17               Reason for consult:  Facility Placement                Permission sought to share information with:  Family Supports Permission granted to share information::  Yes, Verbal Permission Granted  Name::     Hunt OrisCarolyn McEachern   Agency::  family  Relationship::  sister  Contact Information:  Hunt OrisCarolyn McEachern [phone]  Housing/Transportation Living arrangements for the past 2 months:  Single Family Home(back and forth with ex wife. ) Source of Information:  Siblings(Carolyn ) Patient Interpreter Needed:  None Criminal Activity/Legal Involvement Pertinent to Current Situation/Hospitalization:  No - Comment as needed Significant Relationships:  Other(Comment)(ex wife and sister. ) Lives with:  Self, Significant Other Do you feel safe going back to the place where you live?  Yes Need for family participation in patient care:  Yes (Comment)  Care giving concerns:  CSW spoke with pt's sister Eber JonesCarolyn for this assessment. CSW was informed that at this time concerns include care for pt after discharge as well as what pt's needs may be at the time of discharge.   Social Worker assessment / plan:  CSW spoke with pt;s sister via phone. CSW was informed that pt has been living at home alone and also with ex wife at times. Per sister report, pt sometimes stays with pt's ex wife and she will help make sure that pt has medications as well as food. Eber JonesCarolyn expressed that she is unable to be here everyday with pt as she and other family live in [place]. Eber JonesCarolyn also expressed that pt's ex wife is not allowed to make nay medical decisions but she is a great support for pt when they are not able to be around. Sister expressed understanding that pt may need a higher level of placement once discharge from the hospital.   Employment status:  Other  (Comment)(unknown. ) Insurance information:  Medicare(only part A. ) PT Recommendations:  Not assessed at this time Information / Referral to community resources:     Patient/Family's Response to care:  Pt's sister appeared to be understanding and agreeable to plan of care at this time.   Patient/Family's Understanding of and Emotional Response to Diagnosis, Current Treatment, and Prognosis:  No further questions or concerns have been presented to CSW at this time.   Emotional Assessment Appearance:  Appears stated age Attitude/Demeanor/Rapport:  Unable to Assess Affect (typically observed):  Unable to Assess Orientation:  (pt is intubated at this time. ) Alcohol / Substance use:  Not Applicable Psych involvement (Current and /or in the community):  No (Comment)  Discharge Needs  Concerns to be addressed:  Care Coordination, Discharge Planning Concerns Readmission within the last 30 days:  No Current discharge risk:  Dependent with Mobility, Other(trach and needing vent. ) Barriers to Discharge:  Continued Medical Work up   Sempra EnergyKierra S Kweku Stankey, LCSWA 05/16/2017, 10:51 AM

## 2017-05-16 NOTE — Progress Notes (Signed)
Nutrition Consult / Follow-up  DOCUMENTATION CODES:   Non-severe (moderate) malnutrition in context of social or environmental circumstances  INTERVENTION:    Resume Vital AF 1.2 at 75 ml/h at 15:45 to provide 2160 kcal, 135 gm protein, 1460 ml free water daily  NUTRITION DIAGNOSIS:   Moderate Malnutrition related to social / environmental circumstances as evidenced by mild fat depletion, mild muscle depletion.  Ongoing  GOAL:   Patient will meet greater than or equal to 90% of their needs  Met with TF  MONITOR:   Vent status, TF tolerance, Labs, I & O's   ASSESSMENT:   67 yo male with no PMH who was admitted on 2/13 after being found unresponsive outside by police with hypothermia, hypotension, elevated lactic acid. Required intubation on admission. Found to have extensive bilateral DVTs.  Discussed patient in ICU rounds and with RN today.  S/P tracheostomy 11/27. S/P PEG 2/28 (today), will be ready to use at 15:45.  TF is currently on hold.  Free water flushes 400 ml QID Patient remains intubated on ventilator support MV: 13 L/min Temp (24hrs), Avg:101.5 F (38.6 C), Min:99.1 F (37.3 C), Max:102.9 F (39.4 C)  Labs and medications reviewed. CBG's: 127-129-132 Sodium 161 (H), phosphorus 6.8 (H), potassium 5.1 (WNL)  Diet Order:  Diet NPO time specified Diet NPO time specified Except for: Sips with Meds  EDUCATION NEEDS:   No education needs have been identified at this time  Skin:  Skin Assessment: Skin Integrity Issues: Skin Integrity Issues:: Stage II, Other (Comment) Stage II: coccyx Other: gangrenous toes from frostbite   Last BM:  2/28  Height:   Ht Readings from Last 1 Encounters:  05/14/17 _0  (1.778 m)    Weight:   Wt Readings from Last 1 Encounters:  05/15/17 190 lb 0.6 oz (86.2 kg)    Ideal Body Weight:  75.5 kg  BMI:  Body mass index is 27.27 kg/m.  Estimated Nutritional Needs:   Kcal:  2150  Protein:  125-150  gm  Fluid:  2.1 L   Molli Barrows, RD, LDN, [place] Pager [phone] After Hours Pager [phone]

## 2017-05-16 NOTE — Progress Notes (Signed)
ANTICOAGULATION CONSULT NOTE - Follow Up Consult  Pharmacy Consult for heparin Indication: ACS/DVT/?PE  Labs: Recent Labs    05/14/17 0439 05/14/17 0442 05/14/17 1540 05/15/17 0400 05/15/17 2300  HGB 6.6*  --  8.1* 7.3*  --   HCT 21.6*  --  25.6* 23.7*  --   PLT 413*  --  450* 425*  --   HEPARINUNFRC 0.48  --   --  0.54 0.29*  CREATININE 4.77* 4.77*  --  4.57*  --     Assessment/Plan:  67yo male slightly subtherapeutic on heparin after resumed though likely needs more time to accumulate. Will continue gtt at current rate and check additional level with am labs.   Vernard GamblesVeronda Danaysha Kirn, PharmD, BCPS  05/16/2017,12:48 AM

## 2017-05-16 NOTE — Progress Notes (Signed)
Subjective: Interval History: to get PEG, fevers worsening Objective: Vital signs in last 24 hours: Temp:  [99.1 F (37.3 C)-102.9 F (39.4 C)] 102.9 F (39.4 C) (02/28 0411) Pulse Rate:  [97-116] 114 (02/28 0340) Resp:  [9-27] 26 (02/28 0340) BP: (84-139)/(45-75) 84/54 (02/28 0100) SpO2:  [93 %-100 %] 98 % (02/28 0340) Arterial Line BP: (100-158)/(46-61) 138/54 (02/27 1900) FiO2 (%):  [30 %-100 %] 30 % (02/28 0340) Weight change:   Intake/Output from previous day: 02/27 0701 - 02/28 0700 In: 1267.8 [I.V.:1017.8; NG/GT:150; IV Piggyback:100] Out: 3325 [Urine:3000; Emesis/NG output:325] Intake/Output this shift: No intake/output data recorded.  General appearance: on vent , trach, RIJ HD cath, moves , not coop Neck: RIJ cath Resp: rales RLL and rhonchi RLL Cardio: S1, S2 normal and systolic murmur: holosystolic 2/6, blowing at apex GI: soft, pos bs, liver down 5 cm Extremities: edema 2+  Lab Results: Recent Labs    05/15/17 0400 05/16/17 0440  WBC 13.1* 15.5*  HGB 7.3* 7.6*  HCT 23.7* 25.6*  PLT 425* 488*   BMET:  Recent Labs    05/15/17 0400 05/16/17 0440  NA 154* 161*  K 5.3* 5.1  CL 121* 126*  CO2 21* 20*  GLUCOSE 130* 138*  BUN 118* 112*  CREATININE 4.57* 4.84*  CALCIUM 8.0* 8.1*   No results for input(s): PTH in the last 72 hours. Iron Studies:  Recent Labs    05/13/17 0730  IRON 19*  TIBC 133*    Studies/Results: Dg Abd 1 View  Result Date: 05/15/2017 CLINICAL DATA:  67 year old male status post NG tube placement. EXAM: ABDOMEN - 1 VIEW COMPARISON:  Abdominal radiograph dated 05/14/2017 FINDINGS: An enteric tube extends into the distal stomach and folds back with side-port in the body of the stomach and tip in the fundus. No bowel dilatation is noted upper abdomen. IVC filter is seen. The osseous structures and soft tissues appear unremarkable. Bibasilar atelectatic changes noted. IMPRESSION: Enteric tube with tip in the stomach. Electronically  Signed   By: Elgie CollardArash  Radparvar M.D.   On: 05/15/2017 23:57   Dg Abd 1 View  Result Date: 05/14/2017 CLINICAL DATA:  OG tube placement. EXAM: ABDOMEN - 1 VIEW COMPARISON:  05/11/2017. FINDINGS: OG tube noted with tip in the upper stomach and side hole at the gastroesophageal junction. Advancement of approximately 8 cm should be considered. IVC filter noted with tip at the L2 level. Slightly prominent air-filled loops of small and large bowel noted. Adynamic ileus could present this fashion. IMPRESSION: OG tube noted with tip in the upper stomach and side hole at the gastroesophageal junction. Advancement of approximately 8 cm should be considered. Persistent slightly prominent air-filled loops of small and large bowel. Electronically Signed   By: Maisie Fushomas  Register   On: 05/14/2017 12:42   Dg Chest Port 1 View  Result Date: 05/16/2017 CLINICAL DATA:  67 year old male found down with hypothermia, lactic acidosis, bilateral DVTs. Tracheostomy placed yesterday. EXAM: PORTABLE CHEST 1 VIEW COMPARISON:  05/15/2017 and earlier. FINDINGS: Portable AP semi upright view at 0444 hours. Tracheostomy tube remains in place, the internal portion is mildly oblique today compared to yesterday. Stable bilateral IJ central lines. Enteric tube loops in the left upper quadrant. Stable lung volumes. Continued veiling opacity at the right lung base. No pneumothorax, pulmonary edema or other confluent opacity. Stable ventilation since yesterday. Visible bowel gas pattern within normal limits. IMPRESSION: 1. Tracheostomy tube positioning is more oblique compared to yesterday. Recommend bedside evaluation. 2.  Otherwise stable  lines and tubes. 3. Stable ventilation with continued mild veiling opacity at the right lung base which could reflect atelectasis, pneumonia, or small effusion. Electronically Signed   By: Odessa Fleming M.D.   On: 05/16/2017 06:51   Dg Chest Port 1 View  Result Date: 05/15/2017 CLINICAL DATA:  Tracheostomy EXAM:  PORTABLE CHEST 1 VIEW COMPARISON:  Portable exam 1252 hours compared to 05/14/2017 FINDINGS: Interval placement of tracheostomy tube with tip projecting over tracheal air column. BILATERAL jugular lines with tips projecting over SVC. Normal heart size mediastinal contours. Bibasilar atelectasis RIGHT greater than LEFT with question RIGHT pleural effusion. No pneumothorax. IMPRESSION: New tracheostomy tube without pneumothorax. Bibasilar atelectasis greater on RIGHT with suspect small RIGHT pleural effusion. Electronically Signed   By: Ulyses Southward M.D.   On: 05/15/2017 13:10   Dg Chest Port 1 View  Result Date: 05/14/2017 CLINICAL DATA:  ET tube placement. EXAM: PORTABLE CHEST 1 VIEW COMPARISON:  05/14/2017 FINDINGS: Endotracheal tube with the tip 4.2 cm above the carina. Nasogastric tube coursing below the diaphragm. Right jugular central venous catheter with the tip projecting over the SVC. Left jugular central venous catheter with the tip projecting over the SVC. Small right pleural effusion with right basilar airspace disease likely reflecting atelectasis. Mild left basilar atelectasis. No pneumothorax. Stable cardiomediastinal silhouette. No acute osseous abnormality. IMPRESSION: 1. Support lines and tubing in satisfactory position. 2. Small right pleural effusion.  Bibasilar atelectasis. Electronically Signed   By: Elige Ko   On: 05/14/2017 12:42    I have reviewed the patient's current medications.  Assessment/Plan: 1 AKI Cr ^ mild, low GFR. But nonoliguric. Mild acidemia.  Joseph Downs, needs more free water, and some vol. 2 Joseph Downs, will put on 1/4 NS 3 DVT/PE  Hep 4 Nutrition on TF for PEG 5 VDRF with trach 6 Fevers, feet, pulm, lines, can d/c HD cath P D5 1/4, d/c IJ HD cath    LOS: 15 days   Joseph Downs 05/16/2017,7:17 AM

## 2017-05-16 NOTE — Progress Notes (Signed)
240 Fentanyl IV wasted in sink by Meredith StaggersLinda Bass RN

## 2017-05-16 NOTE — Progress Notes (Signed)
SLP Cancellation Note  Patient Details Name: Joseph Downs MRN: [id number] DOB: 1950-10-23   Cancelled treatment:       Reason Eval/Treat Not Completed: Medical issues which prohibited therapy. Pt trached 2/27, MD note today indicates that pt remains sedated, getting PEG today. Will f/u for readiness to participate in PMV and PO trials as soon as able.   Joseph Downs, Joseph Downs 05/16/2017, 8:54 AM  Joseph Downs, Joseph Downs [phone]

## 2017-05-17 ENCOUNTER — Ambulatory Visit (HOSPITAL_COMMUNITY): Admission: RE | Admit: 2017-05-17 | Payer: Medicare Other | Source: Ambulatory Visit | Admitting: Vascular Surgery

## 2017-05-17 ENCOUNTER — Encounter (HOSPITAL_COMMUNITY): Admission: RE | Payer: Self-pay | Source: Ambulatory Visit

## 2017-05-17 ENCOUNTER — Inpatient Hospital Stay (HOSPITAL_COMMUNITY): Payer: Medicare Other

## 2017-05-17 DIAGNOSIS — G9340 Encephalopathy, unspecified: Secondary | ICD-10-CM

## 2017-05-17 LAB — RENAL FUNCTION PANEL
Albumin: 1.5 g/dL — ABNORMAL LOW (ref 3.5–5.0)
Anion gap: 12 (ref 5–15)
BUN: 104 mg/dL — ABNORMAL HIGH (ref 6–20)
CALCIUM: 7.8 mg/dL — AB (ref 8.9–10.3)
CO2: 22 mmol/L (ref 22–32)
CREATININE: 4.64 mg/dL — AB (ref 0.61–1.24)
Chloride: 125 mmol/L — ABNORMAL HIGH (ref 101–111)
GFR calc non Af Amer: 12 mL/min — ABNORMAL LOW (ref >60)
GFR, EST AFRICAN AMERICAN: 14 mL/min — AB (ref >60)
Glucose, Bld: 172 mg/dL — ABNORMAL HIGH (ref 65–99)
Phosphorus: 5.7 mg/dL — ABNORMAL HIGH (ref 2.5–4.6)
Potassium: 4.2 mmol/L (ref 3.5–5.1)
SODIUM: 159 mmol/L — AB (ref 135–145)

## 2017-05-17 LAB — GLUCOSE, CAPILLARY
GLUCOSE-CAPILLARY: 124 mg/dL — AB (ref 65–99)
GLUCOSE-CAPILLARY: 131 mg/dL — AB (ref 65–99)
GLUCOSE-CAPILLARY: 144 mg/dL — AB (ref 65–99)
GLUCOSE-CAPILLARY: 144 mg/dL — AB (ref 65–99)
GLUCOSE-CAPILLARY: 147 mg/dL — AB (ref 65–99)
Glucose-Capillary: 126 mg/dL — ABNORMAL HIGH (ref 65–99)
Glucose-Capillary: 132 mg/dL — ABNORMAL HIGH (ref 65–99)

## 2017-05-17 LAB — CBC WITH DIFFERENTIAL/PLATELET
BASOS PCT: 0 %
Basophils Absolute: 0 10*3/uL (ref 0.0–0.1)
Basophils Absolute: 0 10*3/uL (ref 0.0–0.1)
Basophils Relative: 0 %
EOS ABS: 1.1 10*3/uL — AB (ref 0.0–0.7)
Eosinophils Absolute: 0.7 10*3/uL (ref 0.0–0.7)
Eosinophils Relative: 5 %
Eosinophils Relative: 8 %
HCT: 20.9 % — ABNORMAL LOW (ref 39.0–52.0)
HCT: 22.8 % — ABNORMAL LOW (ref 39.0–52.0)
HEMOGLOBIN: 6.9 g/dL — AB (ref 13.0–17.0)
Hemoglobin: 6.3 g/dL — CL (ref 13.0–17.0)
LYMPHS ABS: 1.7 10*3/uL (ref 0.7–4.0)
Lymphocytes Relative: 11 %
Lymphocytes Relative: 12 %
Lymphs Abs: 1.8 10*3/uL (ref 0.7–4.0)
MCH: 29.2 pg (ref 26.0–34.0)
MCH: 28.5 pg (ref 26.0–34.0)
MCHC: 30.1 g/dL (ref 30.0–36.0)
MCHC: 30.3 g/dL (ref 30.0–36.0)
MCV: 96.8 fL (ref 78.0–100.0)
MCV: 94.2 fL (ref 78.0–100.0)
Monocytes Absolute: 0.4 10*3/uL (ref 0.1–1.0)
Monocytes Absolute: 0.3 10*3/uL (ref 0.1–1.0)
Monocytes Relative: 3 %
Monocytes Relative: 2 %
NEUTROS PCT: 78 %
Neutro Abs: 12.6 10*3/uL — ABNORMAL HIGH (ref 1.7–7.7)
Neutro Abs: 11 10*3/uL — ABNORMAL HIGH (ref 1.7–7.7)
Neutrophils Relative %: 81 %
PLATELETS: 346 10*3/uL (ref 150–400)
Platelets: 409 10*3/uL — ABNORMAL HIGH (ref 150–400)
RBC: 2.16 MIL/uL — ABNORMAL LOW (ref 4.22–5.81)
RBC: 2.42 MIL/uL — AB (ref 4.22–5.81)
RDW: 15.7 % — ABNORMAL HIGH (ref 11.5–15.5)
RDW: 15.9 % — ABNORMAL HIGH (ref 11.5–15.5)
WBC: 15.6 10*3/uL — ABNORMAL HIGH (ref 4.0–10.5)
WBC: 14.1 10*3/uL — AB (ref 4.0–10.5)

## 2017-05-17 LAB — HEPARIN LEVEL (UNFRACTIONATED)
HEPARIN UNFRACTIONATED: 0.51 [IU]/mL (ref 0.30–0.70)
Heparin Unfractionated: 0.37 IU/mL (ref 0.30–0.70)
Heparin Unfractionated: 0.55 IU/mL (ref 0.30–0.70)
Heparin Unfractionated: 0.46 IU/mL (ref 0.30–0.70)

## 2017-05-17 LAB — BASIC METABOLIC PANEL
Anion gap: 11 (ref 5–15)
BUN: 100 mg/dL — ABNORMAL HIGH (ref 6–20)
CO2: 20 mmol/L — ABNORMAL LOW (ref 22–32)
Calcium: 7.2 mg/dL — ABNORMAL LOW (ref 8.9–10.3)
Chloride: 124 mmol/L — ABNORMAL HIGH (ref 101–111)
Creatinine, Ser: 4.18 mg/dL — ABNORMAL HIGH (ref 0.61–1.24)
GFR calc Af Amer: 16 mL/min — ABNORMAL LOW (ref >60)
GFR calc non Af Amer: 14 mL/min — ABNORMAL LOW (ref >60)
Glucose, Bld: 148 mg/dL — ABNORMAL HIGH (ref 65–99)
Potassium: 4 mmol/L (ref 3.5–5.1)
Sodium: 155 mmol/L — ABNORMAL HIGH (ref 135–145)

## 2017-05-17 LAB — POCT I-STAT 3, ART BLOOD GAS (G3+)
ACID-BASE DEFICIT: 3 mmol/L — AB (ref 0.0–2.0)
BICARBONATE: 21.2 mmol/L (ref 20.0–28.0)
O2 Saturation: 99 %
PH ART: 7.401 (ref 7.350–7.450)
PO2 ART: 120 mmHg — AB (ref 83.0–108.0)
TCO2: 22 mmol/L (ref 22–32)
pCO2 arterial: 34.2 mmHg (ref 32.0–48.0)

## 2017-05-17 LAB — MAGNESIUM: Magnesium: 2.2 mg/dL (ref 1.7–2.4)

## 2017-05-17 LAB — PREPARE RBC (CROSSMATCH)

## 2017-05-17 SURGERY — ABDOMINAL AORTOGRAM W/LOWER EXTREMITY
Anesthesia: LOCAL

## 2017-05-17 MED ORDER — SODIUM CHLORIDE 0.9 % IV SOLN
Freq: Once | INTRAVENOUS | Status: AC
  Administered 2017-05-17: 13:00:00 via INTRAVENOUS

## 2017-05-17 MED ORDER — SODIUM CHLORIDE 0.9 % IV SOLN
Freq: Once | INTRAVENOUS | Status: AC
  Administered 2017-05-18: 10 mL/h via INTRAVENOUS

## 2017-05-17 NOTE — Progress Notes (Signed)
CRITICAL VALUE ALERT  Critical Value:  hgb 6.3 Date & Time Notied:  05/17/2017 0736  Provider Notified: Dr. Darrick Pennaeterding  Orders Received/Actions taken: MD on floor made aware

## 2017-05-17 NOTE — Progress Notes (Addendum)
PULMONARY / CRITICAL CARE MEDICINE   Name: Joseph Downs MRN: [id number] DOB: [birth year]    ADMISSION DATE:  05/01/2017 CONSULTATION DATE:  05/01/17  REFERRING MD:  Dr. Deretha EmoryZackowski  CHIEF COMPLAINT:  Found down  HISTORY OF PRESENT ILLNESS:   67 year old homeless man found down/13 with hypothermia, shock lactic acidosis. Found to have bilateral DVTs and evidence of RV strain on echo He had Acinetobacter in blood and staph in urine with elevated PSA. Unable to be weaned from vent, trach placed 2/27, plans to place PEG 2/28  STUDIES:  ECHO 2/14 - EF 65-70%, G1DD, RV strain Echo 2/21 - EF 55-60%, RV improved  CULTURES: MRSA PCR negative Ucx + staph lugdenensis Blood cx 2/13 >> Diphtheroids + Acinetobacter lwoffii Blood cx 2/19 >>no growth x 5 days Blood cx 2/20 >> no growth x 5 days  ANTIBIOTICS: Zosyn 2/13> 2/18 Vanc 2/13 >>2/19 Meropenem 2/18 >>  SIGNIFICANT EVENTS: 2/13 admitted, intubated 2/22 IVC filter placed 2/25 1u PRBC 2/27 Trach  LINES/TUBES: ETT 2/13 >> 2/27 Trach 2/27 Foley 2/13 PEG 2/28 > Right IJ HD catheter 2/15 >> Left IJ CVC 2/15 >>  SUBJECTIVE:  PEG placed yesterday. No issues. Immediately failed vent wean this AM.  VITAL SIGNS: BP (!) 147/77   Pulse (!) 124   Temp (!) 100.5 F (38.1 C) (Axillary)   Resp (!) 30   Ht 5\' 10"  (1.778 m)   Wt 84.2 kg (185 lb 10 oz)   SpO2 100%   BMI 26.63 kg/m   HEMODYNAMICS:    VENTILATOR SETTINGS: Vent Mode: PRVC FiO2 (%):  [30 %] 30 % Set Rate:  [18 bmp] 18 bmp Vt Set:  [580 mL] 580 mL PEEP:  [5 cmH20] 5 cmH20 Pressure Support:  [5 cmH20] 5 cmH20 Plateau Pressure:  [16 cmH20-21 cmH20] 16 cmH20  INTAKE / OUTPUT: I/O last 3 completed shifts: In: 5814.1 [I.V.:3714.1; NG/GT:2000; IV Piggyback:100] Out: 3880 [Urine:3680; Stool:200]  PHYSICAL EXAMINATION: General: elderly appearing black male on vent via trach Neuro: does not arouse this AM. Intermittent agitation.  HEENT: dry lips and dry MM.   Cardiovascular: Tachy, regular no MRG Lungs: coarse bases. Tachypneic, breathing well over sent vent rate.  Abdomen: Soft, benign, positive bowel sounds Musculoskeletal: SCDs in place, trace edema Skin: skin sloughing feet bilaterally, dry gangrene toes  LABS:  BMET Recent Labs  Lab 05/15/17 0400 05/16/17 0440 05/17/17 0500  NA 154* 161* 159*  K 5.3* 5.1 4.2  CL 121* 126* 125*  CO2 21* 20* 22  BUN 118* 112* 104*  CREATININE 4.57* 4.84* 4.64*  GLUCOSE 130* 138* 172*    Electrolytes Recent Labs  Lab 05/13/17 0430  05/14/17 0439  05/15/17 0400 05/16/17 0440 05/17/17 0500  CALCIUM 7.8*   < > 8.1*   < > 8.0* 8.1* 7.8*  MG 2.6*  --  2.3  --   --   --  2.2  PHOS 7.1*   < > 6.8*   < > 7.1* 6.8* 5.7*   < > = values in this interval not displayed.    CBC Recent Labs  Lab 05/15/17 0400 05/16/17 0440 05/17/17 0630  WBC 13.1* 15.5* 15.6*  HGB 7.3* 7.6* 6.3*  HCT 23.7* 25.6* 20.9*  PLT 425* 488* 409*    Coag's No results for input(s): APTT, INR in the last 168 hours.  Sepsis Markers Recent Labs  Lab 05/10/17 1112 05/11/17 0345 05/12/17 0301  PROCALCITON 4.78 4.23 3.65    ABG Recent Labs  Lab 05/14/17 0348  05/17/17 0341  PHART 7.335* 7.401  PCO2ART 36.6 34.2  PO2ART 141* 120.0*    Liver Enzymes Recent Labs  Lab 05/15/17 0400 05/16/17 0440 05/17/17 0500  ALBUMIN 1.4* 1.5* 1.5*    Cardiac Enzymes No results for input(s): TROPONINI, PROBNP in the last 168 hours.  Glucose Recent Labs  Lab 05/16/17 1154 05/16/17 1537 05/16/17 2015 05/16/17 2358 05/17/17 0341 05/17/17 0753  GLUCAP 132* 138* 139* 144* 144* 147*    Imaging Dg Chest Port 1 View  Result Date: 05/17/2017 CLINICAL DATA:  Endotracheal tube EXAM: PORTABLE CHEST 1 VIEW COMPARISON:  05/16/2017 FINDINGS: Tracheostomy tube in place. Left IJ line with tip at the SVC. There is a low volume chest with right more than left streaky opacity. No edema, effusion, or pneumothorax. Normal heart  size. IMPRESSION: 1. Remaining hardware in stable position. 2. Stable chest. Low volumes with atelectatic opacities in the right more than left. Electronically Signed   By: Marnee Spring M.D.   On: 05/17/2017 06:50   DISCUSSION: Bilateral lower extremity DVT, right heart strain by echocardiogram very suggestive of massive PE.  He had polymicrobial bacteremia, no evidence of valvular disease by echo, not yet speciated.  Also with Staph lugdunensis UTI (often associated with prostate cancer). PSA elevated. Acinetobacter in blood could be contaminant or related to cellulitis AKI -off  CVVHD. Diuresing well with lasix.   ASSESSMENT / PLAN:  PULMONARY A: Acute respiratory failure on ventilator- trach 2/27 Likely pulmonary embolism with associated right heart strain P:   Wean trach vent support as able Trach care Continue heparin infusion for PE Check ABG today as failed wean and remains tachypneic.   CARDIOVASCULAR A:  Stress related non-ST elevation MI, suspect right heart strain Acute Cor pulmonale P:  Cards has signed off for now He will require an ischemic evaluation once stable  RENAL A:   Acute non-oliguric renal failure-Hypernatremia  Hyperkalemia- resolved Rhabdomyolysis-improving P:   Nephrology following, appreciate recommendations Free water flushes Sodium bicarb tablets DC'd Follow BMP, urine output Hold further diuresis for now  GASTROINTESTINAL A:   suspect shock liver - resolved P:   Continue PPI Continue tube feeding via PEG  HEMATOLOGIC / ONC A:   Bilateral DVTs S/p IVC filter Anemia Elevated PSA, Staph lugdunensis both suggestive of possible prostatic CA P:  Heparin drip Type and screen. Transfuse one unit PRBC. No reason to hold heparin yet. Will continue to monitor. Follow CBC He will need further evaluation for possible prostate cancer once he stabilizes from his critical illness  INFECTIOUS A:   UTI-  >100,000 colonies staph lugdenenis s/p  7 days of vanc ? PNA vs ATX on CXR Acinetobacter bacteremia.  P:   continue meropenem x 2 weeks- on Day # 11 Wound care for his bilateral foot wounds Trend WBC, fevers  ENDOCRINE A:   hyperglycemia P:   Follow CBG Sliding scale insulin per protocol  WOUND CARE A: Bilateral feet with evidence for skin sloughing P: Continue current dressing changes Vascular surgery following, defer arteriogram for now due to poor renal function  NEUROLOGIC A:   Toxic metabolic encephalopathy Agitation P:   RASS goal: 0  Off IV sedation.  Continue seroquel and klonopin for agitation  FAMILY  - Updates: No family available, being updated by CSW for placement concerns. No changes clinically today.  - Inter-disciplinary family meet or Palliative Care meeting due by:   Joneen Roach, AGACNP-BC [facility] Pager [phone] or [phone]  05/17/2017 10:26 AM  Attending Note:  67 year old homeless man who suffered frost bite in both legs who presents in septic shock and respiratory failure.  Patient failed to make any progress from a respiratory standpoint and had a trach/peg placed.  Patient was unable to tolerate CPAP/PS on exam with low Tv and high RR.  PEG placed by trauma surgery today and doing well.  I reviewed CXR myself, trach is in good position.  Continue to attempt weaning for now. Continue diureses as able, renal failure and renal following.  Start TF in AM.  PCCM will continue to follow.  The patient is critically ill with multiple organ systems failure and requires high complexity decision making for assessment and support, frequent evaluation and titration of therapies, application of advanced monitoring technologies and extensive interpretation of multiple databases.   Critical Care Time devoted to patient care services described in this note is  35  Minutes. This time reflects time of care of this signee Dr Koren Bound. This critical care time does not  reflect procedure time, or teaching time or supervisory time of PA/NP/Med student/Med Resident etc but could involve care discussion time.  Alyson Reedy, M.D. [facility] Pulmonary/Critical Care Medicine. Pager: [phone]. After hours pager: [phone].

## 2017-05-17 NOTE — Progress Notes (Signed)
ANTICOAGULATION CONSULT NOTE  Pharmacy Consult for Heparin  Indication: chest pain/ACS/DVT/?PE  No Known Allergies  Patient Measurements: Height: 5\' 10"  (177.8 cm) Weight: 185 lb 10 oz (84.2 kg) IBW/kg (Calculated) : 73  Vital Signs: Temp: 100.6 F (38.1 C) (03/01 1136) Temp Source: Axillary (03/01 1136) BP: 100/64 (03/01 1000) Pulse Rate: 118 (03/01 1000)  Labs: Recent Labs    05/15/17 0400  05/16/17 0440  05/17/17 0500 05/17/17 0610 05/17/17 0630 05/17/17 0747 05/17/17 1022  HGB 7.3*  --  7.6*  --   --   --  6.3*  --   --   HCT 23.7*  --  25.6*  --   --   --  20.9*  --   --   PLT 425*  --  488*  --   --   --  409*  --   --   HEPARINUNFRC 0.54   < > <0.10*   < >  --  0.51  --  0.55 0.46  CREATININE 4.57*  --  4.84*  --  4.64*  --   --   --   --    < > = values in this interval not displayed.     Assessment: [name] in the ICU after being "found down".Patient has been found to have bilateral lower extremity DVTs with concern for possible PE.  Currently on IV heparin at 1600 units/hr. HL is therapeutic. Hgb down to 6.3 requiring transfusion but no overt s/s of bleeding. Team wishes to continue IV heparin.   Goal of Therapy:  Heparin level 0.3-0.7 units/ml Monitor platelets by anticoagulation protocol: Yes   Plan:  Continue heparin at 1600 units/hr Monitor daily HL and CBC   Vinnie LevelBenjamin Mantaj Chamberlin, PharmD., BCPS Clinical Pharmacist Pager [phone]

## 2017-05-17 NOTE — Plan of Care (Signed)
  Progressing Nutrition: Adequate nutrition will be maintained 05/17/2017 1524 - Progressing by Marcelino ScotWilson, Romulus Hanrahan G, RN Elimination: Will not experience complications related to bowel motility 05/17/2017 1524 - Progressing by Marcelino ScotWilson, Kalaysia Demonbreun G, RN Will not experience complications related to urinary retention 05/17/2017 1524 - Progressing by Marcelino ScotWilson, Kerline Trahan G, RN Skin Integrity: Risk for impaired skin integrity will decrease 05/17/2017 1524 - Progressing by Marcelino ScotWilson, Jaymie Misch G, RN Respiratory: Ability to maintain adequate ventilation will improve 05/17/2017 1524 - Progressing by Marcelino ScotWilson, Rawan Riendeau G, RN Phase I Progression Outcomes Tolerating diet 05/17/2017 1524 - Progressing by Marcelino ScotWilson, Afrika Brick G, RN Voiding-avoid urinary catheter unless indicated 05/17/2017 1524 - Progressing by Marcelino ScotWilson, Jordynn Perrier G, RN Note Foley d/c condom cath placed

## 2017-05-17 NOTE — Progress Notes (Signed)
Joneen RoachPaul Hoffman NP made aware of pt temp prior to blood infusion. Ok to transfuse. Will give PRN tylenol to treat. Will continue to monitor pt.

## 2017-05-17 NOTE — Progress Notes (Signed)
Patient ID: Joseph Downs, male   DOB: 1951/02/25, 67 y.o.   MRN: [id number] Abd soft PEG site OK Tolerating TF Please continue abdominal binder at all times to protect PEG Please call if any issues  Violeta GelinasBurke Binnie Droessler, MD, MPH, FACS Trauma: [phone] General Surgery: [phone]

## 2017-05-17 NOTE — Progress Notes (Signed)
Subjective: Interval History: sedated on vent.Had PEG yest.  Objective: Vital signs in last 24 hours: Temp:  [98.1 F (36.7 C)-102.2 F (39 C)] 99.3 F (37.4 C) (03/01 0342) Pulse Rate:  [95-114] 104 (03/01 0700) Resp:  [19-35] 25 (03/01 0700) BP: (103-144)/(57-108) 144/77 (03/01 0700) SpO2:  [98 %-100 %] 99 % (03/01 0700) Arterial Line BP: (95-125)/(51-81) 103/59 (03/01 0200) FiO2 (%):  [30 %] 30 % (03/01 0400) Weight:  [84.2 kg (185 lb 10 oz)] 84.2 kg (185 lb 10 oz) (03/01 0500) Weight change:   Intake/Output from previous day: 02/28 0701 - 03/01 0700 In: 4845 [I.V.:2970; NG/GT:1775; IV Piggyback:100] Out: 2480 [Urine:2280; Stool:200] Intake/Output this shift: No intake/output data recorded.  General appearance: nonresponsive on vent Neck: Trach Resp: rales bibasilar and rhonchi bibasilar Cardio: S1, S2 normal and systolic murmur: systolic ejection 2/6, decrescendo at 2nd left intercostal space GI: soft, Peg upper abd, pos bs Extremities: edema 2-3+, more on L  Lab Results: Recent Labs    05/16/17 0440 05/17/17 0630  WBC 15.5* 15.6*  HGB 7.6* 6.3*  HCT 25.6* 20.9*  PLT 488* 409*   BMET:  Recent Labs    05/15/17 0400 05/16/17 0440  NA 154* 161*  K 5.3* 5.1  CL 121* 126*  CO2 21* 20*  GLUCOSE 130* 138*  BUN 118* 112*  CREATININE 4.57* 4.84*  CALCIUM 8.0* 8.1*   No results for input(s): PTH in the last 72 hours. Iron Studies: No results for input(s): IRON, TIBC, TRANSFERRIN, FERRITIN in the last 72 hours.  Studies/Results: Dg Abd 1 View  Result Date: 05/15/2017 CLINICAL DATA:  67 year old male status post NG tube placement. EXAM: ABDOMEN - 1 VIEW COMPARISON:  Abdominal radiograph dated 05/14/2017 FINDINGS: An enteric tube extends into the distal stomach and folds back with side-port in the body of the stomach and tip in the fundus. No bowel dilatation is noted upper abdomen. IVC filter is seen. The osseous structures and soft tissues appear unremarkable.  Bibasilar atelectatic changes noted. IMPRESSION: Enteric tube with tip in the stomach. Electronically Signed   By: Elgie Collard M.D.   On: 05/15/2017 23:57   Dg Chest Port 1 View  Result Date: 05/17/2017 CLINICAL DATA:  Endotracheal tube EXAM: PORTABLE CHEST 1 VIEW COMPARISON:  05/16/2017 FINDINGS: Tracheostomy tube in place. Left IJ line with tip at the SVC. There is a low volume chest with right more than left streaky opacity. No edema, effusion, or pneumothorax. Normal heart size. IMPRESSION: 1. Remaining hardware in stable position. 2. Stable chest. Low volumes with atelectatic opacities in the right more than left. Electronically Signed   By: Marnee Spring M.D.   On: 05/17/2017 06:50   Dg Chest Port 1 View  Result Date: 05/16/2017 CLINICAL DATA:  67 year old male found down with hypothermia, lactic acidosis, bilateral DVTs. Tracheostomy placed yesterday. EXAM: PORTABLE CHEST 1 VIEW COMPARISON:  05/15/2017 and earlier. FINDINGS: Portable AP semi upright view at 0444 hours. Tracheostomy tube remains in place, the internal portion is mildly oblique today compared to yesterday. Stable bilateral IJ central lines. Enteric tube loops in the left upper quadrant. Stable lung volumes. Continued veiling opacity at the right lung base. No pneumothorax, pulmonary edema or other confluent opacity. Stable ventilation since yesterday. Visible bowel gas pattern within normal limits. IMPRESSION: 1. Tracheostomy tube positioning is more oblique compared to yesterday. Recommend bedside evaluation. 2.  Otherwise stable lines and tubes. 3. Stable ventilation with continued mild veiling opacity at the right lung base which could reflect atelectasis,  pneumonia, or small effusion. Electronically Signed   By: Odessa FlemingH  Hall M.D.   On: 05/16/2017 06:51   Dg Chest Port 1 View  Result Date: 05/15/2017 CLINICAL DATA:  Tracheostomy EXAM: PORTABLE CHEST 1 VIEW COMPARISON:  Portable exam 1252 hours compared to 05/14/2017 FINDINGS:  Interval placement of tracheostomy tube with tip projecting over tracheal air column. BILATERAL jugular lines with tips projecting over SVC. Normal heart size mediastinal contours. Bibasilar atelectasis RIGHT greater than LEFT with question RIGHT pleural effusion. No pneumothorax. IMPRESSION: New tracheostomy tube without pneumothorax. Bibasilar atelectasis greater on RIGHT with suspect small RIGHT pleural effusion. Electronically Signed   By: Ulyses SouthwardMark  Boles M.D.   On: 05/15/2017 13:10    I have reviewed the patient's current medications.  Assessment/Plan: 1 AKI chem pending, good urine vol.   2 Anemia worse, will transfuse 3 VDRF per CCM 4 DVT/PE on anticoag 5 Sepsis resolved 6 COPD 7 VDRF 8 Nutrition PEG, TF P check chem, follow SNa, K, bicarb,  Cont Vent , TF, free water    LOS: 16 days   Sola Margolis 05/17/2017,7:48 AM

## 2017-05-17 NOTE — Progress Notes (Signed)
ANTICOAGULATION CONSULT NOTE  Pharmacy Consult for Heparin  Indication: chest pain/ACS/DVT/?PE  No Known Allergies  Patient Measurements: Height: 5\' 10"  (177.8 cm) Weight: 190 lb 0.6 oz (86.2 kg) IBW/kg (Calculated) : 73  Vital Signs: Temp: 99.3 F (37.4 C) (03/01 0000) Temp Source: Oral (03/01 0000) BP: 103/64 (03/01 0000) Pulse Rate: 100 (03/01 0000)  Labs: Recent Labs    05/14/17 0442 05/14/17 1540 05/15/17 0400 05/15/17 2300 05/16/17 0440 05/17/17 0231  HGB  --  8.1* 7.3*  --  7.6*  --   HCT  --  25.6* 23.7*  --  25.6*  --   PLT  --  450* 425*  --  488*  --   HEPARINUNFRC  --   --  0.54 0.29* <0.10* 0.37  CREATININE 4.77*  --  4.57*  --  4.84*  --      Assessment: [name] in the ICU after being "found down".Patient has been found to have bilateral lower extremity DVTs with concern for possible PE.  Heparin level is therapeutic x 1 this AM after re-start s/p PEG placement  Goal of Therapy:  Heparin level 0.3-0.7 units/ml Monitor platelets by anticoagulation protocol: Yes   Plan:  Continue heparin at 1600 units/hr 1100 heparin level  Joseph DukeJames Loneta Downs, PharmD, BCPS Clinical Pharmacist Phone: [phone]

## 2017-05-18 DIAGNOSIS — L97903 Non-pressure chronic ulcer of unspecified part of unspecified lower leg with necrosis of muscle: Secondary | ICD-10-CM

## 2017-05-18 LAB — RENAL FUNCTION PANEL
ANION GAP: 11 (ref 5–15)
Albumin: 1.5 g/dL — ABNORMAL LOW (ref 3.5–5.0)
BUN: 94 mg/dL — ABNORMAL HIGH (ref 6–20)
CALCIUM: 7.7 mg/dL — AB (ref 8.9–10.3)
CO2: 20 mmol/L — AB (ref 22–32)
Chloride: 125 mmol/L — ABNORMAL HIGH (ref 101–111)
Creatinine, Ser: 3.94 mg/dL — ABNORMAL HIGH (ref 0.61–1.24)
GFR calc non Af Amer: 15 mL/min — ABNORMAL LOW (ref >60)
GFR, EST AFRICAN AMERICAN: 17 mL/min — AB (ref >60)
Glucose, Bld: 138 mg/dL — ABNORMAL HIGH (ref 65–99)
POTASSIUM: 4 mmol/L (ref 3.5–5.1)
Phosphorus: 5.5 mg/dL — ABNORMAL HIGH (ref 2.5–4.6)
SODIUM: 156 mmol/L — AB (ref 135–145)

## 2017-05-18 LAB — CBC
HCT: 31 % — ABNORMAL LOW (ref 39.0–52.0)
HCT: 25.5 % — ABNORMAL LOW (ref 39.0–52.0)
HEMOGLOBIN: 9.5 g/dL — AB (ref 13.0–17.0)
HEMOGLOBIN: 7.9 g/dL — AB (ref 13.0–17.0)
MCH: 28.7 pg (ref 26.0–34.0)
MCH: 29.4 pg (ref 26.0–34.0)
MCHC: 30.6 g/dL (ref 30.0–36.0)
MCHC: 31 g/dL (ref 30.0–36.0)
MCV: 93.7 fL (ref 78.0–100.0)
MCV: 94.8 fL (ref 78.0–100.0)
Platelets: 316 10*3/uL (ref 150–400)
Platelets: 352 10*3/uL (ref 150–400)
RBC: 3.31 MIL/uL — AB (ref 4.22–5.81)
RBC: 2.69 MIL/uL — AB (ref 4.22–5.81)
RDW: 15.8 % — ABNORMAL HIGH (ref 11.5–15.5)
RDW: 16.1 % — ABNORMAL HIGH (ref 11.5–15.5)
WBC: 11.4 10*3/uL — ABNORMAL HIGH (ref 4.0–10.5)
WBC: 12 10*3/uL — ABNORMAL HIGH (ref 4.0–10.5)

## 2017-05-18 LAB — BASIC METABOLIC PANEL
Anion gap: 10 (ref 5–15)
BUN: 94 mg/dL — ABNORMAL HIGH (ref 6–20)
CHLORIDE: 124 mmol/L — AB (ref 101–111)
CO2: 20 mmol/L — ABNORMAL LOW (ref 22–32)
Calcium: 7.7 mg/dL — ABNORMAL LOW (ref 8.9–10.3)
Creatinine, Ser: 3.89 mg/dL — ABNORMAL HIGH (ref 0.61–1.24)
GFR calc non Af Amer: 15 mL/min — ABNORMAL LOW (ref >60)
GFR, EST AFRICAN AMERICAN: 17 mL/min — AB (ref >60)
Glucose, Bld: 136 mg/dL — ABNORMAL HIGH (ref 65–99)
POTASSIUM: 3.9 mmol/L (ref 3.5–5.1)
SODIUM: 154 mmol/L — AB (ref 135–145)

## 2017-05-18 LAB — GLUCOSE, CAPILLARY
GLUCOSE-CAPILLARY: 118 mg/dL — AB (ref 65–99)
GLUCOSE-CAPILLARY: 93 mg/dL (ref 65–99)
GLUCOSE-CAPILLARY: 106 mg/dL — AB (ref 65–99)
Glucose-Capillary: 116 mg/dL — ABNORMAL HIGH (ref 65–99)
Glucose-Capillary: 117 mg/dL — ABNORMAL HIGH (ref 65–99)
Glucose-Capillary: 120 mg/dL — ABNORMAL HIGH (ref 65–99)

## 2017-05-18 LAB — HEPARIN LEVEL (UNFRACTIONATED)
HEPARIN UNFRACTIONATED: 0.34 [IU]/mL (ref 0.30–0.70)
Heparin Unfractionated: <0.1 IU/mL — ABNORMAL LOW (ref 0.30–0.70)

## 2017-05-18 MED ORDER — CLONAZEPAM 0.1 MG/ML ORAL SUSPENSION
1.0000 mg | Freq: Every day | ORAL | Status: DC
  Filled 2017-05-18: qty 10

## 2017-05-18 MED ORDER — QUETIAPINE FUMARATE 100 MG PO TABS
100.0000 mg | ORAL_TABLET | Freq: Every day | ORAL | Status: DC
  Administered 2017-05-18: 100 mg
  Filled 2017-05-18: qty 1

## 2017-05-18 MED ORDER — CLONAZEPAM 0.5 MG PO TBDP
1.0000 mg | ORAL_TABLET | Freq: Every day | ORAL | Status: DC
  Administered 2017-05-18 – 2017-05-19 (×2): 1 mg
  Filled 2017-05-18 (×2): qty 2

## 2017-05-18 MED ORDER — MELATONIN 3 MG PO TABS
6.0000 mg | ORAL_TABLET | Freq: Every day | ORAL | Status: DC
  Administered 2017-05-18 – 2017-06-12 (×26): 6 mg via ORAL
  Filled 2017-05-18 (×27): qty 2

## 2017-05-18 MED ORDER — HEPARIN (PORCINE) IN NACL 100-0.45 UNIT/ML-% IJ SOLN
1600.0000 [IU]/h | INTRAMUSCULAR | Status: AC
  Administered 2017-05-18 – 2017-05-24 (×9): 1600 [IU]/h via INTRAVENOUS
  Filled 2017-05-18 (×17): qty 250

## 2017-05-18 MED ORDER — VITAL AF 1.2 CAL PO LIQD
1000.0000 mL | ORAL | Status: AC
  Administered 2017-05-18 – 2017-05-28 (×12): 1000 mL
  Filled 2017-05-18 (×13): qty 1000

## 2017-05-18 MED ORDER — CLONAZEPAM 0.5 MG PO TBDP
0.5000 mg | ORAL_TABLET | Freq: Every day | ORAL | Status: DC
  Administered 2017-05-18 – 2017-05-19 (×2): 0.5 mg
  Filled 2017-05-18 (×2): qty 1

## 2017-05-18 MED ORDER — DEXTROSE 5 % IV SOLN
INTRAVENOUS | Status: DC
  Administered 2017-05-18: 08:00:00 via INTRAVENOUS

## 2017-05-18 MED ORDER — QUETIAPINE FUMARATE 50 MG PO TABS
50.0000 mg | ORAL_TABLET | Freq: Every day | ORAL | Status: DC
  Administered 2017-05-18: 50 mg
  Filled 2017-05-18 (×2): qty 1

## 2017-05-18 MED ORDER — DEXTROSE-NACL 5-0.45 % IV SOLN
INTRAVENOUS | Status: DC
  Administered 2017-05-18: 15:00:00 via INTRAVENOUS

## 2017-05-18 MED ORDER — NON FORMULARY: 5.0000 mg | Freq: Every day | Status: DC

## 2017-05-18 NOTE — Progress Notes (Signed)
eLink Physician-Brief Progress Note Patient Name: Joseph FloridaLarry E Downs DOB: 10-04-1950 MRN: [id number]     Recent Labs  Lab 05/14/17 1540 05/15/17 0400 05/16/17 0440 05/17/17 0630 05/17/17 2300  HGB 8.1* 7.3* 7.6* 6.3* 6.9*    Hgb went from 6.3 -> 6.9 ater 1 unit prbc RN reporting oozing of blod around PEG  Plan Stop IV heprain 1 more unit prbc  Intervention Category Major Interventions: Hemorrhage - evaluation and management  Kelyn Koskela 05/18/2017, 12:06 AM

## 2017-05-18 NOTE — Progress Notes (Signed)
ANTICOAGULATION CONSULT NOTE  Pharmacy Consult for Heparin  Indication: chest pain/ACS/DVT/?PE  No Known Allergies  Patient Measurements: Height: 5\' 10"  (177.8 cm) Weight: 191 lb 5.8 oz (86.8 kg) IBW/kg (Calculated) : 73  Vital Signs: Temp: 99.3 F (37.4 C) (03/02 0834) Temp Source: Oral (03/02 0834) BP: 123/66 (03/02 0800) Pulse Rate: 86 (03/02 0800)  Labs: Recent Labs    05/17/17 0630 05/17/17 0747 05/17/17 1022 05/17/17 1751 05/17/17 2300 05/18/17 0458 05/18/17 0501 05/18/17 0505  HGB 6.3*  --   --   --  6.9*  --   --  9.5*  HCT 20.9*  --   --   --  22.8*  --   --  31.0*  PLT 409*  --   --   --  346  --   --  316  HEPARINUNFRC  --  0.55 0.46  --   --  <0.10*  --   --   CREATININE  --   --   --  4.18*  --   --  3.94* 3.89*     Assessment: [name] in the ICU after being "found down".Patient has been found to have bilateral lower extremity DVTs with concern for possible PE.  Heparin was held on 3/1 evening due to bleeding around PEG site - per RN report, now resolved. Hgb up to 9.5 s/p 1 unit PRBC yest evening, back down to 7.9 with mid-morning check. Per CCM note today to resume Heparin. Will monitor closely for bleeding.   Goal of Therapy:  Heparin level 0.3-0.7 units/ml Monitor platelets by anticoagulation protocol: Yes   Plan:  Restart Heparin at 1600 units/hr (16 ml/hr) Will continue to monitor for any signs/symptoms of bleeding and will follow up with heparin level in 8 hours   Thank you for allowing pharmacy to be a part of this patient's care.  Georgina PillionElizabeth Aaliah Jorgenson, PharmD, BCPS Clinical Pharmacist Pager: [phone] Clinical phone for 05/18/2017 from 7a-3:30p: [phone] If after 3:30p, please call main pharmacy at: [phone] 05/18/2017 12:00 PM

## 2017-05-18 NOTE — Progress Notes (Signed)
Bleeding noted from PEG site this shift.  TF and heparin stopped, PEG clamped per Dr. Marchelle Gearingamaswamy.  Hb improved from 6.9 to 9.5 with infusion of one unit PRBC's.  Will continue to monitor.

## 2017-05-18 NOTE — Progress Notes (Addendum)
PULMONARY / CRITICAL CARE MEDICINE   Name: Melonie FloridaLarry E Tomkinson MRN: [id number] DOB: 08-25-1950    ADMISSION DATE:  05/01/2017 CONSULTATION DATE:  05/01/17  REFERRING MD:  Dr. Deretha EmoryZackowski  CHIEF COMPLAINT:  Found down  HISTORY OF PRESENT ILLNESS:   67 year old homeless man found down/13 with hypothermia, shock lactic acidosis. Found to have bilateral DVTs and evidence of RV strain on echo He had Acinetobacter in blood and staph in urine with elevated PSA. Unable to be weaned from vent, trach placed 2/27, plans to place PEG 2/28  STUDIES:  ECHO 2/14 - EF 65-70%, G1DD, RV strain Echo 2/21 - EF 55-60%, RV improved  CULTURES: MRSA PCR negative Ucx + staph lugdenensis Blood cx 2/13 >> Diphtheroids + Acinetobacter lwoffii Blood cx 2/19 >>no growth x 5 days Blood cx 2/20 >> no growth x 5 days  ANTIBIOTICS: Zosyn 2/13> 2/18 Vanc 2/13 >>2/19 Meropenem 2/18 >>  SIGNIFICANT EVENTS: 2/13 admitted, intubated 2/22 IVC filter placed 2/25 1u PRBC 2/27 Trach  LINES/TUBES: ETT 2/13 >> 2/27 Trach 2/27 Foley 2/13 PEG 2/28 > Right IJ HD catheter 2/15 >>out  Left IJ CVC 2/15 >>  SUBJECTIVE:  Awake, remains encephalopathic  VITAL SIGNS: BP 134/69   Pulse 82   Temp 99.3 F (37.4 C) (Oral)   Resp 16   Ht 5\' 10"  (1.778 m)   Wt 191 lb 5.8 oz (86.8 kg)   SpO2 99%   BMI 27.46 kg/m   HEMODYNAMICS:    VENTILATOR SETTINGS: Vent Mode: PSV;CPAP FiO2 (%):  [30 %] 30 % Set Rate:  [18 bmp] 18 bmp Vt Set:  [580 mL] 580 mL PEEP:  [5 cmH20] 5 cmH20 Pressure Support:  [5 cmH20] 5 cmH20 Plateau Pressure:  [16 cmH20-18 cmH20] 18 cmH20  INTAKE / OUTPUT:  Intake/Output Summary (Last 24 hours) at 05/18/2017 [id number] Last data filed at 05/18/2017 0600 Gross per 24 hour  Intake 5416 ml  Output 2425 ml  Net 2991 ml     PHYSICAL EXAMINATION: General: This is a 67 year old African-American male he is awake, interactive, no distress HEENT: #6 tracheostomy unremarkable normocephalic mucous membranes  moist Pulmonary: Scattered rhonchi no accessory use looks comfortable on pressure support ventilation of 5 cmH2O Cardiac: Regular rate and rhythm Extremities/musculoskeletal: Warm and dry no edema brisk cap refill Abdomen: Soft nontender tolerating tube feeds Neuro/psych, awake, moving extremities, somewhat interactive.  LABS:  BMET Recent Labs  Lab 05/17/17 1751 05/18/17 0501 05/18/17 0505  NA 155* 156* 154*  K 4.0 4.0 3.9  CL 124* 125* 124*  CO2 20* 20* 20*  BUN 100* 94* 94*  CREATININE 4.18* 3.94* 3.89*  GLUCOSE 148* 138* 136*    Electrolytes Recent Labs  Lab 05/13/17 0430  05/14/17 0439  05/16/17 0440 05/17/17 0500 05/17/17 1751 05/18/17 0501 05/18/17 0505  CALCIUM 7.8*   < > 8.1*   < > 8.1* 7.8* 7.2* 7.7* 7.7*  MG 2.6*  --  2.3  --   --  2.2  --   --   --   PHOS 7.1*   < > 6.8*   < > 6.8* 5.7*  --  5.5*  --    < > = values in this interval not displayed.    CBC Recent Labs  Lab 05/17/17 0630 05/17/17 2300 05/18/17 0505  WBC 15.6* 14.1* 11.4*  HGB 6.3* 6.9* 9.5*  HCT 20.9* 22.8* 31.0*  PLT 409* 346 316    Coag's No results for input(s): APTT, INR in the last 168 hours.  Sepsis Markers Recent Labs  Lab 05/12/17 0301  PROCALCITON 3.65    ABG Recent Labs  Lab 05/14/17 0348 05/17/17 0341  PHART 7.335* 7.401  PCO2ART 36.6 34.2  PO2ART 141* 120.0*    Liver Enzymes Recent Labs  Lab 05/16/17 0440 05/17/17 0500 05/18/17 0501  ALBUMIN 1.5* 1.5* 1.5*    Cardiac Enzymes No results for input(s): TROPONINI, PROBNP in the last 168 hours.  Glucose Recent Labs  Lab 05/17/17 0753 05/17/17 1134 05/17/17 1542 05/17/17 2009 05/17/17 2357 05/18/17 0356  GLUCAP 147* 126* 132* 131* 124* 118*    Imaging No results found. DISCUSSION: Bilateral lower extremity DVT, right heart strain by echocardiogram very suggestive of massive PE.  He had polymicrobial bacteremia, no evidence of valvular disease by echo, not yet speciated.  Also with Staph  lugdunensis UTI (often associated with prostate cancer). PSA elevated. Acinetobacter in blood could be contaminant or related to cellulitis AKI -off  CVVHD.   Encephalopathic still, also with water deficit.  Focus today will be ongoing ventilator weaning, trach collar trials, correction no free water deficit, mobilization, and reestablishing normal sleep-wake cycle.  ASSESSMENT / PLAN:   Acute respiratory failure on ventilator- trach 2/27 Likely pulmonary embolism with associated right heart strain -Chest x-ray reviewed from 3/1 bibasilar atelectasis no clear infiltrate Plan Continue ventilator weaning Treat delirium Mobilize Routine trach care Continuing heparin for pulmonary emboli    Toxic metabolic encephalopathy Agitation Plan Continuing Seroquel and Klonopin Adding melatonin for at bedtime Exline RASS goal 0 Mandatory out of bed daily  Stress related non-ST elevation MI, suspect right heart strain Acute Cor pulmonale Plan Will eventually need ischemia evaluation, can call cardiology back once stabilized for this Continue telemetry monitoring   Acute non-oliguric renal failure; creatinine is improved some Rhabdomyolysis-improving Plan Nephrology following, holding diuretics Trend chemistry Renal dose medications Strict intake and output  Fluid and electrolyte imbalance: Hypernatremia, hypokalemia, metabolic acidosis  plan Free water adjusted Follow-up a.m. chemistry  Additional recommendations per nephrology   Acinetobacter bacteremia. Plan Day #12 of 14 meropenem  Bilateral DVTs S/p IVC filter Anemia; transfused on 3/1 Elevated PSA, Staph lugdunensis both suggestive of possible prostatic CA Plan Continue heparin drip Trend CBC Will eventually need evaluation for prostate cancer as outpatient   hyperglycemia Plan Sliding scale insulin   Bilateral feet with evidence for skin sloughing Plan Continue serial dressing changes Deferring arterial gram  due to renal failure, vascular surgery is following   UTI-  >100,000 colonies staph lugdenenis s/p 7 days of vanc Plan Trend fever curve   DVT prophylaxis: Heparin drip SUP: PPI Diet: tubefeeds Activity: OOB Disposition : ICU  FAMILY  - Updates: No family availabe, being updated by CSW for placement concerns. No changes clinically today.  - Inter-disciplinary family meet or Palliative Care meeting due by:    Simonne Martinet ACNP-BC [facility] Pulmonary/Critical Care Pager # [phone] OR # [phone] if no answer  05/18/2017 8:48 AM  Attending Note:  66 year old male with bilateral lower ext frost bite resulting in necrosis that is in respiratory failure and is now trached.  Patient is off pressors.  On exam, coarse BS diffusely.  I reviewed CXR myself, trach is in good position.  Continue dextrose for another 24 hours.  Restart TF and free water.  BMET in AM for hypernatremia.  Replace electrolytes.  Continue to work on weaning.  PCCM will continue to follow.  The patient is critically ill with multiple organ systems failure and requires high complexity  decision making for assessment and support, frequent evaluation and titration of therapies, application of advanced monitoring technologies and extensive interpretation of multiple databases.   Critical Care Time devoted to patient care services described in this note is  35  Minutes. This time reflects time of care of this signee Dr [name]. This critical care time does not reflect procedure time, or teaching time or supervisory time of PA/NP/Med student/Med Resident etc but could involve care discussion time.  Rush Farmer, M.D. [facility] Pulmonary/Critical Care Medicine. Pager: [phone]. After hours pager: [phone].

## 2017-05-18 NOTE — Progress Notes (Signed)
BLE drg change with silvadine applied generously, guaze overlay and kerlix wrap.

## 2017-05-18 NOTE — Progress Notes (Signed)
Subjective: Interval History: awake and acknowleges.  On vent, trach, transfused..  Objective: Vital signs in last 24 hours: Temp:  [97.9 F (36.6 C)-100.6 F (38.1 C)] 99.3 F (37.4 C) (03/02 0400) Pulse Rate:  [82-124] 85 (03/02 0600) Resp:  [16-31] 21 (03/02 0600) BP: (100-154)/(57-92) 133/75 (03/02 0600) SpO2:  [95 %-100 %] 97 % (03/02 0600) FiO2 (%):  [30 %] 30 % (03/02 0400) Weight:  [86.8 kg (191 lb 5.8 oz)] 86.8 kg (191 lb 5.8 oz) (03/02 0500) Weight change: 2.6 kg (5 lb 11.7 oz)  Intake/Output from previous day: 03/01 0701 - 03/02 0700 In: [id number] [P.O.:745; I.V.:2998; Blood:345; NG/GT:2325; IV Piggyback:100] Out: 2275 [Urine:2175; Stool:100] Intake/Output this shift: No intake/output data recorded.  General appearance: slowed mentation and awake and shakes head to some verbal stim Neck: trach Resp: rales bibasilar Cardio: S1, S2 normal and systolic murmur: systolic ejection 2/6, decrescendo at 2nd left intercostal space GI: PEG , pos bs, soft Extremities: edema 3+  Lab Results: Recent Labs    05/17/17 2300 05/18/17 0505  WBC 14.1* 11.4*  HGB 6.9* 9.5*  HCT 22.8* 31.0*  PLT 346 316   BMET:  Recent Labs    05/18/17 0501 05/18/17 0505  NA 156* 154*  K 4.0 3.9  CL 125* 124*  CO2 20* 20*  GLUCOSE 138* 136*  BUN 94* 94*  CREATININE 3.94* 3.89*  CALCIUM 7.7* 7.7*   No results for input(s): PTH in the last 72 hours. Iron Studies: No results for input(s): IRON, TIBC, TRANSFERRIN, FERRITIN in the last 72 hours.  Studies/Results: Dg Chest Port 1 View  Result Date: 05/17/2017 CLINICAL DATA:  Endotracheal tube EXAM: PORTABLE CHEST 1 VIEW COMPARISON:  05/16/2017 FINDINGS: Tracheostomy tube in place. Left IJ line with tip at the SVC. There is a low volume chest with right more than left streaky opacity. No edema, effusion, or pneumothorax. Normal heart size. IMPRESSION: 1. Remaining hardware in stable position. 2. Stable chest. Low volumes with atelectatic  opacities in the right more than left. Electronically Signed   By: Marnee SpringJonathon  Watts M.D.   On: 05/17/2017 06:50    I have reviewed the patient's current medications.  Assessment/Plan: 1 AKI improving but xs worse.  Need to limit vol, give free water. Hold off diuretics with Kermit Balo^^SNa. 2 ^SNa  Free water loss in stool. 3 DVT/PE anticoag 4 Nutrition TF 5 VDRF per CCM 6 Social situation P D5, free water, TF, vent, merum   LOS: 17 days   Fayrene FearingJames Erek Kowal 05/18/2017,7:01 AM

## 2017-05-19 DIAGNOSIS — R410 Disorientation, unspecified: Secondary | ICD-10-CM

## 2017-05-19 LAB — RENAL FUNCTION PANEL
ALBUMIN: 1.5 g/dL — AB (ref 3.5–5.0)
Anion gap: 7 (ref 5–15)
BUN: 81 mg/dL — AB (ref 6–20)
CALCIUM: 7.8 mg/dL — AB (ref 8.9–10.3)
CO2: 21 mmol/L — ABNORMAL LOW (ref 22–32)
CREATININE: 3.27 mg/dL — AB (ref 0.61–1.24)
Chloride: 126 mmol/L — ABNORMAL HIGH (ref 101–111)
GFR calc Af Amer: 21 mL/min — ABNORMAL LOW (ref >60)
GFR, EST NON AFRICAN AMERICAN: 18 mL/min — AB (ref >60)
Glucose, Bld: 142 mg/dL — ABNORMAL HIGH (ref 65–99)
PHOSPHORUS: 4.6 mg/dL (ref 2.5–4.6)
POTASSIUM: 3.9 mmol/L (ref 3.5–5.1)
SODIUM: 154 mmol/L — AB (ref 135–145)

## 2017-05-19 LAB — CBC
HCT: 27.9 % — ABNORMAL LOW (ref 39.0–52.0)
Hemoglobin: 8.7 g/dL — ABNORMAL LOW (ref 13.0–17.0)
MCH: 29.7 pg (ref 26.0–34.0)
MCHC: 31.2 g/dL (ref 30.0–36.0)
MCV: 95.2 fL (ref 78.0–100.0)
PLATELETS: 359 10*3/uL (ref 150–400)
RBC: 2.93 MIL/uL — ABNORMAL LOW (ref 4.22–5.81)
RDW: 16 % — AB (ref 11.5–15.5)
WBC: 11.2 10*3/uL — AB (ref 4.0–10.5)

## 2017-05-19 LAB — TYPE AND SCREEN
ABO/RH(D): O POS
Antibody Screen: NEGATIVE
UNIT DIVISION: 0
UNIT DIVISION: 0

## 2017-05-19 LAB — GLUCOSE, CAPILLARY
GLUCOSE-CAPILLARY: 117 mg/dL — AB (ref 65–99)
Glucose-Capillary: 99 mg/dL (ref 65–99)
Glucose-Capillary: 99 mg/dL (ref 65–99)
Glucose-Capillary: 114 mg/dL — ABNORMAL HIGH (ref 65–99)
Glucose-Capillary: 119 mg/dL — ABNORMAL HIGH (ref 65–99)

## 2017-05-19 LAB — BPAM RBC
BLOOD PRODUCT EXPIRATION DATE: 201903222359
Blood Product Expiration Date: 201903282359
ISSUE DATE / TIME: 201903011234
ISSUE DATE / TIME: 201903020034
UNIT TYPE AND RH: 5100
Unit Type and Rh: 5100

## 2017-05-19 LAB — HEPARIN LEVEL (UNFRACTIONATED): HEPARIN UNFRACTIONATED: 0.43 [IU]/mL (ref 0.30–0.70)

## 2017-05-19 MED ORDER — PANTOPRAZOLE SODIUM 40 MG PO PACK
40.0000 mg | PACK | Freq: Every day | ORAL | Status: DC
  Administered 2017-05-19 – 2017-06-06 (×19): 40 mg
  Filled 2017-05-19 (×20): qty 20

## 2017-05-19 MED ORDER — QUETIAPINE FUMARATE 50 MG PO TABS
50.0000 mg | ORAL_TABLET | Freq: Every day | ORAL | Status: DC
  Administered 2017-05-19: 50 mg
  Filled 2017-05-19: qty 1

## 2017-05-19 MED ORDER — DEXTROSE 5 % IV SOLN
INTRAVENOUS | Status: DC
  Administered 2017-05-19: 50 mL via INTRAVENOUS
  Administered 2017-05-20: 05:00:00 via INTRAVENOUS

## 2017-05-19 MED ORDER — QUETIAPINE FUMARATE 25 MG PO TABS
25.0000 mg | ORAL_TABLET | Freq: Every day | ORAL | Status: DC
  Administered 2017-05-19: 25 mg
  Filled 2017-05-19 (×2): qty 1

## 2017-05-19 NOTE — Progress Notes (Signed)
ANTICOAGULATION CONSULT NOTE  Pharmacy Consult for Heparin  Indication: chest pain/ACS/DVT/?PE  No Known Allergies  Patient Measurements: Height: 5\' 10"  (177.8 cm) Weight: 187 lb 6.3 oz (85 kg) IBW/kg (Calculated) : 73  Vital Signs: Temp: 98.7 F (37.1 C) (03/03 0800) Temp Source: Oral (03/03 0800) BP: 107/58 (03/03 0800) Pulse Rate: 84 (03/03 0816)  Labs: Recent Labs    05/18/17 0458 05/18/17 0501 05/18/17 0505 05/18/17 1050 05/18/17 2309 05/19/17 0455  HGB  --   --  9.5* 7.9*  --  8.7*  HCT  --   --  31.0* 25.5*  --  27.9*  PLT  --   --  316 352  --  359  HEPARINUNFRC <0.10*  --   --   --  0.34 0.43  CREATININE  --  3.94* 3.89*  --   --  3.27*     Assessment: [name] in the ICU after being "found down".Patient has been found to have bilateral lower extremity DVTs with concern for possible PE. Heparin drip held 3/1 evening due to bleeding around PEG site - resumed on 3/2 as this was resolved.   Heparin level this morning remains therapeutic (HL 0.43, goal of 0.3-0.7). Hgb/Hct/Plt trending up - no further bleeding around PEG site noted per RN report.   Goal of Therapy:  Heparin level 0.3-0.7 units/ml Monitor platelets by anticoagulation protocol: Yes   Plan:  Continue Heparin at 1600 units/hr (16 ml/hr) Will monitor for bleeding around PEG site Will continue to monitor for any signs/symptoms of bleeding and will follow up with heparin level in the a.m.   Thank you for allowing pharmacy to be a part of this patient's care.  Georgina PillionElizabeth Karlee Staff, PharmD, BCPS Clinical Pharmacist Pager: [phone] Clinical phone for 05/19/2017 from 7a-3:30p: [phone] If after 3:30p, please call main pharmacy at: [phone] 05/19/2017 11:20 AM

## 2017-05-19 NOTE — Evaluation (Signed)
Physical Therapy Evaluation Patient Details Name: Joseph Downs MRN: [id number] DOB: [birth year] Today's Date: 05/19/2017   History of Present Illness  This is a 67 year old man who was found down, unresponsive and hypothermic; Critical care service is asked to admit the patient because he still had a lactate acid in excess of 6 after being warmed and fluid resuscitated. Resp insufficiency, and intubated 2/13; Encephalopathy, NSTEMI, Acute Renal Failure; Bil DVTs, massive PE; shock, Bacteremia; IVC filter placed 2/22; Extubated 2/26, re-intubated later that day; received trach 2/27; received PEG 2/28; tolerated trach off vent most of 3/2  Clinical Impression   Pt admitted with above diagnosis. Pt currently with functional limitations due to the deficits listed below (see PT Problem List).   Presents with functional dependencies as outlined below; At this point, we are proceeding NWB bil LEs because of frostbite, but there is no formal order for non-weight-bearing;  Will be focused on lateral scooting for functional transfers;   Vascular: can he weight bear through feet in CAM boot or Darco shoe?  If we get the go-ahead for weight bearing will initiate work on standing, and using LEs more with transfers, and possibly initiate gait training;   Pt will benefit from skilled PT to increase their independence and safety with mobility to allow discharge to the venue listed below.       Follow Up Recommendations SNF;LTACH;Other (comment)(Noted SW's efforts in communcating with [facility]-Thanks!)    Equipment Recommendations  Other (comment)(to be determined)    Recommendations for Other Services       Precautions / Restrictions Precautions Precautions: Fall Required Braces or Orthoses: Other Brace/Splint Other Brace/Splint: Worth considering asking Vascular or Ortho to weigh in re: acceptable shoe (Darco? Postop shoe? PRAFO?) Restrictions Other Position/Activity Restrictions: Frostbite bilateral  feet; per RN today, NWB, and will proceed NWB, unless otherwise ordered      Mobility  Bed Mobility               General bed mobility comments: OOB to recliner via Maximove with nursing  Transfers                    Ambulation/Gait                Stairs            Wheelchair Mobility    Modified Rankin (Stroke Patients Only)       Balance Overall balance assessment: Needs assistance Sitting-balance support: Single extremity supported;Bilateral upper extremity supported;Feet supported Sitting balance-Leahy Scale: Zero Sitting balance - Comments: Assessed pt in recliner; in addition, we worked on pulling from supported sitting to unsupported sitting in recliner; Able to place hands on armrests and pull; Currently needing Max assist to pull forward to unsupported sitting and maintian sitting balance unsupported Postural control: Posterior lean;Left lateral lean                                   Pertinent Vitals/Pain Pain Assessment: No/denies pain Faces Pain Scale: Hurts little more Pain Location: Did not specify, denies pain (shakes head "no") when asked, but noted grimacing with movement Pain Descriptors / Indicators: Grimacing Pain Intervention(s): Monitored during session    Home Living Family/patient expects to be discharged to:: Unsure                 Additional Comments: Tough situation; pt occasionally is back and forth with his ex-wife;  sibling (s?) live in [place]    Prior Function Level of Independence: Independent               Hand Dominance        Extremity/Trunk Assessment   Upper Extremity Assessment Upper Extremity Assessment: Defer to OT evaluation RUE Deficits / Details: Weak grip; AAROM into shoulder flexion present, limited range and weak LUE Deficits / Details: Weak grip; AAROM into shoulder flexion present, limited range and weak    Lower Extremity Assessment Lower Extremity  Assessment: RLE deficits/detail;LLE deficits/detail RLE Deficits / Details: Significant weakness throughout; Able to activate quad against gravity; Noted toes with signs of frostbite LLE Deficits / Details: Significant weakness throughout; Able to activate quad against gravity; Noted toes with signs of frostbite    Cervical / Trunk Assessment Cervical / Trunk Assessment: Kyphotic  Communication   Communication: Tracheostomy  Cognition Arousal/Alertness: Awake/alert Behavior During Therapy: WFL for tasks assessed/performed Overall Cognitive Status: Difficult to assess                                 General Comments: Pt showed every effort to follow commands during session      General Comments General comments (skin integrity, edema, etc.): VSS with trach on supplemental O2 throughout session    Exercises     Assessment/Plan    PT Assessment Patient needs continued PT services  PT Problem List Decreased strength;Decreased range of motion;Decreased activity tolerance;Decreased balance;Decreased mobility;Decreased coordination;Decreased cognition;Decreased knowledge of use of DME;Decreased safety awareness;Decreased knowledge of precautions;Cardiopulmonary status limiting activity;Pain;Decreased skin integrity;Impaired sensation       PT Treatment Interventions DME instruction;Functional mobility training;Therapeutic activities;Therapeutic exercise;Balance training;Cognitive remediation;Patient/family education;Wheelchair mobility training;Gait training(will work on gait once cleared to bear weight on feet)    PT Goals (Current goals can be found in the Care Plan section)  Acute Rehab PT Goals Patient Stated Goal: unable to state PT Goal Formulation: Patient unable to participate in goal setting Time For Goal Achievement: 06/02/17 Potential to Achieve Goals: Fair    Frequency Min 2X/week   Barriers to discharge        Co-evaluation                AM-PAC PT "6 Clicks" Daily Activity  Outcome Measure Difficulty turning over in bed (including adjusting bedclothes, sheets and blankets)?: Unable Difficulty moving from lying on back to sitting on the side of the bed? : Unable Difficulty sitting down on and standing up from a chair with arms (e.g., wheelchair, bedside commode, etc,.)?: Unable Help needed moving to and from a bed to chair (including a wheelchair)?: Total Help needed walking in hospital room?: Total Help needed climbing 3-5 steps with a railing? : Total 6 Click Score: 6    End of Session   Activity Tolerance: Patient limited by fatigue Patient left: in chair;with call bell/phone within reach Nurse Communication: Mobility status PT Visit Diagnosis: Muscle weakness (generalized) (M62.81);Difficulty in walking, not elsewhere classified (R26.2);Adult, failure to thrive (R62.7)    Time: [id number] PT Time Calculation (min) (ACUTE ONLY): 12 min   Charges:   PT Evaluation $PT Eval High Complexity: 1 High     PT G Codes:        Van ClinesHolly Jamal Haskin, PT  Acute Rehabilitation Services Pager [phone] Office [phone]   Levi AlandHolly H Drianna Chandran 05/19/2017, 3:49 PM

## 2017-05-19 NOTE — Progress Notes (Signed)
Subjective: Interval History: Did trach collar most of yest. Objective: Vital signs in last 24 hours: Temp:  [97.9 F (36.6 C)-99.9 F (37.7 C)] 99.9 F (37.7 C) (03/03 0416) Pulse Rate:  [70-95] 86 (03/03 0600) Resp:  [15-30] 23 (03/03 0600) BP: (92-143)/(36-80) 112/68 (03/03 0600) SpO2:  [96 %-100 %] 100 % (03/03 0600) FiO2 (%):  [30 %-40 %] 30 % (03/03 0403) Weight:  [85 kg (187 lb 6.3 oz)] 85 kg (187 lb 6.3 oz) (03/03 0458) Weight change: -1.8 kg (-15.5 oz)  Intake/Output from previous day: 03/02 0701 - 03/03 0700 In: 3215 [I.V.:1140; UJ/WJ:1914G/GT:1975; IV Piggyback:100] Out: 3075 [Urine:3075] Intake/Output this shift: No intake/output data recorded.  General appearance: cooperative, slowed mentation and obeys some commands, lethargic, on trach, vent  Neck: trach Resp: rales bibasilar and rhonchi bibasilar Cardio: S1, S2 normal and systolic murmur: holosystolic 2/6, blowing at apex GI: PEG, pos bs, soft Extremities: edema 2-3+  Lab Results: Recent Labs    05/18/17 1050 05/19/17 0455  WBC 12.0* 11.2*  HGB 7.9* 8.7*  HCT 25.5* 27.9*  PLT 352 359   BMET:  Recent Labs    05/18/17 0505 05/19/17 0455  NA 154* 154*  K 3.9 3.9  CL 124* 126*  CO2 20* 21*  GLUCOSE 136* 142*  BUN 94* 81*  CREATININE 3.89* 3.27*  CALCIUM 7.7* 7.8*   No results for input(s): PTH in the last 72 hours. Iron Studies: No results for input(s): IRON, TIBC, TRANSFERRIN, FERRITIN in the last 72 hours.  Studies/Results: No results found.  I have reviewed the patient's current medications.  Assessment/Plan: 1 AKI improving , nonoliguric  Mild acidemia, acceptable on bicarb.  Slowly getting rid of vol wgt slowly coming down . Still vol xs 2 ^ SNa, stable, use D5 and free water.  D losing free water. Hypervolemic hypernatremia 3 PE anticoag 4 Anemia stable 5 Nutrition TF 6 Resp doing some trach collar! 7 Social situation P D5, limit other vol.  Resp support, TF    LOS: 18 days   Joseph Downs 05/19/2017,7:37 AM

## 2017-05-19 NOTE — Progress Notes (Signed)
ANTICOAGULATION CONSULT NOTE  Pharmacy Consult for Heparin  Indication: chest pain/ACS/DVT/?PE  No Known Allergies  Patient Measurements: Height: 5\' 10"  (177.8 cm) Weight: 191 lb 5.8 oz (86.8 kg) IBW/kg (Calculated) : 73  Vital Signs: Temp: 98.9 F (37.2 C) (03/02 2350) Temp Source: Oral (03/02 2350) BP: 109/64 (03/02 2321) Pulse Rate: 88 (03/02 2200)  Labs: Recent Labs    05/17/17 1022 05/17/17 1751 05/17/17 2300 05/18/17 0458 05/18/17 0501 05/18/17 0505 05/18/17 1050 05/18/17 2309  HGB  --   --  6.9*  --   --  9.5* 7.9*  --   HCT  --   --  22.8*  --   --  31.0* 25.5*  --   PLT  --   --  346  --   --  316 352  --   HEPARINUNFRC 0.46  --   --  <0.10*  --   --   --  0.34  CREATININE  --  4.18*  --   --  3.94* 3.89*  --   --      Assessment: [name] in the ICU after being "found down".Patient has been found to have bilateral lower extremity DVTs with concern for possible PE.  Heparin was held on 3/1 evening due to bleeding around PEG site - per RN report, now resolved. Hgb up to 9.5 s/p 1 unit PRBC yest evening, back down to 7.9 with mid-morning check. Per CCM note today to resume Heparin. Will monitor closely for bleeding.   Update 3/3 AM: heparin level therapeutic x 1 after re-start  Goal of Therapy:  Heparin level 0.3-0.7 units/ml Monitor platelets by anticoagulation protocol: Yes   Plan:  Cont heparin at 1600 units/hr Confirmatory heparin level with AM labs  Abran DukeJames Jeovanny Cuadros, PharmD, BCPS Clinical Pharmacist Phone: [phone]

## 2017-05-19 NOTE — Progress Notes (Addendum)
PULMONARY / CRITICAL CARE MEDICINE   Name: Joseph Downs MRN: [id number] DOB: [birth year]    ADMISSION DATE:  05/01/2017 CONSULTATION DATE:  05/01/17  REFERRING MD:  Dr. Deretha Emory  CHIEF COMPLAINT:  Found down  HISTORY OF PRESENT ILLNESS:   67 year old homeless man found down/13 with hypothermia, shock lactic acidosis. Found to have bilateral DVTs and evidence of RV strain on echo He had Acinetobacter in blood and staph in urine with elevated PSA. Unable to be weaned from vent, trach placed 2/27, plans to place PEG 2/28  STUDIES:  ECHO 2/14 - EF 65-70%, G1DD, RV strain Echo 2/21 - EF 55-60%, RV improved  CULTURES: MRSA PCR negative Ucx + staph lugdenensis Blood cx 2/13 >> Diphtheroids + Acinetobacter lwoffii Blood cx 2/19 >>no growth x 5 days Blood cx 2/20 >> no growth x 5 days  ANTIBIOTICS: Zosyn 2/13> 2/18 Vanc 2/13 >>2/19 Meropenem 2/18 >>  SIGNIFICANT EVENTS: 2/13 admitted, intubated 2/22 IVC filter placed 2/25 1u PRBC 2/27 Trach  LINES/TUBES: ETT 2/13 >> 2/27 Trach 2/27 Foley 2/13 PEG 2/28 > Right IJ HD catheter 2/15 >>out  Left IJ CVC 2/15 >>  SUBJECTIVE:  Continues to slowly improve  VITAL SIGNS: BP (!) 107/58   Pulse 84   Temp 98.7 F (37.1 C) (Oral)   Resp (!) 25   Ht 5\' 10"  (1.778 m)   Wt 187 lb 6.3 oz (85 kg)   SpO2 100%   BMI 26.89 kg/m   HEMODYNAMICS:    VENTILATOR SETTINGS: Vent Mode: PSV;CPAP FiO2 (%):  [30 %-40 %] 40 % Set Rate:  [18 bmp] 18 bmp Vt Set:  [580 mL] 580 mL PEEP:  [5 cmH20] 5 cmH20 Pressure Support:  [5 cmH20] 5 cmH20 Plateau Pressure:  [13 cmH20-17 cmH20] 13 cmH20  INTAKE / OUTPUT:  Intake/Output Summary (Last 24 hours) at 05/19/2017 7846 Last data filed at 05/19/2017 0900 Gross per 24 hour  Intake 3315 ml  Output 3075 ml  Net 240 ml     PHYSICAL EXAMINATION: General: This is 67 year old African-American male is resting comfortably in the bed.  He is currently on trach collar with no acute distress. HEENT:  #6 cuffed tracheostomy currently cuff inflated.  Minimal tracheal discharge.  He does have temporal wasting, and his mucous membranes are somewhat dry.  Next line pulmonary: Scattered rhonchi no accessory use appears comfortable on trach collar. Cardiac: Regular rate and rhythm Abdomen: Soft nontender tolerating tube feeds Extremities/musculoskeletal: Has marked lower extremity edema, warm, generalized weakness. Neuro/psych: More awake, following commands, more appropriate. LABS:  BMET Recent Labs  Lab 05/18/17 0501 05/18/17 0505 05/19/17 0455  NA 156* 154* 154*  K 4.0 3.9 3.9  CL 125* 124* 126*  CO2 20* 20* 21*  BUN 94* 94* 81*  CREATININE 3.94* 3.89* 3.27*  GLUCOSE 138* 136* 142*    Electrolytes Recent Labs  Lab 05/13/17 0430  05/14/17 0439  05/17/17 0500  05/18/17 0501 05/18/17 0505 05/19/17 0455  CALCIUM 7.8*   < > 8.1*   < > 7.8*   < > 7.7* 7.7* 7.8*  MG 2.6*  --  2.3  --  2.2  --   --   --   --   PHOS 7.1*   < > 6.8*   < > 5.7*  --  5.5*  --  4.6   < > = values in this interval not displayed.    CBC Recent Labs  Lab 05/18/17 0505 05/18/17 1050 05/19/17 0455  WBC 11.4* 12.0* 11.2*  HGB 9.5* 7.9* 8.7*  HCT 31.0* 25.5* 27.9*  PLT 316 352 359    Coag's No results for input(s): APTT, INR in the last 168 hours.  Sepsis Markers No results for input(s): LATICACIDVEN, PROCALCITON, O2SATVEN in the last 168 hours.  ABG Recent Labs  Lab 05/14/17 0348 05/17/17 0341  PHART 7.335* 7.401  PCO2ART 36.6 34.2  PO2ART 141* 120.0*    Liver Enzymes Recent Labs  Lab 05/17/17 0500 05/18/17 0501 05/19/17 0455  ALBUMIN 1.5* 1.5* 1.5*    Cardiac Enzymes No results for input(s): TROPONINI, PROBNP in the last 168 hours.  Glucose Recent Labs  Lab 05/18/17 1159 05/18/17 1607 05/18/17 2009 05/18/17 2346 05/19/17 0414 05/19/17 0808  GLUCAP 117* 93 106* 120* 99 99    Imaging No results found. DISCUSSION: Bilateral lower extremity DVT, right heart strain  by echocardiogram very suggestive of massive PE.  He had polymicrobial bacteremia, no evidence of valvular disease by echo, not yet speciated.  Also with Staph lugdunensis UTI (often associated with prostate cancer). PSA elevated. Acinetobacter in blood could be contaminant or related to cellulitis AKI -off  CVVHD.   Encephalopathic still, also with water deficit.  He looks better today.  His encephalopathy continues to improve.  He still profoundly hyponatremic, free water is been adjusted once again.  We will continue once again for with her focus on aggressive therapy in regards to his delirium.  Specifically weaning Seroquel today, continuing to get him out of bed, also asking speech therapy to see him.  I am hopeful initiation of Passy-Muir valve trials will help with communication  ASSESSMENT / PLAN:   Acute respiratory failure on ventilator- trach 2/27 Likely pulmonary embolism with associated right heart strain -Chest x-ray reviewed from 3/1 bibasilar atelectasis no clear infiltrate Plan Trach collar as tolerated, will change ventilator settings to as needed Continue aggressive delirium therapy Mobilize Routine tracheostomy care Continue heparin infusion  Toxic metabolic encephalopathy Agitation Seems to be slowly improving. Plan Continuing clonazepam at 0.5 mg a day and 1.0 mg at at bedtime Continuing Seroquel 25 mg during the day and 50 at at bedtime.   Next step will be to wean clonazepam down to 0.5 mg at at bedtime only Continue at bedtime melatonin Continue mandatory out of bed daily We will ask speech therapy to see him today, to facilitate Passy-Muir valve trials   Stress related non-ST elevation MI, suspect right heart strain Acute Cor pulmonale Plan Eventually need ischemia eval Call cardiology back prior to discharge  Acute non-oliguric renal failure; creatinine is improved some Rhabdomyolysis-improving Plan Nephrology following Added D5 water Holding  diuretics  Fluid and electrolyte imbalance: Hypernatremia, hypokalemia, metabolic acidosis  plan Free water adjusted per nephrology Follow-up a.m. chemistry  Acinetobacter bacteremia. Plan Day # 13/14 meropenem   Bilateral DVTs S/p IVC filter Anemia; transfused on 3/1 Elevated PSA, Staph lugdunensis both suggestive of possible prostatic CA Plan Continue heparin drip Trend CBC Eventually needs prostate cancer evaluation    hyperglycemia Plan Sliding scale insulin   Bilateral feet with evidence for skin sloughing Plan Continue serial dressing changes Deferring arteriogram Vascular surgery following    UTI-  >100,000 colonies staph lugdenenis s/p 7 days of vanc Plan Trend fever curve   DVT prophylaxis: Heparin drip SUP: PPI Diet: tubefeeds Activity: OOB Disposition : ICU  FAMILY  - Updates: No family availabe, being updated by CSW for placement concerns. No changes clinically today.  - Inter-disciplinary family meet or Palliative Care meeting due by:  Simonne Martinet ACNP-BC [facility] Pulmonary/Critical Care Pager # [phone] OR # [phone] if no answer  05/19/2017 9:26 AM  Attending Note:  67 year old male with ischemic legs due to frostbite who developed sepsis and respiratory failure.  Now is trached.  On exam, coarse BS diffusely.  I reviewed CXR myself, trach is in good position.  Discussed with PCCM-NP.  Will continue weaning efforts.  Will advance to TC if able.  Delirium regiment addressed as above.  Continue abx for now.  Full vent support at night.  PCCM will continue to follow.  The patient is critically ill with multiple organ systems failure and requires high complexity decision making for assessment and support, frequent evaluation and titration of therapies, application of advanced monitoring technologies and extensive interpretation of multiple databases.   Critical Care Time devoted to patient care services described in this note is  35  Minutes.  This time reflects time of care of this signee Dr Koren Bound. This critical care time does not reflect procedure time, or teaching time or supervisory time of PA/NP/Med student/Med Resident etc but could involve care discussion time.  Alyson Reedy, M.D. [facility] Pulmonary/Critical Care Medicine. Pager: [phone]. After hours pager: [phone].

## 2017-05-20 LAB — GLUCOSE, CAPILLARY
GLUCOSE-CAPILLARY: 117 mg/dL — AB (ref 65–99)
GLUCOSE-CAPILLARY: 118 mg/dL — AB (ref 65–99)
Glucose-Capillary: 106 mg/dL — ABNORMAL HIGH (ref 65–99)
Glucose-Capillary: 125 mg/dL — ABNORMAL HIGH (ref 65–99)
Glucose-Capillary: 125 mg/dL — ABNORMAL HIGH (ref 65–99)
Glucose-Capillary: 127 mg/dL — ABNORMAL HIGH (ref 65–99)
Glucose-Capillary: 101 mg/dL — ABNORMAL HIGH (ref 65–99)

## 2017-05-20 LAB — CULTURE, BLOOD (ROUTINE X 2): Special Requests: ADEQUATE

## 2017-05-20 LAB — RENAL FUNCTION PANEL
ALBUMIN: 1.4 g/dL — AB (ref 3.5–5.0)
Anion gap: 9 (ref 5–15)
BUN: 70 mg/dL — AB (ref 6–20)
CO2: 20 mmol/L — ABNORMAL LOW (ref 22–32)
CREATININE: 2.76 mg/dL — AB (ref 0.61–1.24)
Calcium: 7.7 mg/dL — ABNORMAL LOW (ref 8.9–10.3)
Chloride: 125 mmol/L — ABNORMAL HIGH (ref 101–111)
GFR calc Af Amer: 26 mL/min — ABNORMAL LOW (ref >60)
GFR calc non Af Amer: 22 mL/min — ABNORMAL LOW (ref >60)
GLUCOSE: 134 mg/dL — AB (ref 65–99)
PHOSPHORUS: 4.1 mg/dL (ref 2.5–4.6)
Potassium: 4 mmol/L (ref 3.5–5.1)
SODIUM: 154 mmol/L — AB (ref 135–145)

## 2017-05-20 LAB — CBC
HCT: 25.4 % — ABNORMAL LOW (ref 39.0–52.0)
Hemoglobin: 7.8 g/dL — ABNORMAL LOW (ref 13.0–17.0)
MCH: 29.5 pg (ref 26.0–34.0)
MCHC: 30.7 g/dL (ref 30.0–36.0)
MCV: 96.2 fL (ref 78.0–100.0)
PLATELETS: 334 10*3/uL (ref 150–400)
RBC: 2.64 MIL/uL — ABNORMAL LOW (ref 4.22–5.81)
RDW: 15.6 % — AB (ref 11.5–15.5)
WBC: 9.6 10*3/uL (ref 4.0–10.5)

## 2017-05-20 LAB — SUSCEPTIBILITY, AER + ANAEROB

## 2017-05-20 LAB — HEPARIN LEVEL (UNFRACTIONATED): Heparin Unfractionated: 0.51 IU/mL (ref 0.30–0.70)

## 2017-05-20 MED ORDER — HALOPERIDOL 1 MG PO TABS
1.0000 mg | ORAL_TABLET | Freq: Every day | ORAL | Status: DC
  Administered 2017-05-20 – 2017-06-13 (×23): 1 mg via ORAL
  Filled 2017-05-20 (×7): qty 1
  Filled 2017-05-20: qty 2
  Filled 2017-05-20 (×18): qty 1

## 2017-05-20 MED ORDER — VITAMIN B-1 100 MG PO TABS
100.0000 mg | ORAL_TABLET | Freq: Every day | ORAL | Status: DC
  Administered 2017-05-20 – 2017-06-13 (×24): 100 mg
  Filled 2017-05-20 (×24): qty 1

## 2017-05-20 MED ORDER — LORAZEPAM 2 MG/ML IJ SOLN
1.0000 mg | Freq: Once | INTRAMUSCULAR | Status: AC
  Administered 2017-05-20: 1 mg via INTRAVENOUS
  Filled 2017-05-20: qty 1

## 2017-05-20 MED ORDER — HALOPERIDOL 0.5 MG PO TABS
2.0000 mg | ORAL_TABLET | Freq: Every day | ORAL | Status: DC
  Administered 2017-05-20 – 2017-06-12 (×24): 2 mg via ORAL
  Filled 2017-05-20: qty 2
  Filled 2017-05-20: qty 1
  Filled 2017-05-20: qty 2
  Filled 2017-05-20 (×8): qty 1
  Filled 2017-05-20: qty 2
  Filled 2017-05-20 (×2): qty 1
  Filled 2017-05-20: qty 2
  Filled 2017-05-20 (×3): qty 1
  Filled 2017-05-20 (×2): qty 2
  Filled 2017-05-20: qty 4
  Filled 2017-05-20 (×4): qty 1
  Filled 2017-05-20: qty 2
  Filled 2017-05-20 (×3): qty 1

## 2017-05-20 NOTE — Evaluation (Signed)
Passy-Muir Speaking Valve - Evaluation Patient Details  Name: Joseph FloridaLarry E Minella MRN: [id number] Date of Birth: [birth year]  Today's Date: 05/20/2017 Time: [id number] SLP Time Calculation (min) (ACUTE ONLY): 18 min  Past Medical History: No past medical history on file. Past Surgical History:  Past Surgical History:  Procedure Laterality Date  . ESOPHAGOGASTRODUODENOSCOPY N/A 05/16/2017   Procedure: ESOPHAGOGASTRODUODENOSCOPY (EGD);  Surgeon: Violeta Gelinashompson, Burke, MD;  [facility];  Service: Endoscopy;  Laterality: N/A;  . IR IVC FILTER PLMT / S&I Lenise Arena/IMG GUID/MOD SED  05/10/2017  . PEG PLACEMENT N/A 05/16/2017   Procedure: PERCUTANEOUS ENDOSCOPIC GASTROSTOMY (PEG) REPLACEMENT;  Surgeon: Violeta Gelinashompson, Burke, MD;  [facility];  Service: Endoscopy;  Laterality: N/A;   HPI:  67 year old homeless man found down/67 with hypothermia, shock lactic acidosis. 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 HehadAcinetobacter in blood and staph in urine with elevated PSA. Unable to be weaned from vent (ETT 2/13-2/27), trach placed 2/27, PEG 2/28.    Assessment / Plan / Recommendation Clinical Impression  PMV evaluation complete. Patient lethargic but arousable, eyes open however requires max cueing for verbal output, producing only 3-4 utterances with valve in place. Able to tolerate without change in vital signs, intermittent dry coughing noted. One episodes of questionable air trapping noted following 3-4 minutes of wear however not noted again during evaluation, maybe restoration of PEEP. Recommend PMV use with SLP only given cognitive limitation of evaluation. Note that patient swallowing saliva consistently during evaluation, may be appropriate for swallow evaluation as early as 3/5 if mentation continues to improve.  SLP Visit Diagnosis: Aphonia (R49.1)    SLP Assessment  Patient needs continued Speech Lanaguage Pathology Services  PMSV with SLP supervision only   Follow  Up Recommendations  (TBD)    Frequency and Duration min 2x/week  2 weeks    PMSV Trial PMSV was placed for: 15 minutes Able to redirect subglottic air through upper airway: Yes Able to Attain Phonation: Yes Voice Quality: Low vocal intensity Able to Expectorate Secretions: Yes Level of Secretion Expectoration with PMSV: Oral(minimal) Breath Support for Phonation: Adequate Intelligibility: Intelligible(minimal verbal output) Respirations During Trial: 22 SpO2 During Trial: 100 % Pulse During Trial: 82 Behavior: Lethargic   Tracheostomy Tube  Additional Tracheostomy Tube Assessment Fenestrated: No Secretion Description: mod Level of Secretion Expectoration: Tracheal    Vent Dependency  Vent Dependent: No FiO2 (%): 35 %    Cuff Deflation Trial  GO Tolerated Cuff Deflation: Yes Length of Time for Cuff Deflation Trial: 3 minutes Behavior: Cooperative(lethargic)   Ferdinand LangoLeah Kaire Stary MA, CCC-SLP [phone]        Percilla Tweten [name] 05/20/2017, 4:34 PM

## 2017-05-20 NOTE — Progress Notes (Signed)
   05/20/17 1100  Clinical Encounter Type  Visited With Patient and family together  Visit Type Initial  Referral From Chaplain  Consult/Referral To Chaplain  Spiritual Encounters  Spiritual Needs Emotional  Stress Factors  Patient Stress Factors Exhausted  Family Stress Factors None identified   Pt was sitting in the chair seemingly very restless. Two family members were on-site. Chaplain Nd family members were very talkative about christian faith as a sustenance to the Pt's recovery. Chaplain provided emotional support through reflective listening and compassionate presence.  Jaye Saal a Water quality scientistMusiko-Holley, E. I. du PontChaplain

## 2017-05-20 NOTE — Progress Notes (Addendum)
PULMONARY / CRITICAL CARE MEDICINE   Name: Joseph Downs MRN: [id number] DOB: 1951/02/28    ADMISSION DATE:  05/01/2017 CONSULTATION DATE:  05/01/17  REFERRING MD:  Dr. Deretha EmoryZackowski  CHIEF COMPLAINT:  Found down  HISTORY OF PRESENT ILLNESS:   67 year old homeless man found down/13 with hypothermia, shock lactic acidosis. Found to have bilateral DVTs and evidence of RV strain on echo He had Acinetobacter in blood and staph in urine with elevated PSA. Unable to be weaned from vent, trach placed 2/27, plans to place PEG 2/28  STUDIES:  ECHO 2/14 - EF 65-70%, G1DD, RV strain Echo 2/21 - EF 55-60%, RV improved  CULTURES: MRSA PCR negative Ucx + staph lugdenensis Blood cx 2/13 >> Diphtheroids + Acinetobacter lwoffii Blood cx 2/19 >>no growth x 5 days Blood cx 2/20 >> no growth x 5 days  ANTIBIOTICS: Zosyn 2/13> 2/18 Vanc 2/13 >>2/19 Meropenem 2/18 >>  SIGNIFICANT EVENTS: 2/13 admitted, intubated 2/22 IVC filter placed 2/25 1u PRBC 2/27 Trach  LINES/TUBES: ETT 2/13 >> 2/27 Trach 2/27 Foley 2/13 PEG 2/28 > Right IJ HD catheter 2/15 >>out  Left IJ CVC 2/15 >>  SUBJECTIVE:  Continues to slowly improve  VITAL SIGNS: BP (!) 144/80   Pulse 93   Temp 99.3 F (37.4 C) (Oral)   Resp (!) 28   Ht 5\' 10"  (1.778 m)   Wt 193 lb 2 oz (87.6 kg)   SpO2 100%   BMI 27.71 kg/m   HEMODYNAMICS:    VENTILATOR SETTINGS: Vent Mode: PSV;CPAP FiO2 (%):  [30 %-40 %] 35 % Set Rate:  [18 bmp] 18 bmp Vt Set:  [580 mL] 580 mL PEEP:  [5 cmH20] 5 cmH20 Pressure Support:  [5 cmH20] 5 cmH20 Plateau Pressure:  [13 cmH20-17 cmH20] 16 cmH20  INTAKE / OUTPUT:  Intake/Output Summary (Last 24 hours) at 05/20/2017 1034 Last data filed at 05/20/2017 0600 Gross per 24 hour  Intake 3470 ml  Output 1850 ml  Net 1620 ml     PHYSICAL EXAMINATION:  General: This is a acute on chronically ill-appearing 67 year old African-American male currently up in the chair on aerosol trach collar he is in  no acute distress HEENT: He has a #6 cuffed tracheostomy, otherwise normocephalic Pulmonary: Scattered rhonchi without accessory use Cardiac: Regular rate and rhythm Abdomen: Soft nontender tolerating tube feeds has a PEG tube. Neuro/psych: Awake, interactive, tries to communicate, moves all extremities, has generalized lower extremity weakness.  He remains somewhat encephalopathic however. Extremities/musculoskeletal: Lower extremity edema mains present, otherwise warm and intact, has generalized weakness.  Lower is particularly weak. LABS:  BMET Recent Labs  Lab 05/18/17 0505 05/19/17 0455 05/20/17 0500  NA 154* 154* 154*  K 3.9 3.9 4.0  CL 124* 126* 125*  CO2 20* 21* 20*  BUN 94* 81* 70*  CREATININE 3.89* 3.27* 2.76*  GLUCOSE 136* 142* 134*    Electrolytes Recent Labs  Lab 05/14/17 0439  05/17/17 0500  05/18/17 0501 05/18/17 0505 05/19/17 0455 05/20/17 0500  CALCIUM 8.1*   < > 7.8*   < > 7.7* 7.7* 7.8* 7.7*  MG 2.3  --  2.2  --   --   --   --   --   PHOS 6.8*   < > 5.7*  --  5.5*  --  4.6 4.1   < > = values in this interval not displayed.    CBC Recent Labs  Lab 05/18/17 1050 05/19/17 0455 05/20/17 0500  WBC 12.0* 11.2* 9.6  HGB 7.9*  8.7* 7.8*  HCT 25.5* 27.9* 25.4*  PLT 352 359 334    Coag's No results for input(s): APTT, INR in the last 168 hours.  Sepsis Markers No results for input(s): LATICACIDVEN, PROCALCITON, O2SATVEN in the last 168 hours.  ABG Recent Labs  Lab 05/14/17 0348 05/17/17 0341  PHART 7.335* 7.401  PCO2ART 36.6 34.2  PO2ART 141* 120.0*    Liver Enzymes Recent Labs  Lab 05/18/17 0501 05/19/17 0455 05/20/17 0500  ALBUMIN 1.5* 1.5* 1.4*    Cardiac Enzymes No results for input(s): TROPONINI, PROBNP in the last 168 hours.  Glucose Recent Labs  Lab 05/19/17 1205 05/19/17 1707 05/19/17 1959 05/19/17 2345 05/20/17 0428 05/20/17 0752  GLUCAP 114* 119* 117* 125* 106* 125*    Imaging No results found.  ASSESSMENT  / PLAN:   Acute respiratory failure on ventilator- trach 2/27 Likely pulmonary embolism with associated right heart strain Toxic metabolic encephalopathy Agitation Stress related non-ST elevation MI, suspect right heart strain Acute Cor pulmonale Acute non-oliguric renal failure; creatinine is improved some Rhabdomyolysis Fluid and electrolyte imbalance: Hypernatremia, hypokalemia, metabolic acidosis  Acinetobacter bacteremia. Bilateral DVTs S/p IVC filter Anemia; transfused on 3/1 Elevated PSA, Staph lugdunensis both suggestive of possible prostatic CA  hyperglycemia Bilateral feet with evidence for skin sloughing UTI   DISCUSSION: Bilateral lower extremity DVT, right heart strain by echocardiogram very suggestive of massive PE.  He had polymicrobial bacteremia, no evidence of valvular disease by echo, not yet speciated.  Also with Staph lugdunensis UTI (often associated with prostate cancer). PSA elevated. Acinetobacter in blood could be contaminant or related to cellulitis AKI -off  CVVHD.   -looking good. Still encephalopathic but slowly improving  -looks comfortable on ATC.   Plan Cont ATC as tolerated w/ PRN ventilation  Cont heparin Will have SLP start PMV trials     DVT prophylaxis: Heparin drip SUP: PPI Diet: tubefeeds Activity: OOB Disposition : ICU  FAMILY  - Updates: No family availabe, being updated by CSW for placement concerns. No changes clinically today.  - Inter-disciplinary family meet or Palliative Care meeting due by:    Simonne Martinet ACNP-BC [facility] Pulmonary/Critical Care Pager # [phone] OR # [phone] if no answer  05/20/2017 10:34 AM  Attending Note:

## 2017-05-20 NOTE — Progress Notes (Addendum)
1:41pm- CSW reached out to pt's son. Unable to leave VM at this time.   CSW spoke with nursing staff on unit and was informed that per ex wife, pt has two children that CSW should be speaking with for decisions on medical needs. CSW  Spoke with Joseph Downs and was informed that pt does have two children a daughter and son. Per sister, no one has ever met pt's daughter and son has not seen pt in over 8 years. CSW received contact information for son Joseph Downs) [phone]. CSW received permission from sister to speak with son as needed. CSW to speak with son about options when appropriate.     Joseph Downs, MSW, [facility] Clinical Social Worker [phone]

## 2017-05-20 NOTE — Progress Notes (Signed)
PROGRESS NOTE    Joseph Downs  AVW:[id number] DOB: 06/06/1950 DOA: 05/01/2017 PCP: Patient, No Pcp Per      Brief Narrative:  Mr. Joseph Downs is a 67 yo M, reportedly homeless, no past medical history that I can find from his recent admission to our service other than obstructive uropathy requiring foley.   Evidently, on 2/13, he was found down in the cold.  Brought to the ER, hypotensive, had Lactate >7 despite 30 cc/kg IVF and re-warming, was admitted to ICU, at that time was mentating enough to explain that he had passed out, not clear why.  That night, developed respiratory distress requiring intubation and hypotension.  Following that, was treated with broad spectrum antibiotics for hypotension, tachycardia and leukocytosis. Renal failure, creatinine up to 7. Started on CVVHD. Blood cultures grew Acineto lwoffii, Urine cx frew staph lugdunensis.  Echo showed severe RV strain and bilateral LE dopplers showed extensive DVT.  CTA not possible, nor catheterization/lysis due to AKI.  Started on heparin.   At time of transfer out of ICU to [facility] service, patient has tolerated 2 days intermittent trach collar, back on vent at night.  Has PEG.  Cardiology, Nephrology, Vascular surgery, and Pulm are following.    Assessment & Plan:  Acute respiratory failure -Trach collar -Vent at night, per PCCM -PPI stress ulcer ppx   Presumed massive PE Bilateral DVT S/p IVC filter IVC filter placed due to doubts of patient compliance with future oral anticoagulation -Continue heparin gtt  Acute renal failure History of recent obstructive uropathy.  ?prostate cancer in setting of Staph lug infection.  Had foley previous in hospitalization, currently out. Doubt obstructive uropathy this time.  Cr improving.   -Consult to nephrology, appreciate cares  Metabolic encephalopathy -Stop clonazepam, avoid benzos -Haldol BID  Hypernatremia 4L free water deficit.  Flushes not given yesterday. -STart  Free water 400 QID -Continue D5   NSTEMI -Consult to Cardiology, appreciate recommendations -Continue aspirin  Anemia, acute Not from blood loss, unclear cause.  Transfused 1 unit earlier in hospitalization.   Stable -Trend CBC  Presumed frostbite Vascular are involved, unclear if debridement is planned.   -Consult WOC  Decision making Patient has two children daughter and son who have yet to be reached, who are legal decision makers.          DVT prophylaxis: N/A on heparin Code Status: FULL Family Communication: None present MDM and disposition Plan: The below labs and imaging reports were reviewed.  His chart to date was reviewed and summarized above.  The patient's status is clinically stable to improving.  The above medicaitons were adjusted or continued, including anticoagulation.    He is now improved to tolerate periods of trach collar during the day.  His tube feeds are being advanced, we are working to resolve his hypernatremia.     Consultants:   Vascular  Nephrology  Pulmonology  Cardiology   Procedures:   Intubation 2/13  IVC filter placement 2/22  Tracheostomy 2/27  PEG tube placement 2/28   Antimicrobials:   Meropenem 2/18 >>  Vancomycin 2/13 >>2/19  Zosyn 2/13 >> 2/18   Antimicrobials:  2/13 BCx 1/2 positive for diphtheroids and Acinetobacter Lwoffii 2/13 UCx Staph lugdunensis 2/19 BCx - NG5D 2/20 BCx - NG5D 2/22 UCx - NG 2/23 UCx - NG   Subjective: Seems alert to my presence, nods to questions.  Follows commands.  No nursing concerns.  Tmax yesterday 100.9.  WBC improved.  Able to tolerate several hours  of trach collar yesterday, then back on the vent around 5PM.  Objective: Vitals:   05/20/17 0700 05/20/17 0730 05/20/17 0732 05/20/17 0755  BP: (!) 144/80     Pulse: 93     Resp: (!) 28     Temp:    99.3 F (37.4 C)  TempSrc:    Oral  SpO2: 100% 99% 99%   Weight:      Height:        Intake/Output Summary (Last  24 hours) at 05/20/2017 0821 Last data filed at 05/20/2017 0600 Gross per 24 hour  Intake 3652 ml  Output 2300 ml  Net 1352 ml   Filed Weights   05/18/17 0500 05/19/17 0458 05/20/17 0500  Weight: 86.8 kg (191 lb 5.8 oz) 85 kg (187 lb 6.3 oz) 87.6 kg (193 lb 2 oz)    Examination: General appearance: Thin adult male, on trach, mitts on, seems alert to me, no obvious distress. HEENT: Anicteric, conjunctiva pink, lids and lashes normal. No nasal deformity, discharge, epistaxis.  Lips dry, dentition poor.   Skin: Warm and dry.  Toes black, tissue damage to above ankles. Cardiac: Tachycardic, regular, nl S1-S2, no murmurs appreciated.  Respiratory: Trach collar in place.  No obvious secretions, nomral respiratory effort.  Dminished breath sounds.   Abdomen: Abdomen soft.  No obvious TTP, nor masses.  MSK: Temporal wasting, diffuse loss of subq fat.   Frostbite/tissue loss/Gangrene in toes. Neuro: Awake and seems alert.  Listlessly moves arms, in mitts.  Does not consistently follow commands.  Trach in place, noncommunicative.    Psych: Unable to assess    Data Reviewed: I have personally reviewed following labs and imaging studies:  CBC: Recent Labs  Lab 05/17/17 0630 05/17/17 2300 05/18/17 0505 05/18/17 1050 05/19/17 0455 05/20/17 0500  WBC 15.6* 14.1* 11.4* 12.0* 11.2* 9.6  NEUTROABS 12.6* 11.0*  --   --   --   --   HGB 6.3* 6.9* 9.5* 7.9* 8.7* 7.8*  HCT 20.9* 22.8* 31.0* 25.5* 27.9* 25.4*  MCV 96.8 94.2 93.7 94.8 95.2 96.2  PLT 409* 346 316 352 359 638   Basic Metabolic Panel: Recent Labs  Lab 05/14/17 0439  05/16/17 0440 05/17/17 0500 05/17/17 1751 05/18/17 0501 05/18/17 0505 05/19/17 0455 05/20/17 0500  NA 151*   < > 161* 159* 155* 156* 154* 154* 154*  K 5.6*   < > 5.1 4.2 4.0 4.0 3.9 3.9 4.0  CL 120*   < > 126* 125* 124* 125* 124* 126* 125*  CO2 19*   < > 20* 22 20* 20* 20* 21* 20*  GLUCOSE 156*   < > 138* 172* 148* 138* 136* 142* 134*  BUN 125*   < > 112* 104*  100* 94* 94* 81* 70*  CREATININE 4.77*   < > 4.84* 4.64* 4.18* 3.94* 3.89* 3.27* 2.76*  CALCIUM 8.1*   < > 8.1* 7.8* 7.2* 7.7* 7.7* 7.8* 7.7*  MG 2.3  --   --  2.2  --   --   --   --   --   PHOS 6.8*   < > 6.8* 5.7*  --  5.5*  --  4.6 4.1   < > = values in this interval not displayed.   GFR: Estimated Creatinine Clearance: 29.3 mL/min (A) (by C-G formula based on SCr of 2.76 mg/dL (H)). Liver Function Tests: Recent Labs  Lab 05/16/17 0440 05/17/17 0500 05/18/17 0501 05/19/17 0455 05/20/17 0500  ALBUMIN 1.5* 1.5* 1.5* 1.5* 1.4*  No results for input(s): LIPASE, AMYLASE in the last 168 hours. No results for input(s): AMMONIA in the last 168 hours. Coagulation Profile: No results for input(s): INR, PROTIME in the last 168 hours. Cardiac Enzymes: No results for input(s): CKTOTAL, CKMB, CKMBINDEX, TROPONINI in the last 168 hours. BNP (last 3 results) No results for input(s): PROBNP in the last 8760 hours. HbA1C: No results for input(s): HGBA1C in the last 72 hours. CBG: Recent Labs  Lab 05/19/17 1707 05/19/17 1959 05/19/17 2345 05/20/17 0428 05/20/17 0752  GLUCAP 119* 117* 125* 106* 125*   Lipid Profile: No results for input(s): CHOL, HDL, LDLCALC, TRIG, CHOLHDL, LDLDIRECT in the last 72 hours. Thyroid Function Tests: No results for input(s): TSH, T4TOTAL, FREET4, T3FREE, THYROIDAB in the last 72 hours. Anemia Panel: No results for input(s): VITAMINB12, FOLATE, FERRITIN, TIBC, IRON, RETICCTPCT in the last 72 hours. Urine analysis:    Component Value Date/Time   COLORURINE AMBER (A) 05/01/2017 1313   APPEARANCEUR TURBID (A) 05/01/2017 1313   LABSPEC 1.014 05/01/2017 1313   PHURINE 5.0 05/01/2017 1313   GLUCOSEU 50 (A) 05/01/2017 1313   HGBUR MODERATE (A) 05/01/2017 1313   BILIRUBINUR NEGATIVE 05/01/2017 1313   KETONESUR NEGATIVE 05/01/2017 1313   PROTEINUR 100 (A) 05/01/2017 1313   NITRITE NEGATIVE 05/01/2017 1313   LEUKOCYTESUR MODERATE (A) 05/01/2017 1313    Sepsis Labs: _0 (procalcitonin:4,lacticacidven:4)  ) Recent Results (from the past 240 hour(s))  Culture, Urine     Status: None   Collection Time: 05/10/17 11:19 AM  Result Value Ref Range Status   Specimen Description URINE, CATHETERIZED  Final   Special Requests NONE  Final   Culture   Final    NO GROWTH Performed at [facility], [place] [address], [place], [place] [address]    Report Status 05/11/2017 FINAL  Final  Culture, Urine     Status: None   Collection Time: 05/11/17 11:21 AM  Result Value Ref Range Status   Specimen Description URINE, CATHETERIZED  Final   Special Requests NONE  Final   Culture   Final    NO GROWTH Performed at [facility], [place] [address], [place], [place] [address]    Report Status 05/12/2017 FINAL  Final         Radiology Studies: No results found.      Scheduled Meds: . aspirin  81 mg Per Tube Daily  . chlorhexidine gluconate (MEDLINE KIT)  15 mL Mouth Rinse BID  . Chlorhexidine Gluconate Cloth  6 each Topical Daily  . clonazepam  0.5 mg Per Tube Daily  . clonazepam  1 mg Per Tube QHS  . free water  400 mL Per Tube QID  . insulin aspart  2-6 Units Subcutaneous Q4H  . ipratropium-albuterol  3 mL Nebulization Q6H  . mouth rinse  15 mL Mouth Rinse QID  . Melatonin  6 mg Oral QHS  . pantoprazole sodium  40 mg Per Tube QHS  . QUEtiapine  25 mg Per Tube Daily  . QUEtiapine  50 mg Per Tube QHS  . silver sulfADIAZINE   Topical Daily  . sodium chloride flush  10-40 mL Intracatheter Q12H  . thiamine injection  100 mg Intravenous Daily   Continuous Infusions: . sodium chloride    . dextrose 50 mL/hr at 05/20/17 0511  . feeding supplement (VITAL AF 1.2 CAL) 1,000 mL (05/19/17 1841)  . heparin 1,600 Units/hr (05/20/17 0512)     LOS: 19 days    Time spent:  37 minutes    Edwin Dada, MD [facility] 05/20/2017, 8:21 AM     Pager [phone] --- please page though AMION:   [email or web address] Password [email or web address] If 7PM-7AM, please contact night-coverage

## 2017-05-20 NOTE — Progress Notes (Signed)
Pt w/ increase in agitation despite 50 mcg fentanyl administered x2. Elink notified. Received orders from Dr. Darrick Pennaeterding for a one time dose of ativan 1 mg. Orders carried out. Nursing will continue to monitor.

## 2017-05-20 NOTE — Progress Notes (Addendum)
2:50pm- CSW received return call from pt's son. Son expressed that he would be here this weekend to see pt. Son agreed to [place] sending any needed information out to ensure that pt's need is met at the time of discharge.   11:14am- CSW received call from pt's sister who reports that she spoke with [place] worker and was informed by this Education officer, museum that she would need to apply for long term medicaid for pt if pt is needing long term placement. At this time pt is on trach collar at 35 percent. CSW to speak with pt's sister on Wed. For further guidance on the Medicaid application. CSW still following for needs at this time.   9:38am- CSW spoke with [place] and was informed once again that pt does not have VA benefits due to not being service connected. CSW spoke with pt's sister to update her of this per her request. At this time CSW remains available for support to family and pt.  CSW spoke with sister this morning via phone. CSW updated sister about conversation with [place]. CSW explained that per [place] CSW, pt does not have benefit through the [place], but all veterans qualify for placement both long term and short term thorough VA. CSW was informed that sister has spoken with MD's on the unit and expressed being hopeful for therapy at the time of discharge. CSW still has permission to send information out on pt. CSW will follow up with pt's sister as needed.    Virgie Dad Tihanna Goodson, MSW, [place] [facility] Clinical Social Worker [phone]

## 2017-05-20 NOTE — NC FL2 (Addendum)
[place] LEVEL OF CARE SCREENING TOOL     IDENTIFICATION  Patient Name: Joseph Downs Birthdate: 1951/01/31 Sex: male Admission Date (Current Location): 05/01/2017  [facility] and [place] Number:  Herbalist and Address:  [facility] [place], [place] [address], [place], [place] [address]      Provider Number: [id number]  Attending Physician Name and Address:  Edwin Dada, *  Relative Name and Phone Number:       Current Level of Care: Hospital Recommended Level of Care: Trach SNF Prior Approval Number:    Date Approved/Denied:   PASRR Number:   [id number]   Discharge Plan: Vent/snf or trach snf depending upon needs at the time of discharge.     Current Diagnoses: Patient Active Problem List   Diagnosis Date Noted  . Acute respiratory failure with hypoxemia ([place])   . Cardiogenic shock ([place])   . Acute cor pulmonale (HCC)   . Pressure injury of skin 05/04/2017  . Acute cystitis with hematuria   . Sepsis ([place])   . Acute deep vein thrombosis (DVT) of proximal vein of both lower extremities (HCC)   . Malnutrition of moderate degree 05/02/2017  . Acute respiratory failure ([place])   . Exposure to weather condition   . Hypotension   . Non-ST elevation (NSTEMI) myocardial infarction ([place])   . Lactic acidemia 05/01/2017  . Acute urinary retention   . AKI (acute kidney injury) ([place])   . Benign prostatic hyperplasia with urinary retention   . ARF (acute renal failure) ([place]) 03/25/2017    Orientation RESPIRATION BLADDER Height & Weight     (intubated unable to assess. )  Trach collar 28 percent.  Incontinent Weight: 193 lb 2 oz (87.6 kg) Height:  5' 10"  (177.8 cm)  BEHAVIORAL SYMPTOMS/MOOD NEUROLOGICAL BOWEL NUTRITION STATUS      Incontinent Diet(please see discharge summary. )  AMBULATORY STATUS COMMUNICATION OF NEEDS Skin   Extensive Assist   Surgical wounds(trach placement. )                       Personal Care  Assistance Level of Assistance  Bathing, Feeding, Dressing Bathing Assistance: Maximum assistance Feeding assistance: Maximum assistance Dressing Assistance: Maximum assistance     Functional Limitations Info  Sight, Hearing, Speech Sight Info: Adequate Hearing Info: Adequate Speech Info: Impaired(pt has trach.)    SPECIAL CARE FACTORS FREQUENCY  PT (By licensed PT), OT (By licensed OT), Speech therapy     PT Frequency: 5 times a week  OT Frequency: 5 times a week      Speech Therapy Frequency: 5 times a week       Contractures Contractures Info: Not present    Additional Factors Info  Code Status, Allergies Code Status Info: Full  Allergies Info: NKA           Current Medications (05/20/2017):  This is the current hospital active medication list Current Facility-Administered Medications  Medication Dose Route Frequency Provider Last Rate Last Dose  . acetaminophen (TYLENOL) solution 650 mg  650 mg Per Tube Q6H PRN Susa Raring, RPH   650 mg at 05/17/17 1246  . acetaminophen (TYLENOL) suppository 325 mg  325 mg Rectal Q4H PRN Susa Raring, RPH      . aspirin chewable tablet 81 mg  81 mg Per Tube Daily Skeet Latch, MD   81 mg at 05/20/17 0932  . chlorhexidine gluconate (MEDLINE KIT) (PERIDEX) 0.12 % solution  15 mL  15 mL Mouth Rinse BID Rigoberto Noel, MD   15 mL at 05/20/17 0852  . Chlorhexidine Gluconate Cloth 2 % PADS 6 each  6 each Topical Daily Rigoberto Noel, MD   6 each at 05/19/17 1842  . dextrose 5 % solution   Intravenous Continuous Edwin Dada, MD 50 mL/hr at 05/20/17 1322 50 mL at 05/20/17 1322  . docusate (COLACE) 50 MG/5ML liquid 100 mg  100 mg Per Tube BID PRN Scatliffe, Kristen D, MD      . feeding supplement (VITAL AF 1.2 CAL) liquid 1,000 mL  1,000 mL Per Tube Continuous Rush Farmer, MD 75 mL/hr at 05/20/17 1304 1,000 mL at 05/20/17 1304  . fentaNYL (SUBLIMAZE) injection 25-50 mcg  25-50 mcg Intravenous Q1H PRN Rush Farmer, MD   50 mcg at 05/20/17 0234  . free water 400 mL  400 mL Per Tube QID Vanetta Shawl, Angela C, DO   400 mL at 05/20/17 1339  . haloperidol (HALDOL) tablet 1 mg  1 mg Oral Daily Danford, Suann Carle, MD   1 mg at 05/20/17 0932  . haloperidol (HALDOL) tablet 2 mg  2 mg Oral QHS Danford, Christopher P, MD      . heparin ADULT infusion 100 units/mL (25000 units/235m sodium chloride 0.45%)  1,600 Units/hr Intravenous Continuous YRush Farmer MD 16 mL/hr at 05/20/17 0512 1,600 Units/hr at 05/20/17 0512  . insulin aspart (novoLOG) injection 2-6 Units  2-6 Units Subcutaneous Q4H MWynell Balloon RPH   2 Units at 05/20/17 1300  . ipratropium-albuterol (DUONEB) 0.5-2.5 (3) MG/3ML nebulizer solution 3 mL  3 mL Nebulization Q6H GSampson Goon MD   3 mL at 05/20/17 0730  . lidocaine (PF) (XYLOCAINE) 1 % injection    PRN MJacqulynn Cadet MD   8 mL at 05/10/17 1447  . MEDLINE mouth rinse  15 mL Mouth Rinse QID ARigoberto Noel MD   15 mL at 05/20/17 1229  . Melatonin TABS 6 mg  6 mg Oral QHS BCollene Gobble MD   6 mg at 05/19/17 2120  . pantoprazole sodium (PROTONIX) 40 mg/20 mL oral suspension 40 mg  40 mg Per Tube QHS MRolla Flatten RPH   40 mg at 05/19/17 2126  . silver sulfADIAZINE (SILVADENE) 1 % cream   Topical Daily BCollene Gobble MD      . sodium chloride flush (NS) 0.9 % injection 10-40 mL  10-40 mL Intracatheter Q12H ARigoberto Noel MD   10 mL at 05/19/17 2137  . sodium chloride flush (NS) 0.9 % injection 10-40 mL  10-40 mL Intracatheter PRN ARigoberto Noel MD      . thiamine (VITAMIN B-1) tablet 100 mg  100 mg Per Tube Daily SChesley Mires MD   100 mg at 05/20/17 1336     Discharge Medications: Please see discharge summary for a list of discharge medications.  Relevant Imaging Results:  Relevant Lab Results:   Additional Information [id number] Pt had trach placed on [place] 648mcuffed.   KiWetzel BjornstadLCSWA

## 2017-05-20 NOTE — Progress Notes (Addendum)
CSW continues to follow for discharge needs at this time.    Khiyan Crace S. Marcoantonio Legault, MSW, LCSW-A Emergency Department Clinical Social Worker [phone] 

## 2017-05-20 NOTE — Progress Notes (Signed)
ANTICOAGULATION CONSULT NOTE  Pharmacy Consult for Heparin  Indication: chest pain/ACS/DVT/?PE  No Known Allergies  Patient Measurements: Height: 5\' 10"  (177.8 cm) Weight: 193 lb 2 oz (87.6 kg) IBW/kg (Calculated) : 73  Vital Signs: Temp: 99.3 F (37.4 C) (03/04 0755) Temp Source: Oral (03/04 0755) BP: 144/80 (03/04 0700) Pulse Rate: 93 (03/04 0700)  Labs: Recent Labs    05/18/17 0505 05/18/17 1050 05/18/17 2309 05/19/17 0455 05/20/17 0500  HGB 9.5* 7.9*  --  8.7* 7.8*  HCT 31.0* 25.5*  --  27.9* 25.4*  PLT 316 352  --  359 334  HEPARINUNFRC  --   --  0.34 0.43 0.51  CREATININE 3.89*  --   --  3.27* 2.76*   Assessment: [name] in the ICU after being "found down".Patient has been found to have bilateral lower extremity DVTs with concern for possible PE. Heparin drip held 3/1 evening due to bleeding around PEG site - resumed on 3/2 as this was resolved.   Heparin level this morning remains therapeutic (HL 0.51, goal of 0.3-0.7). Hgb/Hct/Plt trending slightly down - no further bleeding around PEG site noted per RN report.   Goal of Therapy:  Heparin level 0.3-0.7 units/ml Monitor platelets by anticoagulation protocol: Yes   Plan:  Continue Heparin at 1600 units/hr (16 ml/hr) Will monitor for bleeding around PEG site Will continue to monitor for any signs/symptoms of bleeding and will follow up with heparin level in the a.m.   Thank you for allowing pharmacy to be a part of this patient's care.   Latori Beggs L. Marcy Salvoaymond, PharmD, MS PGY1 Pharmacy Resident Pager: [phone]

## 2017-05-20 NOTE — Progress Notes (Signed)
[facility] Rounding Note  Subjective/Interval History:  RN notes did not get free water yesterday Awake, getting hoyered into chair  Objective Vital signs in last 24 hours: Vitals:   05/20/17 0700 05/20/17 0730 05/20/17 0732 05/20/17 0755  BP: (!) 144/80     Pulse: 93     Resp: (!) 28     Temp:    99.3 F (37.4 C)  TempSrc:    Oral  SpO2: 100% 99% 99%   Weight:      Height:       Weight change: 2.6 kg (5 lb 11.7 oz)  Intake/Output Summary (Last 24 hours) at 05/20/2017 0821 Last data filed at 05/20/2017 0600 Gross per 24 hour  Intake 3652 ml  Output 2300 ml  Net 1352 ml   Physical Exam:  Blood pressure (!) 144/80, pulse 93, temperature 99.3 F (37.4 C), temperature source Oral, resp. rate (!) 28, height '5\' 10"'$  (1.778 m), weight 87.6 kg (193 lb 2 oz), SpO2 99 %.  Opens eyes and nods to voice Trach, PEG (abd binder on to secure) Coarse bS Tachy 110 S1S2 no S3 Abd binder in place so cannot see PEG 3+ LE edema Gangrene toes (dry)  Labs:  Recent Labs  Lab 05/14/17 0442 05/15/17 0400 05/16/17 0440 05/17/17 0500 05/17/17 1751 05/18/17 0501 05/18/17 0505 05/19/17 0455 05/20/17 0500  NA 151* 154* 161* 159* 155* 156* 154* 154* 154*  K 5.6* 5.3* 5.1 4.2 4.0 4.0 3.9 3.9 4.0  CL 120* 121* 126* 125* 124* 125* 124* 126* 125*  CO2 19* 21* 20* 22 20* 20* 20* 21* 20*  GLUCOSE 156* 130* 138* 172* 148* 138* 136* 142* 134*  BUN 125* 118* 112* 104* 100* 94* 94* 81* 70*  CREATININE 4.77* 4.57* 4.84* 4.64* 4.18* 3.94* 3.89* 3.27* 2.76*  CALCIUM 8.1* 8.0* 8.1* 7.8* 7.2* 7.7* 7.7* 7.8* 7.7*  PHOS 6.8* 7.1* 6.8* 5.7*  --  5.5*  --  4.6 4.1    Recent Labs  Lab 05/18/17 0501 05/19/17 0455 05/20/17 0500  ALBUMIN 1.5* 1.5* 1.4*   Recent Labs  Lab 05/17/17 0630 05/17/17 2300 05/18/17 0505 05/18/17 1050 05/19/17 0455 05/20/17 0500  WBC 15.6* 14.1* 11.4* 12.0* 11.2* 9.6  NEUTROABS 12.6* 11.0*  --   --   --   --   HGB 6.3* 6.9* 9.5* 7.9* 8.7* 7.8*  HCT 20.9* 22.8* 31.0* 25.5* 27.9*  25.4*  MCV 96.8 94.2 93.7 94.8 95.2 96.2  PLT 409* 346 316 352 359 334    Recent Labs  Lab 05/19/17 1707 05/19/17 1959 05/19/17 2345 05/20/17 0428 05/20/17 0752  GLUCAP 119* 117* 125* 106* 125*    Medications: . sodium chloride    . dextrose 50 mL/hr at 05/20/17 0511  . feeding supplement (VITAL AF 1.2 CAL) 1,000 mL (05/19/17 1841)  . heparin 1,600 Units/hr (05/20/17 0512)   . aspirin  81 mg Per Tube Daily  . chlorhexidine gluconate (MEDLINE KIT)  15 mL Mouth Rinse BID  . Chlorhexidine Gluconate Cloth  6 each Topical Daily  . clonazepam  0.5 mg Per Tube Daily  . clonazepam  1 mg Per Tube QHS  . free water  400 mL Per Tube QID  . insulin aspart  2-6 Units Subcutaneous Q4H  . ipratropium-albuterol  3 mL Nebulization Q6H  . mouth rinse  15 mL Mouth Rinse QID  . Melatonin  6 mg Oral QHS  . pantoprazole sodium  40 mg Per Tube QHS  . QUEtiapine  25 mg Per Tube  Daily  . QUEtiapine  50 mg Per Tube QHS  . silver sulfADIAZINE   Topical Daily  . sodium chloride flush  10-40 mL Intracatheter Q12H  . thiamine injection  100 mg Intravenous Daily    Background: 67 yo homeless man, found down, adm 05/01/17 with shock, lactic acidosis, bilateral DVT's (IVC filter) diphtheroids and acinetobacter bacteremia and staph lugdensis UTI (ATB's). Trach and PEG this admission. AKI requiring CRRT (2/15, transitioned to HD last HD 05/10/17). Non-oliguric, creatinine falling, hypernatremia to 160's improving with free water. Baseline renal fx not known  Assessment/Recommendations  1. AKI non-oliguric.  Some component rhabdo. Improving. Good UOP. Creatinine continues to fall. Baseline not known. 2. Hypervolemic hypernatremia - getting D5W + free water per tube stuck in 150's - RN reports that appears free water via PEG not given past 24 hours as not programmed into TF flushes but will rectify that situation 3. PE w/R heart strain/NSTEMI/acute cor pulm. Heparin 4. Resp failure w/trach  (2/27) 5. 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 Encephalopathy - improving  Jamal Maes, MD [place] Pager 05/20/2017, 8:29 AM

## 2017-05-20 NOTE — Progress Notes (Signed)
Placed pt on 35% ATC and RN aware. VS stable and RT will continue to monitor.

## 2017-05-21 DIAGNOSIS — I70263 Atherosclerosis of native arteries of extremities with gangrene, bilateral legs: Secondary | ICD-10-CM

## 2017-05-21 LAB — RENAL FUNCTION PANEL
ANION GAP: 8 (ref 5–15)
Albumin: 1.6 g/dL — ABNORMAL LOW (ref 3.5–5.0)
BUN: 57 mg/dL — ABNORMAL HIGH (ref 6–20)
CO2: 19 mmol/L — AB (ref 22–32)
Calcium: 8 mg/dL — ABNORMAL LOW (ref 8.9–10.3)
Chloride: 123 mmol/L — ABNORMAL HIGH (ref 101–111)
Creatinine, Ser: 2.22 mg/dL — ABNORMAL HIGH (ref 0.61–1.24)
GFR, EST AFRICAN AMERICAN: 34 mL/min — AB (ref >60)
GFR, EST NON AFRICAN AMERICAN: 29 mL/min — AB (ref >60)
Glucose, Bld: 132 mg/dL — ABNORMAL HIGH (ref 65–99)
POTASSIUM: 4.2 mmol/L (ref 3.5–5.1)
Phosphorus: 3.1 mg/dL (ref 2.5–4.6)
Sodium: 150 mmol/L — ABNORMAL HIGH (ref 135–145)

## 2017-05-21 LAB — HEPARIN LEVEL (UNFRACTIONATED): Heparin Unfractionated: 0.54 IU/mL (ref 0.30–0.70)

## 2017-05-21 LAB — GLUCOSE, CAPILLARY
GLUCOSE-CAPILLARY: 140 mg/dL — AB (ref 65–99)
GLUCOSE-CAPILLARY: 109 mg/dL — AB (ref 65–99)
GLUCOSE-CAPILLARY: 125 mg/dL — AB (ref 65–99)
Glucose-Capillary: 119 mg/dL — ABNORMAL HIGH (ref 65–99)
Glucose-Capillary: 102 mg/dL — ABNORMAL HIGH (ref 65–99)

## 2017-05-21 LAB — CBC
HEMATOCRIT: 27 % — AB (ref 39.0–52.0)
HEMOGLOBIN: 8.4 g/dL — AB (ref 13.0–17.0)
MCH: 29.8 pg (ref 26.0–34.0)
MCHC: 31.1 g/dL (ref 30.0–36.0)
MCV: 95.7 fL (ref 78.0–100.0)
PLATELETS: 355 10*3/uL (ref 150–400)
RBC: 2.82 MIL/uL — AB (ref 4.22–5.81)
RDW: 15.2 % (ref 11.5–15.5)
WBC: 13.3 10*3/uL — AB (ref 4.0–10.5)

## 2017-05-21 MED ORDER — FREE WATER
400.0000 mL | Freq: Every day | Status: DC
  Administered 2017-05-21 – 2017-05-23 (×11): 400 mL

## 2017-05-21 NOTE — Evaluation (Signed)
Occupational Therapy Evaluation Patient Details Name: Joseph Downs MRN: [id number] DOB: [birth year] Today's Date: 05/21/2017    History of Present Illness This is a 67 year old man who was found down, unresponsive and hypothermic; Critical care service is asked to admit the patient because he still had a lactate acid in excess of 6 after being warmed and fluid resuscitated. Resp insufficiency, and intubated 2/13; Encephalopathy, NSTEMI, Acute Renal Failure; Bil DVTs, massive PE; shock, Bacteremia; IVC filter placed 2/22; Extubated 2/26, re-intubated later that day; received trach 2/27; received PEG 2/28; tolerated trach off vent most of 3/2   Clinical Impression   Pt independent at baseline and homeless. Presents with generalized weakness and poor sitting balance at EOB. He needs significant assistance for bed level mobility and requires lift equipment for OOB due to NWB status B LEs. Pt demonstrated ability to follow basic commands and respond appropriately to yes/no questions, difficult to accurately assess cognition due to trach. Pt with stable VS throughout session. Will follow acutely.    Follow Up Recommendations  LTACH    Equipment Recommendations       Recommendations for Other Services       Precautions / Restrictions Precautions Precautions: Fall Required Braces or Orthoses: Other Brace/Splint Other Brace/Splint: B Prevalon boots Restrictions Weight Bearing Restrictions: Yes RLE Weight Bearing: Non weight bearing LLE Weight Bearing: Non weight bearing Other Position/Activity Restrictions: Frostbite bilateral feet; will proceed NWB, unless otherwise ordered      Mobility Bed Mobility Overal bed mobility: Needs Assistance Bed Mobility: Supine to Sit;Sit to Supine     Supine to sit: Max assist;HOB elevated Sit to supine: +2 for physical assistance;Max assist      Transfers                 General transfer comment: requires lift equipment, NWB B LEs     Balance Overall balance assessment: Needs assistance Sitting-balance support: Single extremity supported;Bilateral upper extremity supported Sitting balance-Leahy Scale: Poor   Postural control: Left lateral lean                                 ADL either performed or assessed with clinical judgement   ADL Overall ADL's : Needs assistance/impaired Eating/Feeding: NPO   Grooming: Wash/dry face;Bed level;Minimal assistance   Upper Body Bathing: Maximal assistance;Sitting   Lower Body Bathing: Total assistance;Bed level   Upper Body Dressing : Maximal assistance;Sitting   Lower Body Dressing: Total assistance;Bed level       Toileting- Clothing Manipulation and Hygiene: Total assistance;Bed level         General ADL Comments: Pt sat EOB x 7 minutes with moderate assist.      Vision   Additional Comments: appears intact     Perception     Praxis      Pertinent Vitals/Pain Pain Assessment: Faces Faces Pain Scale: Hurts even more Pain Location: L LE when moved Pain Descriptors / Indicators: Grimacing;Moaning Pain Intervention(s): Monitored during session;Repositioned     Hand Dominance Right   Extremity/Trunk Assessment Upper Extremity Assessment Upper Extremity Assessment: Generalized weakness(3+/5 B)   Lower Extremity Assessment Lower Extremity Assessment: Defer to PT evaluation   Cervical / Trunk Assessment Cervical / Trunk Assessment: Other exceptions Cervical / Trunk Exceptions: weakness, tends to lean toward L in sitting   Communication Communication Communication: Tracheostomy   Cognition Arousal/Alertness: Awake/alert Behavior During Therapy: WFL for tasks assessed/performed Overall Cognitive Status: Difficult to  assess                                 General Comments: pt following commands, yes/no appears to be reliable, pt with poor safety and awareness of deficits   General Comments       Exercises      Shoulder Instructions      Home Living Family/patient expects to be discharged to:: Unsure                                 Additional Comments: pt is homeless per RN      Prior Functioning/Environment Level of Independence: Independent                 OT Problem List: Decreased strength;Decreased activity tolerance;Impaired balance (sitting and/or standing);Decreased safety awareness;Decreased cognition;Pain      OT Treatment/Interventions: Self-care/ADL training;Therapeutic activities;Patient/family education;Balance training    OT Goals(Current goals can be found in the care plan section) Acute Rehab OT Goals Patient Stated Goal: unable to state OT Goal Formulation: With patient Time For Goal Achievement: 06/04/17 Potential to Achieve Goals: Good ADL Goals Pt Will Perform Grooming: with min assist;sitting Pt Will Perform Upper Body Bathing: with min assist;sitting Pt/caregiver will Perform Home Exercise Program: Increased strength;Both right and left upper extremity;With minimal assist Additional ADL Goal #1: Pt will demonstrate fair sitting balance at EOB in preparation for ADL. Additional ADL Goal #2: Pt will perform bed mobility with moderate assistance in preparation for ADL and to assist with pressure relief and bed positioning  OT Frequency: Min 2X/week   Barriers to D/C:            Co-evaluation              AM-PAC PT "6 Clicks" Daily Activity     Outcome Measure Help from another person eating meals?: Total Help from another person taking care of personal grooming?: A Lot Help from another person toileting, which includes using toliet, bedpan, or urinal?: Total Help from another person bathing (including washing, rinsing, drying)?: Total Help from another person to put on and taking off regular upper body clothing?: A Lot Help from another person to put on and taking off regular lower body clothing?: Total 6 Click Score: 8   End of  Session Equipment Utilized During Treatment: Oxygen  Activity Tolerance: Patient limited by fatigue Patient left: in bed;with bed alarm set;with call bell/phone within reach  OT Visit Diagnosis: Pain;Muscle weakness (generalized) (M62.81);Other symptoms and signs involving cognitive function                Time: 1610-9604 OT Time Calculation (min): 25 min Charges:  OT General Charges $OT Visit: 1 Visit OT Evaluation $OT Eval Moderate Complexity: 1 Mod OT Treatments $Therapeutic Activity: 8-22 mins G-Codes:     05-31-2017 Martie Round, OTR/L Pager: [phone]  Joseph Downs, [place] [employer]iff 05/31/17, 1:43 PM

## 2017-05-21 NOTE — Progress Notes (Signed)
PROGRESS NOTE    Joseph Downs  RJJ:[id number] DOB: 08/27/1950 DOA: 05/01/2017 PCP: Patient, No Pcp Per      Brief Narrative:  [name] is a 67 yo M, reportedly homeless, no past medical history that I can find from his recent admission to our service other than obstructive uropathy requiring foley.   Evidently, on 2/13, he was found down in the cold.  Brought to the ER, hypotensive, had Lactate >7 despite 30 cc/kg IVF and re-warming, was admitted to ICU, at that time was mentating enough to explain that he had passed out, not clear why.  That night, developed respiratory distress requiring intubation and hypotension.  Following that, was treated with broad spectrum antibiotics for hypotension, tachycardia and leukocytosis. Renal failure, creatinine up to 7. Started on CVVHD. Blood cultures grew Acineto lwoffii, Urine cx frew staph lugdunensis.  Echo showed severe RV strain and bilateral LE dopplers showed extensive DVT.  CTA not possible, nor catheterization/lysis due to AKI.  Started on heparin.   At time of transfer out of ICU to [facility] service, patient has tolerated 2 days intermittent trach collar, back on vent at night.  Has PEG.  Cardiology, Nephrology, Vascular surgery, and Pulm are following.    Assessment & Plan:  Acute respiratory failure Has been on trach collar 40% O2 since yesterday.   -Continue trach collar -Vent p.r.n., assistance per PCCM -PPI stress ulcer ppx -SLP eval for PMV, for now, may only use with speech given persistent encephalopathy   Presumed massive PE Bilateral DVT S/p IVC filter IVC filter placed due to doubts of patient compliance with future oral anticoagulation -Continue heparin gtt  Acute renal failure History of recent obstructive uropathy.  ?prostate cancer in setting of Staph lug infection.  Had foley previous in hospitalization, currently out. Doubt obstructive uropathy this time.  Cr improving.   -Consult to nephrology, appreciate  cares  Metabolic encephalopathy -Stop clonazepam, avoid benzos -Haldol BID  Hypernatremia 4L free water deficit.  Flushes not given yesterday. -Continue free water -Stop D5  NSTEMI -Consult to Cardiology, appreciate recommendations -Continue aspirin  Anemia, acute Not from blood loss, unclear cause.  Transfused 1 unit earlier in hospitalization.   Stable -Trend CBC  Presumed frostbite Vascular are involved, unclear if debridement is planned.   -Consult WOC -Consult vascular surgery  Decision making Patient has two children daughter and son.  Son has been contacted, contact information and social work note.          DVT prophylaxis: N/A on heparin Code Status: FULL Family Communication: None present MDM and disposition Plan: The below labs and imaging reports were reviewed.   The patient's status is clinically stable to improving.  The above medicaitons were adjusted or continued, including anticoagulation.    He has now improved enough to tolerate trach collar for the last 24 hours, still with 40% oxygen.  Correcting hypernatremia.  Plan for amputation pending. Disposition SNF when down to 28% oxygen by trach collar, stable on trach collar without need for vent, off heparin, without restraints/mittens.  He does not have LTAC benefits.     Consultants:   Vascular  Nephrology  Pulmonology  Cardiology   Procedures:   Intubation 2/13  IVC filter placement 2/22  Tracheostomy 2/27  PEG tube placement 2/28   Antimicrobials:   Meropenem 2/18 >> 3/4  Vancomycin 2/13 >>2/19  Zosyn 2/13 >> 2/18   Antimicrobials:  2/13 BCx 1/2 positive for diphtheroids and Acinetobacter Lwoffii 2/13 UCx Staph lugdunensis 2/19  BCx - NG5D 2/20 BCx - NG5D 2/22 UCx - NG 2/23 UCx - NG   Subjective: Awake, nods to some questions, not consistently nodding to questions.  No new nursing concerns.  Able to tolerate trach collar for the last 24 hours.  No new  fever.  Objective: Vitals:   05/21/17 0700 05/21/17 0751 05/21/17 0800 05/21/17 0836  BP: 102/66 102/66 131/66   Pulse: 83 89 86   Resp: (!) 24 (!) 21 (!) 26   Temp:   98.7 F (37.1 C) 99.6 F (37.6 C)  TempSrc:   Oral Oral  SpO2: 100% 100% 100%   Weight:      Height:        Intake/Output Summary (Last 24 hours) at 05/21/2017 0842 Last data filed at 05/21/2017 0800 Gross per 24 hour  Intake 3037 ml  Output 2950 ml  Net 87 ml   Filed Weights   05/19/17 0458 05/20/17 0500 05/21/17 0346  Weight: 85 kg (187 lb 6.3 oz) 87.6 kg (193 lb 2 oz) 88.9 kg (195 lb 15.8 oz)    Examination: General appearance: Thin adult male, on trach collar, mitts in place. HEENT: Anicteric, conjunctiva pink, lids and lashes normal. No nasal deformity, discharge, epistaxis.  Lips dry, dentition poor.   Skin: Gangrenous toes.  Bilaterally.  Otherwise no suspicious rashes. Cardiac: RRR, no murmurs, rubs or gallops.  No distal edema. Respiratory: Trach collar in place.  Coarse upper airway sounds.  Plenty of secretions.  Normal respiratory effort.  No wheezes.    Abdomen: Abdomen soft, no obvious tenderness to palpation.  PEG tube in place, binder in place.  MSK: Temporal wasting, diffuse loss of subq fat.   Frostbite/tissue loss/Gangrene in toes. Neuro: Trach collar in place, unable to speak.  Does not consistently follow commands.    Psych: Unable to assess    Data Reviewed: I have personally reviewed following labs and imaging studies:  CBC: Recent Labs  Lab 05/17/17 0630 05/17/17 2300 05/18/17 0505 05/18/17 1050 05/19/17 0455 05/20/17 0500  WBC 15.6* 14.1* 11.4* 12.0* 11.2* 9.6  NEUTROABS 12.6* 11.0*  --   --   --   --   HGB 6.3* 6.9* 9.5* 7.9* 8.7* 7.8*  HCT 20.9* 22.8* 31.0* 25.5* 27.9* 25.4*  MCV 96.8 94.2 93.7 94.8 95.2 96.2  PLT 409* 346 316 352 359 546   Basic Metabolic Panel: Recent Labs  Lab 05/16/17 0440 05/17/17 0500 05/17/17 1751 05/18/17 0501 05/18/17 0505 05/19/17 0455  05/20/17 0500  NA 161* 159* 155* 156* 154* 154* 154*  K 5.1 4.2 4.0 4.0 3.9 3.9 4.0  CL 126* 125* 124* 125* 124* 126* 125*  CO2 20* 22 20* 20* 20* 21* 20*  GLUCOSE 138* 172* 148* 138* 136* 142* 134*  BUN 112* 104* 100* 94* 94* 81* 70*  CREATININE 4.84* 4.64* 4.18* 3.94* 3.89* 3.27* 2.76*  CALCIUM 8.1* 7.8* 7.2* 7.7* 7.7* 7.8* 7.7*  MG  --  2.2  --   --   --   --   --   PHOS 6.8* 5.7*  --  5.5*  --  4.6 4.1   GFR: Estimated Creatinine Clearance: 29.6 mL/min (A) (by C-G formula based on SCr of 2.76 mg/dL (H)). Liver Function Tests: Recent Labs  Lab 05/16/17 0440 05/17/17 0500 05/18/17 0501 05/19/17 0455 05/20/17 0500  ALBUMIN 1.5* 1.5* 1.5* 1.5* 1.4*   No results for input(s): LIPASE, AMYLASE in the last 168 hours. No results for input(s): AMMONIA in the last 168 hours.  Coagulation Profile: No results for input(s): INR, PROTIME in the last 168 hours. Cardiac Enzymes: No results for input(s): CKTOTAL, CKMB, CKMBINDEX, TROPONINI in the last 168 hours. BNP (last 3 results) No results for input(s): PROBNP in the last 8760 hours. HbA1C: No results for input(s): HGBA1C in the last 72 hours. CBG: Recent Labs  Lab 05/20/17 1133 05/20/17 1534 05/20/17 2024 05/20/17 2351 05/21/17 0338  GLUCAP 127* 101* 118* 117* 119*   Lipid Profile: No results for input(s): CHOL, HDL, LDLCALC, TRIG, CHOLHDL, LDLDIRECT in the last 72 hours. Thyroid Function Tests: No results for input(s): TSH, T4TOTAL, FREET4, T3FREE, THYROIDAB in the last 72 hours. Anemia Panel: No results for input(s): VITAMINB12, FOLATE, FERRITIN, TIBC, IRON, RETICCTPCT in the last 72 hours. Urine analysis:    Component Value Date/Time   COLORURINE AMBER (A) 05/01/2017 1313   APPEARANCEUR TURBID (A) 05/01/2017 1313   LABSPEC 1.014 05/01/2017 1313   PHURINE 5.0 05/01/2017 1313   GLUCOSEU 50 (A) 05/01/2017 1313   HGBUR MODERATE (A) 05/01/2017 1313   BILIRUBINUR NEGATIVE 05/01/2017 1313   KETONESUR NEGATIVE 05/01/2017  1313   PROTEINUR 100 (A) 05/01/2017 1313   NITRITE NEGATIVE 05/01/2017 1313   LEUKOCYTESUR MODERATE (A) 05/01/2017 1313   Sepsis Labs: @LABRCNTIP (procalcitonin:4,lacticacidven:4)  ) Recent Results (from the past 240 hour(s))  Culture, Urine     Status: None   Collection Time: 05/11/17 11:21 AM  Result Value Ref Range Status   Specimen Description URINE, CATHETERIZED  Final   Special Requests NONE  Final   Culture   Final    NO GROWTH Performed at [facility], [place] [address] [place], [place] [address]    Report Status 05/12/2017 FINAL  Final         Radiology Studies: No results found.      Scheduled Meds: . aspirin  81 mg Per Tube Daily  . chlorhexidine gluconate (MEDLINE KIT)  15 mL Mouth Rinse BID  . Chlorhexidine Gluconate Cloth  6 each Topical Daily  . free water  400 mL Per Tube QID  . haloperidol  1 mg Oral Daily  . haloperidol  2 mg Oral QHS  . insulin aspart  2-6 Units Subcutaneous Q4H  . ipratropium-albuterol  3 mL Nebulization Q6H  . mouth rinse  15 mL Mouth Rinse QID  . Melatonin  6 mg Oral QHS  . pantoprazole sodium  40 mg Per Tube QHS  . silver sulfADIAZINE   Topical Daily  . sodium chloride flush  10-40 mL Intracatheter Q12H  . thiamine  100 mg Per Tube Daily   Continuous Infusions: . dextrose 50 mL/hr at 05/21/17 0800  . feeding supplement (VITAL AF 1.2 CAL) 1,000 mL (05/21/17 0800)  . heparin 1,600 Units/hr (05/21/17 0800)     LOS: 20 days    Time spent: 20 minutes    Edwin Dada, MD [facility] 05/21/2017, 8:42 AM     Pager [phone] --- please page though AMION:  [email or web address] If 7PM-7AM, please contact night-coverage

## 2017-05-21 NOTE — Progress Notes (Signed)
CSW spoke with MD (Danford) regarding plan of care for pt at this time. CSW was made aware that pt is still on trach collar now at 28 percent and is in mittens. CSW explained to MD that in order for pt be considered for a trach snf at this time pt would need to remain in trach collar at 28 percent as well as be out of restraints before facility would take pt. CSW was informed that MD would be consulting with Vascular today to see what further plans remain with pt's foot at this time as well as potentially try to wean pt down from heparin . CSW remains available for support and any discharge needs at this time.    Claude MangesKierra S. Baptiste Littler, MSW, LCSW-A Emergency Department Clinical Social Worker [phone]

## 2017-05-21 NOTE — Progress Notes (Addendum)
VASCULAR SURGERY ASSESSMENT & PLAN:   GANGRENE OF BOTH FEET: This is clearly a difficult situation in that he has extensive wounds involving all of the toes on both feet the medial and lateral foot and also the heel.  Based on his vascular exam now on his previous arterial duplex study I think he has adequate circulation for healing and therefore I do not think an arteriogram is necessary at this point.  He has some chronic kidney disease and I would be reluctant to give him contrast regardless.  CO2 arteriography would not give adequate detail.  However with biphasic Doppler signals in both feet I do not think this is necessary.  Unfortunately, I think given the extent of the wounds he will ultimately likely require bilateral amputations.  Currently I do not think the feet are making him septic so this is not something that needs to be done urgently.  I have not seen any family at the bedside to discuss the overall situation with.  Situation is further complicated on the left in that he has an extensive DVT extending up to the femoral vein with extensive left lower extremity swelling.  I think he has adequate circulation to heal below the knee amputations, however if he is not a candidate for bilateral prostheses then above-knee amputations should be considered.  We will continue to follow.   SUBJECTIVE:   Arousable but does not really respond to questions or follow commands.  PHYSICAL EXAM:   Vitals:   05/21/17 1100 05/21/17 1202 05/21/17 1208 05/21/17 1300  BP: (!) 143/82  130/67 136/77  Pulse: 87   82  Resp: (!) 32  19 (!) 24  Temp:  98.4 F (36.9 C)    TempSrc:  Oral    SpO2: 100%  99% 100%  Weight:      Height:       On the right side there is a biphasic anterior tibial and dorsalis pedis signal.  I am unable to obtain a posterior tibial signal however there is a full-thickness wound overlying the medial distal right leg. On the right side there is a full-thickness heel wound,  what appears to be full-thickness wounds on the lateral and medial aspects of the feet in addition to gangrene of all of the toes.  On the left side, there is a biphasic posterior tibial signal.  The patient has a monophasic anterior tibial signal. On the left side, all the toes have dry gangrene.  There is also full-thickness wounds on the medial and lateral aspects of the foot and also gangrene of the left heel. In addition, the left leg is swollen.  LABS:   Lab Results  Component Value Date   WBC 13.3 (H) 05/21/2017   HGB 8.4 (L) 05/21/2017   HCT 27.0 (L) 05/21/2017   MCV 95.7 05/21/2017   PLT 355 05/21/2017   Lab Results  Component Value Date   CREATININE 2.22 (H) 05/21/2017   CBG (last 3)  Recent Labs    05/21/17 0338 05/21/17 0831 05/21/17 1156  GLUCAP 119* 140* 109*   VENOUS DUPLEX: Venous duplex scan on 05/02/2017 showed extensive DVT of the left lower extremity involving the femoral vein, popliteal vein, and tibial veins.  There was also a acute DVT in the tibial veins on the right lower extremity.  ARTERIAL DUPLEX: Arterial duplex on 05/08/2017 did show biphasic signals on the right in both the posterior tibial and anterior tibial arteries.  On the left side there were triphasic  signals in the posterior tibial artery but the anterior tibial artery cannot be visualized.  PROBLEM LIST:    Active Problems:   Lactic acidemia   Acute respiratory failure (HCC)   Exposure to weather condition   Hypotension   Non-ST elevation (NSTEMI) myocardial infarction (HCC)   Malnutrition of moderate degree   Acute cystitis with hematuria   Sepsis ([place])   Acute deep vein thrombosis (DVT) of proximal vein of both lower extremities (HCC)   Pressure injury of skin   Cardiogenic shock (HCC)   Acute cor pulmonale (HCC)   Acute respiratory failure with hypoxemia (HCC)   CURRENT MEDS:   . aspirin  81 mg Per Tube Daily  . chlorhexidine gluconate (MEDLINE KIT)  15 mL Mouth Rinse BID    . Chlorhexidine Gluconate Cloth  6 each Topical Daily  . free water  400 mL Per Tube 6 X Daily  . haloperidol  1 mg Oral Daily  . haloperidol  2 mg Oral QHS  . insulin aspart  2-6 Units Subcutaneous Q4H  . ipratropium-albuterol  3 mL Nebulization Q6H  . mouth rinse  15 mL Mouth Rinse QID  . Melatonin  6 mg Oral QHS  . pantoprazole sodium  40 mg Per Tube QHS  . silver sulfADIAZINE   Topical Daily  . sodium chloride flush  10-40 mL Intracatheter Q12H  . thiamine  100 mg Per Tube Daily    Deitra Mayo Beeper: [phone] Office: [phone] 05/21/2017

## 2017-05-21 NOTE — Progress Notes (Signed)
[facility] Rounding Note  Subjective/Interval History:  Getting free water via tube [facility] just stopped his D5 (which was his other free water source) Awake alert Getting cleaned up  Objective Vital signs in last 24 hours: Vitals:   05/21/17 0600 05/21/17 0700 05/21/17 0751 05/21/17 0800  BP: (!) 147/87 102/66 102/66 131/66  Pulse:  83 89 86  Resp: 13 (!) 24 (!) 21 (!) 26  Temp:    98.7 F (37.1 C)  TempSrc:    Oral  SpO2:  100% 100% 100%  Weight:      Height:       Weight change: 1.3 kg (2 lb 13.9 oz)  Intake/Output Summary (Last 24 hours) at 05/21/2017 0942 Last data filed at 05/21/2017 0800 Gross per 24 hour  Intake 2971 ml  Output 2950 ml  Net 21 ml   Physical Exam:  Blood pressure 131/66, pulse 86, temperature 98.7 F (37.1 C), temperature source Oral, resp. rate (!) 26, height 5' 10"  (1.778 m), weight 88.9 kg (195 lb 15.8 oz), SpO2 100 %.  Awake, alert, smiles Trach, PEG (abd binder on to secure) Coarse BS S1S2 no S3 Abd binder in place so cannot see PEG 2+ edema LE's Gangrene toes (dry)   Recent Labs  Lab 05/15/17 0400 05/16/17 0440 05/17/17 0500 05/17/17 1751 05/18/17 0501 05/18/17 0505 05/19/17 0455 05/20/17 0500  NA 154* 161* 159* 155* 156* 154* 154* 154*  K 5.3* 5.1 4.2 4.0 4.0 3.9 3.9 4.0  CL 121* 126* 125* 124* 125* 124* 126* 125*  CO2 21* 20* 22 20* 20* 20* 21* 20*  GLUCOSE 130* 138* 172* 148* 138* 136* 142* 134*  BUN 118* 112* 104* 100* 94* 94* 81* 70*  CREATININE 4.57* 4.84* 4.64* 4.18* 3.94* 3.89* 3.27* 2.76*  CALCIUM 8.0* 8.1* 7.8* 7.2* 7.7* 7.7* 7.8* 7.7*  PHOS 7.1* 6.8* 5.7*  --  5.5*  --  4.6 4.1    Recent Labs  Lab 05/18/17 0501 05/19/17 0455 05/20/17 0500  ALBUMIN 1.5* 1.5* 1.4*   Recent Labs  Lab 05/17/17 0630 05/17/17 2300 05/18/17 0505 05/18/17 1050 05/19/17 0455 05/20/17 0500  WBC 15.6* 14.1* 11.4* 12.0* 11.2* 9.6  NEUTROABS 12.6* 11.0*  --   --   --   --   HGB 6.3* 6.9* 9.5* 7.9* 8.7* 7.8*  HCT 20.9* 22.8* 31.0* 25.5*  27.9* 25.4*  MCV 96.8 94.2 93.7 94.8 95.2 96.2  PLT 409* 346 316 352 359 334    Recent Labs  Lab 05/20/17 1534 05/20/17 2024 05/20/17 2351 05/21/17 0338 05/21/17 0831  GLUCAP 101* 118* 117* 119* 140*    Medications: . feeding supplement (VITAL AF 1.2 CAL) 1,000 mL (05/21/17 0800)  . heparin 1,600 Units/hr (05/21/17 0800)   . aspirin  81 mg Per Tube Daily  . chlorhexidine gluconate (MEDLINE KIT)  15 mL Mouth Rinse BID  . Chlorhexidine Gluconate Cloth  6 each Topical Daily  . free water  400 mL Per Tube 6 X Daily  . haloperidol  1 mg Oral Daily  . haloperidol  2 mg Oral QHS  . insulin aspart  2-6 Units Subcutaneous Q4H  . ipratropium-albuterol  3 mL Nebulization Q6H  . mouth rinse  15 mL Mouth Rinse QID  . Melatonin  6 mg Oral QHS  . pantoprazole sodium  40 mg Per Tube QHS  . silver sulfADIAZINE   Topical Daily  . sodium chloride flush  10-40 mL Intracatheter Q12H  . thiamine  100 mg Per Tube Daily  Background: 67 yo homeless man, found down, adm 05/01/17 with shock, lactic acidosis, bilateral DVT's (IVC filter) diphtheroids and acinetobacter bacteremia and staph lugdensis UTI (ATB's). Trach and PEG this admission. AKI requiring CRRT (2/15, transitioned to HD last HD 05/10/17). Non-oliguric, creatinine falling, hypernatremia to 160's improving with free water. Baseline renal fx not known  Assessment/Recommendations  1. AKI non-oliguric.  Some component rhabdo. Improving. Good UOP. Creatinine continues to fall. Baseline not known. 2. Hypervolemic hypernatremia - was getting D5W + free water per tube stuck in 150's - TRH has just stopped his IV free water so I have ^ his water via tube to 6 times a day 3. PE w/R heart strain/NSTEMI/acute cor pulm. Heparin 4. Resp failure w/trach (2/27) 5. 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 Encephalopathy - improving daily  Renal function continues to get better and I think Na will improve with additional free  water (as long as he gets it). Renal will sign off at this time. Please call if questions.  Jamal Maes, MD [facility] [phone] Pager 05/21/2017, 9:42 AM

## 2017-05-21 NOTE — Progress Notes (Signed)
ANTICOAGULATION CONSULT NOTE  Pharmacy Consult for Heparin  Indication: chest pain/ACS/DVT/?PE  No Known Allergies  Patient Measurements: Height: 5\' 10"  (177.8 cm) Weight: 195 lb 15.8 oz (88.9 kg) IBW/kg (Calculated) : 73  Vital Signs: Temp: 98.7 F (37.1 C) (03/05 0800) Temp Source: Oral (03/05 0800) BP: 133/76 (03/05 1000) Pulse Rate: 85 (03/05 1000)  Labs: Recent Labs    05/19/17 0455 05/20/17 0500 05/21/17 0906  HGB 8.7* 7.8* 8.4*  HCT 27.9* 25.4* 27.0*  PLT 359 334 355  HEPARINUNFRC 0.43 0.51 0.54  CREATININE 3.27* 2.76* 2.22*   Assessment: [name] in the ICU after being "found down".Patient has been found to have bilateral lower extremity DVTs with concern for possible PE. Heparin drip held 3/1 evening due to bleeding around PEG site - resumed on 3/2 as this was resolved.   Heparin level this morning remains therapeutic (HL 0.54, goal of 0.3-0.7). Hgb/Hct/Plt trending up - no further bleeding around PEG site noted per RN report.   Goal of Therapy:  Heparin level 0.3-0.7 units/ml Monitor platelets by anticoagulation protocol: Yes   Plan:  Continue Heparin at 1600 units/hr (16 ml/hr) Will continue to monitor for any signs/symptoms of bleeding and will follow up with heparin level in the a.m.   Thank you for allowing pharmacy to be a part of this patient's care.   Cortlan Dolin L. Marcy Salvoaymond, PharmD, MS PGY1 Pharmacy Resident Pager: [phone]

## 2017-05-22 DIAGNOSIS — Z9289 Personal history of other medical treatment: Secondary | ICD-10-CM

## 2017-05-22 LAB — CBC
HCT: 27.3 % — ABNORMAL LOW (ref 39.0–52.0)
Hemoglobin: 8.4 g/dL — ABNORMAL LOW (ref 13.0–17.0)
MCH: 29 pg (ref 26.0–34.0)
MCHC: 30.8 g/dL (ref 30.0–36.0)
MCV: 94.1 fL (ref 78.0–100.0)
PLATELETS: 376 10*3/uL (ref 150–400)
RBC: 2.9 MIL/uL — ABNORMAL LOW (ref 4.22–5.81)
RDW: 14.6 % (ref 11.5–15.5)
WBC: 13.8 10*3/uL — AB (ref 4.0–10.5)

## 2017-05-22 LAB — RENAL FUNCTION PANEL
ALBUMIN: 1.6 g/dL — AB (ref 3.5–5.0)
Anion gap: 10 (ref 5–15)
BUN: 51 mg/dL — AB (ref 6–20)
CO2: 20 mmol/L — ABNORMAL LOW (ref 22–32)
CREATININE: 2.08 mg/dL — AB (ref 0.61–1.24)
Calcium: 8 mg/dL — ABNORMAL LOW (ref 8.9–10.3)
Chloride: 117 mmol/L — ABNORMAL HIGH (ref 101–111)
GFR calc Af Amer: 37 mL/min — ABNORMAL LOW (ref >60)
GFR, EST NON AFRICAN AMERICAN: 32 mL/min — AB (ref >60)
Glucose, Bld: 135 mg/dL — ABNORMAL HIGH (ref 65–99)
Phosphorus: 2.6 mg/dL (ref 2.5–4.6)
Potassium: 4.1 mmol/L (ref 3.5–5.1)
Sodium: 147 mmol/L — ABNORMAL HIGH (ref 135–145)

## 2017-05-22 LAB — GLUCOSE, CAPILLARY
GLUCOSE-CAPILLARY: 108 mg/dL — AB (ref 65–99)
GLUCOSE-CAPILLARY: 112 mg/dL — AB (ref 65–99)
GLUCOSE-CAPILLARY: 114 mg/dL — AB (ref 65–99)
GLUCOSE-CAPILLARY: 121 mg/dL — AB (ref 65–99)
Glucose-Capillary: 117 mg/dL — ABNORMAL HIGH (ref 65–99)
Glucose-Capillary: 120 mg/dL — ABNORMAL HIGH (ref 65–99)
Glucose-Capillary: 120 mg/dL — ABNORMAL HIGH (ref 65–99)

## 2017-05-22 LAB — HEPARIN LEVEL (UNFRACTIONATED): HEPARIN UNFRACTIONATED: 0.61 [IU]/mL (ref 0.30–0.70)

## 2017-05-22 NOTE — Progress Notes (Signed)
[facility] Rounding Note  Subjective/Interval History:  VVS Dr. Scot Dock evaluated feet Feels will prob need bilat amputations Getting all free water now via tube Na into 140's (147 today) Renal function continues to improve  Objective Vital signs in last 24 hours: Vitals:   05/22/17 0400 05/22/17 0409 05/22/17 0500 05/22/17 0600  BP:  113/63 127/79 (!) 142/75  Pulse: 89 82 92 88  Resp: (!) 32 15 16 (!) 30  Temp:      TempSrc:      SpO2: 100% 100% 99% 100%  Weight:   85.6 kg (188 lb 11.4 oz)   Height:       Weight change: -3.3 kg (-4.4 oz)  Intake/Output Summary (Last 24 hours) at 05/22/2017 0715 Last data filed at 05/22/2017 0600 Gross per 24 hour  Intake 2330 ml  Output 3250 ml  Net -920 ml   Physical Exam:  Blood pressure (!) 142/75, pulse 88, temperature 99 F (37.2 C), temperature source Oral, resp. rate (!) 30, height 5' 10"  (1.778 m), weight 85.6 kg (188 lb 11.4 oz), SpO2 100 %.  Awake, alert, smiles Trach, PEG (abd binder on to secure) Coarse BS S1S2 no S3 Abd binder in place so cannot see PEG 2+ edema LE's Gangrene toes (dry)   Recent Labs  Lab 05/16/17 0440 05/17/17 0500 05/17/17 1751 05/18/17 0501 05/18/17 0505 05/19/17 0455 05/20/17 0500 05/21/17 0906 05/22/17 0305  NA 161* 159* 155* 156* 154* 154* 154* 150* 147*  K 5.1 4.2 4.0 4.0 3.9 3.9 4.0 4.2 4.1  CL 126* 125* 124* 125* 124* 126* 125* 123* 117*  CO2 20* 22 20* 20* 20* 21* 20* 19* 20*  GLUCOSE 138* 172* 148* 138* 136* 142* 134* 132* 135*  BUN 112* 104* 100* 94* 94* 81* 70* 57* 51*  CREATININE 4.84* 4.64* 4.18* 3.94* 3.89* 3.27* 2.76* 2.22* 2.08*  CALCIUM 8.1* 7.8* 7.2* 7.7* 7.7* 7.8* 7.7* 8.0* 8.0*  PHOS 6.8* 5.7*  --  5.5*  --  4.6 4.1 3.1 2.6    Recent Labs  Lab 05/20/17 0500 05/21/17 0906 05/22/17 0305  ALBUMIN 1.4* 1.6* 1.6*   Recent Labs  Lab 05/17/17 0630 05/17/17 2300  05/19/17 0455 05/20/17 0500 05/21/17 0906 05/22/17 0305  WBC 15.6* 14.1*   < > 11.2* 9.6 13.3* 13.8*   NEUTROABS 12.6* 11.0*  --   --   --   --   --   HGB 6.3* 6.9*   < > 8.7* 7.8* 8.4* 8.4*  HCT 20.9* 22.8*   < > 27.9* 25.4* 27.0* 27.3*  MCV 96.8 94.2   < > 95.2 96.2 95.7 94.1  PLT 409* 346   < > 359 334 355 376   < > = values in this interval not displayed.    Recent Labs  Lab 05/21/17 1156 05/21/17 1531 05/21/17 1929 05/22/17 0003 05/22/17 0340  GLUCAP 109* 125* 102* 120* 108*    Medications: . feeding supplement (VITAL AF 1.2 CAL) 1,000 mL (05/21/17 1400)  . heparin 1,600 Units/hr (05/22/17 0600)   . aspirin  81 mg Per Tube Daily  . chlorhexidine gluconate (MEDLINE KIT)  15 mL Mouth Rinse BID  . free water  400 mL Per Tube 6 X Daily  . haloperidol  1 mg Oral Daily  . haloperidol  2 mg Oral QHS  . insulin aspart  2-6 Units Subcutaneous Q4H  . ipratropium-albuterol  3 mL Nebulization Q6H  . mouth rinse  15 mL Mouth Rinse QID  . Melatonin  6  mg Oral QHS  . pantoprazole sodium  40 mg Per Tube QHS  . silver sulfADIAZINE   Topical Daily  . sodium chloride flush  10-40 mL Intracatheter Q12H  . thiamine  100 mg Per Tube Daily    Background: 67 yo homeless man, found down, adm 05/01/17 with shock, lactic acidosis, bilateral DVT's (IVC filter) diphtheroids and acinetobacter bacteremia and staph lugdensis UTI (ATB's). Trach and PEG this admission. AKI requiring CRRT (2/15, transitioned to HD last HD 05/10/17). Non-oliguric, creatinine falling, hypernatremia to 160's improving with free water. Baseline renal fx not known  Assessment/Recommendations  1. AKI non-oliguric.  Some component rhabdo. Improving. Good UOP. Creatinine continues to fall. Baseline not known. 2. Hypervolemic hypernatremia - was getting D5W + free water per tube stuck in 150's - TRH stopped his IV free water so ^'d his water via tube to 6 times a day and Na falling now 3. PE w/R heart strain/NSTEMI/acute cor pulm. Heparin 4. Resp failure w/trach (2/27) 5. Ischemic feet with dry gangrene as consequence of  frostbite 6. Encephalopathy - improving daily  Renal function continues to get better and I think Na will improve with additional free water (as long as he gets it). Renal will sign off at this time. Please call if questions.  Jamal Maes, MD [facility] [phone] Pager 05/22/2017, 7:15 AM

## 2017-05-22 NOTE — Progress Notes (Signed)
Removed trach sutures per MD order. Site cleansed and new trach ties and gauze applied.

## 2017-05-22 NOTE — Progress Notes (Signed)
PROGRESS NOTE    RYUN Joseph Downs  QPY:[id number] DOB: [birth year] DOA: 05/01/2017 PCP: Patient, No Pcp Per   Brief Narrative:  Mr. Vanderwerf is a 67 yo M, reportedly homeless, no past medical history that I can find from his recent admission to our service other than obstructive uropathy requiring foley.  Evidently, on 2/13, he was found down in the cold.  Brought to the ER, hypotensive, had Lactate >7 despite 30 cc/kg IVF and re-warming, was admitted to ICU, at that time was mentating enough to explain that he had passed out, not clear why.  That night, developed respiratory distress requiring intubation and hypotension. Following that, was treated with broad spectrum antibiotics for hypotension, tachycardia and leukocytosis. Renal failure, creatinine up to 7. Started on CVVHD. Blood cultures grew Acineto lwoffii, Urine cx frew staph lugdunensis.  Echo showed severe RV strain and bilateral LE dopplers showed extensive DVT.  CTA not possible, nor catheterization/lysis due to AKI.  Started on heparin.  At time of transfer out of ICU to [facility] service, patient has tolerated 2 days intermittent trach collar, back on vent at night.  Has PEG.  Cardiology, Nephrology, Vascular surgery, and Pulm are following.  Assessment & Plan   Acute respiratory failure -PCCM follow as patient has trach collar  Presumed massive pulmonary embolism/Bilateral DVT -Status post IVC filter-placed due to doubts of patient's future compliance with oral anticoagulation -Continue heparin  Acute kidney injury -History of recent obstructive uropathy. ?  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) -She did have a Foley catheter previous hospitalization -Creatinine currently appears to be improving -Nephrology consulted and appreciated and is now signed off  Bacteremia -Cultures from 05/01/2017 showed diphtheroids, Acinetobacter lwoffii -Repeat blood cultures show no growth -Patient  was placed on vancomycin and Zosyn however then transitioned to meropenem  Hyponatremia -Continue free water, monitor BMP  Acute metabolic encephalopathy -Appears to be improving slowly -Avoid benzodiazepines -continue Haldol  NSTEMI -Cardiology consulted and appreciated, continue aspirin -Possibly related to PE  Normocytic Anemia -Unclear etiology -Was transfused 1 unit PRBC earlier this hospitalization -Hemoglobin currently 8.4, remaining stable -Continue to monitor CBC  Presumed frostbite/Gangrene of feet -Vascular surgery consulted and appreciated -Wound care consulted -Patient may need bilateral lower extremity amputations  DVT Prophylaxis heparin  Code Status: Full  Family Communication: None at bedside.  Patient does have 2 children.  Disposition Plan: admitted. Dispo pending- need decision regarding possible amputation   Consultants PCCM Nephrology Cardiology Vascular surgery  Procedures  Intubated/extubated on 05/01/2017 through 05/15/2017 IVC filter placement 05/10/2017 Trach placed on 05/15/2017 PEG placed 05/16/2017  Antibiotics   Anti-infectives (From admission, onward)   Start     Dose/Rate Route Frequency Ordered Stop   05/07/17 2200  meropenem (MERREM) 500 mg in sodium chloride 0.9 % 100 mL IVPB     500 mg 200 mL/hr over 30 Minutes Intravenous Every 24 hours 05/07/17 1306 05/19/17 1911   05/07/17 0930  meropenem (MERREM) 1 g in sodium chloride 0.9 % 100 mL IVPB  Status:  Discontinued     1 g 200 mL/hr over 30 Minutes Intravenous Every 12 hours 05/07/17 0908 05/07/17 1306   05/06/17 1300  meropenem (MERREM) 500 mg in sodium chloride 0.9 % 100 mL IVPB  Status:  Discontinued     500 mg 200 mL/hr over 30 Minutes Intravenous Every 24 hours 05/06/17 1146 05/07/17 0908   05/04/17 2200  vancomycin (VANCOCIN) IVPB 1000 mg/200 mL premix  1,000 mg 200 mL/hr over 60 Minutes Intravenous Every 24 hours 05/04/17 0933 05/07/17 2339   05/04/17 1400   piperacillin-tazobactam (ZOSYN) IVPB 3.375 g  Status:  Discontinued     3.375 g 100 mL/hr over 30 Minutes Intravenous Every 6 hours 05/04/17 0932 05/06/17 1146   05/03/17 1300  vancomycin (VANCOCIN) IVPB 1000 mg/200 mL premix  Status:  Discontinued     1,000 mg 200 mL/hr over 60 Minutes Intravenous Every 48 hours 05/02/17 1340 05/04/17 0933   05/02/17 2000  piperacillin-tazobactam (ZOSYN) IVPB 2.25 g  Status:  Discontinued     2.25 g 100 mL/hr over 30 Minutes Intravenous Every 6 hours 05/02/17 1342 05/04/17 0932   05/02/17 1300  vancomycin (VANCOCIN) IVPB 1000 mg/200 mL premix  Status:  Discontinued     1,000 mg 200 mL/hr over 60 Minutes Intravenous Every 24 hours 05/01/17 1313 05/02/17 1340   05/01/17 2000  piperacillin-tazobactam (ZOSYN) IVPB 3.375 g  Status:  Discontinued     3.375 g 12.5 mL/hr over 240 Minutes Intravenous Every 8 hours 05/01/17 1313 05/02/17 1342   05/01/17 1200  vancomycin (VANCOCIN) 1,750 mg in sodium chloride 0.9 % 500 mL IVPB     1,750 mg 250 mL/hr over 120 Minutes Intravenous  Once 05/01/17 1121 05/01/17 1409   05/01/17 1130  piperacillin-tazobactam (ZOSYN) IVPB 3.375 g     3.375 g 100 mL/hr over 30 Minutes Intravenous  Once 05/01/17 1119 05/01/17 1217   05/01/17 1130  vancomycin (VANCOCIN) IVPB 1000 mg/200 mL premix  Status:  Discontinued     1,000 mg 200 mL/hr over 60 Minutes Intravenous  Once 05/01/17 1119 05/01/17 1121      Subjective:   [name] seen and examined today.  Patient with no complaints today.  Denies any chest pain, shortness breath, abdominal pain, nausea or vomiting, diarrhea or constipation.  Objective:   Vitals:   05/22/17 1000 05/22/17 1100 05/22/17 1156 05/22/17 1300  BP: 121/83 (!) 145/78    Pulse: 82 87    Resp: 17 (!) 22    Temp:   98.8 F (37.1 C)   TempSrc:   Oral   SpO2: 99% 100% 100% 100%  Weight:      Height:        Intake/Output Summary (Last 24 hours) at 05/22/2017 1334 Last data filed at 05/22/2017 1200 Gross  per 24 hour  Intake 2731 ml  Output 2250 ml  Net 481 ml   Filed Weights   05/20/17 0500 05/21/17 0346 05/22/17 0500  Weight: 87.6 kg (193 lb 2 oz) 88.9 kg (195 lb 15.8 oz) 85.6 kg (188 lb 11.4 oz)    Exam  General: Well developed, well nourished, NAD  HEENT: NCAT,  mucous membranes moist.  Poor dentition  Neck: Trach  Cardiovascular: S1 S2 auscultated, no rubs, murmurs or gallops. Regular rate and rhythm.  Respiratory: Clear to auscultation bilaterally with equal chest rise  Abdomen: Soft, nontender, nondistended, + bowel sounds, PEG placed, abdominal binder  Extremities: warm dry without cyanosis clubbing.  Feet with dressings in place  Neuro: Awake, alert. Nonfocal. Orientation difficult to assess due to trach  Data Reviewed: I have personally reviewed following labs and imaging studies  CBC: Recent Labs  Lab 05/17/17 0630 05/17/17 2300  05/18/17 1050 05/19/17 0455 05/20/17 0500 05/21/17 0906 05/22/17 0305  WBC 15.6* 14.1*   < > 12.0* 11.2* 9.6 13.3* 13.8*  NEUTROABS 12.6* 11.0*  --   --   --   --   --   --  HGB 6.3* 6.9*   < > 7.9* 8.7* 7.8* 8.4* 8.4*  HCT 20.9* 22.8*   < > 25.5* 27.9* 25.4* 27.0* 27.3*  MCV 96.8 94.2   < > 94.8 95.2 96.2 95.7 94.1  PLT 409* 346   < > 352 359 334 355 376   < > = values in this interval not displayed.   Basic Metabolic Panel: Recent Labs  Lab 05/17/17 0500  05/18/17 0501 05/18/17 0505 05/19/17 0455 05/20/17 0500 05/21/17 0906 05/22/17 0305  NA 159*   < > 156* 154* 154* 154* 150* 147*  K 4.2   < > 4.0 3.9 3.9 4.0 4.2 4.1  CL 125*   < > 125* 124* 126* 125* 123* 117*  CO2 22   < > 20* 20* 21* 20* 19* 20*  GLUCOSE 172*   < > 138* 136* 142* 134* 132* 135*  BUN 104*   < > 94* 94* 81* 70* 57* 51*  CREATININE 4.64*   < > 3.94* 3.89* 3.27* 2.76* 2.22* 2.08*  CALCIUM 7.8*   < > 7.7* 7.7* 7.8* 7.7* 8.0* 8.0*  MG 2.2  --   --   --   --   --   --   --   PHOS 5.7*  --  5.5*  --  4.6 4.1 3.1 2.6   < > = values in this interval  not displayed.   GFR: Estimated Creatinine Clearance: 36.1 mL/min (A) (by C-G formula based on SCr of 2.08 mg/dL (H)). Liver Function Tests: Recent Labs  Lab 05/18/17 0501 05/19/17 0455 05/20/17 0500 05/21/17 0906 05/22/17 0305  ALBUMIN 1.5* 1.5* 1.4* 1.6* 1.6*   No results for input(s): LIPASE, AMYLASE in the last 168 hours. No results for input(s): AMMONIA in the last 168 hours. Coagulation Profile: No results for input(s): INR, PROTIME in the last 168 hours. Cardiac Enzymes: No results for input(s): CKTOTAL, CKMB, CKMBINDEX, TROPONINI in the last 168 hours. BNP (last 3 results) No results for input(s): PROBNP in the last 8760 hours. HbA1C: No results for input(s): HGBA1C in the last 72 hours. CBG: Recent Labs  Lab 05/21/17 1929 05/22/17 0003 05/22/17 0340 05/22/17 0758 05/22/17 1155  GLUCAP 102* 120* 108* 112* 120*   Lipid Profile: No results for input(s): CHOL, HDL, LDLCALC, TRIG, CHOLHDL, LDLDIRECT in the last 72 hours. Thyroid Function Tests: No results for input(s): TSH, T4TOTAL, FREET4, T3FREE, THYROIDAB in the last 72 hours. Anemia Panel: No results for input(s): VITAMINB12, FOLATE, FERRITIN, TIBC, IRON, RETICCTPCT in the last 72 hours. Urine analysis:    Component Value Date/Time   COLORURINE AMBER (A) 05/01/2017 1313   APPEARANCEUR TURBID (A) 05/01/2017 1313   LABSPEC 1.014 05/01/2017 1313   PHURINE 5.0 05/01/2017 1313   GLUCOSEU 50 (A) 05/01/2017 1313   HGBUR MODERATE (A) 05/01/2017 1313   BILIRUBINUR NEGATIVE 05/01/2017 1313   KETONESUR NEGATIVE 05/01/2017 1313   PROTEINUR 100 (A) 05/01/2017 1313   NITRITE NEGATIVE 05/01/2017 1313   LEUKOCYTESUR MODERATE (A) 05/01/2017 1313   Sepsis Labs: @LABRCNTIP (procalcitonin:4,lacticidven:4)  )No results found for this or any previous visit (from the past 240 hour(s)).    Radiology Studies: No results found.   Scheduled Meds: . aspirin  81 mg Per Tube Daily  . chlorhexidine gluconate (MEDLINE KIT)   15 mL Mouth Rinse BID  . free water  400 mL Per Tube 6 X Daily  . haloperidol  1 mg Oral Daily  . haloperidol  2 mg Oral QHS  . insulin aspart  2-6 Units Subcutaneous Q4H  . ipratropium-albuterol  3 mL Nebulization Q6H  . mouth rinse  15 mL Mouth Rinse QID  . Melatonin  6 mg Oral QHS  . pantoprazole sodium  40 mg Per Tube QHS  . silver sulfADIAZINE   Topical Daily  . sodium chloride flush  10-40 mL Intracatheter Q12H  . thiamine  100 mg Per Tube Daily   Continuous Infusions: . feeding supplement (VITAL AF 1.2 CAL) 1,000 mL (05/21/17 1400)  . heparin 1,600 Units/hr (05/22/17 0600)     LOS: 21 days   Time Spent in minutes   30 minutes  Magic Mohler D.O. on 05/22/2017 at 1:34 PM  Between 7am to 7pm - Pager - [phone]  After 7pm go to [email or web address] - password [email or web address]  And look for the night coverage person covering for me after hours  [facility] Office  [phone]

## 2017-05-22 NOTE — Progress Notes (Addendum)
2:54pm-CSW spoke with pt's sister outside of room. CSW provided pt's sister [place] [place] start [place]. Sister to follow up with CSW if any further assistance is needed with Medicaid at this time.   CSW went to speak with sister Eber JonesCarolyn at bedside as requested of CSW on Monday. CSW was informed by Denice pt's ex wife that Eber JonesCarolyn has not been here and that Denice wasn't aware of this meeting as Eber Jonesarolyn didn't inform her. CSW expressed apologized for ex wife not being included in this meeting. Eber JonesCarolyn was not at bedside and was unable to be reached when called. CSW will continue to follow up with Eber Jonesarolyn about Medicaid application as this was the topic for speaking with CSW.     Claude MangesKierra S. Gilbert Manolis, MSW, LCSW-A Emergency Department Clinical Social Worker [phone]

## 2017-05-22 NOTE — Progress Notes (Signed)
ANTICOAGULATION CONSULT NOTE  Pharmacy Consult for Heparin  Indication: chest pain/ACS/DVT/?PE  No Known Allergies  Patient Measurements: Height: 5\' 10"  (177.8 cm) Weight: 188 lb 11.4 oz (85.6 kg) IBW/kg (Calculated) : 73  Vital Signs: Temp: 99 F (37.2 C) (03/06 0342) Temp Source: Oral (03/06 0342) BP: 142/75 (03/06 0600) Pulse Rate: 88 (03/06 0600)  Labs: Recent Labs    05/20/17 0500 05/21/17 0906 05/22/17 0305  HGB 7.8* 8.4* 8.4*  HCT 25.4* 27.0* 27.3*  PLT 334 355 376  HEPARINUNFRC 0.51 0.54 0.61  CREATININE 2.76* 2.22* 2.08*   Assessment: [name] in the ICU after being "found down".Patient has been found to have bilateral lower extremity DVTs with concern for possible PE. Heparin drip held 3/1 evening due to bleeding around PEG site - resumed on 3/2 as this was resolved.   Heparin level this morning remains therapeutic (HL 0.61, goal of 0.3-0.7). Hgb/Hct/Plt stable - no further bleeding around PEG site noted per RN report.   Goal of Therapy:  Heparin level 0.3-0.7 units/ml Monitor platelets by anticoagulation protocol: Yes   Plan:  Continue Heparin at 1600 units/hr (16 ml/hr) Will continue to monitor for any signs/symptoms of bleeding and will follow up with heparin level in the a.m.   Thank you for allowing pharmacy to be a part of this patient's care.   Diana L. Marcy Salvoaymond, PharmD, MS PGY1 Pharmacy Resident Pager: [phone]

## 2017-05-22 NOTE — Progress Notes (Signed)
CSW aware that pt remains on 28 percent trach collar at this time. CSW still following for further plans with pt and potential amputation of toes/leg (if needed). CSW is scheduled to meet with pt's sister today at 2pm to speak with her about Medicaid process. CSW still following at this time for further needs.      Claude MangesKierra S. Marygrace Sandoval, MSW, LCSW-A Emergency Department Clinical Social Worker [phone]

## 2017-05-22 NOTE — Evaluation (Signed)
Clinical/Bedside Swallow Evaluation Patient Details  Name: Joseph Downs MRN: [id number] Date of Birth: August 31, 1950  Today's Date: 05/22/2017 Time: SLP Start Time (ACUTE ONLY): [id number] SLP Stop Time (ACUTE ONLY): 0900 SLP Time Calculation (min) (ACUTE ONLY): 23 min  Past Medical History: No past medical history on file. Past Surgical History:  Past Surgical History:  Procedure Laterality Date  . ESOPHAGOGASTRODUODENOSCOPY N/A 05/16/2017   Procedure: ESOPHAGOGASTRODUODENOSCOPY (EGD);  Surgeon: Violeta Gelinashompson, Burke, MD;  [facility];  Service: Endoscopy;  Laterality: N/A;  . IR IVC FILTER PLMT / S&I Lenise Arena/IMG GUID/MOD SED  05/10/2017  . PEG PLACEMENT N/A 05/16/2017   Procedure: PERCUTANEOUS ENDOSCOPIC GASTROSTOMY (PEG) REPLACEMENT;  Surgeon: Violeta Gelinashompson, Burke, MD;  [facility];  Service: Endoscopy;  Laterality: N/A;   HPI:  67 year old homeless man found down/13 with hypothermia, shock lactic acidosis. 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 HehadAcinetobacter in blood and staph in urine with elevated PSA. Unable to be weaned from vent (ETT 2/13-2/27), trach placed 2/27, PEG 2/28.    Assessment / Plan / Recommendation Clinical Impression  Bedside swallow evaluation complete with goal to determine readiness for instrumental exam given high risk of dysphagia and aspiration with prolonged intubation and eventual tracheostomy. Patient alert and cooperative but requires cueing for sustained attention and consistent carryout of 1-step directions. Patient able to consume ice chips with appropriate oral transit of bolus, consistent initiation of swallow, and no overt s/s of aspiration although ability to fully assess vocal quality post swallow limited due to inability to consistently tolerate PMV. If cognitive status and level of alertness continues to be stable, would consider FEES 3/7 with or without PMV to determine ability to initiate a po diet.  SLP Visit Diagnosis:  Dysphagia, unspecified (R13.10)    Aspiration Risk  Severe aspiration risk    Diet Recommendation NPO   Medication Administration: Via alternative means    Other  Recommendations Oral Care Recommendations: Oral care QID   Follow up Recommendations (TBD)        Swallow Study   General HPI: 67 year old homeless man found down/13 with hypothermia, shock lactic acidosis. 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 HehadAcinetobacter in blood and staph in urine with elevated PSA. Unable to be weaned from vent (ETT 2/13-2/27), trach placed 2/27, PEG 2/28.  Type of Study: Bedside Swallow Evaluation Previous Swallow Assessment: none Diet Prior to this Study: NPO;PEG tube Temperature Spikes Noted: No Respiratory Status: Trach;Trach Collar Trach Size and Type: #6;Cuff;With PMSV in place;With PMSV not in place History of Recent Intubation: Yes Length of Intubations (days): 14 days Date extubated: 06/12/17 Behavior/Cognition: Alert;Cooperative;Pleasant mood;Confused;Requires cueing Oral Cavity Assessment: Within Functional Limits Oral Care Completed by SLP: Yes Oral Cavity - Dentition: Edentulous Vision: Functional for self-feeding Self-Feeding Abilities: Needs assist Patient Positioning: Upright in bed Baseline Vocal Quality: Breathy;Low vocal intensity(with pmv in place`) Volitional Cough: Strong Volitional Swallow: Able to elicit    Oral/Motor/Sensory Function Overall Oral Motor/Sensory Function: Within functional limits   Ice Chips Ice chips: Within functional limits Presentation: Spoon   Thin Liquid Thin Liquid: Not tested    Nectar Thick Nectar Thick Liquid: Not tested   Honey Thick Honey Thick Liquid: Not tested   Puree Puree: Not tested   Solid   Evyn Putzier MA, CCC-SLP [phone]    Solid: Not tested        Mandolin Falwell [name] 05/22/2017,9:15 AM

## 2017-05-22 NOTE — Progress Notes (Addendum)
Physical Therapy Treatment Patient Details Name: Joseph Downs MRN: [id number] DOB: [birth year] Today's Date: 05/22/2017    History of Present Illness This is a 67 year old man who was found down, unresponsive and hypothermic; Critical care service is asked to admit the patient because he still had a lactate acid in excess of 6 after being warmed and fluid resuscitated. Resp insufficiency, and intubated 2/13; Encephalopathy, NSTEMI, Acute Renal Failure; Bil DVTs, massive PE; shock, Bacteremia; IVC filter placed 2/22; Extubated 2/26, re-intubated later that day; received trach 2/27; received PEG 2/28; tolerated trach off vent most of 3/2    PT Comments    Pt admitted with above diagnosis. Pt currently with functional limitations due to balance and endurance deficits. Pt needing max to total assist for bed mobility and transfer anterior posterior to chair. Pt was able to sit EOB for 5 min with min guard assist which was better than last treatment. Will follow acutely. Pt will benefit from skilled PT to increase their independence and safety with mobility to allow discharge to the venue listed below.     Follow Up Recommendations  SNF;LTACH;Other (comment)(Noted SW's efforts in communcating with [facility]-Thanks!)     Equipment Recommendations  Other (comment)(to be determined)    Recommendations for Other Services       Precautions / Restrictions Precautions Precautions: Fall Required Braces or Orthoses: Other Brace/Splint Other Brace/Splint: B Prevalon boots Restrictions Weight Bearing Restrictions: No RLE Weight Bearing: Non weight bearing LLE Weight Bearing: Non weight bearing Other Position/Activity Restrictions: Frostbite bilateral feet;  NWB bil feet    Mobility  Bed Mobility Overal bed mobility: Needs Assistance Bed Mobility: Supine to Sit;Sit to Supine     Supine to sit: Max assist;HOB elevated Sit to supine: +2 for physical assistance;Max assist   General bed mobility  comments: Assist for LEs and elevation of trunk  Transfers Overall transfer level: Needs assistance   Transfers: Anterior-Posterior Transfer       Anterior-Posterior transfers: Total assist;Max assist;+2 physical assistance   General transfer comment: pt attempted to assist leaning forward and using UEs to unweight buttocks but needed assist and cues with use of pad  Ambulation/Gait                 Stairs            Wheelchair Mobility    Modified Rankin (Stroke Patients Only)       Balance Overall balance assessment: Needs assistance Sitting-balance support: Bilateral upper extremity supported;Feet unsupported Sitting balance-Leahy Scale: Fair Sitting balance - Comments: Sat EOB with feet off floor with min guard assist for 5 min.  Had Ue support at times.                                     Cognition Arousal/Alertness: Awake/alert Behavior During Therapy: WFL for tasks assessed/performed Overall Cognitive Status: Difficult to assess                                 General Comments: pt following commands, yes/no appears to be reliable, pt with poor safety and awareness of deficits      Exercises      General Comments General comments (skin integrity, edema, etc.): VSS with trach collar at 28%      Pertinent Vitals/Pain Pain Assessment: Faces Faces Pain Scale: Hurts even more Pain Location:  bil LEs when moved Pain Descriptors / Indicators: Grimacing;Moaning Pain Intervention(s): Limited activity within patient's tolerance;Monitored during session;Premedicated before session;Repositioned    Home Living                      Prior Function            PT Goals (current goals can now be found in the care plan section) Acute Rehab PT Goals Patient Stated Goal: unable to state Progress towards PT goals: Progressing toward goals    Frequency    Min 2X/week      PT Plan Current plan remains appropriate     Co-evaluation              AM-PAC PT "6 Clicks" Daily Activity  Outcome Measure  Difficulty turning over in bed (including adjusting bedclothes, sheets and blankets)?: Unable Difficulty moving from lying on back to sitting on the side of the bed? : Unable Difficulty sitting down on and standing up from a chair with arms (e.g., wheelchair, bedside commode, etc,.)?: Unable Help needed moving to and from a bed to chair (including a wheelchair)?: Total Help needed walking in hospital room?: Total Help needed climbing 3-5 steps with a railing? : Total 6 Click Score: 6    End of Session Equipment Utilized During Treatment: Oxygen(trach collar) Activity Tolerance: Patient limited by fatigue Patient left: in chair;with call bell/phone within reach;with family/visitor present(ex wife came in mid treatment) Nurse Communication: Mobility status;Need for lift equipment PT Visit Diagnosis: Muscle weakness (generalized) (M62.81);Difficulty in walking, not elsewhere classified (R26.2);Adult, failure to thrive (R62.7)     Time: 0852-0917 PT Time Calculation (min) (ACUTE ONLY): 25 min  Charges:  $Therapeutic Activity: 23-37 mins                    G Codes:       Joseph Downs,PT Acute Rehabilitation [phone] (pager)    Joseph Downs 05/22/2017, 1:31 PM

## 2017-05-22 NOTE — Progress Notes (Signed)
  Speech Language Pathology Treatment: Joseph Downs Speaking valve  Patient Details Name: Joseph Downs MRN: [id number] DOB: 03/07/1951 Today's Date: 05/22/2017 Time: 0837-0900 SLP Time Calculation (min) (ACUTE ONLY): 23 min  Assessment / Plan / Recommendation Clinical Impression  Cuff deflated at baseline and patient tolerating well with no evidence of distress and stable vital signs. Secretions minimal. Valve placed and patient with initial tolerance of valve and production of low intensity, breathy, but intelligible verbal output at the word and short phrase level. Following approximately 5 minutes of use, patient without change in vital signs but with increased use of accessory muscles, audible expiratory wheeze, and increase in breathiness of phonatory output. Valve removed without obvious air trapping although subsequent trials of valve placement, even after RN deep suctioning (without removal of significant secretions), produced similar residuals. Question subtle decrease in exhalation of CO2 over time as secretions minimal and do not appear to be impacting function. Size of trach, presence or deflated cuff, and/or upper airway edema s/p prolonged intubation and trach placement may all be impacting upper airway patency. Continue to recommend PMV with SLP only.    HPI HPI: 67 year old homeless man found down/13 with hypothermia, shock lactic acidosis. 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 HehadAcinetobacter in blood and staph in urine with elevated PSA. Unable to be weaned from vent (ETT 2/13-2/27), trach placed 2/27, PEG 2/28.       SLP Plan  Continue with current plan of care       Recommendations         Patient may use Passy-Downs Speech Valve: with SLP only MD: Please consider changing trach tube to : Cuffless;Smaller size         Oral Care Recommendations: Oral care QID Plan: Continue with current plan of care                    [facility] Jacy Brocker  MA, CCC-SLP [phone]    Joseph Downs Joseph Downs 05/22/2017, 9:02 AM

## 2017-05-23 ENCOUNTER — Other Ambulatory Visit: Payer: Self-pay

## 2017-05-23 ENCOUNTER — Inpatient Hospital Stay (HOSPITAL_COMMUNITY): Payer: Medicare Other

## 2017-05-23 LAB — CBC
HEMATOCRIT: 29 % — AB (ref 39.0–52.0)
Hemoglobin: 9.1 g/dL — ABNORMAL LOW (ref 13.0–17.0)
MCH: 29.5 pg (ref 26.0–34.0)
MCHC: 31.4 g/dL (ref 30.0–36.0)
MCV: 94.2 fL (ref 78.0–100.0)
Platelets: 330 10*3/uL (ref 150–400)
RBC: 3.08 MIL/uL — ABNORMAL LOW (ref 4.22–5.81)
RDW: 14.8 % (ref 11.5–15.5)
WBC: 13.2 10*3/uL — ABNORMAL HIGH (ref 4.0–10.5)

## 2017-05-23 LAB — GLUCOSE, CAPILLARY
Glucose-Capillary: 114 mg/dL — ABNORMAL HIGH (ref 65–99)
Glucose-Capillary: 119 mg/dL — ABNORMAL HIGH (ref 65–99)
Glucose-Capillary: 108 mg/dL — ABNORMAL HIGH (ref 65–99)
Glucose-Capillary: 99 mg/dL (ref 65–99)
Glucose-Capillary: 118 mg/dL — ABNORMAL HIGH (ref 65–99)
Glucose-Capillary: 113 mg/dL — ABNORMAL HIGH (ref 65–99)

## 2017-05-23 LAB — RENAL FUNCTION PANEL
Albumin: 1.7 g/dL — ABNORMAL LOW (ref 3.5–5.0)
Anion gap: 8 (ref 5–15)
BUN: 45 mg/dL — AB (ref 6–20)
CHLORIDE: 116 mmol/L — AB (ref 101–111)
CO2: 21 mmol/L — AB (ref 22–32)
CREATININE: 1.89 mg/dL — AB (ref 0.61–1.24)
Calcium: 7.9 mg/dL — ABNORMAL LOW (ref 8.9–10.3)
GFR calc Af Amer: 41 mL/min — ABNORMAL LOW (ref >60)
GFR, EST NON AFRICAN AMERICAN: 35 mL/min — AB (ref >60)
GLUCOSE: 117 mg/dL — AB (ref 65–99)
Phosphorus: 3.3 mg/dL (ref 2.5–4.6)
Potassium: 4.3 mmol/L (ref 3.5–5.1)
Sodium: 145 mmol/L (ref 135–145)

## 2017-05-23 LAB — HEPARIN LEVEL (UNFRACTIONATED): HEPARIN UNFRACTIONATED: 0.45 [IU]/mL (ref 0.30–0.70)

## 2017-05-23 MED ORDER — FREE WATER
250.0000 mL | Freq: Three times a day (TID) | Status: DC
  Administered 2017-05-23 (×2): 250 mL

## 2017-05-23 MED ORDER — IPRATROPIUM-ALBUTEROL 0.5-2.5 (3) MG/3ML IN SOLN
3.0000 mL | Freq: Four times a day (QID) | RESPIRATORY_TRACT | Status: DC
  Administered 2017-05-24: 3 mL via RESPIRATORY_TRACT
  Filled 2017-05-23: qty 3

## 2017-05-23 MED ORDER — HALOPERIDOL LACTATE 5 MG/ML IJ SOLN
5.0000 mg | Freq: Once | INTRAMUSCULAR | Status: AC
  Administered 2017-05-23: 5 mg via INTRAVENOUS
  Filled 2017-05-23: qty 1

## 2017-05-23 MED ORDER — OLANZAPINE 10 MG PO TABS
10.0000 mg | ORAL_TABLET | Freq: Once | ORAL | Status: AC
  Administered 2017-05-24: 10 mg via ORAL
  Filled 2017-05-23 (×2): qty 1

## 2017-05-23 NOTE — Progress Notes (Signed)
  Speech Language Pathology   Patient Details Name: Melonie FloridaLarry E Hua MRN: [id number] DOB: [birth year] Today's Date: 05/23/2017 Time:  -     Spoke with RN who reported pt is scheduled to have bilateral AKA's tomorrow. Recommend to defer FEES/MBS until after surgery given intubation for surgery (albeit short term), anethesia and general further deconditioning. Will check pt first of next week for readiness for objective swallow assessment.                              [name], Fergus Throne Willis 05/23/2017, 1:30 PM  Breck CoonsLisa Willis Lonell FaceLitaker M.Ed [employer]-SLP Pager [phone]

## 2017-05-23 NOTE — Progress Notes (Signed)
Nutrition Follow-up  DOCUMENTATION CODES:   Non-severe (moderate) malnutrition in context of social or environmental circumstances  INTERVENTION:    Continue Vital AF 1.2 at 75 ml/h at 15:45 to provide 2160 kcal, 135 gm protein, 1460 ml free water daily  If pt continues to require TF post-op, recommend:  Initiate Jevity 1.5 @ 20 ml/hr via PEG and increase by 10 ml every 4 hours to goal rate of 50 ml/hr.   30 ml Prostat BID.    Tube feeding regimen provides 2000 kcal (93% of needs), 107 grams of protein, and 1003 ml of H2O.   NUTRITION DIAGNOSIS:   Moderate Malnutrition related to social / environmental circumstances as evidenced by mild fat depletion, mild muscle depletion.  Ongoing  GOAL:   Patient will meet greater than or equal to 90% of their needs  Met with TF  MONITOR:   Diet advancement, Labs, Weight trends, Skin, I & O's, TF tolerance  REASON FOR ASSESSMENT:   Consult Enteral/tube feeding initiation and management  ASSESSMENT:   67 yo male with no PMH who was admitted on 2/13 after being found unresponsive outside by police with hypothermia, hypotension, elevated lactic acid. Required intubation on admission. Found to have extensive bilateral DVTs.  11/27- s/p trach 2/28- s/p PEG placement 3/6- transferred from ICU to SDU  Pt on trach collar.   Reviewed I/O's: +636 ml x 24 hours and +8.9 L since admission.   Case discussed with SLP, who confirms plan for FEES. However FEES on hold today, as pt will undergo bilateral AKA tomorrow. Per vascular surgery notes, confirm plan for surgery tomorrow and hold TF after midnight. Pt currently NPO and TF dependent; Vital AF 1.2 infusing at goal rate of 75 ml/hr via PEG. Complete regimen provides 2160 kcals, 135 grams protein, and 1460 ml free water daily (Total free water: 2210 ml daily), which meets 100% of estimated nutritional needs.   Pt able to track this RD and answer close ended questions using hand gestures.  He reports feeling better today. He denies any abdominal pain.   Labs reviewed: CBGS: [phone] (inpatient orders for glycemic control are 2-6 units insulin aspart every 4 hours). K, Mg, and Phos WDL.   NUTRITION - FOCUSED PHYSICAL EXAM:    Most Recent Value  Orbital Region  Moderate depletion  Upper Arm Region  Mild depletion  Thoracic and Lumbar Region  Unable to assess  Buccal Region  Moderate depletion  Temple Region  Mild depletion  Clavicle Bone Region  Mild depletion  Clavicle and Acromion Bone Region  Mild depletion  Scapular Bone Region  Mild depletion  Dorsal Hand  Unable to assess  Patellar Region  Mild depletion  Anterior Thigh Region  Mild depletion  Posterior Calf Region  Unable to assess  Edema (RD Assessment)  None  Hair  Reviewed  Eyes  Reviewed  Mouth  Reviewed  Skin  Reviewed  Nails  Reviewed       Diet Order:  Diet NPO time specified Except for: Sips with Meds  EDUCATION NEEDS:   No education needs have been identified at this time  Skin:  Skin Assessment: Skin Integrity Issues: Skin Integrity Issues:: Stage II, Other (Comment) Stage II: coccyx Other: gangrenous toes from frostbite   Last BM:  05/23/17  Height:   Ht Readings from Last 1 Encounters:  05/14/17 5' 10"  (1.778 m)    Weight:   Wt Readings from Last 1 Encounters:  05/22/17 188 lb 11.4 oz (85.6 kg)  Ideal Body Weight:  75.5 kg  BMI:  Body mass index is 27.08 kg/m.  Estimated Nutritional Needs:   Kcal:  2150-2350  Protein:  125-140 grams  Fluid:  > 2.1 L    Joseph Downs, RD, LDN, CDE Pager: [phone] After hours Pager: [phone]

## 2017-05-23 NOTE — Progress Notes (Signed)
Patient found down at 1615 by NT on floor with an unwitnessed fall. Unsure if patient hit his head, no apparent signs of injury. Mikhail, MD notified at 1630. STAT head CT ordered. Patient's sister Eber JonesCarolyn called at [phone], RN attempted to tell family of fall and family requested that she call RN back and ended call. RN will reattempt to call family. Patient high fall risk. Low bed ordered and fall mats placed on floor. VSS and neuro assessment unchanged. Bed alarm on.

## 2017-05-23 NOTE — Progress Notes (Signed)
  Speech Language Pathology Treatment: Dysphagia;Passy Muir Speaking valve  Patient Details Name: Joseph FloridaLarry E Kiger MRN: [id number] DOB: 11-08-1950 Today's Date: 05/23/2017 Time: [id number] SLP Time Calculation (min) (ACUTE ONLY): 18 min  Assessment / Plan / Recommendation Clinical Impression  Pt alert and tolerated PMV for intervals of 3 minutes over approximately 12 minute period. Mild air trapping evident initially increasing in frequency and severity throughout session with RR ranging 21-44 breaths per minute. Hoarse quality and low vocal intensity. Valve removed and initiated ice chip trials; delayed oral transit and suspected delayed swallow initiation. Pt ready for objective swallow assessment with FEES recommended. Voicemail left with sister, Eber JonesCarolyn to obtain consent for procedure and will perform FEES once received. PMV with SLP only.   HPI HPI: 67 year old homeless man found down/13 with hypothermia, shock lactic acidosis. 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 HehadAcinetobacter in blood and staph in urine with elevated PSA. Unable to be weaned from vent (ETT 2/13-2/27), trach placed 2/27, PEG 2/28.       SLP Plan  Continue with current plan of care(instrumental swallow assessment)       Recommendations  Diet recommendations: NPO Medication Administration: Via alternative means      Patient may use Passy-Muir Speech Valve: with SLP only PMSV Supervision: Full MD: Please consider changing trach tube to : Cuffless;Smaller size         Oral Care Recommendations: Oral care QID Follow up Recommendations: (TBD) SLP Visit Diagnosis: Dysphagia, unspecified (R13.10) Plan: Continue with current plan of care(instrumental swallow assessment)       GO                [name], Heber Hoog Willis 05/23/2017, 12:38 PM  Breck CoonsLisa Willis Lonell FaceLitaker M.Ed [employer]SL[employer] Pager [phone]

## 2017-05-23 NOTE — Progress Notes (Signed)
Patient's sister, Eber JonesCarolyn updated on patient fall and patient condition at [id number].

## 2017-05-23 NOTE — Progress Notes (Addendum)
PROGRESS NOTE    Joseph Downs  ZOX:[id number] DOB: Nov 10, 1950 DOA: 05/01/2017 PCP: Patient, No Pcp Per   Brief Narrative:  Mr. Dripps is a 67 yo M, reportedly homeless, no past medical history that I can find from his recent admission to our service other than obstructive uropathy requiring foley.  Evidently, on 2/13, he was found down in the cold.  Brought to the ER, hypotensive, had Lactate >7 despite 30 cc/kg IVF and re-warming, was admitted to ICU, at that time was mentating enough to explain that he had passed out, not clear why.  That night, developed respiratory distress requiring intubation and hypotension. Following that, was treated with broad spectrum antibiotics for hypotension, tachycardia and leukocytosis. Renal failure, creatinine up to 7. Started on CVVHD. Blood cultures grew Acineto lwoffii, Urine cx frew staph lugdunensis.  Echo showed severe RV strain and bilateral LE dopplers showed extensive DVT.  CTA not possible, nor catheterization/lysis due to AKI.  Started on heparin.  At time of transfer out of ICU to [facility] service, patient has tolerated 2 days intermittent trach collar, back on vent at night.  Has PEG.  Cardiology, Nephrology, Vascular surgery, and Pulm are following.  Assessment & Plan   Acute respiratory failure -PCCM follow as patient has trach collar  Presumed massive pulmonary embolism/Bilateral DVT -Status post IVC filter-placed due to doubts of patient's future compliance with oral anticoagulation -Continue heparin  Acute kidney injury -History of recent obstructive uropathy. ?  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)- needs an outpatient work up -She did have a Foley catheter previous hospitalization -Creatinine currently appears to be improving -Nephrology consulted and appreciated and is now signed off  Bacteremia -Cultures from 05/01/2017 showed diphtheroids, Acinetobacter lwoffii -Repeat blood  cultures show no growth -Patient was placed on vancomycin and Zosyn however then transitioned to meropenem  Hyponatremia -Continue free water, monitor BMP  Acute metabolic encephalopathy -Appears to be improving slowly -Avoid benzodiazepines -continue Haldol  NSTEMI -Cardiology consulted and appreciated, continue aspirin -Possibly related to PE  Normocytic Anemia -Unclear etiology -Was transfused 1 unit PRBC earlier this hospitalization -Hemoglobin currently 9.1, remaining stable -Continue to monitor CBC  Presumed frostbite/Gangrene of feet -Vascular surgery consulted and appreciated -Wound care consulted -Patient may need bilateral lower extremity amputations -discussed with Dr. Scot Dock, vascular, he will reach to patient's sister today to discuss options as patient currently is encephalopathic  DVT Prophylaxis heparin  Code Status: Full  Family Communication: None at bedside.  Discussed with sister on 05/22/2017 via phone.  Disposition Plan: admitted. Dispo pending- need decision regarding possible amputation   Consultants PCCM Nephrology Cardiology Vascular surgery  Procedures  Intubated/extubated on 05/01/2017 through 05/15/2017 IVC filter placement 05/10/2017 Trach placed on 05/15/2017 PEG placed 05/16/2017  Antibiotics   Anti-infectives (From admission, onward)   Start     Dose/Rate Route Frequency Ordered Stop   05/07/17 2200  meropenem (MERREM) 500 mg in sodium chloride 0.9 % 100 mL IVPB     500 mg 200 mL/hr over 30 Minutes Intravenous Every 24 hours 05/07/17 1306 05/19/17 1911   05/07/17 0930  meropenem (MERREM) 1 g in sodium chloride 0.9 % 100 mL IVPB  Status:  Discontinued     1 g 200 mL/hr over 30 Minutes Intravenous Every 12 hours 05/07/17 0908 05/07/17 1306   05/06/17 1300  meropenem (MERREM) 500 mg in sodium chloride 0.9 % 100 mL IVPB  Status:  Discontinued     500 mg 200 mL/hr  over 30 Minutes Intravenous Every 24 hours 05/06/17 1146 05/07/17 0908    05/04/17 2200  vancomycin (VANCOCIN) IVPB 1000 mg/200 mL premix     1,000 mg 200 mL/hr over 60 Minutes Intravenous Every 24 hours 05/04/17 0933 05/07/17 2339   05/04/17 1400  piperacillin-tazobactam (ZOSYN) IVPB 3.375 g  Status:  Discontinued     3.375 g 100 mL/hr over 30 Minutes Intravenous Every 6 hours 05/04/17 0932 05/06/17 1146   05/03/17 1300  vancomycin (VANCOCIN) IVPB 1000 mg/200 mL premix  Status:  Discontinued     1,000 mg 200 mL/hr over 60 Minutes Intravenous Every 48 hours 05/02/17 1340 05/04/17 0933   05/02/17 2000  piperacillin-tazobactam (ZOSYN) IVPB 2.25 g  Status:  Discontinued     2.25 g 100 mL/hr over 30 Minutes Intravenous Every 6 hours 05/02/17 1342 05/04/17 0932   05/02/17 1300  vancomycin (VANCOCIN) IVPB 1000 mg/200 mL premix  Status:  Discontinued     1,000 mg 200 mL/hr over 60 Minutes Intravenous Every 24 hours 05/01/17 1313 05/02/17 1340   05/01/17 2000  piperacillin-tazobactam (ZOSYN) IVPB 3.375 g  Status:  Discontinued     3.375 g 12.5 mL/hr over 240 Minutes Intravenous Every 8 hours 05/01/17 1313 05/02/17 1342   05/01/17 1200  vancomycin (VANCOCIN) 1,750 mg in sodium chloride 0.9 % 500 mL IVPB     1,750 mg 250 mL/hr over 120 Minutes Intravenous  Once 05/01/17 1121 05/01/17 1409   05/01/17 1130  piperacillin-tazobactam (ZOSYN) IVPB 3.375 g     3.375 g 100 mL/hr over 30 Minutes Intravenous  Once 05/01/17 1119 05/01/17 1217   05/01/17 1130  vancomycin (VANCOCIN) IVPB 1000 mg/200 mL premix  Status:  Discontinued     1,000 mg 200 mL/hr over 60 Minutes Intravenous  Once 05/01/17 1119 05/01/17 1121      Subjective:   [name] seen and examined today.  Patient has no complaints. Not very interactive.  Objective:   Vitals:   05/23/17 0706 05/23/17 0750 05/23/17 1124 05/23/17 1128  BP: 132/74 127/77 127/77   Pulse: 74 85 79   Resp: 12 (!) 22 15   Temp:  99.2 F (37.3 C)  99 F (37.2 C)  TempSrc:  Oral  Oral  SpO2: 100% 99% 100%   Weight:        Height:        Intake/Output Summary (Last 24 hours) at 05/23/2017 1154 Last data filed at 05/23/2017 0755 Gross per 24 hour  Intake 3095 ml  Output 2825 ml  Net 270 ml   Filed Weights   05/20/17 0500 05/21/17 0346 05/22/17 0500  Weight: 87.6 kg (193 lb 2 oz) 88.9 kg (195 lb 15.8 oz) 85.6 kg (188 lb 11.4 oz)   Exam  General: Well developed, well nourished, NAD  HEENT: NCAT, mucous membranes moist. Poor dentition.  Neck: Trach  Cardiovascular: S1 S2 auscultated, no murmurs, RRR  Respiratory: Clear to auscultation bilaterally with equal chest rise  Abdomen: Soft, nontender, nondistended, + bowel sounds, Peg in place  Extremities: warm dry without cyanosis clubbing. LLE  >RLE  Neuro: AAOx1 (self), nonfocal. Able to follow commands  Data Reviewed: I have personally reviewed following labs and imaging studies  CBC: Recent Labs  Lab 05/17/17 0630 05/17/17 2300  05/19/17 0455 05/20/17 0500 05/21/17 0906 05/22/17 0305 05/23/17 0315  WBC 15.6* 14.1*   < > 11.2* 9.6 13.3* 13.8* 13.2*  NEUTROABS 12.6* 11.0*  --   --   --   --   --   --  HGB 6.3* 6.9*   < > 8.7* 7.8* 8.4* 8.4* 9.1*  HCT 20.9* 22.8*   < > 27.9* 25.4* 27.0* 27.3* 29.0*  MCV 96.8 94.2   < > 95.2 96.2 95.7 94.1 94.2  PLT 409* 346   < > 359 334 355 376 330   < > = values in this interval not displayed.   Basic Metabolic Panel: Recent Labs  Lab 05/17/17 0500  05/19/17 0455 05/20/17 0500 05/21/17 0906 05/22/17 0305 05/23/17 0315  NA 159*   < > 154* 154* 150* 147* 145  K 4.2   < > 3.9 4.0 4.2 4.1 4.3  CL 125*   < > 126* 125* 123* 117* 116*  CO2 22   < > 21* 20* 19* 20* 21*  GLUCOSE 172*   < > 142* 134* 132* 135* 117*  BUN 104*   < > 81* 70* 57* 51* 45*  CREATININE 4.64*   < > 3.27* 2.76* 2.22* 2.08* 1.89*  CALCIUM 7.8*   < > 7.8* 7.7* 8.0* 8.0* 7.9*  MG 2.2  --   --   --   --   --   --   PHOS 5.7*   < > 4.6 4.1 3.1 2.6 3.3   < > = values in this interval not displayed.   GFR: Estimated Creatinine  Clearance: 39.7 mL/min (A) (by C-G formula based on SCr of 1.89 mg/dL (H)). Liver Function Tests: Recent Labs  Lab 05/19/17 0455 05/20/17 0500 05/21/17 0906 05/22/17 0305 05/23/17 0315  ALBUMIN 1.5* 1.4* 1.6* 1.6* 1.7*   No results for input(s): LIPASE, AMYLASE in the last 168 hours. No results for input(s): AMMONIA in the last 168 hours. Coagulation Profile: No results for input(s): INR, PROTIME in the last 168 hours. Cardiac Enzymes: No results for input(s): CKTOTAL, CKMB, CKMBINDEX, TROPONINI in the last 168 hours. BNP (last 3 results) No results for input(s): PROBNP in the last 8760 hours. HbA1C: No results for input(s): HGBA1C in the last 72 hours. CBG: Recent Labs  Lab 05/22/17 2146 05/22/17 2304 05/23/17 0423 05/23/17 0752 05/23/17 1130  GLUCAP 114* 121* 114* 119* 108*   Lipid Profile: No results for input(s): CHOL, HDL, LDLCALC, TRIG, CHOLHDL, LDLDIRECT in the last 72 hours. Thyroid Function Tests: No results for input(s): TSH, T4TOTAL, FREET4, T3FREE, THYROIDAB in the last 72 hours. Anemia Panel: No results for input(s): VITAMINB12, FOLATE, FERRITIN, TIBC, IRON, RETICCTPCT in the last 72 hours. Urine analysis:    Component Value Date/Time   COLORURINE AMBER (A) 05/01/2017 1313   APPEARANCEUR TURBID (A) 05/01/2017 1313   LABSPEC 1.014 05/01/2017 1313   PHURINE 5.0 05/01/2017 1313   GLUCOSEU 50 (A) 05/01/2017 1313   HGBUR MODERATE (A) 05/01/2017 1313   BILIRUBINUR NEGATIVE 05/01/2017 1313   KETONESUR NEGATIVE 05/01/2017 1313   PROTEINUR 100 (A) 05/01/2017 1313   NITRITE NEGATIVE 05/01/2017 1313   LEUKOCYTESUR MODERATE (A) 05/01/2017 1313   Sepsis Labs: @LABRCNTIP (procalcitonin:4,lacticidven:4)  )No results found for this or any previous visit (from the past 240 hour(s)).    Radiology Studies: No results found.   Scheduled Meds: . aspirin  81 mg Per Tube Daily  . chlorhexidine gluconate (MEDLINE KIT)  15 mL Mouth Rinse BID  . free water  250 mL  Per Tube Q8H  . haloperidol  1 mg Oral Daily  . haloperidol  2 mg Oral QHS  . insulin aspart  2-6 Units Subcutaneous Q4H  . ipratropium-albuterol  3 mL Nebulization Q6H  . mouth rinse  15 mL Mouth Rinse QID  . Melatonin  6 mg Oral QHS  . pantoprazole sodium  40 mg Per Tube QHS  . silver sulfADIAZINE   Topical Daily  . sodium chloride flush  10-40 mL Intracatheter Q12H  . thiamine  100 mg Per Tube Daily   Continuous Infusions: . feeding supplement (VITAL AF 1.2 CAL) 1,000 mL (05/23/17 0600)  . heparin 1,600 Units/hr (05/23/17 0600)     LOS: 22 days   Time Spent in minutes   30 minutes  Norvell Ureste D.O. on 05/23/2017 at 11:54 AM  Between 7am to 7pm - Pager - [phone]  After 7pm go to [email or web address] - password [email or web address]  And look for the night coverage person covering for me after hours  [facility] Office  [phone]

## 2017-05-23 NOTE — Progress Notes (Addendum)
ANTICOAGULATION CONSULT NOTE  Pharmacy Consult for Heparin  Indication: chest pain/ACS/DVT/?PE  No Known Allergies  Patient Measurements: Height: 5\' 10"  (177.8 cm) Weight: 188 lb 11.4 oz (85.6 kg) IBW/kg (Calculated) : 73  Heparin dosing weight: 83.7 kg  Vital Signs: Temp: 99.2 F (37.3 C) (03/07 0750) Temp Source: Oral (03/07 0750) BP: 127/77 (03/07 0750) Pulse Rate: 85 (03/07 0750)  Labs: Recent Labs    05/21/17 0906 05/22/17 0305 05/23/17 0315  HGB 8.4* 8.4* 9.1*  HCT 27.0* 27.3* 29.0*  PLT 355 376 330  HEPARINUNFRC 0.54 0.61 0.45  CREATININE 2.22* 2.08* 1.89*   Assessment: [name] in the ICU after being "found down".Patient has been found to have bilateral lower extremity DVTs with concern for possible PE. Heparin drip held 3/1 evening due to bleeding around PEG site - resumed on 3/2 as this was resolved.   Heparin level this morning continues to be therapeutic (HL 0.45, goal of 0.3-0.7). Hgb remains stable at 9.1, plts WNL - only small amount of blood noticed around PEG site upon nursing handoff. No infusion issues overnight.   Goal of Therapy:  Heparin level 0.3-0.7 units/ml Monitor platelets by anticoagulation protocol: Yes   Plan:  Continue heparin at 1600 units/hr (16 ml/hr) Will continue to monitor for any signs/symptoms of bleeding and will follow up with heparin level in the a.m.  Follow up if plan for oral anticoagulation  Thank you for allowing pharmacy to be a part of this patient's care.  Girard CooterKimberly Perkins, PharmD Clinical Pharmacist  Pager: [phone] Clinical Phone for 05/23/2017 until 3:30pm: [phone] If after 3:30pm, please call main pharmacy at [phone]  [facility]  Plan for bilateral AKA tomorrow. Heparin scheduled to be stop on 3/8 at 1100 - order adjusted to reflect.  Girard CooterKimberly Perkins, PharmD Clinical Pharmacist  Pager: [phone] Phone: [phone]

## 2017-05-23 NOTE — Plan of Care (Signed)
Patient on Vital AF 1.2 at goal infusion rate.

## 2017-05-23 NOTE — Progress Notes (Signed)
   VASCULAR SURGERY ASSESSMENT & PLAN:   I have reexamined both of his feet today and he has full-thickness wounds essentially involving the circumference of both feet.  I do not think there is any chance of limb salvage on either side.  I have discuss the case with Dr. Ree Kida, the patient (who is much more alert today), and his sister Hoyle Sauer [phone]).  I think it would be best to proceed with bilateral above-the-knee amputations tomorrow and everyone is in agreement with this.  Patient seems to understand.  But I do not think that he would be a candidate for bilateral hip prostheses in in addition, there would be some risk of below the knee amputation is not healing.  On the left side he has an eschar which extends up his posterior calf and also has an extensive DVT.  We will hold his tube feeds after midnight and stop his heparin prior to surgery tomorrow.   SUBJECTIVE:   More alert today and does respond to questions appropriately.  PHYSICAL EXAM:   Vitals:   05/23/17 0706 05/23/17 0750 05/23/17 1124 05/23/17 1128  BP: 132/74 127/77 127/77   Pulse: 74 85 79   Resp: 12 (!) 22 15   Temp:  99.2 F (37.3 C)  99 F (37.2 C)  TempSrc:  Oral  Oral  SpO2: 100% 99% 100%   Weight:      Height:       Full-thickness wounds of both feet.  Neither foot appears to be salvageable. Moderate left lower extremity swelling from his extensive left lower extremity DVT.  LABS:   Lab Results  Component Value Date   WBC 13.2 (H) 05/23/2017   HGB 9.1 (L) 05/23/2017   HCT 29.0 (L) 05/23/2017   MCV 94.2 05/23/2017   PLT 330 05/23/2017   Lab Results  Component Value Date   CREATININE 1.89 (H) 05/23/2017   Lab Results  Component Value Date   INR 1.21 05/10/2017   CBG (last 3)  Recent Labs    05/23/17 0423 05/23/17 0752 05/23/17 1130  GLUCAP 114* 119* 108*    PROBLEM LIST:    Active Problems:   Lactic acidemia   Acute respiratory failure (HCC)   Exposure to weather  condition   Hypotension   Non-ST elevation (NSTEMI) myocardial infarction (HCC)   Malnutrition of moderate degree   Acute cystitis with hematuria   Sepsis ([place])   Acute deep vein thrombosis (DVT) of proximal vein of both lower extremities (HCC)   Pressure injury of skin   Cardiogenic shock (HCC)   Acute cor pulmonale (HCC)   Acute respiratory failure with hypoxemia (HCC)   CURRENT MEDS:   . aspirin  81 mg Per Tube Daily  . chlorhexidine gluconate (MEDLINE KIT)  15 mL Mouth Rinse BID  . free water  250 mL Per Tube Q8H  . haloperidol  1 mg Oral Daily  . haloperidol  2 mg Oral QHS  . insulin aspart  2-6 Units Subcutaneous Q4H  . ipratropium-albuterol  3 mL Nebulization Q6H  . mouth rinse  15 mL Mouth Rinse QID  . Melatonin  6 mg Oral QHS  . pantoprazole sodium  40 mg Per Tube QHS  . silver sulfADIAZINE   Topical Daily  . sodium chloride flush  10-40 mL Intracatheter Q12H  . thiamine  100 mg Per Tube Daily    Deitra Mayo Beeper: [phone] Office: [phone] 05/23/2017

## 2017-05-24 ENCOUNTER — Encounter (HOSPITAL_COMMUNITY)
Admission: EM | Disposition: A | Discharge status: Skilled Nursing Facility | Payer: Self-pay | Source: Home / Self Care | Attending: Emergency Medicine

## 2017-05-24 ENCOUNTER — Inpatient Hospital Stay (HOSPITAL_COMMUNITY): Payer: Medicare Other | Admitting: Anesthesiology

## 2017-05-24 ENCOUNTER — Encounter (HOSPITAL_COMMUNITY): Payer: Self-pay

## 2017-05-24 ENCOUNTER — Other Ambulatory Visit: Payer: Self-pay

## 2017-05-24 HISTORY — PX: AMPUTATION: SHX166

## 2017-05-24 LAB — CBC
HEMATOCRIT: 26.3 % — AB (ref 39.0–52.0)
Hemoglobin: 8.2 g/dL — ABNORMAL LOW (ref 13.0–17.0)
MCH: 29.5 pg (ref 26.0–34.0)
MCHC: 31.2 g/dL (ref 30.0–36.0)
MCV: 94.6 fL (ref 78.0–100.0)
PLATELETS: 350 10*3/uL (ref 150–400)
RBC: 2.78 MIL/uL — AB (ref 4.22–5.81)
RDW: 15 % (ref 11.5–15.5)
WBC: 12.9 10*3/uL — ABNORMAL HIGH (ref 4.0–10.5)

## 2017-05-24 LAB — RENAL FUNCTION PANEL
Albumin: 1.8 g/dL — ABNORMAL LOW (ref 3.5–5.0)
Anion gap: 11 (ref 5–15)
BUN: 37 mg/dL — AB (ref 6–20)
CHLORIDE: 113 mmol/L — AB (ref 101–111)
CO2: 23 mmol/L (ref 22–32)
CREATININE: 1.7 mg/dL — AB (ref 0.61–1.24)
Calcium: 8 mg/dL — ABNORMAL LOW (ref 8.9–10.3)
GFR calc Af Amer: 47 mL/min — ABNORMAL LOW (ref >60)
GFR calc non Af Amer: 40 mL/min — ABNORMAL LOW (ref >60)
Glucose, Bld: 99 mg/dL (ref 65–99)
POTASSIUM: 4.4 mmol/L (ref 3.5–5.1)
Phosphorus: 3.8 mg/dL (ref 2.5–4.6)
Sodium: 147 mmol/L — ABNORMAL HIGH (ref 135–145)

## 2017-05-24 LAB — GLUCOSE, CAPILLARY
GLUCOSE-CAPILLARY: 104 mg/dL — AB (ref 65–99)
GLUCOSE-CAPILLARY: 121 mg/dL — AB (ref 65–99)
Glucose-Capillary: 92 mg/dL (ref 65–99)
Glucose-Capillary: 103 mg/dL — ABNORMAL HIGH (ref 65–99)
Glucose-Capillary: 127 mg/dL — ABNORMAL HIGH (ref 65–99)
Glucose-Capillary: 128 mg/dL — ABNORMAL HIGH (ref 65–99)

## 2017-05-24 LAB — PREPARE RBC (CROSSMATCH)

## 2017-05-24 LAB — HEPARIN LEVEL (UNFRACTIONATED)

## 2017-05-24 SURGERY — AMPUTATION, ABOVE KNEE
Anesthesia: General | Site: Leg Upper | Laterality: Bilateral

## 2017-05-24 MED ORDER — SODIUM CHLORIDE 0.9 % IV SOLN
INTRAVENOUS | Status: DC
  Administered 2017-05-24: 14:00:00 via INTRAVENOUS

## 2017-05-24 MED ORDER — POTASSIUM CHLORIDE CRYS ER 20 MEQ PO TBCR: 20.0000 meq | EXTENDED_RELEASE_TABLET | Freq: Every day | ORAL | Status: DC | PRN

## 2017-05-24 MED ORDER — MEPERIDINE HCL 50 MG/ML IJ SOLN: 6.2500 mg | INTRAMUSCULAR | Status: DC | PRN

## 2017-05-24 MED ORDER — HYDROMORPHONE HCL 1 MG/ML IJ SOLN: 0.2500 mg | INTRAMUSCULAR | Status: DC | PRN

## 2017-05-24 MED ORDER — MAGNESIUM SULFATE 2 GM/50ML IV SOLN: 2.0000 g | Freq: Every day | INTRAVENOUS | Status: DC | PRN

## 2017-05-24 MED ORDER — ONDANSETRON HCL 4 MG/2ML IJ SOLN
4.0000 mg | Freq: Four times a day (QID) | INTRAMUSCULAR | Status: DC | PRN
  Filled 2017-05-24: qty 2

## 2017-05-24 MED ORDER — PHENYLEPHRINE HCL 10 MG/ML IJ SOLN
INTRAVENOUS | Status: DC | PRN
  Administered 2017-05-24: 50 ug/min via INTRAVENOUS

## 2017-05-24 MED ORDER — ALBUMIN HUMAN 5 % IV SOLN
INTRAVENOUS | Status: DC | PRN
  Administered 2017-05-24 (×2): via INTRAVENOUS

## 2017-05-24 MED ORDER — MORPHINE SULFATE (PF) 2 MG/ML IV SOLN
2.0000 mg | INTRAVENOUS | Status: DC | PRN
  Administered 2017-05-24 – 2017-06-13 (×36): 2 mg via INTRAVENOUS
  Filled 2017-05-24 (×36): qty 1

## 2017-05-24 MED ORDER — HEPARIN (PORCINE) IN NACL 100-0.45 UNIT/ML-% IJ SOLN
1800.0000 [IU]/h | INTRAMUSCULAR | Status: DC
  Administered 2017-05-25 – 2017-05-26 (×2): 1650 [IU]/h via INTRAVENOUS
  Filled 2017-05-24 (×4): qty 250

## 2017-05-24 MED ORDER — ALBUTEROL SULFATE (2.5 MG/3ML) 0.083% IN NEBU: 2.5000 mg | INHALATION_SOLUTION | RESPIRATORY_TRACT | Status: DC | PRN

## 2017-05-24 MED ORDER — PROPOFOL 10 MG/ML IV BOLUS
INTRAVENOUS | Status: DC | PRN
  Administered 2017-05-24: 100 mg via INTRAVENOUS
  Administered 2017-05-24: 50 mg via INTRAVENOUS

## 2017-05-24 MED ORDER — FREE WATER
250.0000 mL | Freq: Four times a day (QID) | Status: DC
  Administered 2017-05-24 – 2017-05-26 (×7): 250 mL

## 2017-05-24 MED ORDER — MIDAZOLAM HCL 2 MG/2ML IJ SOLN: 0.5000 mg | Freq: Once | INTRAMUSCULAR | Status: DC | PRN

## 2017-05-24 MED ORDER — DOCUSATE SODIUM 100 MG PO CAPS
100.0000 mg | ORAL_CAPSULE | Freq: Every day | ORAL | Status: DC
  Filled 2017-05-24: qty 1

## 2017-05-24 MED ORDER — SODIUM CHLORIDE 0.9 % IV SOLN
10.0000 mL/h | Freq: Once | INTRAVENOUS | Status: AC
  Administered 2017-05-24: 11:00:00 via INTRAVENOUS

## 2017-05-24 MED ORDER — FENTANYL CITRATE (PF) 250 MCG/5ML IJ SOLN
INTRAMUSCULAR | Status: AC
  Filled 2017-05-24: qty 5

## 2017-05-24 MED ORDER — IPRATROPIUM-ALBUTEROL 0.5-2.5 (3) MG/3ML IN SOLN
3.0000 mL | Freq: Two times a day (BID) | RESPIRATORY_TRACT | Status: DC
  Administered 2017-05-24 – 2017-05-27 (×6): 3 mL via RESPIRATORY_TRACT
  Filled 2017-05-24 (×6): qty 3

## 2017-05-24 MED ORDER — PROMETHAZINE HCL 25 MG/ML IJ SOLN: 6.2500 mg | INTRAMUSCULAR | Status: DC | PRN

## 2017-05-24 MED ORDER — 0.9 % SODIUM CHLORIDE (POUR BTL) OPTIME
TOPICAL | Status: DC | PRN
  Administered 2017-05-24: 1000 mL

## 2017-05-24 MED ORDER — EPHEDRINE SULFATE 50 MG/ML IJ SOLN
INTRAMUSCULAR | Status: DC | PRN
  Administered 2017-05-24: 15 mg via INTRAVENOUS

## 2017-05-24 MED ORDER — CEFAZOLIN SODIUM-DEXTROSE 2-4 GM/100ML-% IV SOLN
2.0000 g | Freq: Three times a day (TID) | INTRAVENOUS | Status: AC
  Administered 2017-05-24 – 2017-05-25 (×2): 2 g via INTRAVENOUS
  Filled 2017-05-24 (×2): qty 100

## 2017-05-24 MED ORDER — LACTATED RINGERS IV SOLN
INTRAVENOUS | Status: DC
  Administered 2017-05-24 (×2): via INTRAVENOUS

## 2017-05-24 MED ORDER — MORPHINE SULFATE (PF) 4 MG/ML IV SOLN
4.0000 mg | INTRAVENOUS | Status: DC | PRN
  Administered 2017-05-24: 4 mg via INTRAVENOUS
  Filled 2017-05-24: qty 1

## 2017-05-24 MED ORDER — CEFAZOLIN SODIUM-DEXTROSE 2-3 GM-%(50ML) IV SOLR
INTRAVENOUS | Status: DC | PRN
  Administered 2017-05-24: 2 g via INTRAVENOUS

## 2017-05-24 MED ORDER — ONDANSETRON HCL 4 MG/2ML IJ SOLN
INTRAMUSCULAR | Status: AC
  Filled 2017-05-24: qty 2

## 2017-05-24 SURGICAL SUPPLY — 49 items
BANDAGE ACE 4X5 VEL STRL LF (GAUZE/BANDAGES/DRESSINGS) ×6 IMPLANT
BANDAGE ACE 6X5 VEL STRL LF (GAUZE/BANDAGES/DRESSINGS) ×6 IMPLANT
BANDAGE ESMARK 6X9 LF (GAUZE/BANDAGES/DRESSINGS) ×1 IMPLANT
BLADE SAW SAG 29X58X.64 (BLADE) ×3 IMPLANT
BLADE SAW SAG 73X25 THK (BLADE) ×1
BLADE SAW SGTL 73X25 THK (BLADE) ×2 IMPLANT
BNDG COHESIVE 6X5 TAN STRL LF (GAUZE/BANDAGES/DRESSINGS) ×6 IMPLANT
BNDG ESMARK 6X9 LF (GAUZE/BANDAGES/DRESSINGS) ×3
BNDG GAUZE ELAST 4 BULKY (GAUZE/BANDAGES/DRESSINGS) ×6 IMPLANT
CANISTER SUCT 3000ML PPV (MISCELLANEOUS) ×3 IMPLANT
CLIP VESOCCLUDE MED 6/CT (CLIP) ×3 IMPLANT
COVER BACK TABLE 60X90IN (DRAPES) ×3 IMPLANT
COVER SURGICAL LIGHT HANDLE (MISCELLANEOUS) ×6 IMPLANT
DRAIN CHANNEL 19F RND (DRAIN) IMPLANT
DRAPE HALF SHEET 40X57 (DRAPES) ×3 IMPLANT
DRAPE ORTHO SPLIT 77X108 STRL (DRAPES) ×4
DRAPE SURG ORHT 6 SPLT 77X108 (DRAPES) ×2 IMPLANT
DRAPE U-SHAPE 47X51 STRL (DRAPES) ×3 IMPLANT
DRSG ADAPTIC 3X8 NADH LF (GAUZE/BANDAGES/DRESSINGS) ×6 IMPLANT
ELECT CAUTERY BLADE 6.4 (BLADE) ×3 IMPLANT
ELECT REM PT RETURN 9FT ADLT (ELECTROSURGICAL) ×3
ELECTRODE REM PT RTRN 9FT ADLT (ELECTROSURGICAL) ×1 IMPLANT
EVACUATOR SILICONE 100CC (DRAIN) IMPLANT
GAUZE SPONGE 4X4 12PLY STRL (GAUZE/BANDAGES/DRESSINGS) ×6 IMPLANT
GAUZE SPONGE 4X4 12PLY STRL LF (GAUZE/BANDAGES/DRESSINGS) ×6 IMPLANT
GLOVE BIO SURGEON STRL SZ7 (GLOVE) ×6 IMPLANT
GLOVE BIOGEL PI IND STRL 7.5 (GLOVE) ×1 IMPLANT
GLOVE BIOGEL PI INDICATOR 7.5 (GLOVE) ×2
GOWN STRL REUS W/ TWL LRG LVL3 (GOWN DISPOSABLE) ×2 IMPLANT
GOWN STRL REUS W/ TWL XL LVL3 (GOWN DISPOSABLE) ×1 IMPLANT
GOWN STRL REUS W/TWL LRG LVL3 (GOWN DISPOSABLE) ×4
GOWN STRL REUS W/TWL XL LVL3 (GOWN DISPOSABLE) ×2
KIT BASIN OR (CUSTOM PROCEDURE TRAY) ×3 IMPLANT
KIT ROOM TURNOVER OR (KITS) ×3 IMPLANT
NS IRRIG 1000ML POUR BTL (IV SOLUTION) ×3 IMPLANT
PACK GENERAL/GYN (CUSTOM PROCEDURE TRAY) ×3 IMPLANT
PAD ARMBOARD 7.5X6 YLW CONV (MISCELLANEOUS) ×6 IMPLANT
STAPLER VISISTAT 35W (STAPLE) ×9 IMPLANT
STOCKINETTE IMPERVIOUS LG (DRAPES) ×6 IMPLANT
SUT ETHILON 3 0 PS 1 (SUTURE) IMPLANT
SUT SILK 0 TIES 10X30 (SUTURE) ×3 IMPLANT
SUT SILK 2 0 (SUTURE) ×2
SUT SILK 2-0 18XBRD TIE 12 (SUTURE) ×1 IMPLANT
SUT VIC AB 2-0 CT1 18 (SUTURE) ×15 IMPLANT
SUT VIC AB 2-0 UR6 27 (SUTURE) ×3 IMPLANT
SUT VIC AB 3-0 SH 18 (SUTURE) ×6 IMPLANT
TOWEL GREEN STERILE (TOWEL DISPOSABLE) ×6 IMPLANT
UNDERPAD 30X30 (UNDERPADS AND DIAPERS) ×3 IMPLANT
WATER STERILE IRR 1000ML POUR (IV SOLUTION) ×3 IMPLANT

## 2017-05-24 NOTE — Progress Notes (Signed)
PULMONARY MEDICINE follow up   Name: Joseph FloridaLarry E Cannan MRN: [id number] DOB: 1950/12/21    ADMISSION DATE:  05/01/2017 CONSULTATION DATE:  05/01/17 REFERRING MD:  Dr. Deretha EmoryZackowski CHIEF COMPLAINT:  Found down  HISTORY OF PRESENT ILLNESS:   67 year old homeless man found down/13 with hypothermia, shock lactic acidosis. Found to have bilateral DVTs and evidence of RV strain on echo He had Acinetobacter in blood and staph in urine with elevated PSA. Unable to be weaned from vent, trach placed 2/27, plans to place PEG 2/28 STUDIES:  ECHO 2/14 - EF 65-70%, G1DD, RV strain Echo 2/21 - EF 55-60%, RV improved CULTURES: MRSA PCR negative Ucx + staph lugdenensis Blood cx 2/13 >> Diphtheroids + Acinetobacter lwoffii Blood cx 2/19 >>no growth x 5 days Blood cx 2/20 >> no growth x 5 days  ANTIBIOTICS: Zosyn 2/13> 2/18 Vanc 2/13 >>2/19 Meropenem 2/18 >> SIGNIFICANT EVENTS: 2/13 admitted, intubated 2/22 IVC filter placed 2/25 1u PRBC 2/27 Trach LINES/TUBES: ETT 2/13 >> 2/27 Trach 2/27 Foley 2/13 PEG 2/28 > Right IJ HD catheter 2/15 >>out  Left IJ CVC 2/15 >>  SUBJECTIVE:  No pain, bleeding Had bilateral AKA 3/8 am Today's Vitals   05/24/17 1200 05/24/17 1215 05/24/17 1230 05/24/17 1257  BP: (!) 155/89 (!) 153/99 (!) 169/95   Pulse: 90 (!) 103 (!) 101   Resp: (!) 21 (!) 30 (!) 21   Temp:   97.9 F (36.6 C) 98.9 F (37.2 C)  TempSrc:    Oral  SpO2: 98% 100% 99%   Weight:      Height:      PainSc:          Intake/Output Summary (Last 24 hours) at 05/24/2017 1413 Last data filed at 05/24/2017 1227 Gross per 24 hour  Intake 3375.67 ml  Output 5100 ml  Net -1724.33 ml    PHYSICAL EXAMINATION: General: chronically ill-appearing 67 year old African-American male  aerosol trach collar  no acute distress HEENT: He has a #6 cuffed tracheostomy, normocephalic Pulmonary: diminished breath sounds; no rhonchi Cardiac: Regular rate and rhythm Abdomen: Soft , non-tender,  tolerating  tube feeds via PEG tube. Neuro/psych: Awake,   tries to communicate,   has generalized lower extremity weakness.  Extremities/musculoskeletal: bilateral AKA- bandaged  LABS:  BMP Latest Ref Rng & Units 05/24/2017 05/23/2017 05/22/2017  Glucose 65 - 99 mg/dL 99 409(W117(H) 119(J135(H)  BUN 6 - 20 mg/dL 47(W37(H) 29(F45(H) 62(Z51(H)  Creatinine 0.61 - 1.24 mg/dL 3.08(M1.70(H) 5.78(I1.89(H) 6.96(E2.08(H)  Sodium 135 - 145 mmol/L 147(H) 145 147(H)  Potassium 3.5 - 5.1 mmol/L 4.4 4.3 4.1  Chloride 101 - 111 mmol/L 113(H) 116(H) 117(H)  CO2 22 - 32 mmol/L 23 21(L) 20(L)  Calcium 8.9 - 10.3 mg/dL 8.0(L) 7.9(L) 8.0(L)      05/17/17 cxr elevated diaphragm(R), bibasilar atelectasis, LIJ cvc tip in SVC, trach position is good   Ct Head Wo Contrast  Result Date: 05/23/2017 CLINICAL DATA:  67 year old currently anticoagulated with heparin who fell out of his bed and sustained minor head trauma. EXAM: CT HEAD WITHOUT CONTRAST TECHNIQUE: Contiguous axial images were obtained from the base of the skull through the vertex without intravenous contrast. COMPARISON:  07/08/2015. FINDINGS: Brain: Ventricular system normal in size and appearance for age. Mild age related cortical atrophy. No mass lesion. No midline shift. No acute hemorrhage or hematoma. No extra-axial fluid collections. No evidence of acute infarction. Physiologic calcifications in the basal ganglia. Vascular: No visible atherosclerosis involving the carotid siphons or the vertebrobasilar system. No hyperdense vessel.  Skull: No skull fracture or other focal osseous abnormality involving the skull. Sinuses/Orbits: Visualized paranasal sinuses, bilateral mastoid air cells and bilateral middle ear cavities well-aerated. 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 Visualized orbits and globes normal in appearance. Other: None. IMPRESSION: No acute intracranial abnormality. Electronically Signed   By: Hulan Saas M.D.   On: 05/23/2017 17:27      ASSESSMENT / PLAN:   Acute  respiratory failure on ventilator- trach 2/27 Likely pulmonary embolism with associated right heart strain Toxic metabolic encephalopathy Agitation Stress related non-ST elevation MI, suspect right heart strain Acute Cor pulmonale Acute non-oliguric renal failure; creatinine is improved some Rhabdomyolysis Fluid and electrolyte imbalance: Hypernatremia, hypokalemia, metabolic acidosis  Acinetobacter bacteremia. 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 Continue 02, Peg feeds, supportive care.   Pulm CCM available to f/u over w/e if needed; Pls call  if needed; Otherwise PCCM f/u after 3/11     ^^^^^^^^^^ I  Have personally spent   20   Minutes  In the care of this Patient providing pulmonary care Services; Time includes review of chart, labs, imaging, coordinating care with other physicians and healthcare team members.  Excludes time spent for Procedure .   ^^^^^^^^^^  Note subject to typographical and grammatical errors;   Any formal questions or concerns about the content, text, or information contained within the body of this dictation should be directly addressed to the physician  for  clarification.   Caryl Comes, MD Pulmonary and Critical Care Medicine [facility] Pager: [phone]

## 2017-05-24 NOTE — Interval H&P Note (Signed)
   History and Physical Update  The patient was interviewed and re-examined.  The patient's previous History and Physical has been reviewed and is unchanged from Dr. Adele Danickson's consult.  There is no change in the plan of care: B AKA.   Dr. Edilia Boickson had discussed in depth the nature of bilateral above-the-knee amputations with the patient's family, including risks, benefits, and alternatives.    The patient's family is aware that the risks of bilateral above-the-knee amputations include but are not limited to: bleeding, infection, myocardial infarction, stroke, death, failure to heal amputation wound, and possible need for more proximal amputation.    The patient's family is aware of the risks and agrees proceed forward with the procedure.   Leonides SakeBrian Andria Head, MD, FACS [facility] Office: [phone] Pager: [phone]  05/24/2017, 8:41 AM

## 2017-05-24 NOTE — Op Note (Signed)
OPERATIVE NOTE   PROCEDURE: bilateral above-the-knee amputation  PRE-OPERATIVE DIAGNOSIS: bilateral foot gangrene  POST-OPERATIVE DIAGNOSIS: same as above  SURGEON: Leonides Sake, MD  ASSISTANT(S):  Emilie Rutter, PAC   ANESTHESIA: general  ESTIMATED BLOOD LOSS: 400 cc  FINDING(S): 1.  Viable muscle in bilateral thighs  SPECIMEN(S):  bilateral above-the-knee amputation  INDICATIONS:   Joseph Downs is a 67 y.o. male who presents with bilateral foot gangrene.  The patient is scheduled for a bilateral above-the-knee amputation.  I discussed in depth with the patient the risks, benefits, and alternatives to this procedure.  The patient is aware that the risk of this operation included but are not limited to:  bleeding, infection, myocardial infarction, stroke, death, failure to heal amputation wound, and possible need for more proximal amputation.  The patient is aware of the risks and agrees proceed forward with the procedure.  DESCRIPTION: After full informed written consent was obtained from the patient, the patient was brought back to the operating room, and placed supine upon the operating table.  Prior to induction, the patient received IV antibiotics.  The patient was then prepped and draped in the standard fashion for an above-the-knee amputation.  After obtaining adequate anesthesia, the patient was prepped and draped in the standard fashion for a above-the-knee amputation.  I marked out the anterior and posterior flaps for a fish-mouth type of amputation on both thighs.  I put a sterile tourniquet on the right thigh.  I exsanguinated the leg with an Esmarch bandage and then inflated the tourniquet to 250 mm Hg.   I made the incisions for these flaps, and then dissected through the subcutaneous tissue, fascia, and muscles circumferentially.  I elevated the periosteal tissue 4 cm more proximal than the anterior skin flap.  I then transected the femur with a power saw at this  level.  Then I smoothed out the rough edges of the bone with a rasp.  At this point, the specimen was passed off the field as the above-the-knee amputation.  At this point, I clamped all visibly bleeding arteries and veins using a combination of suture ligation with Vicryl suture and electrocautery.  Active bleeding was found from the superficial femoral artery almost 4 cm proximal to the level of the amputation.  I was not clear how this injury occurred.  This was controlled with suture ligation.  The tourniquet was then deflated at this point.  Bleeding continued to be controlled with electrocautery and suture ligature.  The stump was washed off with sterile normal saline and no further active bleeding was noted.  I reapproximated the anterior and posterior fascia  with interrupted stitches of 2-0 Vicryl.  This was completed along the entire length of anterior and posterior fascia until there were no more loose space in the fascial line.  The skin was then  reapproximated with staples.  The stump was washed off and dried.  The incision was dressed with [employer] and  then fluffs were applied.  Kerlix was wrapped around the leg and then gently an ACE wrap was applied.    At this point, the tourniquet was removed and replaced on the left thigh.  I repeated the same process as detailed above for the left leg.  There was no superficial femoral artery injury noted after amputation of this limb.  The left above-knee amputation stump was redressed as listed.   COMPLICATIONS: none  CONDITION: stable   Leonides Sake, MD, [facility]  [place] Office: [phone] Pager: [phone]  05/24/2017, 11:33 AM

## 2017-05-24 NOTE — Progress Notes (Signed)
PROGRESS NOTE    Joseph Downs  OJJ:[id number] DOB: 1950-06-13 DOA: 05/01/2017 PCP: Patient, No Pcp Per   Brief Narrative:  Joseph Downs is a 67 yo M, reportedly homeless, no past medical history that I can find from his recent admission to our service other than obstructive uropathy requiring foley.  Evidently, on 2/13, he was found down in the cold.  Brought to the ER, hypotensive, had Lactate >7 despite 30 cc/kg IVF and re-warming, was admitted to ICU, at that time was mentating enough to explain that he had passed out, not clear why.  That night, developed respiratory distress requiring intubation and hypotension. Following that, was treated with broad spectrum antibiotics for hypotension, tachycardia and leukocytosis. Renal failure, creatinine up to 7. Started on CVVHD. Blood cultures grew Acineto lwoffii, Urine cx frew staph lugdunensis.  Echo showed severe RV strain and bilateral LE dopplers showed extensive DVT.  CTA not possible, nor catheterization/lysis due to AKI.  Started on heparin.  At time of transfer out of ICU to [facility] service, patient has tolerated 2 days intermittent trach collar, back on vent at night.  Has PEG.  Cardiology, Nephrology, Vascular surgery, and Pulm are following. S/p B/L AKA  Assessment & Plan   Acute respiratory failure -PCCM follow as patient has trach collar  Presumed massive pulmonary embolism/Bilateral DVT -Status post IVC filter-placed due to doubts of patient's future compliance with oral anticoagulation -Continue heparin  Acute kidney injury -History of recent obstructive uropathy. ?  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)- needs an outpatient work up -She did have a Foley catheter previous hospitalization -Creatinine currently appears to be improving -Nephrology consulted and appreciated and is now signed off  Bacteremia -Cultures from 05/01/2017 showed diphtheroids, Acinetobacter lwoffii -Repeat  blood cultures show no growth -Patient was placed on vancomycin and Zosyn however then transitioned to meropenem  Hypernatremia -Continue free water, increased frequency today due to sodium of 147 today -will continue to monitor BMP  Acute metabolic encephalopathy -Appears to be improving slowly -Avoid benzodiazepines -continue Haldol  NSTEMI -Cardiology consulted and appreciated, continue aspirin -Possibly related to PE  Normocytic Anemia -Unclear etiology -Was transfused 1 unit PRBC earlier this hospitalization -Hemoglobin currently 8.2, remaining stable -Continue to monitor CBC  Presumed frostbite/Gangrene of feet -Vascular surgery consulted and appreciated -Wound care consulted -Patient may need bilateral lower extremity amputations -s/p bilateral AKA today -will need PT and OT consults- will place on 05/25/2017  DVT Prophylaxis heparin  Code Status: Full  Family Communication: None at bedside.  Discussed with sister on 05/22/2017 via phone.  Disposition Plan: admitted. 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  Antibiotics   Anti-infectives (From admission, onward)   Start     Dose/Rate Route Frequency Ordered Stop   05/24/17 1800  ceFAZolin (ANCEF) IVPB 2g/100 mL premix     2 g 200 mL/hr over 30 Minutes Intravenous Every 8 hours 05/24/17 1300 05/25/17 0959   05/07/17 2200  meropenem (MERREM) 500 mg in sodium chloride 0.9 % 100 mL IVPB     500 mg 200 mL/hr over 30 Minutes Intravenous Every 24 hours 05/07/17 1306 05/19/17 1911   05/07/17 0930  meropenem (MERREM) 1 g in sodium chloride 0.9 % 100 mL IVPB  Status:  Discontinued     1 g  200 mL/hr over 30 Minutes Intravenous Every 12 hours 05/07/17 0908 05/07/17 1306    05/06/17 1300  meropenem (MERREM) 500 mg in sodium chloride 0.9 % 100 mL IVPB  Status:  Discontinued     500 mg 200 mL/hr over 30 Minutes Intravenous Every 24 hours 05/06/17 1146 05/07/17 0908   05/04/17 2200  vancomycin (VANCOCIN) IVPB 1000 mg/200 mL premix     1,000 mg 200 mL/hr over 60 Minutes Intravenous Every 24 hours 05/04/17 0933 05/07/17 2339   05/04/17 1400  piperacillin-tazobactam (ZOSYN) IVPB 3.375 g  Status:  Discontinued     3.375 g 100 mL/hr over 30 Minutes Intravenous Every 6 hours 05/04/17 0932 05/06/17 1146   05/03/17 1300  vancomycin (VANCOCIN) IVPB 1000 mg/200 mL premix  Status:  Discontinued     1,000 mg 200 mL/hr over 60 Minutes Intravenous Every 48 hours 05/02/17 1340 05/04/17 0933   05/02/17 2000  piperacillin-tazobactam (ZOSYN) IVPB 2.25 g  Status:  Discontinued     2.25 g 100 mL/hr over 30 Minutes Intravenous Every 6 hours 05/02/17 1342 05/04/17 0932   05/02/17 1300  vancomycin (VANCOCIN) IVPB 1000 mg/200 mL premix  Status:  Discontinued     1,000 mg 200 mL/hr over 60 Minutes Intravenous Every 24 hours 05/01/17 1313 05/02/17 1340   05/01/17 2000  piperacillin-tazobactam (ZOSYN) IVPB 3.375 g  Status:  Discontinued     3.375 g 12.5 mL/hr over 240 Minutes Intravenous Every 8 hours 05/01/17 1313 05/02/17 1342   05/01/17 1200  vancomycin (VANCOCIN) 1,750 mg in sodium chloride 0.9 % 500 mL IVPB     1,750 mg 250 mL/hr over 120 Minutes Intravenous  Once 05/01/17 1121 05/01/17 1409   05/01/17 1130  piperacillin-tazobactam (ZOSYN) IVPB 3.375 g     3.375 g 100 mL/hr over 30 Minutes Intravenous  Once 05/01/17 1119 05/01/17 1217   05/01/17 1130  vancomycin (VANCOCIN) IVPB 1000 mg/200 mL premix  Status:  Discontinued     1,000 mg 200 mL/hr over 60 Minutes Intravenous  Once 05/01/17 1119 05/01/17 1121      Subjective:   Joseph Downs seen and examined today.  Seen after surgery, currently complains of pain. Denies chest pain or shortness of breath.   Objective:    Vitals:   05/24/17 1200 05/24/17 1215 05/24/17 1230 05/24/17 1257  BP: (!) 155/89 (!) 153/99 (!) 169/95   Pulse: 90 (!) 103 (!) 101   Resp: (!) 21 (!) 30 (!) 21   Temp:   97.9 F (36.6 C) 98.9 F (37.2 C)  TempSrc:    Oral  SpO2: 98% 100% 99%   Weight:      Height:        Intake/Output Summary (Last 24 hours) at 05/24/2017 1449 Last data filed at 05/24/2017 1227 Gross per 24 hour  Intake 3375.67 ml  Output 5100 ml  Net -1724.33 ml   Filed Weights   05/21/17 0346 05/22/17 0500 05/24/17 0420  Weight: 88.9 kg (195 lb 15.8 oz) 85.6 kg (188 lb 11.4 oz) 86.2 kg (190 lb)   Exam  General: Well developed, well nourished, mild distress  HEENT: NCAT, mucous membranes moist.   Neck: Trach  Cardiovascular: S1 S2 auscultated, no rubs, murmurs or gallops. Regular rate and rhythm.  Respiratory: diminished, but clear  Abdomen: Soft, nontender, nondistended, + bowel sounds, +peg  Extremities: B/L AKA, dressing in place  Neuro: awake and alert, follows commands   Data Reviewed: I have personally reviewed following labs and imaging studies  CBC:  Recent Labs  Lab 05/17/17 2300  05/20/17 0500 05/21/17 0906 05/22/17 0305 05/23/17 0315 05/24/17 0312  WBC 14.1*   < > 9.6 13.3* 13.8* 13.2* 12.9*  NEUTROABS 11.0*  --   --   --   --   --   --   HGB 6.9*   < > 7.8* 8.4* 8.4* 9.1* 8.2*  HCT 22.8*   < > 25.4* 27.0* 27.3* 29.0* 26.3*  MCV 94.2   < > 96.2 95.7 94.1 94.2 94.6  PLT 346   < > 334 355 376 330 350   < > = values in this interval not displayed.   Basic Metabolic Panel: Recent Labs  Lab 05/20/17 0500 05/21/17 0906 05/22/17 0305 05/23/17 0315 05/24/17 0312  NA 154* 150* 147* 145 147*  K 4.0 4.2 4.1 4.3 4.4  CL 125* 123* 117* 116* 113*  CO2 20* 19* 20* 21* 23  GLUCOSE 134* 132* 135* 117* 99  BUN 70* 57* 51* 45* 37*  CREATININE 2.76* 2.22* 2.08* 1.89* 1.70*  CALCIUM 7.7* 8.0* 8.0* 7.9* 8.0*  PHOS 4.1 3.1 2.6 3.3 3.8   GFR: Estimated Creatinine Clearance: 44.1  mL/min (A) (by C-G formula based on SCr of 1.7 mg/dL (H)). Liver Function Tests: Recent Labs  Lab 05/20/17 0500 05/21/17 0906 05/22/17 0305 05/23/17 0315 05/24/17 0312  ALBUMIN 1.4* 1.6* 1.6* 1.7* 1.8*   No results for input(s): LIPASE, AMYLASE in the last 168 hours. No results for input(s): AMMONIA in the last 168 hours. Coagulation Profile: No results for input(s): INR, PROTIME in the last 168 hours. Cardiac Enzymes: No results for input(s): CKTOTAL, CKMB, CKMBINDEX, TROPONINI in the last 168 hours. BNP (last 3 results) No results for input(s): PROBNP in the last 8760 hours. HbA1C: No results for input(s): HGBA1C in the last 72 hours. CBG: Recent Labs  Lab 05/23/17 2003 05/23/17 2328 05/24/17 0421 05/24/17 0751 05/24/17 1307  GLUCAP 118* 113* 104* 92 103*   Lipid Profile: No results for input(s): CHOL, HDL, LDLCALC, TRIG, CHOLHDL, LDLDIRECT in the last 72 hours. Thyroid Function Tests: No results for input(s): TSH, T4TOTAL, FREET4, T3FREE, THYROIDAB in the last 72 hours. Anemia Panel: No results for input(s): VITAMINB12, FOLATE, FERRITIN, TIBC, IRON, RETICCTPCT in the last 72 hours. Urine analysis:    Component Value Date/Time   COLORURINE AMBER (A) 05/01/2017 1313   APPEARANCEUR TURBID (A) 05/01/2017 1313   LABSPEC 1.014 05/01/2017 1313   PHURINE 5.0 05/01/2017 1313   GLUCOSEU 50 (A) 05/01/2017 1313   HGBUR MODERATE (A) 05/01/2017 1313   BILIRUBINUR NEGATIVE 05/01/2017 1313   KETONESUR NEGATIVE 05/01/2017 1313   PROTEINUR 100 (A) 05/01/2017 1313   NITRITE NEGATIVE 05/01/2017 1313   LEUKOCYTESUR MODERATE (A) 05/01/2017 1313   Sepsis Labs: _0 (procalcitonin:4,lacticidven:4)  )No results found for this or any previous visit (from the past 240 hour(s)).    Radiology Studies: Ct Head Wo Contrast  Result Date: 05/23/2017 CLINICAL DATA:  67 year old currently anticoagulated with heparin who fell out of his bed and sustained minor head trauma. EXAM: CT  HEAD WITHOUT CONTRAST TECHNIQUE: Contiguous axial images were obtained from the base of the skull through the vertex without intravenous contrast. COMPARISON:  07/08/2015. FINDINGS: Brain: Ventricular system normal in size and appearance for age. Mild age related cortical atrophy. No mass lesion. No midline shift. No acute hemorrhage or hematoma. No extra-axial fluid collections. No evidence of acute infarction. Physiologic calcifications in the basal ganglia. Vascular: No visible atherosclerosis involving the carotid siphons or the vertebrobasilar system. No  hyperdense vessel. Skull: No skull fracture or other focal osseous abnormality involving the skull. Sinuses/Orbits: Visualized paranasal sinuses, bilateral mastoid air cells and bilateral middle ear cavities well-aerated. 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 Visualized orbits and globes normal in appearance. Other: None. IMPRESSION: No acute intracranial abnormality. Electronically Signed   By: Evangeline Dakin M.D.   On: 05/23/2017 17:27     Scheduled Meds: . aspirin  81 mg Per Tube Daily  . chlorhexidine gluconate (MEDLINE KIT)  15 mL Mouth Rinse BID  . [START ON 05/25/2017] docusate sodium  100 mg Oral Daily  . free water  250 mL Per Tube Q6H  . haloperidol  1 mg Oral Daily  . haloperidol  2 mg Oral QHS  . insulin aspart  2-6 Units Subcutaneous Q4H  . ipratropium-albuterol  3 mL Nebulization BID  . mouth rinse  15 mL Mouth Rinse QID  . Melatonin  6 mg Oral QHS  . pantoprazole sodium  40 mg Per Tube QHS  . silver sulfADIAZINE   Topical Daily  . sodium chloride flush  10-40 mL Intracatheter Q12H  . thiamine  100 mg Per Tube Daily   Continuous Infusions: . sodium chloride 50 mL/hr at 05/24/17 1351  .  ceFAZolin (ANCEF) IV    . feeding supplement (VITAL AF 1.2 CAL) 1,000 mL (05/24/17 1351)  . heparin    . lactated ringers 10 mL/hr at 05/24/17 0818  . magnesium sulfate 1 - 4 g bolus IVPB       LOS: 23 days    Time Spent in minutes   30 minutes  Lian Pounds D.O. on 05/24/2017 at 2:49 PM  Between 7am to 7pm - Pager - [phone]  After 7pm go to [email or web address] - password [email or web address]  And look for the night coverage person covering for me after hours  [facility] Office  [phone]

## 2017-05-24 NOTE — Progress Notes (Signed)
CSW received call from pt's sister Eber JonesCarolyn informing CSW that she has spoken with doctors at the hospital and they have decided to amputate both of pt's legs today at 12pm. CSW was informed by Eber Jonesarolyn that she has been working on getting Medicaid as she realizes that pt will need it in the end. CSW will continue to follow for any further needs at this time.    Claude MangesKierra S. Shakeia Krus, MSW, LCSW-A Emergency Department Clinical Social Worker [phone]

## 2017-05-24 NOTE — Care Management Note (Signed)
Case Management Note  Patient Details  Name: Melonie FloridaLarry E Madrid MRN: [id number] Date of Birth: [birth year]  Subjective/Objective:    Pt admitted with Hypothermia shock, and bilateral DVTs                Action/Plan:  PTA from home and was found down.  Pt remains on ventilator via ETT tube.  Pt originally on CRRT - now discontinued and daily IHD assessments.  CM will continue to follow for discharge needs   Expected Discharge Date:  05/27/17               Expected Discharge Plan:     In-House Referral:     Discharge planning Services  CM Consult  Post Acute Care Choice:    Choice offered to:     DME Arranged:    DME Agency:     HH Arranged:    HH Agency:     Status of Service:     If discussed at [employer] Meetings, dates discussed:    Additional Comments: 05/24/2017 Pt is now s/p bilateral BKA.  CSW continuing to follow for discharge to SNF when appropriate [name], Leliana Kontz S, RN 05/24/2017, 3:34 PM

## 2017-05-24 NOTE — Progress Notes (Signed)
OT Cancellation Note  Patient Details Name: Joseph FloridaLarry E Manlove MRN: [id number] DOB: Oct 18, 1950   Cancelled Treatment:    Reason Eval/Treat Not Completed: Patient at procedure or test/ unavailable(sx for Bilateral AKA). OT will continue to follow post-op.  Evern BioLaura J Shahid Flori 05/24/2017, 8:17 AM  Sherryl MangesLaura Cyril Woodmansee OTR/L [phone]

## 2017-05-24 NOTE — Progress Notes (Signed)
Physical medicine rehabilitation consult requested chart reviewed. Patient status post bilateral AKA 05/24/2017. Will await formal physical and occupational therapy evaluations and follow-up with appropriate recommendations

## 2017-05-24 NOTE — Anesthesia Preprocedure Evaluation (Addendum)
Anesthesia Evaluation  Patient identified by MRN, date of birth, ID band Patient awake    Reviewed: Allergy & Precautions, NPO status , Patient's Chart, lab work & pertinent test results  History of Anesthesia Complications Negative for: history of anesthetic complications  Airway Mallampati: Trach  TM Distance: >3 FB     Dental  (+) Edentulous Upper, Edentulous Lower   Pulmonary  Trach: unable to wean vent with sepsis, on trach collar now   breath sounds clear to auscultation       Cardiovascular + Past MI (NSTEMI with acute sepsis) and + Peripheral Vascular Disease (bilateral gangrene of LE with frostbite)   Rhythm:Regular Rate:Normal  2/19 ECHO: EF 55-60%, valves OK   Neuro/Psych negative neurological ROS     GI/Hepatic Neg liver ROS, PEG with tube feeding   Endo/Other  negative endocrine ROS  Renal/GU Renal Insufficiency and ARFRenal disease (creat 1.70, s/p acute renal failure requiring CVVH)   BPH    Musculoskeletal Frostbite: Bilateral lower extrem ischemia   Abdominal   Peds  Hematology  (+) Blood dyscrasia (Hb 8.2), anemia ,   Anesthesia Other Findings   Reproductive/Obstetrics                            Anesthesia Physical Anesthesia Plan  ASA: IV  Anesthesia Plan: General   Post-op Pain Management:    Induction: Intravenous and Inhalational  PONV Risk Score and Plan: 3 and Ondansetron and Dexamethasone  Airway Management Planned: Tracheostomy  Additional Equipment:   Intra-op Plan:   Post-operative Plan: Possible Post-op intubation/ventilation  Informed Consent: I have reviewed the patients History and Physical, chart, labs and discussed the procedure including the risks, benefits and alternatives for the proposed anesthesia with the patient or authorized representative who has indicated his/her understanding and acceptance.   Dental advisory given  Plan  Discussed with: CRNA and Surgeon  Anesthesia Plan Comments: (Plan routine monitors, GA via trach)       Anesthesia Quick Evaluation

## 2017-05-24 NOTE — Anesthesia Postprocedure Evaluation (Signed)
Anesthesia Post Note  Patient: Joseph FloridaLarry E Downs  Procedure(s) Performed: Bilateral AMPUTATION ABOVE KNEE (Bilateral Leg Upper)     Patient location during evaluation: PACU Anesthesia Type: General Level of consciousness: awake and alert and patient cooperative Pain management: pain level controlled Vital Signs Assessment: post-procedure vital signs reviewed and stable Respiratory status: spontaneous breathing, nonlabored ventilation, respiratory function stable and patient connected to tracheostomy mask oxygen Cardiovascular status: blood pressure returned to baseline and stable Postop Assessment: no apparent nausea or vomiting Anesthetic complications: no    Last Vitals:  Vitals:   05/24/17 1230 05/24/17 1257  BP: (!) 169/95   Pulse: (!) 101   Resp: (!) 21   Temp: 36.6 C 37.2 C  SpO2: 99%     Last Pain:  Vitals:   05/24/17 1257  TempSrc: Oral  PainSc:                  Deena Shaub,E. Vanassa Penniman

## 2017-05-24 NOTE — Progress Notes (Signed)
TRH paged about CT of head results. No abnormalities detected on CT, provider has instructed me to restart heparin gtt. Pt still attempting to get out of bed and remove equipement. TRH made aware, prn dose of IV haldol ordered and administered to pt. Pt transferred to low bed and fall matts have been placed along with bed alarm. Pt sister has been updated about pt current state and emotional support has been provided about fears of the upcoming procedure. Will continue to monitor pt.

## 2017-05-24 NOTE — Progress Notes (Signed)
ANTICOAGULATION CONSULT NOTE  Pharmacy Consult for Heparin  Indication: chest pain/ACS/DVT/?PE  No Known Allergies  Patient Measurements: Height: 5\' 10"  (177.8 cm) Weight: 190 lb (86.2 kg) IBW/kg (Calculated) : 73  Heparin dosing weight: 83.7 kg  Vital Signs: Temp: 99.3 F (37.4 C) (03/08 0420) Temp Source: Oral (03/08 0420) BP: 164/76 (03/08 0420) Pulse Rate: 98 (03/08 0420)  Labs: Recent Labs    05/21/17 0906 05/22/17 0305 05/23/17 0315 05/24/17 0312  HGB 8.4* 8.4* 9.1* 8.2*  HCT 27.0* 27.3* 29.0* 26.3*  PLT 355 376 330 350  HEPARINUNFRC 0.54 0.61 0.45 <0.10*  CREATININE 2.22* 2.08* 1.89*  --    Assessment: [name] in the ICU after being "found down".Patient has been found to have bilateral lower extremity DVTs with concern for possible PE. Heparin drip held 3/1 evening due to bleeding around PEG site - resumed on 3/2 as this was resolved.   Heparin level is undetectable this morning. I spoke with the RN, early last night the patient fell, heaprin was stopped for a couple hours, but was resumed around 2000. This may be the reason for the subtherapeutic level. Given the fact that the patient is going for surgery later today I will not adjust the heparin at this time. Patient has also been therapeutic at this rate for almost a week.  Goal of Therapy:  Heparin level 0.3-0.7 units/ml Monitor platelets by anticoagulation protocol: Yes    Plan:  Continue heparin at 1600 units/hr Daily HL, CBC F/u plan for anticoagulation after surgery  Baldemar FridayMasters, Shylyn Younce M 05/24/2017 5:27 AM

## 2017-05-24 NOTE — Progress Notes (Signed)
ANTICOAGULATION CONSULT NOTE  Pharmacy Consult for Heparin  Indication: chest pain/ACS/DVT/?PE  No Known Allergies  Patient Measurements: Height: 5\' 10"  (177.8 cm) Weight: 190 lb (86.2 kg) IBW/kg (Calculated) : 73  Heparin dosing weight: 83.7 kg  Vital Signs: Temp: 98.9 F (37.2 C) (03/08 1257) Temp Source: Oral (03/08 1257) BP: 169/95 (03/08 1230) Pulse Rate: 101 (03/08 1230)  Labs: Recent Labs    05/22/17 0305 05/23/17 0315 05/24/17 0312  HGB 8.4* 9.1* 8.2*  HCT 27.3* 29.0* 26.3*  PLT 376 330 350  HEPARINUNFRC 0.61 0.45 <0.10*  CREATININE 2.08* 1.89* 1.70*   Assessment: [name] in the ICU after being "found down".Patient has been found to have bilateral lower extremity DVTs with concern for possible PE. Heparin drip held 3/1 evening due to bleeding around PEG site - resumed on 3/2 as this was resolved.   S/p bilateral AKAs   Goal of Therapy:  Heparin level 0.3-0.7 units/ml Monitor platelets by anticoagulation protocol: Yes    Plan:  Heparin to resume at 1800 pm at 1650  Units / hr Daily HL, CBC F/u plan for anticoagulation after surgery  Thank you Okey RegalLisa Kateryna Grantham, PharmD [phone] 05/24/2017 1:08 PM

## 2017-05-24 NOTE — Transfer of Care (Signed)
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

## 2017-05-24 NOTE — Progress Notes (Signed)
PT Cancellation Note  Patient Details Name: Joseph Downs MRN: [id number] DOB: 1951-03-06   Cancelled Treatment:    Reason Eval/Treat Not Completed: Patient at procedure or test/unavailable (surgery for bilateral AKA). Will follow-up for PT treatment per plan of care.  Ina HomesJaclyn Izzabell Klasen, PT, DPT Acute Rehab Services  Pager: [phone]  Joseph Downs 05/24/2017, 8:56 AM

## 2017-05-25 ENCOUNTER — Inpatient Hospital Stay (HOSPITAL_COMMUNITY): Payer: Medicare Other

## 2017-05-25 ENCOUNTER — Encounter (HOSPITAL_COMMUNITY): Payer: Self-pay | Admitting: Vascular Surgery

## 2017-05-25 DIAGNOSIS — I8290 Acute embolism and thrombosis of unspecified vein: Secondary | ICD-10-CM

## 2017-05-25 DIAGNOSIS — R6510 Systemic inflammatory response syndrome (SIRS) of non-infectious origin without acute organ dysfunction: Secondary | ICD-10-CM

## 2017-05-25 DIAGNOSIS — D62 Acute posthemorrhagic anemia: Secondary | ICD-10-CM

## 2017-05-25 DIAGNOSIS — G9340 Encephalopathy, unspecified: Secondary | ICD-10-CM

## 2017-05-25 DIAGNOSIS — Z89612 Acquired absence of left leg above knee: Secondary | ICD-10-CM

## 2017-05-25 DIAGNOSIS — X398XXD Other exposure to forces of nature, subsequent encounter: Secondary | ICD-10-CM

## 2017-05-25 DIAGNOSIS — E870 Hyperosmolality and hypernatremia: Secondary | ICD-10-CM

## 2017-05-25 DIAGNOSIS — I2699 Other pulmonary embolism without acute cor pulmonale: Secondary | ICD-10-CM

## 2017-05-25 DIAGNOSIS — G8918 Other acute postprocedural pain: Secondary | ICD-10-CM

## 2017-05-25 DIAGNOSIS — Z89611 Acquired absence of right leg above knee: Secondary | ICD-10-CM

## 2017-05-25 DIAGNOSIS — E440 Moderate protein-calorie malnutrition: Secondary | ICD-10-CM

## 2017-05-25 LAB — URINALYSIS, ROUTINE W REFLEX MICROSCOPIC
Bilirubin Urine: NEGATIVE
Glucose, UA: 50 mg/dL — AB
KETONES UR: NEGATIVE mg/dL
Nitrite: POSITIVE — AB
PH: 6 (ref 5.0–8.0)
PROTEIN: NEGATIVE mg/dL
Specific Gravity, Urine: 1.013 (ref 1.005–1.030)

## 2017-05-25 LAB — GLUCOSE, CAPILLARY
GLUCOSE-CAPILLARY: 128 mg/dL — AB (ref 65–99)
GLUCOSE-CAPILLARY: 131 mg/dL — AB (ref 65–99)
GLUCOSE-CAPILLARY: 137 mg/dL — AB (ref 65–99)
GLUCOSE-CAPILLARY: 152 mg/dL — AB (ref 65–99)
Glucose-Capillary: 148 mg/dL — ABNORMAL HIGH (ref 65–99)
Glucose-Capillary: 151 mg/dL — ABNORMAL HIGH (ref 65–99)

## 2017-05-25 LAB — CBC
HCT: 29.3 % — ABNORMAL LOW (ref 39.0–52.0)
Hemoglobin: 9.4 g/dL — ABNORMAL LOW (ref 13.0–17.0)
MCH: 29.4 pg (ref 26.0–34.0)
MCHC: 32.1 g/dL (ref 30.0–36.0)
MCV: 91.6 fL (ref 78.0–100.0)
PLATELETS: 331 10*3/uL (ref 150–400)
RBC: 3.2 MIL/uL — AB (ref 4.22–5.81)
RDW: 15.3 % (ref 11.5–15.5)
WBC: 16.5 10*3/uL — ABNORMAL HIGH (ref 4.0–10.5)

## 2017-05-25 LAB — RENAL FUNCTION PANEL
ALBUMIN: 2 g/dL — AB (ref 3.5–5.0)
Anion gap: 11 (ref 5–15)
BUN: 28 mg/dL — ABNORMAL HIGH (ref 6–20)
CALCIUM: 7.2 mg/dL — AB (ref 8.9–10.3)
CO2: 23 mmol/L (ref 22–32)
CREATININE: 1.54 mg/dL — AB (ref 0.61–1.24)
Chloride: 113 mmol/L — ABNORMAL HIGH (ref 101–111)
GFR, EST AFRICAN AMERICAN: 53 mL/min — AB (ref >60)
GFR, EST NON AFRICAN AMERICAN: 45 mL/min — AB (ref >60)
Glucose, Bld: 139 mg/dL — ABNORMAL HIGH (ref 65–99)
PHOSPHORUS: 3.2 mg/dL (ref 2.5–4.6)
Potassium: 4 mmol/L (ref 3.5–5.1)
Sodium: 147 mmol/L — ABNORMAL HIGH (ref 135–145)

## 2017-05-25 LAB — HEPARIN LEVEL (UNFRACTIONATED)
HEPARIN UNFRACTIONATED: 0.53 [IU]/mL (ref 0.30–0.70)
Heparin Unfractionated: 0.7 IU/mL (ref 0.30–0.70)

## 2017-05-25 MED ORDER — DEXTROSE 5 % IV SOLN
INTRAVENOUS | Status: DC
  Administered 2017-05-25: 75 mL via INTRAVENOUS
  Administered 2017-05-28: 11:00:00 via INTRAVENOUS

## 2017-05-25 MED ORDER — SODIUM CHLORIDE 0.9 % IV SOLN
1.0000 g | INTRAVENOUS | Status: DC
  Administered 2017-05-25 – 2017-05-26 (×2): 1 g via INTRAVENOUS
  Filled 2017-05-25 (×3): qty 10

## 2017-05-25 NOTE — Progress Notes (Signed)
ANTICOAGULATION CONSULT NOTE  Pharmacy Consult for Heparin  Indication: chest pain/ACS/DVT/?PE  No Known Allergies  Patient Measurements: Height: 5\' 10"  (177.8 cm) Weight: 190 lb (86.2 kg) IBW/kg (Calculated) : 73  Heparin dosing weight: 83.7 kg  Vital Signs: Temp: 100.4 F (38 C) (03/09 0323) Temp Source: Oral (03/09 0323) BP: 138/77 (03/09 0323) Pulse Rate: 95 (03/09 0323)  Labs: Recent Labs    05/23/17 0315 05/24/17 0312 05/25/17 0220  HGB 9.1* 8.2* 9.4*  HCT 29.0* 26.3* 29.3*  PLT 330 350 331  HEPARINUNFRC 0.45 <0.10* 0.53  CREATININE 1.89* 1.70* 1.54*   Assessment: [name] in the ICU after being "found down".Patient has been found to have bilateral lower extremity DVTs with concern for possible PE. Heparin drip held 3/1 evening due to bleeding around PEG site - resumed on 3/2 as this was resolved.   Heparin now therapeutic. Pt is s/p bilateral AKA.  Goal of Therapy:  Heparin level 0.3-0.7 units/ml Monitor platelets by anticoagulation protocol: Yes    Plan:  Continue heparin at 1650 units/hr Daily HL, CBC   Baldemar FridayMasters, Ayodele Hartsock M 05/25/2017 4:01 AM

## 2017-05-25 NOTE — Progress Notes (Signed)
Patient ID: Joseph FloridaLarry E Downs, male   DOB: 02-Apr-1950, 67 y.o.   MRN: [id number] Dressings intact bilateral above-knee amputation.  Patient is totally noncommunicative.  Does not appear to be having pain.  Will check amputation sites tomorrow.

## 2017-05-25 NOTE — Progress Notes (Addendum)
PROGRESS NOTE    Joseph Downs  JKD:[id number] DOB: 1950-05-03 DOA: 05/01/2017 PCP: Patient, No Pcp Per   Brief Narrative:  Joseph Downs is a 67 yo M, reportedly homeless, no past medical history that I can find from his recent admission to our service other than obstructive uropathy requiring foley.  Evidently, on 2/13, he was found down in the cold.  Brought to the ER, hypotensive, had Lactate >7 despite 30 cc/kg IVF and re-warming, was admitted to ICU, at that time was mentating enough to explain that he had passed out, not clear why.  That night, developed respiratory distress requiring intubation and hypotension. Following that, was treated with broad spectrum antibiotics for hypotension, tachycardia and leukocytosis. Renal failure, creatinine up to 7. Started on CVVHD. Blood cultures grew Acineto lwoffii, Urine cx frew staph lugdunensis.  Echo showed severe RV strain and bilateral LE dopplers showed extensive DVT.  CTA not possible, nor catheterization/lysis due to AKI.  Started on heparin.  At time of transfer out of ICU to [facility] service, patient has tolerated 2 days intermittent trach collar, back on vent at night.  Has PEG.  Cardiology, Nephrology, Vascular surgery, and Pulm are following. S/p B/L AKA  Assessment & Plan   Possible sepsis -Overnight patient developed fever, leukocytosis, tachycardia, tachypnea -Possibly reactive to recent surgery versus infection -Obtain chest x-ray, blood cultures, UA and culture -Will change IV fluids -Will wait on starting antibiotics pending workup  Acute respiratory failure -PCCM follow as patient has trach collar  Presumed massive pulmonary embolism/Bilateral DVT -Status post IVC filter-placed due to doubts of patient's future compliance with oral anticoagulation -Continue heparin -will discuss with vascular surgery when coumadin or NOAC can be started  Acute kidney injury -History of recent obstructive uropathy. ?  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/urine cutlure)- needs an outpatient work up -She did have a Foley catheter previous hospitalization -Creatinine currently appears to be improving, creatinine today 1.54 -Nephrology consulted and appreciated and is now signed off  Bacteremia -Cultures from 05/01/2017 showed diphtheroids, Acinetobacter lwoffii -Repeat blood cultures show no growth -Patient was placed on vancomycin and Zosyn however then transitioned to meropenem  Hypernatremia -Continue free water, sodium 147 -Will change IV fluid to D5 water -Continue to monitor BMP  Acute metabolic encephalopathy -Appears to be improving slowly -Avoid benzodiazepines -continue Haldol  NSTEMI -Cardiology consulted and appreciated, continue aspirin -Possibly related to PE  Normocytic Anemia -Unclear etiology -Was transfused 1 unit PRBC earlier this hospitalization -Hemoglobin currently 9.4 -Continue to monitor CBC  Presumed frostbite/Gangrene of feet -Vascular surgery consulted and appreciated -Wound care consulted -Patient may need bilateral lower extremity amputations -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  Discussed with sister on 05/22/2017 via phone.  Disposition Plan: admitted. Dispo pending PT/OT, continues to be on heparin, will need to either bridge to coumadin or start [facility] Vascular surgery  Procedures  Intubated/extubated on 05/01/2017 through 05/15/2017 IVC filter placement 05/10/2017 Trach placed on 05/15/2017 PEG placed 05/16/2017 Bilateral AKA 05/24/2017  Antibiotics   Anti-infectives (From admission, onward)   Start     Dose/Rate Route Frequency Ordered Stop   05/24/17 1800  ceFAZolin (ANCEF) IVPB 2g/100 mL premix     2 g 200 mL/hr over 30 Minutes Intravenous Every 8 hours 05/24/17 1300 05/25/17 0238   05/07/17 2200   meropenem (MERREM) 500 mg in  sodium chloride 0.9 % 100 mL IVPB     500 mg 200 mL/hr over 30 Minutes Intravenous Every 24 hours 05/07/17 1306 05/19/17 1911   05/07/17 0930  meropenem (MERREM) 1 g in sodium chloride 0.9 % 100 mL IVPB  Status:  Discontinued     1 g 200 mL/hr over 30 Minutes Intravenous Every 12 hours 05/07/17 0908 05/07/17 1306   05/06/17 1300  meropenem (MERREM) 500 mg in sodium chloride 0.9 % 100 mL IVPB  Status:  Discontinued     500 mg 200 mL/hr over 30 Minutes Intravenous Every 24 hours 05/06/17 1146 05/07/17 0908   05/04/17 2200  vancomycin (VANCOCIN) IVPB 1000 mg/200 mL premix     1,000 mg 200 mL/hr over 60 Minutes Intravenous Every 24 hours 05/04/17 0933 05/07/17 2339   05/04/17 1400  piperacillin-tazobactam (ZOSYN) IVPB 3.375 g  Status:  Discontinued     3.375 g 100 mL/hr over 30 Minutes Intravenous Every 6 hours 05/04/17 0932 05/06/17 1146   05/03/17 1300  vancomycin (VANCOCIN) IVPB 1000 mg/200 mL premix  Status:  Discontinued     1,000 mg 200 mL/hr over 60 Minutes Intravenous Every 48 hours 05/02/17 1340 05/04/17 0933   05/02/17 2000  piperacillin-tazobactam (ZOSYN) IVPB 2.25 g  Status:  Discontinued     2.25 g 100 mL/hr over 30 Minutes Intravenous Every 6 hours 05/02/17 1342 05/04/17 0932   05/02/17 1300  vancomycin (VANCOCIN) IVPB 1000 mg/200 mL premix  Status:  Discontinued     1,000 mg 200 mL/hr over 60 Minutes Intravenous Every 24 hours 05/01/17 1313 05/02/17 1340   05/01/17 2000  piperacillin-tazobactam (ZOSYN) IVPB 3.375 g  Status:  Discontinued     3.375 g 12.5 mL/hr over 240 Minutes Intravenous Every 8 hours 05/01/17 1313 05/02/17 1342   05/01/17 1200  vancomycin (VANCOCIN) 1,750 mg in sodium chloride 0.9 % 500 mL IVPB     1,750 mg 250 mL/hr over 120 Minutes Intravenous  Once 05/01/17 1121 05/01/17 1409   05/01/17 1130  piperacillin-tazobactam (ZOSYN) IVPB 3.375 g     3.375 g 100 mL/hr over 30 Minutes Intravenous  Once 05/01/17 1119 05/01/17 1217    05/01/17 1130  vancomycin (VANCOCIN) IVPB 1000 mg/200 mL premix  Status:  Discontinued     1,000 mg 200 mL/hr over 60 Minutes Intravenous  Once 05/01/17 1119 05/01/17 1121      Subjective:   Joseph Downs seen and examined today. Not interactive today.   Objective:   Vitals:   05/25/17 0323 05/25/17 0537 05/25/17 0727 05/25/17 0751  BP: 138/77  (!) 148/74 (!) 148/74  Pulse: 95  98 100  Resp: (!) 33  (!) 29   Temp: (!) 100.4 F (38 C)  98.8 F (37.1 C)   TempSrc: Oral  Axillary   SpO2: 100%  100%   Weight:  72.6 kg (160 lb)    Height:        Intake/Output Summary (Last 24 hours) at 05/25/2017 1001 Last data filed at 05/25/2017 0727 Gross per 24 hour  Intake 5873.13 ml  Output 4075 ml  Net 1798.13 ml   Filed Weights   05/22/17 0500 05/24/17 0420 05/25/17 0537  Weight: 85.6 kg (188 lb 11.4 oz) 86.2 kg (190 lb) 72.6 kg (160 lb)   Exam  General: Well developed, well nourished, NAD, appears stated age  HEENT: NCAT, mucous membranes moist.   Neck: +trach  Cardiovascular: S1 S2 auscultated, tachycardic, no murmur  Respiratory: Clear to auscultation bilaterally with equal chest  rise  Abdomen: Soft, nontender, nondistended, + bowel sounds, peg, abdominal binder  Extremities: B/L AKA with dressings in place  Neuro: awake however not interactive this morning. Moves extremities   Data Reviewed: I have personally reviewed following labs and imaging studies  CBC: Recent Labs  Lab 05/21/17 0906 05/22/17 0305 05/23/17 0315 05/24/17 0312 05/25/17 0220  WBC 13.3* 13.8* 13.2* 12.9* 16.5*  HGB 8.4* 8.4* 9.1* 8.2* 9.4*  HCT 27.0* 27.3* 29.0* 26.3* 29.3*  MCV 95.7 94.1 94.2 94.6 91.6  PLT 355 376 330 350 423   Basic Metabolic Panel: Recent Labs  Lab 05/21/17 0906 05/22/17 0305 05/23/17 0315 05/24/17 0312 05/25/17 0220  NA 150* 147* 145 147* 147*  K 4.2 4.1 4.3 4.4 4.0  CL 123* 117* 116* 113* 113*  CO2 19* 20* 21* 23 23  GLUCOSE 132* 135* 117* 99 139*  BUN 57*  51* 45* 37* 28*  CREATININE 2.22* 2.08* 1.89* 1.70* 1.54*  CALCIUM 8.0* 8.0* 7.9* 8.0* 7.2*  PHOS 3.1 2.6 3.3 3.8 3.2   GFR: Estimated Creatinine Clearance: 48.5 mL/min (A) (by C-G formula based on SCr of 1.54 mg/dL (H)). Liver Function Tests: Recent Labs  Lab 05/21/17 0906 05/22/17 0305 05/23/17 0315 05/24/17 0312 05/25/17 0220  ALBUMIN 1.6* 1.6* 1.7* 1.8* 2.0*   No results for input(s): LIPASE, AMYLASE in the last 168 hours. No results for input(s): AMMONIA in the last 168 hours. Coagulation Profile: No results for input(s): INR, PROTIME in the last 168 hours. Cardiac Enzymes: No results for input(s): CKTOTAL, CKMB, CKMBINDEX, TROPONINI in the last 168 hours. BNP (last 3 results) No results for input(s): PROBNP in the last 8760 hours. HbA1C: No results for input(s): HGBA1C in the last 72 hours. CBG: Recent Labs  Lab 05/24/17 1653 05/24/17 1932 05/24/17 2339 05/25/17 0320 05/25/17 0812  GLUCAP 127* 128* 121* 128* 131*   Lipid Profile: No results for input(s): CHOL, HDL, LDLCALC, TRIG, CHOLHDL, LDLDIRECT in the last 72 hours. Thyroid Function Tests: No results for input(s): TSH, T4TOTAL, FREET4, T3FREE, THYROIDAB in the last 72 hours. Anemia Panel: No results for input(s): VITAMINB12, FOLATE, FERRITIN, TIBC, IRON, RETICCTPCT in the last 72 hours. Urine analysis:    Component Value Date/Time   COLORURINE AMBER (A) 05/01/2017 1313   APPEARANCEUR TURBID (A) 05/01/2017 1313   LABSPEC 1.014 05/01/2017 1313   PHURINE 5.0 05/01/2017 1313   GLUCOSEU 50 (A) 05/01/2017 1313   HGBUR MODERATE (A) 05/01/2017 1313   BILIRUBINUR NEGATIVE 05/01/2017 1313   KETONESUR NEGATIVE 05/01/2017 1313   PROTEINUR 100 (A) 05/01/2017 1313   NITRITE NEGATIVE 05/01/2017 1313   LEUKOCYTESUR MODERATE (A) 05/01/2017 1313   Sepsis Labs: _0 (procalcitonin:4,lacticidven:4)  )No results found for this or any previous visit (from the past 240 hour(s)).    Radiology Studies: Ct Head  Wo Contrast  Result Date: 05/23/2017 CLINICAL DATA:  67 year old currently anticoagulated with heparin who fell out of his bed and sustained minor head trauma. EXAM: CT HEAD WITHOUT CONTRAST TECHNIQUE: Contiguous axial images were obtained from the base of the skull through the vertex without intravenous contrast. COMPARISON:  07/08/2015. FINDINGS: Brain: Ventricular system normal in size and appearance for age. Mild age related cortical atrophy. No mass lesion. No midline shift. No acute hemorrhage or hematoma. No extra-axial fluid collections. No evidence of acute infarction. Physiologic calcifications in the basal ganglia. Vascular: No visible atherosclerosis involving the carotid siphons or the vertebrobasilar system. No hyperdense vessel. Skull: No skull fracture or other focal osseous abnormality involving the skull. Sinuses/Orbits:  Visualized paranasal sinuses, bilateral mastoid air cells and bilateral middle ear cavities well-aerated. 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 Visualized orbits and globes normal in appearance. Other: None. IMPRESSION: No acute intracranial abnormality. Electronically Signed   By: Evangeline Dakin M.D.   On: 05/23/2017 17:27   Dg Chest Port 1 View  Result Date: 05/25/2017 CLINICAL DATA:  Sepsis EXAM: PORTABLE CHEST 1 VIEW COMPARISON:  May 17, 2017 FINDINGS: The tracheostomy tube is in good position. The heart, hila, and mediastinum are normal. Mild bibasilar atelectasis. The lungs are otherwise clear. No other acute abnormalities. IMPRESSION: Tracheostomy tube.  No cause for sepsis identified. Electronically Signed   By: Dorise Bullion III M.D   On: 05/25/2017 07:57     Scheduled Meds: . aspirin  81 mg Per Tube Daily  . chlorhexidine gluconate (MEDLINE KIT)  15 mL Mouth Rinse BID  . docusate sodium  100 mg Oral Daily  . free water  250 mL Per Tube Q6H  . haloperidol  1 mg Oral Daily  . haloperidol  2 mg Oral QHS  . insulin aspart  2-6 Units  Subcutaneous Q4H  . ipratropium-albuterol  3 mL Nebulization BID  . mouth rinse  15 mL Mouth Rinse QID  . Melatonin  6 mg Oral QHS  . pantoprazole sodium  40 mg Per Tube QHS  . silver sulfADIAZINE   Topical Daily  . sodium chloride flush  10-40 mL Intracatheter Q12H  . thiamine  100 mg Per Tube Daily   Continuous Infusions: . sodium chloride 50 mL/hr at 05/25/17 0727  . feeding supplement (VITAL AF 1.2 CAL) 1,000 mL (05/25/17 0727)  . heparin 1,650 Units/hr (05/25/17 0727)  . lactated ringers Stopped (05/24/17 1900)  . magnesium sulfate 1 - 4 g bolus IVPB       LOS: 24 days   Time Spent in minutes   45 minutes  Knox Holdman D.O. on 05/25/2017 at 10:01 AM  Between 7am to 7pm - Pager - [phone]  After 7pm go to [email or web address] - password [email or web address]  And look for the night coverage person covering for me after hours  [facility] Office  [phone]

## 2017-05-25 NOTE — Progress Notes (Signed)
ANTICOAGULATION CONSULT NOTE - Follow Up Consult  Pharmacy Consult for Heparin Indication: DVT/PE  No Known Allergies  Patient Measurements: Height: 5\' 10"  (177.8 cm) Weight: 160 lb (72.6 kg) IBW/kg (Calculated) : 73 Heparin Dosing Weight:  72.6 kg  Vital Signs: Temp: 100.5 F (38.1 C) (03/09 1100) Temp Source: Oral (03/09 1100) BP: 140/78 (03/09 1147) Pulse Rate: 100 (03/09 1147)  Labs: Recent Labs    05/23/17 0315 05/24/17 0312 05/25/17 0220 05/25/17 1018  HGB 9.1* 8.2* 9.4*  --   HCT 29.0* 26.3* 29.3*  --   PLT 330 350 331  --   HEPARINUNFRC 0.45 <0.10* 0.53 0.70  CREATININE 1.89* 1.70* 1.54*  --     Estimated Creatinine Clearance: 48.5 mL/min (A) (by C-G formula based on SCr of 1.54 mg/dL (H)).  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 2/22 IVC filter placed. Heparin level 0.53 in goal, confirmed 0.7. Hgb improved to 9.4. Pltd 331 stable. -Hep held 3/1 PM d/t oozing around PEG site and drop in Hgb to 6.9 >> 8.4 s/p transfusion  Goal of Therapy:  Heparin level 0.3-0.7 units/ml Monitor platelets by anticoagulation protocol: Yes   Plan:  Heparin 1650 units/hr Daily HL and CBC   Joseph Downs, PharmD, BCPS Clinical Staff Pharmacist Pager [phone]  Joseph Downs, Joseph Downs 05/25/2017,11:55 AM

## 2017-05-25 NOTE — Progress Notes (Signed)
Tylenol 650mg  given for oral temp of 100.5, will continue to monitor

## 2017-05-25 NOTE — Progress Notes (Signed)
Paged Dr Catha GosselinMikhail reguarding temp of 100.5 and increase in heart rate to low 100's

## 2017-05-25 NOTE — Consult Note (Signed)
Physical Medicine and Rehabilitation Consult Reason for Consult: Status post amputation Referring Physician: Emilie RutterMatthew Eveland, PA-C   HPI: Joseph Downs is a 67 y.o. male with unknown past medical history presented on 2/13 after being found down. History taken from chart review. Patient is homeless He was found to be taking antihypertensive as well as hypothermic. He was started on IV fluids, IV antibiotics as well as a warmer. He was able to communicate at that time. He was noted to have renal failure and started on CKD ADHD. He was found to have bacteremia. He was transferred to ICU. He was noted to be in respiratory distress and tight. He was found to have bilateral lower extremity DVTs, presumed pulmonary embolism and frostbite on both feet. Vascular surgery was consulted. He was found to have SIRS and workup initiated. Hospital course further complicated by hypernatremia, NSTEMI, acute blood loss anemia, postop pain, encephalopathy.   Review of Systems  Unable to perform ROS: Mental acuity   No past medical history on file, unable to obtain from patient. Past Surgical History:  Procedure Laterality Date  . AMPUTATION Bilateral 05/24/2017   Procedure: Bilateral AMPUTATION ABOVE KNEE;  Surgeon: Fransisco Hertzhen, Brian L, MD;  [facility];  Service: Vascular;  Laterality: Bilateral;  . ESOPHAGOGASTRODUODENOSCOPY N/A 05/16/2017   Procedure: ESOPHAGOGASTRODUODENOSCOPY (EGD);  Surgeon: Violeta Gelinashompson, Burke, MD;  [facility];  Service: Endoscopy;  Laterality: N/A;  . IR IVC FILTER PLMT / S&I Lenise Arena/IMG GUID/MOD SED  05/10/2017  . PEG PLACEMENT N/A 05/16/2017   Procedure: PERCUTANEOUS ENDOSCOPIC GASTROSTOMY (PEG) REPLACEMENT;  Surgeon: Violeta Gelinashompson, Burke, MD;  [facility];  Service: Endoscopy;  Laterality: N/A;   Family History  Problem Relation Age of Onset  . Cancer Father    Social History:  reports that  has never smoked. he has never used smokeless tobacco. He reports that he does  not drink alcohol or use drugs. Allergies: No Known Allergies Medications Prior to Admission  Medication Sig Dispense Refill  . finasteride (PROSCAR) 5 MG tablet Take 5 mg by mouth daily.    . tamsulosin (FLOMAX) 0.4 MG CAPS capsule Take 1 capsule (0.4 mg total) by mouth daily after supper. (Patient not taking: Reported on 05/01/2017) 30 capsule 1    Home: Home Living Family/patient expects to be discharged to:: Unsure Additional Comments: pt is homeless per RN  Functional History: Prior Function Level of Independence: Independent Functional Status:  Mobility: Bed Mobility Overal bed mobility: Needs Assistance Bed Mobility: Supine to Sit, Sit to Supine Supine to sit: Max assist, HOB elevated Sit to supine: +2 for physical assistance, Max assist General bed mobility comments: Assist for LEs and elevation of trunk Transfers Overall transfer level: Needs assistance Transfers: Anterior-Posterior Transfer Anterior-Posterior transfers: Total assist, Max assist, +2 physical assistance General transfer comment: pt attempted to assist leaning forward and using UEs to unweight buttocks but needed assist and cues with use of pad      ADL: ADL Overall ADL's : Needs assistance/impaired Eating/Feeding: NPO Grooming: Wash/dry face, Bed level, Minimal assistance Upper Body Bathing: Maximal assistance, Sitting Lower Body Bathing: Total assistance, Bed level Upper Body Dressing : Maximal assistance, Sitting Lower Body Dressing: Total assistance, Bed level Toileting- Clothing Manipulation and Hygiene: Total assistance, Bed level General ADL Comments: Pt sat EOB x 7 minutes with moderate assist.   Cognition: Cognition Overall Cognitive Status: Difficult to assess Orientation Level: Oriented to person Cognition Arousal/Alertness: Awake/alert Behavior During Therapy: [facility] for tasks assessed/performed Overall Cognitive  Status: Difficult to assess General Comments: pt following commands,  yes/no appears to be reliable, pt with poor safety and awareness of deficits Difficult to assess due to: Tracheostomy  Blood pressure 140/78, pulse 100, temperature (!) 100.5 F (38.1 C), temperature source Oral, resp. rate (!) 30, height 5\' 10"  (1.778 m), weight 72.6 kg (160 lb), SpO2 100 %. Physical Exam  Constitutional: He appears well-developed.  Cachectic  HENT:  Head: Normocephalic and atraumatic.  Eyes: Right eye exhibits no discharge. Left eye exhibits no discharge. Scleral icterus is present.  Eyes appeared to be limited to right field  Neck:  + Trach  Cardiovascular: Normal rate and regular rhythm.  Respiratory: Breath sounds normal.  Increased work of breathing  GI: Soft. Bowel sounds are normal.  Musculoskeletal:  Bilateral lower extremity stump edema and tenderness  Neurological: He is alert.  Exam limited due to patient being nonverbal and not following commands. Motor? Bilateral upper extremities shoulder abduction, elbow flexion/extension to -/5, hand grip 2+/5 Bilateral lower extremities: Limited by pain  Skin:  Bilateral lower extremities stumps with dressing C/D/I  Psychiatric:  Unable to assess due to limitation    Results for orders placed or performed during the hospital encounter of 05/01/17 (from the past 24 hour(s))  Glucose, capillary     Status: Abnormal   Collection Time: 05/24/17  1:07 PM  Result Value Ref Range   Glucose-Capillary 103 (H) 65 - 99 mg/dL   Comment 1 Notify RN    Comment 2 Document in Chart   Glucose, capillary     Status: Abnormal   Collection Time: 05/24/17  4:53 PM  Result Value Ref Range   Glucose-Capillary 127 (H) 65 - 99 mg/dL   Comment 1 Notify RN    Comment 2 Document in Chart   Glucose, capillary     Status: Abnormal   Collection Time: 05/24/17  7:32 PM  Result Value Ref Range   Glucose-Capillary 128 (H) 65 - 99 mg/dL   Comment 1 Notify RN   Glucose, capillary     Status: Abnormal   Collection Time: 05/24/17 11:39  PM  Result Value Ref Range   Glucose-Capillary 121 (H) 65 - 99 mg/dL   Comment 1 Notify RN   Heparin level (unfractionated)     Status: None   Collection Time: 05/25/17  2:20 AM  Result Value Ref Range   Heparin Unfractionated 0.53 0.30 - 0.70 IU/mL  Renal function panel     Status: Abnormal   Collection Time: 05/25/17  2:20 AM  Result Value Ref Range   Sodium 147 (H) 135 - 145 mmol/L   Potassium 4.0 3.5 - 5.1 mmol/L   Chloride 113 (H) 101 - 111 mmol/L   CO2 23 22 - 32 mmol/L   Glucose, Bld 139 (H) 65 - 99 mg/dL   BUN 28 (H) 6 - 20 mg/dL   Creatinine, Ser 4.09 (H) 0.61 - 1.24 mg/dL   Calcium 7.2 (L) 8.9 - 10.3 mg/dL   Phosphorus 3.2 2.5 - 4.6 mg/dL   Albumin 2.0 (L) 3.5 - 5.0 g/dL   GFR calc non Af Amer 45 (L) >60 mL/min   GFR calc Af Amer 53 (L) >60 mL/min   Anion gap 11 5 - 15  CBC     Status: Abnormal   Collection Time: 05/25/17  2:20 AM  Result Value Ref Range   WBC 16.5 (H) 4.0 - 10.5 K/uL   RBC 3.20 (L) 4.22 - 5.81 MIL/uL   Hemoglobin 9.4 (  L) 13.0 - 17.0 g/dL   HCT 16.1 (L) 09.6 - 04.5 %   MCV 91.6 78.0 - 100.0 fL   MCH 29.4 26.0 - 34.0 pg   MCHC 32.1 30.0 - 36.0 g/dL   RDW 40.9 81.1 - 91.4 %   Platelets 331 150 - 400 K/uL  Glucose, capillary     Status: Abnormal   Collection Time: 05/25/17  3:20 AM  Result Value Ref Range   Glucose-Capillary 128 (H) 65 - 99 mg/dL   Comment 1 Notify RN   Urinalysis, Routine w reflex microscopic     Status: Abnormal   Collection Time: 05/25/17  6:54 AM  Result Value Ref Range   Color, Urine YELLOW YELLOW   APPearance CLEAR CLEAR   Specific Gravity, Urine 1.013 1.005 - 1.030   pH 6.0 5.0 - 8.0   Glucose, UA 50 (A) NEGATIVE mg/dL   Hgb urine dipstick MODERATE (A) NEGATIVE   Bilirubin Urine NEGATIVE NEGATIVE   Ketones, ur NEGATIVE NEGATIVE mg/dL   Protein, ur NEGATIVE NEGATIVE mg/dL   Nitrite POSITIVE (A) NEGATIVE   Leukocytes, UA TRACE (A) NEGATIVE   RBC / HPF 6-30 0 - 5 RBC/hpf   WBC, UA 0-5 0 - 5 WBC/hpf   Bacteria, UA  FEW (A) NONE SEEN   Squamous Epithelial / LPF 0-5 (A) NONE SEEN   Mucus PRESENT    Hyaline Casts, UA PRESENT   Glucose, capillary     Status: Abnormal   Collection Time: 05/25/17  8:12 AM  Result Value Ref Range   Glucose-Capillary 131 (H) 65 - 99 mg/dL   Comment 1 Notify RN    Comment 2 Document in Chart   Heparin level (unfractionated)     Status: None   Collection Time: 05/25/17 10:18 AM  Result Value Ref Range   Heparin Unfractionated 0.70 0.30 - 0.70 IU/mL  Glucose, capillary     Status: Abnormal   Collection Time: 05/25/17 11:51 AM  Result Value Ref Range   Glucose-Capillary 148 (H) 65 - 99 mg/dL   Ct Head Wo Contrast  Result Date: 05/23/2017 CLINICAL DATA:  68 year old currently anticoagulated with heparin who fell out of his bed and sustained minor head trauma. EXAM: CT HEAD WITHOUT CONTRAST TECHNIQUE: Contiguous axial images were obtained from the base of the skull through the vertex without intravenous contrast. COMPARISON:  07/08/2015. FINDINGS: Brain: Ventricular system normal in size and appearance for age. Mild age related cortical atrophy. No mass lesion. No midline shift. No acute hemorrhage or hematoma. No extra-axial fluid collections. No evidence of acute infarction. Physiologic calcifications in the basal ganglia. Vascular: No visible atherosclerosis involving the carotid siphons or the vertebrobasilar system. No hyperdense vessel. Skull: No skull fracture or other focal osseous abnormality involving the skull. Sinuses/Orbits: Visualized paranasal sinuses, bilateral mastoid air cells and bilateral middle ear cavities well-aerated. 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 Visualized orbits and globes normal in appearance. Other: None. IMPRESSION: No acute intracranial abnormality. Electronically Signed   By: Hulan Saas M.D.   On: 05/23/2017 17:27   Dg Chest Port 1 View  Result Date: 05/25/2017 CLINICAL DATA:  Sepsis EXAM: PORTABLE CHEST 1 VIEW  COMPARISON:  May 17, 2017 FINDINGS: The tracheostomy tube is in good position. The heart, hila, and mediastinum are normal. Mild bibasilar atelectasis. The lungs are otherwise clear. No other acute abnormalities. IMPRESSION: Tracheostomy tube.  No cause for sepsis identified. Electronically Signed   By: Gerome Sam III M.D   On: 05/25/2017  07:57    Assessment/Plan: Diagnosis: Bilateral AKA with encephalopathy Labs and images independently reviewed.  Records reviewed and summated above. Clean amputation daily with soap and water Monitor incision site for signs of infection or impending skin breakdown. Staples to remain in place for 3-4 weeks Stump shrinker, for edema control  Scar mobilization massaging to prevent soft tissue adherence Stump protector during therapies Prevent flexion contractures by implementing the following:   Encourage prone lying for 20-30 mins per day BID to avoid hip flexion  Contractures if medically appropriate;  Avoid prolonged sitting Post surgical pain control with oral medication Phantom limb pain control with physical modalities including desensitization techniques (gentle self massage to the residual stump,hot packs if sensation intact, [place]) and mirror therapy, TENS. If ineffective, consider pharmacological treatment for neuropathic pain (e.g gabapentin, pregabalin, amytriptalyine, duloxetine).  When using wheelchair, patient should have knee on amputated side fully extended with board under the seat cushion.  1. Does the need for close, 24 hr/day medical supervision in concert with the patient's rehab needs make it unreasonable for this patient to be served in a less intensive setting? Yes  2. Co-Morbidities requiring supervision/potential complications: hypernatremia (continue to monitor, treated necessary), NSTEMI (recs per Cards), acute blood loss anemia (transfuse if necessary to ensure appropriate perfusion for increased activity tolerance), encephalopathy,  postop pain (Biofeedback training with therapies to help reduce reliance on opiate pain medications, particularly IV morphine, monitor pain control during therapies, and sedation at rest and titrate to maximum efficacy to ensure participation and gains in therapies), pulmonary embolism (continue heparin GGT, transition to oral medications when appropriate), SIRS (cont to monitor for signs and symptoms of infection, further workup if indicated), AKI (avoid nephrotoxic meds), respiratory failure 3. Due to bladder management, bowel management, safety, skin/wound care, disease management, medication administration, pain management and patient education, does the patient require 24 hr/day rehab nursing? Yes 4. Does the patient require coordinated care of a physician, rehab nurse, PT (1-2 hrs/day, 5 days/week), OT (1-2 hrs/day, 5 days/week) and SLP (1-2 hrs/day, 5 days/week) to address physical and functional deficits in the context of the above medical diagnosis(es)? Yes Addressing deficits in the following areas: balance, endurance, locomotion, strength, transferring, bowel/bladder control, bathing, dressing, feeding, grooming, toileting, cognition, speech, language, swallowing and psychosocial support 5. Can the patient actively participate in an intensive therapy program of at least 3 hrs of therapy per day at least 5 days per week? No 6. The potential for patient to make measurable gains while on inpatient rehab is NA 7. Anticipated functional outcomes upon discharge from inpatient rehab are n/a  with PT, n/a with OT, n/a with SLP. 8. Estimated rehab length of stay to reach the above functional goals is: NA 9. Anticipated D/C setting: SNF 10. Anticipated post D/C treatments: SNF 11. Overall Rehab/Functional Prognosis: good  RECOMMENDATIONS: This patient's condition is appropriate for continued rehabilitative care in the following setting: Will await completion of therapy evaluations and medical  appropriateness, however given current situation and functional limitations, anticipate SNF.Marland Kitchen Patient has agreed to participate in recommended program. Potentially Note that insurance prior authorization may be required for reimbursement for recommended care.  Comment: Rehab Admissions Coordinator to follow up.  Maryla Morrow, MD, ABPMR 05/25/2017

## 2017-05-25 NOTE — Progress Notes (Signed)
PT Cancellation Note  Patient Details Name: Joseph Downs MRN: [id number] DOB: 07/04/1950   Cancelled Treatment:    Reason Eval/Treat Not Completed: Medical issues which prohibited therapy. Spoke with RN. Pt not medically ready for re-initiation of PT at this time. He underwent BLE AKAs yesterday. He is currently running a fever. Morphine also given due to s/s of pain (increased HR, facial grimacing). Pt with little to no verbalizations today. RN recommends holding PT until Monday. PT to follow up at that time.   Ilda FoilGarrow, Wafa Martes Rene 05/25/2017, 11:48 AM   Aida RaiderWendy Rome Schlauch, PT  Office # [phone] Pager [phone]

## 2017-05-25 NOTE — Progress Notes (Signed)
OT Cancellation Note  Patient Details Name: Joseph FloridaLarry E Monterroso MRN: [id number] DOB: 1950-07-24   Cancelled Treatment:    Reason Eval/Treat Not Completed: Patient not medically ready. Spoke with RN. Pt not medically ready for re-initiation of PT at this time. He underwent BLE AKAs yesterday. He is currently running a fever. Morphine also given due to s/s of pain (increased HR, facial grimacing). Pt with little to no verbalizations today. RN recommends holding OT until Monday. OT to follow up at that time.  Evern BioLaura J Azriel Dancy 05/25/2017, 1:25 PM  Sherryl MangesLaura Shizuko Wojdyla OTR/L [phone]

## 2017-05-26 LAB — GLUCOSE, CAPILLARY
GLUCOSE-CAPILLARY: 162 mg/dL — AB (ref 65–99)
GLUCOSE-CAPILLARY: 138 mg/dL — AB (ref 65–99)
GLUCOSE-CAPILLARY: 128 mg/dL — AB (ref 65–99)
GLUCOSE-CAPILLARY: 143 mg/dL — AB (ref 65–99)
Glucose-Capillary: 136 mg/dL — ABNORMAL HIGH (ref 65–99)
Glucose-Capillary: 115 mg/dL — ABNORMAL HIGH (ref 65–99)

## 2017-05-26 LAB — PREPARE RBC (CROSSMATCH)

## 2017-05-26 LAB — RENAL FUNCTION PANEL
Albumin: 1.6 g/dL — ABNORMAL LOW (ref 3.5–5.0)
Anion gap: 9 (ref 5–15)
BUN: 28 mg/dL — AB (ref 6–20)
CALCIUM: 6.7 mg/dL — AB (ref 8.9–10.3)
CHLORIDE: 106 mmol/L (ref 101–111)
CO2: 22 mmol/L (ref 22–32)
CREATININE: 1.37 mg/dL — AB (ref 0.61–1.24)
GFR calc Af Amer: >60 mL/min (ref >60)
GFR calc non Af Amer: 52 mL/min — ABNORMAL LOW (ref >60)
GLUCOSE: 218 mg/dL — AB (ref 65–99)
Phosphorus: 2.7 mg/dL (ref 2.5–4.6)
Potassium: 3.6 mmol/L (ref 3.5–5.1)
SODIUM: 137 mmol/L (ref 135–145)

## 2017-05-26 LAB — CBC
HCT: 24 % — ABNORMAL LOW (ref 39.0–52.0)
Hemoglobin: 7.7 g/dL — ABNORMAL LOW (ref 13.0–17.0)
MCH: 29.7 pg (ref 26.0–34.0)
MCHC: 32.1 g/dL (ref 30.0–36.0)
MCV: 92.7 fL (ref 78.0–100.0)
PLATELETS: 251 10*3/uL (ref 150–400)
RBC: 2.59 MIL/uL — AB (ref 4.22–5.81)
RDW: 15.7 % — ABNORMAL HIGH (ref 11.5–15.5)
WBC: 19 10*3/uL — ABNORMAL HIGH (ref 4.0–10.5)

## 2017-05-26 LAB — BASIC METABOLIC PANEL
Anion gap: 10 (ref 5–15)
BUN: 29 mg/dL — AB (ref 6–20)
CALCIUM: 7 mg/dL — AB (ref 8.9–10.3)
CHLORIDE: 109 mmol/L (ref 101–111)
CO2: 21 mmol/L — AB (ref 22–32)
CREATININE: 1.55 mg/dL — AB (ref 0.61–1.24)
GFR calc Af Amer: 52 mL/min — ABNORMAL LOW (ref >60)
GFR calc non Af Amer: 45 mL/min — ABNORMAL LOW (ref >60)
GLUCOSE: 140 mg/dL — AB (ref 65–99)
Potassium: 3.8 mmol/L (ref 3.5–5.1)
Sodium: 140 mmol/L (ref 135–145)

## 2017-05-26 LAB — HEPARIN LEVEL (UNFRACTIONATED): HEPARIN UNFRACTIONATED: 0.33 [IU]/mL (ref 0.30–0.70)

## 2017-05-26 MED ORDER — SODIUM CHLORIDE 0.9 % IV SOLN
Freq: Once | INTRAVENOUS | Status: AC
  Administered 2017-05-26: 09:00:00 via INTRAVENOUS

## 2017-05-26 MED ORDER — WHITE PETROLATUM EX OINT
TOPICAL_OINTMENT | CUTANEOUS | Status: AC
  Administered 2017-05-26: 23:00:00
  Filled 2017-05-26: qty 28.35

## 2017-05-26 MED ORDER — FREE WATER
250.0000 mL | Freq: Three times a day (TID) | Status: DC
  Administered 2017-05-26 – 2017-06-13 (×51): 250 mL

## 2017-05-26 NOTE — Progress Notes (Signed)
Found pt w/ trach cuff inflated.  After discussion w/ unit RT, will leave cuff inflated for now. RN aware.

## 2017-05-26 NOTE — Progress Notes (Signed)
Son at bedside.

## 2017-05-26 NOTE — Progress Notes (Signed)
ANTICOAGULATION CONSULT NOTE - Follow Up Consult  Pharmacy Consult for Heparin Indication: DVT/PE  No Known Allergies  Patient Measurements: Height: 5\' 10"  (177.8 cm) Weight: 161 lb (73 kg) IBW/kg (Calculated) : 73 Heparin Dosing Weight:  72.6 kg  Vital Signs: Temp: 98.2 F (36.8 C) (03/10 1122) Temp Source: Axillary (03/10 1122) BP: 139/66 (03/10 1156) Pulse Rate: 97 (03/10 1156)  Labs: Recent Labs    05/24/17 0312 05/25/17 0220 05/25/17 1018 05/26/17 0338 05/26/17 0950 05/26/17 1327  HGB 8.2* 9.4*  --  7.7*  --   --   HCT 26.3* 29.3*  --  24.0*  --   --   PLT 350 331  --  251  --   --   HEPARINUNFRC <0.10* 0.53 0.70  --   --  0.33  CREATININE 1.70* 1.54*  --  1.55* 1.37*  --     Estimated Creatinine Clearance: 54.8 mL/min (A) (by C-G formula based on SCr of 1.37 mg/dL (H)).  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 2/22 IVC filter placed. Heparin level 0.33 in goal  Range on heparin drip 1650 uts/hr  Hgb fell slightly 7.7  - watch Pltc stable. -Hep held 3/1 PM d/t oozing around PEG site and drop in Hgb to 6.9 >> 8.4 s/p transfusion  Goal of Therapy:  Heparin level 0.3-0.7 units/ml Monitor platelets by anticoagulation protocol: Yes   Plan:  Heparin 1650 units/hr Daily HL and CBC  Leota SauersLisa Durward Matranga Pharm.D. CPP, BCPS Clinical Pharmacist [phone] 05/26/2017 2:40 PM

## 2017-05-26 NOTE — Progress Notes (Addendum)
[facility]  Subjective  - Trach in place. Opens eyes some to verbal communication.   Objective (!) 147/80 (!) 101 97.7 F (36.5 C) (Axillary) (!) 34 99%  Intake/Output Summary (Last 24 hours) at 05/26/2017 1016 Last data filed at 05/26/2017 0900 Gross per 24 hour  Intake 4500.88 ml  Output 1650 ml  Net 2850.88 ml    Removed B AKA dressings.  Stump warm tot ouch, staples in place, stumps appear viable.  Assessment/Planning: POD # 2 B AKA Viable stumps B.  Will contact [employer] for stump socks.  Mosetta Pigeonmma Maureen Collins 05/26/2017 10:16 AM --  Laboratory Lab Results: Recent Labs    05/25/17 0220 05/26/17 0338  WBC 16.5* 19.0*  HGB 9.4* 7.7*  HCT 29.3* 24.0*  PLT 331 251   BMET Recent Labs    05/25/17 0220 05/26/17 0338  NA 147* 140  K 4.0 3.8  CL 113* 109  CO2 23 21*  GLUCOSE 139* 140*  BUN 28* 29*  CREATININE 1.54* 1.55*  CALCIUM 7.2* 7.0*    COAG Lab Results  Component Value Date   INR 1.21 05/10/2017   No results found for: PTT

## 2017-05-26 NOTE — Progress Notes (Signed)
PROGRESS NOTE    Joseph Downs  BVA:[id number] DOB: Sep 06, 1950 DOA: 05/01/2017 PCP: Patient, No Pcp Per   Brief Narrative:  Joseph Downs is a 67 yo M, reportedly homeless, no past medical history that I can find from his recent admission to our service other than obstructive uropathy requiring foley.  Evidently, on 2/13, he was found down in the cold.  Brought to the ER, hypotensive, had Lactate >7 despite 30 cc/kg IVF and re-warming, was admitted to ICU, at that time was mentating enough to explain that he had passed out, not clear why.  That night, developed respiratory distress requiring intubation and hypotension. Following that, was treated with broad spectrum antibiotics for hypotension, tachycardia and leukocytosis. Renal failure, creatinine up to 7. Started on CVVHD. Blood cultures grew Acineto lwoffii, Urine cx frew staph lugdunensis.  Echo showed severe RV strain and bilateral LE dopplers showed extensive DVT.  CTA not possible, nor catheterization/lysis due to AKI.  Started on heparin.  At time of transfer out of ICU to [facility] service, patient has tolerated 2 days intermittent trach collar, back on vent at night.  Has PEG.  Cardiology, Nephrology, Vascular surgery, and Pulm are following. S/p B/L AKA  Assessment & Plan   Sepsis secondary to UTI -Overnight on 3/8-11/2017, patient developed fever, leukocytosis, tachycardia, tachypnea -CXR reviewed and unremarkable for infection -UA: Few bacteria, 0-5 WBC, positive nitrates, trace leukocytes -Urine culture shows >100K GNR -Blood cultures pending -Will start patient on IV ceftriaxone -continue IVF and monitor   Acute blood loss anemia/Normocytic Anemia -Unclear etiology -Was transfused 1 unit PRBC earlier this hospitalization -recently had B/L AKA on 05/24/2017 -hemoglobin dropped from 9.4 to 7.7 today -will transfuse 1u PRBC and monitor CBC -patient currently on heparin  Acute respiratory failure -PCCM follow as patient has trach  collar  Presumed massive pulmonary embolism/Bilateral DVT -Status post IVC filter-placed due to doubts of patient's future compliance with oral anticoagulation -Continue heparin -will need to discuss with vascular surgery when coumadin or NOAC can be started- however currently with Acute anemia and receiving tranfusion  Acute kidney injury -History of recent obstructive uropathy. ?  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)- needs an outpatient work up -She did have a Foley catheter previous hospitalization -Creatinine currently appears to be improving, creatinine today 1.55 -Nephrology consulted and appreciated and is now signed off  Bacteremia -Cultures from 05/01/2017 showed diphtheroids, Acinetobacter lwoffii -Repeat blood cultures show no growth -Patient was placed on vancomycin and Zosyn however then transitioned to meropenem  Hypernatremia -Continue free water, sodium 140 -Continue IVF -Continue to monitor BMP  Acute metabolic encephalopathy -Avoid benzodiazepines -continue Haldol  NSTEMI -Cardiology consulted and appreciated, continue aspirin -Possibly related to PE  Presumed frostbite/Gangrene of feet -Vascular surgery consulted and appreciated -Wound care consulted -Patient may need bilateral lower extremity amputations -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  Discussed with sister on 05/22/2017 via phone.  Disposition Plan: admitted. Dispo pending PT/OT, continues to be on heparin, will need to either bridge to coumadin or start [facility] Vascular surgery  Procedures  Intubated/extubated on 05/01/2017 through 05/15/2017 IVC filter placement 05/10/2017 Trach placed on 05/15/2017 PEG placed 05/16/2017 Bilateral AKA 05/24/2017  Antibiotics   Anti-infectives (From admission, onward)    Start     Dose/Rate Route Frequency Ordered Stop   05/25/17 1300  cefTRIAXone (ROCEPHIN) 1  g in sodium chloride 0.9 % 100 mL IVPB     1 g 200 mL/hr over 30 Minutes Intravenous Every 24 hours 05/25/17 1200     05/24/17 1800  ceFAZolin (ANCEF) IVPB 2g/100 mL premix     2 g 200 mL/hr over 30 Minutes Intravenous Every 8 hours 05/24/17 1300 05/25/17 0238   05/07/17 2200  meropenem (MERREM) 500 mg in sodium chloride 0.9 % 100 mL IVPB     500 mg 200 mL/hr over 30 Minutes Intravenous Every 24 hours 05/07/17 1306 05/19/17 1911   05/07/17 0930  meropenem (MERREM) 1 g in sodium chloride 0.9 % 100 mL IVPB  Status:  Discontinued     1 g 200 mL/hr over 30 Minutes Intravenous Every 12 hours 05/07/17 0908 05/07/17 1306   05/06/17 1300  meropenem (MERREM) 500 mg in sodium chloride 0.9 % 100 mL IVPB  Status:  Discontinued     500 mg 200 mL/hr over 30 Minutes Intravenous Every 24 hours 05/06/17 1146 05/07/17 0908   05/04/17 2200  vancomycin (VANCOCIN) IVPB 1000 mg/200 mL premix     1,000 mg 200 mL/hr over 60 Minutes Intravenous Every 24 hours 05/04/17 0933 05/07/17 2339   05/04/17 1400  piperacillin-tazobactam (ZOSYN) IVPB 3.375 g  Status:  Discontinued     3.375 g 100 mL/hr over 30 Minutes Intravenous Every 6 hours 05/04/17 0932 05/06/17 1146   05/03/17 1300  vancomycin (VANCOCIN) IVPB 1000 mg/200 mL premix  Status:  Discontinued     1,000 mg 200 mL/hr over 60 Minutes Intravenous Every 48 hours 05/02/17 1340 05/04/17 0933   05/02/17 2000  piperacillin-tazobactam (ZOSYN) IVPB 2.25 g  Status:  Discontinued     2.25 g 100 mL/hr over 30 Minutes Intravenous Every 6 hours 05/02/17 1342 05/04/17 0932   05/02/17 1300  vancomycin (VANCOCIN) IVPB 1000 mg/200 mL premix  Status:  Discontinued     1,000 mg 200 mL/hr over 60 Minutes Intravenous Every 24 hours 05/01/17 1313 05/02/17 1340   05/01/17 2000  piperacillin-tazobactam (ZOSYN) IVPB 3.375 g  Status:  Discontinued     3.375 g 12.5 mL/hr over 240 Minutes  Intravenous Every 8 hours 05/01/17 1313 05/02/17 1342   05/01/17 1200  vancomycin (VANCOCIN) 1,750 mg in sodium chloride 0.9 % 500 mL IVPB     1,750 mg 250 mL/hr over 120 Minutes Intravenous  Once 05/01/17 1121 05/01/17 1409   05/01/17 1130  piperacillin-tazobactam (ZOSYN) IVPB 3.375 g     3.375 g 100 mL/hr over 30 Minutes Intravenous  Once 05/01/17 1119 05/01/17 1217   05/01/17 1130  vancomycin (VANCOCIN) IVPB 1000 mg/200 mL premix  Status:  Discontinued     1,000 mg 200 mL/hr over 60 Minutes Intravenous  Once 05/01/17 1119 05/01/17 1121      Subjective:   Joseph Downs seen and examined today. Not interactive today.   Objective:   Vitals:   05/26/17 0409 05/26/17 0738 05/26/17 0900 05/26/17 0920  BP: 110/64 133/72 122/67 (!) 147/80  Pulse: 90 93 89 (!) 101  Resp: (!) 25 20 19  (!) 34  Temp: 100.2 F (37.9 C) 99.6 F (37.6 C) 99 F (37.2 C) 97.7 F (36.5 C)  TempSrc: Oral Axillary Axillary Axillary  SpO2: 100% 100% 100% 99%  Weight: 73 kg (161 lb)     Height:        Intake/Output Summary (Last 24 hours) at 05/26/2017 1058 Last data filed at 05/26/2017 1018 Gross per 24 hour  Intake 4944.88 ml  Output 1650  ml  Net 3294.88 ml   Filed Weights   05/24/17 0420 05/25/17 0537 05/26/17 0409  Weight: 86.2 kg (190 lb) 72.6 kg (160 lb) 73 kg (161 lb)   Exam  General: Well developed, well nourished, NAD, appears stated age   HEENT: NCAT, mucous membranes moist.   Neck: Trach  Cardiovascular: S1 S2 auscultated, no murmur, tachycardic  Respiratory: Clear to auscultation bilaterally with equal chest rise  Abdomen: Soft, nontender, nondistended, + bowel sounds, +peg, abd binder  Extremities: B/L AKA with dressing in place  Neuro: cannot currently assess- not interactive this morning  Data Reviewed: I have personally reviewed following labs and imaging studies  CBC: Recent Labs  Lab 05/22/17 0305 05/23/17 0315 05/24/17 0312 05/25/17 0220 05/26/17 0338  WBC 13.8*  13.2* 12.9* 16.5* 19.0*  HGB 8.4* 9.1* 8.2* 9.4* 7.7*  HCT 27.3* 29.0* 26.3* 29.3* 24.0*  MCV 94.1 94.2 94.6 91.6 92.7  PLT 376 330 350 331 606   Basic Metabolic Panel: Recent Labs  Lab 05/21/17 0906 05/22/17 0305 05/23/17 0315 05/24/17 0312 05/25/17 0220 05/26/17 0338  NA 150* 147* 145 147* 147* 140  K 4.2 4.1 4.3 4.4 4.0 3.8  CL 123* 117* 116* 113* 113* 109  CO2 19* 20* 21* 23 23 21*  GLUCOSE 132* 135* 117* 99 139* 140*  BUN 57* 51* 45* 37* 28* 29*  CREATININE 2.22* 2.08* 1.89* 1.70* 1.54* 1.55*  CALCIUM 8.0* 8.0* 7.9* 8.0* 7.2* 7.0*  PHOS 3.1 2.6 3.3 3.8 3.2  --    GFR: Estimated Creatinine Clearance: 48.4 mL/min (A) (by C-G formula based on SCr of 1.55 mg/dL (H)). Liver Function Tests: Recent Labs  Lab 05/21/17 0906 05/22/17 0305 05/23/17 0315 05/24/17 0312 05/25/17 0220  ALBUMIN 1.6* 1.6* 1.7* 1.8* 2.0*   No results for input(s): LIPASE, AMYLASE in the last 168 hours. No results for input(s): AMMONIA in the last 168 hours. Coagulation Profile: No results for input(s): INR, PROTIME in the last 168 hours. Cardiac Enzymes: No results for input(s): CKTOTAL, CKMB, CKMBINDEX, TROPONINI in the last 168 hours. BNP (last 3 results) No results for input(s): PROBNP in the last 8760 hours. HbA1C: No results for input(s): HGBA1C in the last 72 hours. CBG: Recent Labs  Lab 05/25/17 1537 05/25/17 1942 05/25/17 2350 05/26/17 0408 05/26/17 0824  GLUCAP 137* 152* 151* 136* 162*   Lipid Profile: No results for input(s): CHOL, HDL, LDLCALC, TRIG, CHOLHDL, LDLDIRECT in the last 72 hours. Thyroid Function Tests: No results for input(s): TSH, T4TOTAL, FREET4, T3FREE, THYROIDAB in the last 72 hours. Anemia Panel: No results for input(s): VITAMINB12, FOLATE, FERRITIN, TIBC, IRON, RETICCTPCT in the last 72 hours. Urine analysis:    Component Value Date/Time   COLORURINE YELLOW 05/25/2017 0654   APPEARANCEUR CLEAR 05/25/2017 0654   LABSPEC 1.013 05/25/2017 0654    PHURINE 6.0 05/25/2017 0654   GLUCOSEU 50 (A) 05/25/2017 0654   HGBUR MODERATE (A) 05/25/2017 0654   BILIRUBINUR NEGATIVE 05/25/2017 0654   KETONESUR NEGATIVE 05/25/2017 0654   PROTEINUR NEGATIVE 05/25/2017 0654   NITRITE POSITIVE (A) 05/25/2017 0654   LEUKOCYTESUR TRACE (A) 05/25/2017 0654   Sepsis Labs: @LABRCNTIP (procalcitonin:4,lacticidven:4)  ) Recent Results (from the past 240 hour(s))  Urine Culture     Status: Abnormal (Preliminary result)   Collection Time: 05/25/17 10:10 AM  Result Value Ref Range Status   Specimen Description URINE, CLEAN CATCH  Final   Special Requests   Final    NONE Performed at [facility], [place]  [address], [place],  [address]    Culture >=100,000 COLONIES/mL GRAM NEGATIVE RODS (A)  Final   Report Status PENDING  Incomplete      Radiology Studies: Dg Chest Port 1 View  Result Date: 05/25/2017 CLINICAL DATA:  Sepsis EXAM: PORTABLE CHEST 1 VIEW COMPARISON:  May 17, 2017 FINDINGS: The tracheostomy tube is in good position. The heart, hila, and mediastinum are normal. Mild bibasilar atelectasis. The lungs are otherwise clear. No other acute abnormalities. IMPRESSION: Tracheostomy tube.  No cause for sepsis identified. Electronically Signed   By: Dorise Bullion III M.D   On: 05/25/2017 07:57     Scheduled Meds: . aspirin  81 mg Per Tube Daily  . chlorhexidine gluconate (MEDLINE KIT)  15 mL Mouth Rinse BID  . docusate sodium  100 mg Oral Daily  . free water  250 mL Per Tube Q8H  . haloperidol  1 mg Oral Daily  . haloperidol  2 mg Oral QHS  . insulin aspart  2-6 Units Subcutaneous Q4H  . ipratropium-albuterol  3 mL Nebulization BID  . mouth rinse  15 mL Mouth Rinse QID  . Melatonin  6 mg Oral QHS  . pantoprazole sodium  40 mg Per Tube QHS  . silver sulfADIAZINE   Topical Daily  . sodium chloride flush  10-40 mL Intracatheter Q12H  . thiamine  100 mg Per Tube Daily   Continuous Infusions: . cefTRIAXone (ROCEPHIN)  IV Stopped  (05/25/17 1351)  . dextrose 75 mL/hr at 05/26/17 1018  . feeding supplement (VITAL AF 1.2 CAL) 1,000 mL (05/26/17 1018)  . heparin 1,650 Units/hr (05/26/17 1018)  . lactated ringers Stopped (05/24/17 1900)  . magnesium sulfate 1 - 4 g bolus IVPB       LOS: 25 days   Time Spent in minutes   45 minutes  Anetta Olvera D.O. on 05/26/2017 at 10:58 AM  Between 7am to 7pm - Pager - [phone]  After 7pm go to [email or web address] - password [email or web address]  And look for the night coverage person covering for me after hours  [facility] Office  [phone]

## 2017-05-26 NOTE — Progress Notes (Signed)
Pt more awake this afternoon, pt nodding head yes and no appropriately. Sister at bedside.

## 2017-05-27 ENCOUNTER — Telehealth: Payer: Self-pay | Admitting: Vascular Surgery

## 2017-05-27 DIAGNOSIS — R0902 Hypoxemia: Secondary | ICD-10-CM

## 2017-05-27 DIAGNOSIS — Z930 Tracheostomy status: Secondary | ICD-10-CM

## 2017-05-27 DIAGNOSIS — J9602 Acute respiratory failure with hypercapnia: Secondary | ICD-10-CM

## 2017-05-27 LAB — HEMOGLOBIN AND HEMATOCRIT, BLOOD
HCT: 28.3 % — ABNORMAL LOW (ref 39.0–52.0)
HEMATOCRIT: 21.5 % — AB (ref 39.0–52.0)
Hemoglobin: 7.2 g/dL — ABNORMAL LOW (ref 13.0–17.0)
Hemoglobin: 9.6 g/dL — ABNORMAL LOW (ref 13.0–17.0)

## 2017-05-27 LAB — GLUCOSE, CAPILLARY
GLUCOSE-CAPILLARY: 126 mg/dL — AB (ref 65–99)
Glucose-Capillary: 152 mg/dL — ABNORMAL HIGH (ref 65–99)
Glucose-Capillary: 146 mg/dL — ABNORMAL HIGH (ref 65–99)
Glucose-Capillary: 139 mg/dL — ABNORMAL HIGH (ref 65–99)
Glucose-Capillary: 128 mg/dL — ABNORMAL HIGH (ref 65–99)
Glucose-Capillary: 134 mg/dL — ABNORMAL HIGH (ref 65–99)

## 2017-05-27 LAB — BASIC METABOLIC PANEL
Anion gap: 10 (ref 5–15)
BUN: 31 mg/dL — ABNORMAL HIGH (ref 6–20)
CALCIUM: 6.9 mg/dL — AB (ref 8.9–10.3)
CHLORIDE: 104 mmol/L (ref 101–111)
CO2: 22 mmol/L (ref 22–32)
CREATININE: 1.38 mg/dL — AB (ref 0.61–1.24)
GFR calc Af Amer: 60 mL/min — ABNORMAL LOW (ref >60)
GFR calc non Af Amer: 52 mL/min — ABNORMAL LOW (ref >60)
GLUCOSE: 159 mg/dL — AB (ref 65–99)
Potassium: 4 mmol/L (ref 3.5–5.1)
Sodium: 136 mmol/L (ref 135–145)

## 2017-05-27 LAB — CBC
HCT: 23.1 % — ABNORMAL LOW (ref 39.0–52.0)
Hemoglobin: 7.6 g/dL — ABNORMAL LOW (ref 13.0–17.0)
MCH: 29.6 pg (ref 26.0–34.0)
MCHC: 32.9 g/dL (ref 30.0–36.0)
MCV: 89.9 fL (ref 78.0–100.0)
Platelets: 279 10*3/uL (ref 150–400)
RBC: 2.57 MIL/uL — ABNORMAL LOW (ref 4.22–5.81)
RDW: 15.7 % — AB (ref 11.5–15.5)
WBC: 22.1 10*3/uL — ABNORMAL HIGH (ref 4.0–10.5)

## 2017-05-27 LAB — RENAL FUNCTION PANEL
ALBUMIN: 1.5 g/dL — AB (ref 3.5–5.0)
Anion gap: 10 (ref 5–15)
BUN: 31 mg/dL — AB (ref 6–20)
CHLORIDE: 105 mmol/L (ref 101–111)
CO2: 22 mmol/L (ref 22–32)
Calcium: 7 mg/dL — ABNORMAL LOW (ref 8.9–10.3)
Creatinine, Ser: 1.34 mg/dL — ABNORMAL HIGH (ref 0.61–1.24)
GFR calc Af Amer: >60 mL/min (ref >60)
GFR calc non Af Amer: 54 mL/min — ABNORMAL LOW (ref >60)
GLUCOSE: 160 mg/dL — AB (ref 65–99)
POTASSIUM: 4 mmol/L (ref 3.5–5.1)
Phosphorus: 3 mg/dL (ref 2.5–4.6)
Sodium: 137 mmol/L (ref 135–145)

## 2017-05-27 LAB — PREPARE RBC (CROSSMATCH)

## 2017-05-27 LAB — HEPARIN LEVEL (UNFRACTIONATED): Heparin Unfractionated: 0.11 IU/mL — ABNORMAL LOW (ref 0.30–0.70)

## 2017-05-27 MED ORDER — GUAIFENESIN-DM 100-10 MG/5ML PO SYRP: 5.0000 mL | ORAL_SOLUTION | ORAL | Status: DC | PRN

## 2017-05-27 MED ORDER — FUROSEMIDE 10 MG/ML IJ SOLN
20.0000 mg | Freq: Once | INTRAMUSCULAR | Status: AC
  Administered 2017-05-27: 20 mg via INTRAVENOUS
  Filled 2017-05-27: qty 2

## 2017-05-27 MED ORDER — PIPERACILLIN-TAZOBACTAM 3.375 G IVPB 30 MIN
3.3750 g | Freq: Once | INTRAVENOUS | Status: AC
  Administered 2017-05-27: 3.375 g via INTRAVENOUS
  Filled 2017-05-27: qty 50

## 2017-05-27 MED ORDER — PIPERACILLIN-TAZOBACTAM 3.375 G IVPB
3.3750 g | Freq: Three times a day (TID) | INTRAVENOUS | Status: AC
  Administered 2017-05-27 – 2017-06-03 (×20): 3.375 g via INTRAVENOUS
  Filled 2017-05-27 (×21): qty 50

## 2017-05-27 MED ORDER — RESOURCE THICKENUP CLEAR PO POWD
ORAL | Status: DC | PRN
  Filled 2017-05-27: qty 125

## 2017-05-27 NOTE — Telephone Encounter (Signed)
Sched staple removal 06/12/17 at 4:00. Spoke to pt's sister to inform them of appt.

## 2017-05-27 NOTE — Progress Notes (Signed)
Clinical Social Worker following patient/family for support and disposition plan. CSW met patient and patients sister Hoyle Sauer) at bedside. Patient was very sleepy and did not participate in conversation with CSW. Hoyle Sauer had questions about long term Medicaid for patient. CSW had Antoinette from [employer] [facility] explain to [place] that because patient has insurance that she will need to apply for long term medicaid  at [place] for patient. CSW explained to Hoyle Sauer that this process will take months but will need to be started as soon as possible in case patient will need long term stay at [facility]. CSW also explained to [place] that once patient is medically stable he will need short term rehab at a SNF. CSW also had a discussion with Hoyle Sauer about patients current insurance being a problem with him possible discharging to rehab due to patient only having Medicare part A. Hoyle Sauer stated she will start the long term medicaid application at [place].    Rhea Pink, MSW,  [place]

## 2017-05-27 NOTE — Plan of Care (Signed)
Continue current care plan 

## 2017-05-27 NOTE — Progress Notes (Signed)
Pharmacy Antibiotic Note  Joseph Downs is a 67 y.o. male admitted on 05/01/2017 with hypothermia and lactic acidosis.  Pt has been on Rocephin for UTI, Cx now returns w/ Pseudomonas >> to change to Zosyn.  Plan: Zosyn 3.375g IV q8h (4 hour infusion).  Height: 5\' 10"  (177.8 cm) Weight: 165 lb (74.8 kg) IBW/kg (Calculated) : 73  Temp (24hrs), Avg:99 F (37.2 C), Min:97.7 F (36.5 C), Max:99.6 F (37.6 C)  Recent Labs  Lab 05/23/17 0315 05/24/17 0312 05/25/17 0220 05/26/17 0338 05/26/17 0950 05/27/17 0202  WBC 13.2* 12.9* 16.5* 19.0*  --  22.1*  CREATININE 1.89* 1.70* 1.54* 1.55* 1.37* 1.38*  1.34*    Estimated Creatinine Clearance: 56 mL/min (A) (by C-G formula based on SCr of 1.34 mg/dL (H)).    No Known Allergies  Antimicrobials this admission: Vanc 2/13 >> 2/19 Zosyn 2/13 >> 2/18; 3/11 >>  Merrem 2/18 >> 3/3 Rocephin 3/9 >> 3/11  Microbiology results: 2/13: Blood: 1/2 actinetobacter lwoffii/diptheroids  2/13 Urine:> 100,000 staph lugdenensis (R-oxa) 2/19 BCx: ngtd 2/20 TA: neg 2/20 BCx: ngtd 2/22 UCx neg  2/23 UCx: negative  3/9 UCx: P.aeruginosa  Thank you for allowing pharmacy to be a part of this patient's care.  Joseph Downs, PharmD, BCPS  05/27/2017 7:18 AM

## 2017-05-27 NOTE — Progress Notes (Signed)
ANTICOAGULATION CONSULT NOTE - Follow Up Consult  Pharmacy Consult for heparin Indication: DVT +/- PE  Labs: Recent Labs    05/25/17 0220 05/25/17 1018 05/26/17 0338 05/26/17 0950 05/26/17 1327 05/27/17 0202  HGB 9.4*  --  7.7*  --   --  7.6*  HCT 29.3*  --  24.0*  --   --  23.1*  PLT 331  --  251  --   --  279  HEPARINUNFRC 0.53 0.70  --   --  0.33 0.11*  CREATININE 1.54*  --  1.55* 1.37*  --  1.38*  1.34*    Assessment: 67yo male subtherapeutic on heparin after three levels at goal though yesterday level had dropped to lower end of goal; Hgb low, RN notes no overt signs of bleeding.  Goal of Therapy:  Heparin level 0.3-0.7 units/ml   Plan:  Will increase heparin gtt cautiously by 2 units/kg/hr to 1800 units/hr and check level in 8 hours.    Vernard GamblesVeronda Edmonia Gonser, PharmD, BCPS  05/27/2017,4:01 AM

## 2017-05-27 NOTE — Progress Notes (Signed)
Orthopedic Tech Progress Note Patient Details:  Joseph Downs 11-25-1950 [id number]  Patient ID: Joseph FloridaLarry E Duchesneau, male   DOB: 11-25-1950, 67 y.o.   MRN: [id number]   Nikki DomCrawford, Bryant Lipps 05/27/2017, 9:26 AM Called in bio-tech brace order; spoke with Wylene MenLacey

## 2017-05-27 NOTE — Telephone Encounter (Signed)
-----   Message from Sharee PimpleMarilyn K McChesney, RN sent at 05/24/2017  1:31 PM EST ----- Regarding: 4 weeks staple removal, may be duplicate   ----- Message ----- From: Fransisco Hertzhen, Brian L, MD Sent: 05/24/2017  11:40 AM To: [facility] [address]Pool  Melonie FloridaLarry E Wahler [id number] June 30, 1950  PROCEDURE: bilateral above-the-knee amputation  Asst: Emilie RutterMatthew Eveland, [facility]   Follow-up: 4 weeks for staple removal

## 2017-05-27 NOTE — Progress Notes (Signed)
   VASCULAR SURGERY ASSESSMENT & PLAN:   3 Days Post-Op s/p: Bilat AKA's   Both AKA's look fine.   Low grade temp and WBC = 22K - AKA's do not appear to be a source of infection.   Updated sister.    SUBJECTIVE:   Pain under good control  PHYSICAL EXAM:   Vitals:   05/27/17 0320 05/27/17 0327 05/27/17 0726 05/27/17 0729  BP: 130/78 119/67 (!) 145/68 (!) 145/68  Pulse: 85 89 (!) 108 99  Resp: 16 (!) '23 16 18  '$ Temp:  99.3 F (37.4 C) 99.5 F (37.5 C)   TempSrc:  Oral Oral   SpO2: 100% 100% 100% 99%  Weight:  165 lb (74.8 kg)    Height:       AKA's inspected and look good. No erythema or drainage.   LABS:   Lab Results  Component Value Date   WBC 22.1 (H) 05/27/2017   HGB 7.6 (L) 05/27/2017   HCT 23.1 (L) 05/27/2017   MCV 89.9 05/27/2017   PLT 279 05/27/2017   Lab Results  Component Value Date   CREATININE 1.34 (H) 05/27/2017   CREATININE 1.38 (H) 05/27/2017   Lab Results  Component Value Date   INR 1.21 05/10/2017   CBG (last 3)  Recent Labs    05/26/17 2307 05/27/17 0328 05/27/17 0756  GLUCAP 143* 152* 146*    PROBLEM LIST:    Active Problems:   Lactic acidemia   Acute respiratory failure (HCC)   Exposure to weather condition   Hypotension   Non-ST elevation (NSTEMI) myocardial infarction (HCC)   Malnutrition of moderate degree   Acute cystitis with hematuria   Sepsis ([place])   Acute deep vein thrombosis (DVT) of proximal vein of both lower extremities (HCC)   Pressure injury of skin   Cardiogenic shock (HCC)   Acute cor pulmonale (HCC)   Acute respiratory failure with hypoxemia (HCC)   Deep vein thrombosis (DVT) of non-extremity vein   SIRS (systemic inflammatory response syndrome) (HCC)   S/P AKA (above knee amputation) bilateral (HCC)   Hypernatremia   Acute blood loss anemia   Encephalopathy   Postoperative pain   Pulmonary embolus (HCC)   CURRENT MEDS:   . aspirin  81 mg Per Tube Daily  . chlorhexidine gluconate (MEDLINE KIT)   15 mL Mouth Rinse BID  . docusate sodium  100 mg Oral Daily  . free water  250 mL Per Tube Q8H  . haloperidol  1 mg Oral Daily  . haloperidol  2 mg Oral QHS  . insulin aspart  2-6 Units Subcutaneous Q4H  . ipratropium-albuterol  3 mL Nebulization BID  . mouth rinse  15 mL Mouth Rinse QID  . Melatonin  6 mg Oral QHS  . pantoprazole sodium  40 mg Per Tube QHS  . silver sulfADIAZINE   Topical Daily  . sodium chloride flush  10-40 mL Intracatheter Q12H  . thiamine  100 mg Per Tube Daily    Deitra Mayo Beeper: [phone] Office: [phone] 05/27/2017

## 2017-05-27 NOTE — Progress Notes (Signed)
Physical Therapy Treatment Patient Details Name: Joseph Downs MRN: [id number] DOB: 1950/07/13 Today's Date: 05/27/2017    History of Present Illness This is a 67 year old man who was found down, unresponsive and hypothermic; Pt developed respiratory failure and intubated 2/13; Encephalopathy, NSTEMI, Acute Renal Failure; Bil DVTs, massive PE; shock, Bacteremia; IVC filter placed 2/22; Extubated 2/26, re-intubated later that day; received trach 2/27; received PEG 2/28; Pt currently on trach collar. Pt underwent bil AKA on 05/24/17.     PT Comments    Pt reassessed after undergoing Bil AKA on 05/24/17 and goals readjusted. Feel pt may benefit from CIR if he begins to progress with mobility.  Follow Up Recommendations  CIR     Equipment Recommendations  Other (comment)(To be assessed)    Recommendations for Other Services       Precautions / Restrictions Precautions Precautions: Fall    Mobility  Bed Mobility Overal bed mobility: Needs Assistance Bed Mobility: Supine to Sit;Sit to Supine     Supine to sit: Max assist;HOB elevated Sit to supine: Max assist;HOB elevated   General bed mobility comments: Assist to come into long sitting and then return to supine  Transfers                 General transfer comment: Not attempted. Pt initially getting ready for FEES and then intiating second unit of blood.  Ambulation/Gait                 Stairs            Wheelchair Mobility    Modified Rankin (Stroke Patients Only)       Balance                                            Cognition Arousal/Alertness: Awake/alert Behavior During Therapy: WFL for tasks assessed/performed Overall Cognitive Status: Difficult to assess                                        Exercises Amputee Exercises Straight Leg Raises: AAROM;Both;5 reps;Supine    General Comments        Pertinent Vitals/Pain Pain Assessment:  Faces Faces Pain Scale: Hurts little more Pain Location: bil LEs when moved Pain Descriptors / Indicators: Grimacing Pain Intervention(s): Limited activity within patient's tolerance;Monitored during session;Repositioned    Home Living                      Prior Function            PT Goals (current goals can now be found in the care plan section) Acute Rehab PT Goals PT Goal Formulation: With patient Time For Goal Achievement: 06/10/17 Potential to Achieve Goals: Fair Progress towards PT goals: Goals downgraded-see care plan    Frequency    Min 3X/week      PT Plan Discharge plan needs to be updated;Frequency needs to be updated    Co-evaluation              AM-PAC PT "6 Clicks" Daily Activity  Outcome Measure  Difficulty turning over in bed (including adjusting bedclothes, sheets and blankets)?: Unable Difficulty moving from lying on back to sitting on the side of the bed? : Unable Difficulty sitting down on and standing  up from a chair with arms (e.g., wheelchair, bedside commode, etc,.)?: Unable Help needed moving to and from a bed to chair (including a wheelchair)?: Total Help needed walking in hospital room?: Total Help needed climbing 3-5 steps with a railing? : Total 6 Click Score: 6    End of Session Equipment Utilized During Treatment: Oxygen(trach collar) Activity Tolerance: Patient tolerated treatment well Patient left: in bed;with call bell/phone within reach;with nursing/sitter in room Nurse Communication: Mobility status;Need for lift equipment PT Visit Diagnosis: Other abnormalities of gait and mobility (R26.89);Muscle weakness (generalized) (M62.81)     Time: [id number] PT Time Calculation (min) (ACUTE ONLY): 10 min  Charges:                       G CodesSkip Mayer:       Nacole Fluhr PT [phone]    Angelina OkCary W N[facility] Hospit[facility] CenterMaycok 05/27/2017, 3:50 PM

## 2017-05-27 NOTE — Progress Notes (Signed)
  Speech Language Pathology   Patient Details Name: Melonie FloridaLarry E Dearinger MRN: [id number] DOB: 1950/11/09 Today's Date: 05/27/2017 Time:  -     FEES planned for this afternoon at 2:00 (if alert enough s/p morphine for pain)             GO                [name], Mackena Plummer Willis 05/27/2017, 8:54 AM   Breck CoonsLisa Willis Lonell FaceLitaker M.Ed [employer]-SLP Pager [phone]

## 2017-05-27 NOTE — Progress Notes (Signed)
PULMONARY MEDICINE follow up   Name: KEELIN SHERIDAN MRN: [id number] DOB: March 11, 1951    ADMISSION DATE:  05/01/2017 CONSULTATION DATE:  05/01/17 REFERRING MD:  Dr. Deretha Emory CHIEF COMPLAINT:  Found down  HISTORY OF PRESENT ILLNESS:   67 year old homeless man found down/13 with hypothermia, shock lactic acidosis. Found to have bilateral DVTs and evidence of RV strain on echo He had Acinetobacter in blood and staph in urine with elevated PSA. Unable to be weaned from vent, trach placed 2/27, plans to place PEG 2/28  STUDIES:  ECHO 2/14 - EF 65-70%, G1DD, RV strain Echo 2/21 - EF 55-60%, RV improved  CULTURES: MRSA PCR negative Ucx + staph lugdenensis Blood cx 2/13 >> Diphtheroids + Acinetobacter lwoffii Blood cx 2/19 >>no growth x 5 days Blood cx 2/20 >> no growth x 5 days  ANTIBIOTICS: Zosyn 2/13> 2/18 Vanc 2/13 >>2/19 Meropenem 2/18 >>  SIGNIFICANT EVENTS: 2/13 admitted, intubated 2/22 IVC filter placed 2/25 1u PRBC 2/27 Trach  LINES/TUBES: ETT 2/13 >> 2/27 Trach 2/27>>> Foley 2/13>>> PEG 2/28>>> Right IJ HD catheter 2/15 >>out  Left IJ CVC 2/15 >>out  SUBJECTIVE:  No events overnight, cuff of the trach found and left up by RT  Today's Vitals   05/27/17 0320 05/27/17 0327 05/27/17 0726 05/27/17 0729  BP: 130/78 119/67 (!) 145/68 (!) 145/68  Pulse: 85 89 (!) 108 99  Resp: 16 (!) 23 16 18   Temp:  99.3 F (37.4 C) 99.5 F (37.5 C)   TempSrc:  Oral Oral   SpO2: 100% 100% 100% 99%  Weight:  74.8 kg (165 lb)    Height:      PainSc:          Intake/Output Summary (Last 24 hours) at 05/27/2017 4098 Last data filed at 05/27/2017 0600 Gross per 24 hour  Intake 4656.94 ml  Output 1400 ml  Net 3256.94 ml    PHYSICAL EXAMINATION: General: Chronically ill appearing male, on TC, NAD  HEENT: He has a #6 cuffed tracheostomy, normocephalic Pulmonary: Coarse BS diffusely Cardiac: RRR, Nl S1/S2 and -M/R/G. Abdomen: Soft , non-tender,  tolerating tube feeds via PEG  tube. Neuro/psych: Awake, tries to communicate Extremities/musculoskeletal: bilateral AKA- bandaged  LABS:  BMP Latest Ref Rng & Units 05/27/2017 05/27/2017 05/26/2017  Glucose 65 - 99 mg/dL 119(J) 478(G) 956(O)  BUN 6 - 20 mg/dL 13(Y) 86(V) 78(I)  Creatinine 0.61 - 1.24 mg/dL 6.96(E) 9.52(W) 4.13(K)  Sodium 135 - 145 mmol/L 136 137 137  Potassium 3.5 - 5.1 mmol/L 4.0 4.0 3.6  Chloride 101 - 111 mmol/L 104 105 106  CO2 22 - 32 mmol/L 22 22 22   Calcium 8.9 - 10.3 mg/dL 6.9(L) 7.0(L) 6.7(L)     05/17/17 cxr I reviewed CXR myself, trach is in a good position  ASSESSMENT / PLAN:   Acute respiratory failure on ventilator- trach 2/27 Likely pulmonary embolism with associated right heart strain Toxic metabolic encephalopathy Agitation Stress related non-ST elevation MI, suspect right heart strain Acute Cor pulmonale Acute non-oliguric renal failure; creatinine is improved some Rhabdomyolysis Fluid and electrolyte imbalance: Hypernatremia, hypokalemia, metabolic acidosis  Acinetobacter bacteremia. 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  Alyson ReedyWesam G. Clotine Heiner, M.D. [facility]. Pager: [phone]. After hours pager: [phone].

## 2017-05-27 NOTE — Care Management Note (Signed)
Case Management Note  Patient Details  Name: Joseph Downs MRN: [id number] Date of Birth: [birth year]  Subjective/Objective:    Pt admitted with Hypothermia shock, and bilateral DVTs                Action/Plan:  PTA from home and was found down.  Pt remains on ventilator via ETT tube.  Pt originally on CRRT - now discontinued and daily IHD assessments.  CM will continue to follow for discharge needs   Expected Discharge Date:  05/27/17               Expected Discharge Plan:     In-House Referral:     Discharge planning Services  CM Consult  Post Acute Care Choice:    Choice offered to:     DME Arranged:    DME Agency:     HH Arranged:    HH Agency:     Status of Service:     If discussed at [employer] Meetings, dates discussed:    Additional Comments: 05/27/2017  Pt remains on TC 28%.  Having fevers and increased WBC count.  Pt remains on IV heparin, tube feeds and IV antibiotics.  CSW continuing to follow for placement when appropriate  05/24/17 Pt is now s/p bilateral BKA.  CSW continuing to follow for discharge to SNF when appropriate [name], Keilyn Nadal S, RN 05/27/2017, 9:13 AM

## 2017-05-27 NOTE — Progress Notes (Signed)
PROGRESS NOTE    Joseph Downs  WCB:[id number] DOB: [birth year] DOA: 05/01/2017 PCP: Patient, No Pcp Per   Brief Narrative:  Joseph Downs is a 67 yo M, reportedly homeless, no past medical history that I can find from his recent admission to our service other than obstructive uropathy requiring foley.  Evidently, on 2/13, he was found down in the cold.  Brought to the ER, hypotensive, had Lactate >7 despite 30 cc/kg IVF and re-warming, was admitted to ICU, at that time was mentating enough to explain that he had passed out, not clear why.  That night, developed respiratory distress requiring intubation and hypotension. Following that, was treated with broad spectrum antibiotics for hypotension, tachycardia and leukocytosis. Renal failure, creatinine up to 7. Started on CVVHD. Blood cultures grew Acineto lwoffii, Urine cx frew staph lugdunensis.  Echo showed severe RV strain and bilateral LE dopplers showed extensive DVT.  CTA not possible, nor catheterization/lysis due to AKI.  Started on heparin.  At time of transfer out of ICU to [facility] service, patient has tolerated 2 days intermittent trach collar, back on vent at night.  Has PEG.  Cardiology, Nephrology, Vascular surgery, and Pulm are following. S/p B/L AKA  Patient developed sepsis secondary to UTI. Placed on zosyn. Anemia secondary to blood loss from surgery, will continue to transfuse.  Assessment & Plan   Sepsis secondary to UTI -Overnight on 3/8-11/2017, patient developed fever, leukocytosis, tachycardia, tachypnea -CXR reviewed and unremarkable for infection -UA: Few bacteria, 0-5 WBC, positive nitrates, trace leukocytes -Urine culture shows >100K GNR- pseudomonas aeruginosa -Blood cultures from 05/25/2017 show no growth to date -Will place on zosyn -continue IVF and monitor   Acute blood loss anemia/Normocytic Anemia -Unclear etiology -recently had B/L AKA on 05/24/2017 -hemoglobin dropped from 9.4 to 7.7 on 05/26/2017, howevwer  hemoglobin 7.6 this morning -will transfuse 2u PRBC and continue to monitor CBC -will hold heparin  Acute respiratory failure -PCCM follow as patient has trach collar -FEES planned for today with speech therapy -will add on guaifenesin to aid with secretions  Presumed massive pulmonary embolism/Bilateral DVT -Status post IVC filter-placed due to doubts of patient's future compliance with oral anticoagulation  -will need to discuss with vascular surgery when coumadin or NOAC can be started- however currently with Acute anemia and receiving transfusion and holding heparin  Acute kidney injury -History of recent obstructive uropathy. ?  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)- needs an outpatient work up -She did have a Foley catheter previous hospitalization -Creatinine currently appears to be improving, creatinine today 1.55 -Nephrology consulted and appreciated and is now signed off  Bacteremia -Cultures from 05/01/2017 showed diphtheroids, Acinetobacter lwoffii -Repeat blood cultures show no growth -Patient was placed on vancomycin and Zosyn however then transitioned to meropenem  Hypernatremia -Continue free water, sodium 137 -Continue IVF -Continue to monitor BMP  Acute metabolic encephalopathy -Avoid benzodiazepines -continue Haldol  NSTEMI -Cardiology consulted and appreciated, continue aspirin -Possibly related to PE  Presumed frostbite/Gangrene of feet -Vascular surgery consulted and appreciated -Wound care consulted -Patient may need bilateral lower extremity amputations -s/p bilateral AKA 05/24/2017 -PT and OT consulted  Hypocalcemia -Calcium 7, however albumin 1.5.  Corrected calcium 9  DVT Prophylaxis heparin  Code Status: Full  Family Communication: None at bedside.  Discussed with sister on 05/22/2017 via phone.  Disposition Plan: admitted. Dispo TBD-suspect SN. Continues to be anemic. FEES planned for  today.   Consultants PCCM Nephrology Cardiology Vascular surgery  Procedures  Intubated/extubated on 05/01/2017 through 05/15/2017 IVC filter placement 05/10/2017 Trach placed on 05/15/2017 PEG placed 05/16/2017 Bilateral AKA 05/24/2017  Antibiotics   Anti-infectives (From admission, onward)   Start     Dose/Rate Route Frequency Ordered Stop   05/27/17 1400  piperacillin-tazobactam (ZOSYN) IVPB 3.375 g     3.375 g 12.5 mL/hr over 240 Minutes Intravenous Every 8 hours 05/27/17 0724     05/27/17 0730  piperacillin-tazobactam (ZOSYN) IVPB 3.375 g     3.375 g 100 mL/hr over 30 Minutes Intravenous  Once 05/27/17 0724 05/27/17 0908   05/25/17 1300  cefTRIAXone (ROCEPHIN) 1 g in sodium chloride 0.9 % 100 mL IVPB  Status:  Discontinued     1 g 200 mL/hr over 30 Minutes Intravenous Every 24 hours 05/25/17 1200 05/27/17 0706   05/24/17 1800  ceFAZolin (ANCEF) IVPB 2g/100 mL premix     2 g 200 mL/hr over 30 Minutes Intravenous Every 8 hours 05/24/17 1300 05/25/17 0238   05/07/17 2200  meropenem (MERREM) 500 mg in sodium chloride 0.9 % 100 mL IVPB     500 mg 200 mL/hr over 30 Minutes Intravenous Every 24 hours 05/07/17 1306 05/19/17 1911   05/07/17 0930  meropenem (MERREM) 1 g in sodium chloride 0.9 % 100 mL IVPB  Status:  Discontinued     1 g 200 mL/hr over 30 Minutes Intravenous Every 12 hours 05/07/17 0908 05/07/17 1306   05/06/17 1300  meropenem (MERREM) 500 mg in sodium chloride 0.9 % 100 mL IVPB  Status:  Discontinued     500 mg 200 mL/hr over 30 Minutes Intravenous Every 24 hours 05/06/17 1146 05/07/17 0908   05/04/17 2200  vancomycin (VANCOCIN) IVPB 1000 mg/200 mL premix     1,000 mg 200 mL/hr over 60 Minutes Intravenous Every 24 hours 05/04/17 0933 05/07/17 2339   05/04/17 1400  piperacillin-tazobactam (ZOSYN) IVPB 3.375 g  Status:  Discontinued     3.375 g 100 mL/hr over 30 Minutes Intravenous Every 6 hours 05/04/17 0932 05/06/17 1146   05/03/17 1300  vancomycin (VANCOCIN) IVPB  1000 mg/200 mL premix  Status:  Discontinued     1,000 mg 200 mL/hr over 60 Minutes Intravenous Every 48 hours 05/02/17 1340 05/04/17 0933   05/02/17 2000  piperacillin-tazobactam (ZOSYN) IVPB 2.25 g  Status:  Discontinued     2.25 g 100 mL/hr over 30 Minutes Intravenous Every 6 hours 05/02/17 1342 05/04/17 0932   05/02/17 1300  vancomycin (VANCOCIN) IVPB 1000 mg/200 mL premix  Status:  Discontinued     1,000 mg 200 mL/hr over 60 Minutes Intravenous Every 24 hours 05/01/17 1313 05/02/17 1340   05/01/17 2000  piperacillin-tazobactam (ZOSYN) IVPB 3.375 g  Status:  Discontinued     3.375 g 12.5 mL/hr over 240 Minutes Intravenous Every 8 hours 05/01/17 1313 05/02/17 1342   05/01/17 1200  vancomycin (VANCOCIN) 1,750 mg in sodium chloride 0.9 % 500 mL IVPB     1,750 mg 250 mL/hr over 120 Minutes Intravenous  Once 05/01/17 1121 05/01/17 1409   05/01/17 1130  piperacillin-tazobactam (ZOSYN) IVPB 3.375 g     3.375 g 100 mL/hr over 30 Minutes Intravenous  Once 05/01/17 1119 05/01/17 1217   05/01/17 1130  vancomycin (VANCOCIN) IVPB 1000 mg/200 mL premix  Status:  Discontinued     1,000 mg 200 mL/hr over 60 Minutes Intravenous  Once 05/01/17 1119 05/01/17 1121      Subjective:   [name] seen and examined today. Not interactive today. Currently awake.  Objective:   Vitals:   05/27/17 0320 05/27/17 0327 05/27/17 0726 05/27/17 0729  BP: 130/78 119/67 (!) 145/68 (!) 145/68  Pulse: 85 89 (!) 108 99  Resp: 16 (!) 23 16 18   Temp:  99.3 F (37.4 C) 99.5 F (37.5 C)   TempSrc:  Oral Oral   SpO2: 100% 100% 100% 99%  Weight:  74.8 kg (165 lb)    Height:        Intake/Output Summary (Last 24 hours) at 05/27/2017 1021 Last data filed at 05/27/2017 0600 Gross per 24 hour  Intake 4212.94 ml  Output 1400 ml  Net 2812.94 ml   Filed Weights   05/25/17 0537 05/26/17 0409 05/27/17 0327  Weight: 72.6 kg (160 lb) 73 kg (161 lb) 74.8 kg (165 lb)   Exam  General: Well developed, well  nourished, mild distress (?pain)  HEENT: NCAT, mucous membranes moist.   Neck: Trach  Cardiovascular: S1 S2 auscultated, no murmur, tachycardic  Respiratory: Coarse breaths, sputum productive  Abdomen: Soft, nontender, nondistended, + bowel sounds, +peg, abdominal binder  Extremities:  B/L AKA with dressings in place  Neuro: awake and alert, however difficult to assess orientation.   Data Reviewed: I have personally reviewed following labs and imaging studies  CBC: Recent Labs  Lab 05/23/17 0315 05/24/17 0312 05/25/17 0220 05/26/17 0338 05/27/17 0202 05/27/17 0824  WBC 13.2* 12.9* 16.5* 19.0* 22.1*  --   HGB 9.1* 8.2* 9.4* 7.7* 7.6* 7.2*  HCT 29.0* 26.3* 29.3* 24.0* 23.1* 21.5*  MCV 94.2 94.6 91.6 92.7 89.9  --   PLT 330 350 331 251 279  --    Basic Metabolic Panel: Recent Labs  Lab 05/23/17 0315 05/24/17 0312 05/25/17 0220 05/26/17 0338 05/26/17 0950 05/27/17 0202  NA 145 147* 147* 140 137 136  137  K 4.3 4.4 4.0 3.8 3.6 4.0  4.0  CL 116* 113* 113* 109 106 104  105  CO2 21* 23 23 21* 22 22  22   GLUCOSE 117* 99 139* 140* 218* 159*  160*  BUN 45* 37* 28* 29* 28* 31*  31*  CREATININE 1.89* 1.70* 1.54* 1.55* 1.37* 1.38*  1.34*  CALCIUM 7.9* 8.0* 7.2* 7.0* 6.7* 6.9*  7.0*  PHOS 3.3 3.8 3.2  --  2.7 3.0   GFR: Estimated Creatinine Clearance: 56 mL/min (A) (by C-G formula based on SCr of 1.34 mg/dL (H)). Liver Function Tests: Recent Labs  Lab 05/23/17 0315 05/24/17 0312 05/25/17 0220 05/26/17 0950 05/27/17 0202  ALBUMIN 1.7* 1.8* 2.0* 1.6* 1.5*   No results for input(s): LIPASE, AMYLASE in the last 168 hours. No results for input(s): AMMONIA in the last 168 hours. Coagulation Profile: No results for input(s): INR, PROTIME in the last 168 hours. Cardiac Enzymes: No results for input(s): CKTOTAL, CKMB, CKMBINDEX, TROPONINI in the last 168 hours. BNP (last 3 results) No results for input(s): PROBNP in the last 8760 hours. HbA1C: No results for  input(s): HGBA1C in the last 72 hours. CBG: Recent Labs  Lab 05/26/17 1615 05/26/17 2020 05/26/17 2307 05/27/17 0328 05/27/17 0756  GLUCAP 128* 115* 143* 152* 146*   Lipid Profile: No results for input(s): CHOL, HDL, LDLCALC, TRIG, CHOLHDL, LDLDIRECT in the last 72 hours. Thyroid Function Tests: No results for input(s): TSH, T4TOTAL, FREET4, T3FREE, THYROIDAB in the last 72 hours. Anemia Panel: No results for input(s): VITAMINB12, FOLATE, FERRITIN, TIBC, IRON, RETICCTPCT in the last 72 hours. Urine analysis:    Component Value Date/Time   COLORURINE YELLOW 05/25/2017 0654  APPEARANCEUR CLEAR 05/25/2017 0654   LABSPEC 1.013 05/25/2017 0654   PHURINE 6.0 05/25/2017 0654   GLUCOSEU 50 (A) 05/25/2017 0654   HGBUR MODERATE (A) 05/25/2017 0654   BILIRUBINUR NEGATIVE 05/25/2017 0654   KETONESUR NEGATIVE 05/25/2017 0654   PROTEINUR NEGATIVE 05/25/2017 0654   NITRITE POSITIVE (A) 05/25/2017 0654   LEUKOCYTESUR TRACE (A) 05/25/2017 0654   Sepsis Labs: @LABRCNTIP (procalcitonin:4,lacticidven:4)  ) Recent Results (from the past 240 hour(s))  Urine Culture     Status: Abnormal (Preliminary result)   Collection Time: 05/25/17 10:10 AM  Result Value Ref Range Status   Specimen Description URINE, CLEAN CATCH  Final   Special Requests NONE  Final   Culture (A)  Final    >=100,000 COLONIES/mL PSEUDOMONAS AERUGINOSA REPEATING SENSITIVITIES Performed at [facility], [place] [address], [place], [place] [address]    Report Status PENDING  Incomplete  Culture, blood (routine x 2)     Status: None (Preliminary result)   Collection Time: 05/25/17 10:18 AM  Result Value Ref Range Status   Specimen Description BLOOD LEFT HAND  Final   Special Requests   Final    BOTTLES DRAWN AEROBIC AND ANAEROBIC Blood Culture adequate volume   Culture   Final    NO GROWTH 1 DAY Performed at [facility], [place] [address], [place], [place] [address]    Report Status PENDING  Incomplete    Culture, blood (routine x 2)     Status: None (Preliminary result)   Collection Time: 05/25/17 10:32 AM  Result Value Ref Range Status   Specimen Description BLOOD RIGHT HAND  Final   Special Requests   Final    BOTTLES DRAWN AEROBIC AND ANAEROBIC Blood Culture adequate volume   Culture   Final    NO GROWTH 1 DAY Performed at [facility], [place] [address], [place],  [address]    Report Status PENDING  Incomplete      Radiology Studies: No results found.   Scheduled Meds: . aspirin  81 mg Per Tube Daily  . chlorhexidine gluconate (MEDLINE KIT)  15 mL Mouth Rinse BID  . docusate sodium  100 mg Oral Daily  . free water  250 mL Per Tube Q8H  . haloperidol  1 mg Oral Daily  . haloperidol  2 mg Oral QHS  . insulin aspart  2-6 Units Subcutaneous Q4H  . ipratropium-albuterol  3 mL Nebulization BID  . mouth rinse  15 mL Mouth Rinse QID  . Melatonin  6 mg Oral QHS  . pantoprazole sodium  40 mg Per Tube QHS  . silver sulfADIAZINE   Topical Daily  . sodium chloride flush  10-40 mL Intracatheter Q12H  . thiamine  100 mg Per Tube Daily   Continuous Infusions: . dextrose 75 mL/hr at 05/26/17 1900  . feeding supplement (VITAL AF 1.2 CAL) 1,000 mL (05/26/17 1934)  . heparin 1,800 Units/hr (05/27/17 0405)  . lactated ringers Stopped (05/24/17 1900)  . magnesium sulfate 1 - 4 g bolus IVPB    . piperacillin-tazobactam (ZOSYN)  IV       LOS: 26 days   Time Spent in minutes   45 minutes  Sherill Wegener D.O. on 05/27/2017 at 10:21 AM  Between 7am to 7pm - Pager - [phone]  After 7pm go to [email or web address] - password [email or web address]  And look for the night coverage person covering for me after hours  [facility] Office  [phone]

## 2017-05-27 NOTE — Procedures (Signed)
Objective Swallowing Evaluation: Type of Study: FEES-Fiberoptic Endoscopic Evaluation of Swallow   Patient Details  Name: Joseph Downs MRN: [id number] Date of Birth: 03/27/1950  Today's Date: 05/27/2017 Time: SLP Start Time (ACUTE ONLY): 1443 -SLP Stop Time (ACUTE ONLY): 1440  SLP Time Calculation (min) (ACUTE ONLY): 1437 min   Past Medical History: History reviewed. No pertinent past medical history. Past Surgical History:  Past Surgical History:  Procedure Laterality Date  . AMPUTATION Bilateral 05/24/2017   Procedure: Bilateral AMPUTATION ABOVE KNEE;  Surgeon: Fransisco Hertzhen, Brian L, MD;  [facility];  Service: Vascular;  Laterality: Bilateral;  . ESOPHAGOGASTRODUODENOSCOPY N/A 05/16/2017   Procedure: ESOPHAGOGASTRODUODENOSCOPY (EGD);  Surgeon: Violeta Gelinashompson, Burke, MD;  [facility];  Service: Endoscopy;  Laterality: N/A;  . IR IVC FILTER PLMT / S&I Lenise Arena/IMG GUID/MOD SED  05/10/2017  . PEG PLACEMENT N/A 05/16/2017   Procedure: PERCUTANEOUS ENDOSCOPIC GASTROSTOMY (PEG) REPLACEMENT;  Surgeon: Violeta Gelinashompson, Burke, MD;  [facility];  Service: Endoscopy;  Laterality: N/A;   HPI: 67 year old homeless man found down/13 with hypothermia, shock lactic acidosis. 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 HehadAcinetobacter in blood and staph in urine with elevated PSA. Unable to be weaned from vent (ETT 2/13-2/27), trach placed 2/27, PEG 2/28.    No Data Recorded   Assessment / Plan / Recommendation  CHL IP CLINICAL IMPRESSIONS 05/27/2017  Clinical Impression No significant edema or erythema and FEES completed without PMV due to decreased functional tolerance.  Pt exhibited mild pharyngeal dysphagia with penetration of nectar thick above the vocal cords given decreased vestibule closure and protection. Mild pyriform sinus retention throughout FEES. No definate penetration with thin however increased risk given prolonged hospitalization and deconditioning.  Conservitavely recommend initiating honey thick and puree, small sips, crush meds and full supervision.     SLP Visit Diagnosis Dysphagia, oropharyngeal phase (R13.12)  Attention and concentration deficit following --  Frontal lobe and executive function deficit following --  Impact on safety and function Moderate aspiration risk      CHL IP TREATMENT RECOMMENDATION 05/27/2017  Treatment Recommendations Therapy as outlined in treatment plan below     Prognosis 05/27/2017  Prognosis for Safe Diet Advancement Good  Barriers to Reach Goals Cognitive deficits  Barriers/Prognosis Comment --    CHL IP DIET RECOMMENDATION 05/27/2017  SLP Diet Recommendations Dysphagia 1 (Puree) solids;Honey thick liquids  Liquid Administration via Cup  Medication Administration Crushed with puree  Compensations Slow rate;Small sips/bites;Minimize environmental distractions;Clear throat intermittently  Postural Changes Seated upright at 90 degrees      CHL IP OTHER RECOMMENDATIONS 05/27/2017  Recommended Consults --  Oral Care Recommendations Oral care BID  Other Recommendations Order thickener from pharmacy      CHL IP FOLLOW UP RECOMMENDATIONS 05/27/2017  Follow up Recommendations [facility]illed Nursing facility      [facility] D[facility]  Speech Therapy Frequency (ACUTE ONLY) min 2x/week  Treatment Duration 2 weeks           CHL IP ORAL PHASE 05/27/2017  Oral Phase Impaired  Oral - Pudding Teaspoon --  Oral - Pudding Cup --  Oral - Honey Teaspoon --  Oral - Honey Cup Delayed oral transit  Oral - Nectar Teaspoon --  Oral - Nectar Cup Delayed oral transit  Oral - Nectar Straw Delayed oral transit  Oral - Thin Teaspoon --  Oral - Thin Cup Delayed oral transit  Oral - Thin Straw --  Oral - Puree Delayed oral transit  Oral - Mech Soft --  Oral - Regular --  Oral - Multi-Consistency --  Oral - Pill --  Oral Phase - Comment --    CHL IP PHARYNGEAL PHASE 05/27/2017  Pharyngeal  Phase Impaired  Pharyngeal- Pudding Teaspoon --  Pharyngeal --  Pharyngeal- Pudding Cup --  Pharyngeal --  Pharyngeal- Honey Teaspoon --  Pharyngeal --  Pharyngeal- Honey Cup Pharyngeal residue - pyriform;Reduced laryngeal elevation  Pharyngeal --  Pharyngeal- Nectar Teaspoon --  Pharyngeal --  Pharyngeal- Nectar Cup Pharyngeal residue - pyriform  Pharyngeal --  Pharyngeal- Nectar Straw Penetration/Aspiration during swallow;Pharyngeal residue - pyriform  Pharyngeal Material enters airway, remains ABOVE vocal cords and not ejected out  Pharyngeal- Thin Teaspoon --  Pharyngeal --  Pharyngeal- Thin Cup Pharyngeal residue - pyriform  Pharyngeal --  Pharyngeal- Thin Straw NT  Pharyngeal --  Pharyngeal- Puree WFL  Pharyngeal --  Pharyngeal- Mechanical Soft --  Pharyngeal --  Pharyngeal- Regular --  Pharyngeal --  Pharyngeal- Multi-consistency --  Pharyngeal --  Pharyngeal- Pill --  Pharyngeal --  Pharyngeal Comment --     CHL IP CERVICAL ESOPHAGEAL PHASE 05/27/2017  Cervical Esophageal Phase WFL  Pudding Teaspoon --  Pudding Cup --  Honey Teaspoon --  Honey Cup --  Nectar Teaspoon --  Nectar Cup --  Nectar Straw --  Thin Teaspoon --  Thin Cup --  Thin Straw --  Puree --  Mechanical Soft --  Regular --  Multi-consistency --  Pill --  Cervical Esophageal Comment --    No flowsheet data found.  Royce Macadamia 05/27/2017, 3:27 PM  Breck Coons [name][employer] [phone]

## 2017-05-27 NOTE — Progress Notes (Signed)
OT Cancellation Note  Patient Details Name: Joseph Downs MRN: [id number] DOB: 1950-05-24   Cancelled Treatment:    Reason Eval/Treat Not Completed: Patient at procedure or test/ unavailable.   [facility]  Chrys Landgrebe, OT/L  [phone] 05/27/2017 05/27/2017, 2:10 PM

## 2017-05-28 DIAGNOSIS — Z930 Tracheostomy status: Secondary | ICD-10-CM

## 2017-05-28 LAB — TYPE AND SCREEN
ABO/RH(D): O POS
Antibody Screen: NEGATIVE
UNIT DIVISION: 0
UNIT DIVISION: 0
UNIT DIVISION: 0
UNIT DIVISION: 0
Unit division: 0

## 2017-05-28 LAB — CBC
HCT: 29.4 % — ABNORMAL LOW (ref 39.0–52.0)
HCT: 29.1 % — ABNORMAL LOW (ref 39.0–52.0)
Hemoglobin: 9.8 g/dL — ABNORMAL LOW (ref 13.0–17.0)
Hemoglobin: 10 g/dL — ABNORMAL LOW (ref 13.0–17.0)
MCH: 28.4 pg (ref 26.0–34.0)
MCH: 29.2 pg (ref 26.0–34.0)
MCHC: 33.3 g/dL (ref 30.0–36.0)
MCHC: 34.4 g/dL (ref 30.0–36.0)
MCV: 85.2 fL (ref 78.0–100.0)
MCV: 84.8 fL (ref 78.0–100.0)
PLATELETS: 297 10*3/uL (ref 150–400)
Platelets: 327 10*3/uL (ref 150–400)
RBC: 3.45 MIL/uL — ABNORMAL LOW (ref 4.22–5.81)
RBC: 3.43 MIL/uL — ABNORMAL LOW (ref 4.22–5.81)
RDW: 18.6 % — AB (ref 11.5–15.5)
RDW: 18.6 % — ABNORMAL HIGH (ref 11.5–15.5)
WBC: 23.3 10*3/uL — AB (ref 4.0–10.5)
WBC: 25.9 10*3/uL — ABNORMAL HIGH (ref 4.0–10.5)

## 2017-05-28 LAB — BPAM RBC
BLOOD PRODUCT EXPIRATION DATE: 201904052359
BLOOD PRODUCT EXPIRATION DATE: 201904062359
Blood Product Expiration Date: 201904052359
Blood Product Expiration Date: 201904052359
Blood Product Expiration Date: 201904062359
ISSUE DATE / TIME: 201903081031
ISSUE DATE / TIME: 201903081031
ISSUE DATE / TIME: 201903100901
ISSUE DATE / TIME: 201903111139
ISSUE DATE / TIME: 201903111522
UNIT TYPE AND RH: 5100
Unit Type and Rh: 5100
Unit Type and Rh: 5100
Unit Type and Rh: 5100
Unit Type and Rh: 5100

## 2017-05-28 LAB — RENAL FUNCTION PANEL
ALBUMIN: 1.7 g/dL — AB (ref 3.5–5.0)
Anion gap: 10 (ref 5–15)
BUN: 34 mg/dL — AB (ref 6–20)
CALCIUM: 7.7 mg/dL — AB (ref 8.9–10.3)
CHLORIDE: 105 mmol/L (ref 101–111)
CO2: 24 mmol/L (ref 22–32)
CREATININE: 1.41 mg/dL — AB (ref 0.61–1.24)
GFR, EST AFRICAN AMERICAN: 58 mL/min — AB (ref >60)
GFR, EST NON AFRICAN AMERICAN: 50 mL/min — AB (ref >60)
Glucose, Bld: 125 mg/dL — ABNORMAL HIGH (ref 65–99)
PHOSPHORUS: 3.4 mg/dL (ref 2.5–4.6)
Potassium: 4.2 mmol/L (ref 3.5–5.1)
SODIUM: 139 mmol/L (ref 135–145)

## 2017-05-28 LAB — GLUCOSE, CAPILLARY
GLUCOSE-CAPILLARY: 128 mg/dL — AB (ref 65–99)
GLUCOSE-CAPILLARY: 129 mg/dL — AB (ref 65–99)
GLUCOSE-CAPILLARY: 153 mg/dL — AB (ref 65–99)
GLUCOSE-CAPILLARY: 113 mg/dL — AB (ref 65–99)
Glucose-Capillary: 142 mg/dL — ABNORMAL HIGH (ref 65–99)
Glucose-Capillary: 148 mg/dL — ABNORMAL HIGH (ref 65–99)

## 2017-05-28 LAB — URINE CULTURE

## 2017-05-28 LAB — HEPARIN LEVEL (UNFRACTIONATED): Heparin Unfractionated: 0.71 IU/mL — ABNORMAL HIGH (ref 0.30–0.70)

## 2017-05-28 MED ORDER — JEVITY 1.5 CAL/FIBER PO LIQD
1000.0000 mL | ORAL | Status: DC
  Administered 2017-05-29 – 2017-05-30 (×2): 1000 mL
  Filled 2017-05-28 (×6): qty 1000

## 2017-05-28 MED ORDER — HEPARIN (PORCINE) IN NACL 100-0.45 UNIT/ML-% IJ SOLN
1850.0000 [IU]/h | INTRAMUSCULAR | Status: DC
  Administered 2017-05-28: 1700 [IU]/h via INTRAVENOUS
  Administered 2017-05-28: 1800 [IU]/h via INTRAVENOUS
  Administered 2017-05-29 – 2017-05-31 (×3): 1550 [IU]/h via INTRAVENOUS
  Administered 2017-05-31: 1850 [IU]/h via INTRAVENOUS
  Filled 2017-05-28 (×7): qty 250

## 2017-05-28 MED ORDER — ADULT MULTIVITAMIN LIQUID CH
15.0000 mL | Freq: Every day | ORAL | Status: DC
  Administered 2017-05-28 – 2017-06-08 (×12): 15 mL via ORAL
  Filled 2017-05-28 (×12): qty 15

## 2017-05-28 MED ORDER — PRO-STAT SUGAR FREE PO LIQD
30.0000 mL | Freq: Two times a day (BID) | ORAL | Status: DC
  Administered 2017-05-29 – 2017-06-10 (×25): 30 mL
  Filled 2017-05-28 (×25): qty 30

## 2017-05-28 MED ORDER — COLLAGENASE 250 UNIT/GM EX OINT
TOPICAL_OINTMENT | Freq: Every day | CUTANEOUS | Status: DC
  Administered 2017-05-28: 18:00:00 via TOPICAL
  Administered 2017-05-29: 1 via TOPICAL
  Administered 2017-05-30 – 2017-06-04 (×6): via TOPICAL
  Administered 2017-06-05: 1 via TOPICAL
  Administered 2017-06-06 – 2017-06-07 (×2): via TOPICAL
  Administered 2017-06-09: 1 via TOPICAL
  Administered 2017-06-10 – 2017-06-13 (×4): via TOPICAL
  Filled 2017-05-28 (×3): qty 30

## 2017-05-28 NOTE — Progress Notes (Signed)
ANTICOAGULATION CONSULT NOTE  Pharmacy Consult for Heparin Indication: DVT/PE  No Known Allergies  Patient Measurements: Height: 5\' 10"  (177.8 cm) Weight: 165 lb (74.8 kg) IBW/kg (Calculated) : 73 Heparin Dosing Weight:  72.6 kg  Vital Signs: Temp: 99.5 F (37.5 C) (03/12 1552) Temp Source: Oral (03/12 1552) BP: 100/65 (03/12 1600) Pulse Rate: 106 (03/12 1600)  Labs: Recent Labs    05/26/17 0950  05/27/17 0202  05/27/17 2056 05/28/17 0228 05/28/17 1710  HGB  --   --  7.6*   < > 9.6* 9.8* 10.0*  HCT  --   --  23.1*   < > 28.3* 29.4* 29.1*  PLT  --   --  279  --   --  297 327  HEPARINUNFRC  --    < > 0.11*  --   --  <0.10* 0.71*  CREATININE 1.37*  --  1.38*  1.34*  --   --  1.41*  --    < > = values in this interval not displayed.   Estimated Creatinine Clearance: 53.2 mL/min (A) (by C-G formula based on SCr of 1.41 mg/dL (H)).  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 2/22 IVC filter placed. Heparin infusion held yesterday for low HgB requiring 2 uPRBC. No overt bleeding bleeding reported by RN or MD. PLTc stable WNL. The provider has asked pharmacy to resume heparin while vascular is consulted about oral anticoagulation options.   Heparin level this evening is slightly supratherapeutic at 0.71. No bleeding noted.  Goal of Therapy:  Heparin level 0.3-0.7 units/ml Monitor platelets by anticoagulation protocol: Yes   Plan:  Reduce heparin gtt to 1700 units/hr  8 hour heparin level  Daily heparin level/CBC Monitor for s/sx of bleeding F/U transition to oral anticoagualtion   Thank you for allowing us to participate in this patients care.  Signe Coltonya C Kerry Chisolm, PharmD Clinical phone for 05/28/2017 from 3:30-10:30p: [phone] If after 10:30p, please call main pharmacy at: [phone] 05/28/2017 6:05 PM

## 2017-05-28 NOTE — Consult Note (Addendum)
WOC Nurse wound consult note Reason for Consult: Sacral pressure injury worsening Wound type: Unstageable pressure injury Present on admission:  No  Measurement: 8 cm x 6.5 cm x unknown depth Wound bed: 75% yellow slough or dried eschar; remainder pink  Drainage (amount, consistency, odor) no odor, minimal serosanginous on existing foam dressing Periwound: intact Dressing procedure/placement/frequency: Santyl and saline moistened gauze daily. Air mattress to relieve pressure. Thank you for the consult.  Discussed plan of care with the patient and bedside nurse.  WOC nurse will not follow at this time.  Please re-consult the WOC team if needed.  Helmut MusterSherry Corbitt Cloke, RN, MSN, CWOCN, CNS-BC, pager [phone]

## 2017-05-28 NOTE — Progress Notes (Signed)
Physical Therapy Treatment Patient Details Name: Joseph Downs MRN: [id number] DOB: Jul 18, 1950 Today's Date: 05/28/2017    History of Present Illness Pt is a 67 year old man who was found down, unresponsive and hypothermic on 05/01/17. Developed respiratory failure and intubated 2/13. Pt also with encephalopathy, shock, NSTEMI, acute renal failure, bilateral DVTs, massive PE, Bil DVTs, massive PE, bactermeia. IVC filter placed 2/22. Extubated 2/26, but re-intubated later that day. Trach placed 2/27. Peg placed 2/28. CT 3/7 negative for acute intracranial abnormality. 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 No medical history on file.   PT Comments    Pt slowly progressing with mobility. Requires maxA+2 for bed mobility; dependent on pericare with bowel incontinence. Today's session focused on sitting tolerance at EOB. Pt able to sit with minA progressing to intermittent min guard for balance; able to prop onto R elbow and alternate between UE support with min guard-modA. Answering yes/no questions appropriately, but slow to respond and not consistent with verbal answers. Deferred transfer to chair secondary to undressed sacral wound. Pt with complicated social situation; pending pt's support, continue to recommend CIR-level therapies to maximize functional mobility and independence. If not, will need SNF.    Follow Up Recommendations  CIR     Equipment Recommendations  (TBD)    Recommendations for Other Services       Precautions / Restrictions Precautions Precautions: Fall Precaution Comments: Bilat AKAs, sacral wound, trach, PEG Restrictions Weight Bearing Restrictions: Yes RLE Weight Bearing: Non weight bearing LLE Weight Bearing: Non weight bearing    Mobility  Bed Mobility Overal bed mobility: Needs Assistance Bed Mobility: Rolling;Supine to Sit;Sit to Supine Rolling: Max assist   Supine to sit: Max assist;HOB elevated Sit to supine: Max assist;HOB elevated;+2 for physical  assistance   General bed mobility comments: MaxA to roll R/L for pericare as pt with bowel incontinence. MaxA+2 to perform sit<>supine. Once sitting, able to progress to min guard for sitting balance  Transfers                 General transfer comment: Not attempted secondary to significant sacral wound  Ambulation/Gait                 Stairs            Wheelchair Mobility    Modified Rankin (Stroke Patients Only)       Balance Overall balance assessment: Needs assistance Sitting-balance support: Bilateral upper extremity supported;Feet unsupported Sitting balance-Leahy Scale: Fair Sitting balance - Comments: Able to progress to sitting with intermittent min guard to modA for balance, requiring cues to translate weight anteriorly. Able to prob onto R elbow with minA, demonstrating slowed balance reactions with BUE support Postural control: Posterior lean                                  Cognition Arousal/Alertness: Awake/alert Behavior During Therapy: Flat affect Overall Cognitive Status: Difficult to assess Area of Impairment: Orientation;Attention;Following commands;Safety/judgement;Awareness;Problem solving                 Orientation Level: Disoriented to;Time;Situation Current Attention Level: Sustained   Following Commands: Follows one step commands with increased time;Follows one step commands inconsistently;Follows multi-step commands inconsistently Safety/Judgement: Decreased awareness of deficits;Decreased awareness of safety Awareness: Intellectual Problem Solving: Slow processing;Decreased initiation;Difficulty sequencing;Requires verbal cues General Comments: Pt inconsistent with answering questions; able to state [place], but never answered if in hospital. Responds appropriately to yes/no  questions, but not always verbalizing answers. Decreased coordination in bilat hands (L>R) requiring increased time and cues to grab  objects and press button on call bell. Attempting to eat food with finger instead of fork      Exercises      General Comments        Pertinent Vitals/Pain Pain Assessment: Faces Faces Pain Scale: Hurts little more Pain Location: BLEs, sacral wound  Pain Descriptors / Indicators: Grimacing;Guarding;Sore Pain Intervention(s): Monitored during session;Limited activity within patient's tolerance;Repositioned    Home Living                      Prior Function            PT Goals (current goals can now be found in the care plan section) Acute Rehab PT Goals Patient Stated Goal: unable to state PT Goal Formulation: With patient Time For Goal Achievement: 06/10/17 Potential to Achieve Goals: Fair Progress towards PT goals: Progressing toward goals    Frequency    Min 3X/week      PT Plan Current plan remains appropriate    Co-evaluation              AM-PAC PT "6 Clicks" Daily Activity  Outcome Measure  Difficulty turning over in bed (including adjusting bedclothes, sheets and blankets)?: Unable Difficulty moving from lying on back to sitting on the side of the bed? : Unable Difficulty sitting down on and standing up from a chair with arms (e.g., wheelchair, bedside commode, etc,.)?: Unable Help needed moving to and from a bed to chair (including a wheelchair)?: Total Help needed walking in hospital room?: Total Help needed climbing 3-5 steps with a railing? : Total 6 Click Score: 6    End of Session Equipment Utilized During Treatment: Oxygen(trach) Activity Tolerance: Patient tolerated treatment well;Patient limited by fatigue Patient left: in bed;with call bell/phone within reach;with bed alarm set Nurse Communication: Mobility status;Need for lift equipment PT Visit Diagnosis: Other abnormalities of gait and mobility (R26.89);Muscle weakness (generalized) (M62.81)     Time: 1610-9604 PT Time Calculation (min) (ACUTE ONLY): 34 min  Charges:   $Therapeutic Activity: 8-22 mins                    G Codes:      Ina Homes, PT, DPT Acute Rehab Services  Pager: [phone]  [name] 05/28/2017, 4:24 PM

## 2017-05-28 NOTE — Progress Notes (Signed)
Orthopedic Tech Progress Note Patient Details:  Joseph FloridaLarry E Downs 06-Dec-1950 [id number] Called [employer] for replacement braces. Patient ID: Joseph FloridaLarry E Downs, male   DOB: 06-Dec-1950, 67 y.o.   MRN: [id number]   Joseph Downs, Joseph Downs 05/28/2017, 3:06 PM

## 2017-05-28 NOTE — Progress Notes (Signed)
Occupational Therapy Treatment/Re-evaluation  Patient Details Name: Joseph Downs MRN: [id number] DOB: 02-Apr-1950 Today's Date: 05/28/2017    History of present illness Pt is a 67 year old man who was found down, unresponsive and hypothermic on 05/01/17. Developed respiratory failure and intubated 2/13. Pt also with encephalopathy, shock, NSTEMI, acute renal failure, bilateral DVTs, massive PE, Bil DVTs, massive PE, bactermeia. IVC filter placed 2/22. Extubated 2/26, but re-intubated later that day. Trach placed 2/27. Peg placed 2/28. CT 3/7 negative for acute intracranial abnormality. 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 No medical history on file.   OT comments  Pt reevaluation s/p B AKAs. Goals remain appropriate. Session limited to bed/EOB due to significant sacral pain and wound. Pt continues to require min to total assist for ADL and 2 person assist for mobility. Recommending SNF, anticipate pt will need extensive rehab. Will continue to follow.  Follow Up Recommendations  SNF;Supervision/Assistance - 24 hour    Equipment Recommendations       Recommendations for Other Services      Precautions / Restrictions Precautions Precautions: Fall Precaution Comments: Bilat AKAs, sacral wound, trach, PEG Restrictions Weight Bearing Restrictions: No RLE Weight Bearing: Non weight bearing LLE Weight Bearing: Non weight bearing       Mobility Bed Mobility Overal bed mobility: Needs Assistance Bed Mobility: Rolling;Supine to Sit;Sit to Supine Rolling: Max assist   Supine to sit: Max assist;HOB elevated;+2 for physical assistance Sit to supine: Max assist;HOB elevated;+2 for physical assistance   General bed mobility comments: MaxA to roll R/L for pericare as pt with bowel incontinence. MaxA+2 to perform sit<>supine. Once sitting, able to progress to min guard for sitting balance  Transfers                 General transfer comment: Not attempted secondary to significant sacral  wound    Balance Overall balance assessment: Needs assistance Sitting-balance support: Bilateral upper extremity supported;Feet unsupported Sitting balance-Leahy Scale: Fair Sitting balance - Comments: Able to progress to sitting with intermittent min guard to modA for balance, requiring cues to translate weight anteriorly. Able to prob onto R elbow with minA, demonstrating slowed balance reactions with BUE support Postural control: Posterior lean                                 ADL either performed or assessed with clinical judgement   ADL Overall ADL's : Needs assistance/impaired Eating/Feeding: Moderate assistance;Bed level Eating/Feeding Details (indicate cue type and reason): pt bringing hand to mouth as if he was holding a utensil, but none there Grooming: Wash/dry face;Bed level;Minimal assistance   Upper Body Bathing: Total assistance;Sitting   Lower Body Bathing: Total assistance;Bed level   Upper Body Dressing : Maximal assistance;Sitting   Lower Body Dressing: Total assistance;Bed level       Toileting- Clothing Manipulation and Hygiene: Total assistance;Bed level Toileting - Clothing Manipulation Details (indicate cue type and reason): pt with bowel incontinence, without awareness             Vision       Perception     Praxis      Cognition Arousal/Alertness: Awake/alert Behavior During Therapy: Flat affect Overall Cognitive Status: Impaired/Different from baseline Area of Impairment: Orientation;Attention;Following commands;Safety/judgement;Awareness;Problem solving                 Orientation Level: Disoriented to;Time;Situation Current Attention Level: Sustained   Following Commands: Follows one step commands with increased  time;Follows one step commands inconsistently;Follows multi-step commands inconsistently Safety/Judgement: Decreased awareness of deficits;Decreased awareness of safety Awareness: Intellectual Problem  Solving: Slow processing;Decreased initiation;Difficulty sequencing;Requires verbal cues General Comments: Pt inconsistent with answering questions; able to state [place], but never answered if in hospital. Responds appropriately to yes/no questions, but not always verbalizing answers. Decreased coordination in bilat hands (L>R) requiring increased time and cues to grab objects and press button on call bell. Attempting to eat food with finger instead of fork        Exercises     Shoulder Instructions       General Comments      Pertinent Vitals/ Pain       Pain Assessment: Faces Faces Pain Scale: Hurts little more Pain Location: BLEs, sacral wound  Pain Descriptors / Indicators: Grimacing;Guarding;Sore Pain Intervention(s): Monitored during session;Repositioned  Home Living                                          Prior Functioning/Environment              Frequency  Min 2X/week        Progress Toward Goals  OT Goals(current goals can now be found in the care plan section)  Progress towards OT goals: Progressing toward goals  Acute Rehab OT Goals Patient Stated Goal: unable to state OT Goal Formulation: With patient Time For Goal Achievement: 06/04/17 Potential to Achieve Goals: Fair  Plan Discharge plan needs to be updated    Co-evaluation    PT/OT/SLP Co-Evaluation/Treatment: Yes Reason for Co-Treatment: For patient/therapist safety   OT goals addressed during session: Strengthening/ROM      AM-PAC PT "6 Clicks" Daily Activity     Outcome Measure   Help from another person eating meals?: A Lot Help from another person taking care of personal grooming?: A Lot Help from another person toileting, which includes using toliet, bedpan, or urinal?: Total Help from another person bathing (including washing, rinsing, drying)?: Total Help from another person to put on and taking off regular upper body clothing?: A Lot Help from another  person to put on and taking off regular lower body clothing?: Total 6 Click Score: 9    End of Session Equipment Utilized During Treatment: Oxygen  OT Visit Diagnosis: Pain;Muscle weakness (generalized) (M62.81);Other symptoms and signs involving cognitive function   Activity Tolerance Patient limited by pain   Patient Left in bed;with bed alarm set;with call bell/phone within reach   Nurse Communication Mobility status(RN to call [employer] about soiled amputee garment)        Time: [id number] OT Time Calculation (min): 34 min  Charges: OT General Charges $OT Visit: 1 Visit OT Evaluation $OT Re-eval: 1 Re-eval  05/28/2017 Joseph RoundJulie Vernie Downs, OTR/L Pager: [phone]  Joseph Downs, Joseph BailiffJulie Downs 05/28/2017, 4:47 PM

## 2017-05-28 NOTE — Progress Notes (Signed)
  Speech Language Pathology Treatment: Dysphagia  Patient Details Name: Melonie FloridaLarry E Skora MRN: [id number] DOB: Jun 26, 1950 Today's Date: 05/28/2017 Time: [id number] SLP Time Calculation (min) (ACUTE ONLY): 12 min  Assessment / Plan / Recommendation Clinical Impression  Observation with po's after FEES yesterday. Suboptimal positioning as bed would not go to reverse Trendelenburg position. Orally manipulated puree peaches then peaches mixed with puree eggs with functional transit without residue. Delayed cough fairly consistent; unable to expel mucous via trach- pt has not been tolerating PMV therefore dysphagia session are sans speaking valve. Janina Mayorach may be changed to cuffless soon per RN which may assist in upper airway patency and improved use with PMV. Will continue to follow closely with diet recommendations.    HPI HPI: 67 year old homeless man found down/13 with hypothermia, shock lactic acidosis. 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 HehadAcinetobacter in blood and staph in urine with elevated PSA. Unable to be weaned from vent (ETT 2/13-2/27), trach placed 2/27, PEG 2/28.       SLP Plan  Continue with current plan of care       Recommendations  Diet recommendations: Dysphagia 1 (puree);Honey-thick liquid Liquids provided via: Cup Medication Administration: Crushed with puree Supervision: Patient able to self feed;Staff to assist with self feeding;Full supervision/cueing for compensatory strategies Compensations: Slow rate;Small sips/bites;Minimize environmental distractions;Clear throat intermittently;Lingual sweep for clearance of pocketing Postural Changes and/or Swallow Maneuvers: Seated upright 90 degrees                Oral Care Recommendations: Oral care BID Follow up Recommendations: Skilled Nursing facility SLP Visit Diagnosis: Dysphagia, oropharyngeal phase (R13.12) Plan: Continue with current plan of care       GO                 [name], Jayne Peckenpaugh Willis 05/28/2017, 11:10 AM   Breck CoonsLisa Willis Lonell FaceLitaker M.Ed [employer]-SLP Pager [phone]

## 2017-05-28 NOTE — Procedures (Signed)
First Trach Change  Size 6 cuffed shiley removed over a catheter and size 6 cuffless shiley placed without difficulty with good color change and BS bilaterally.  Alyson ReedyWesam G. Daryle Boyington, M.D. [facility] Pulmonary/Critical Care Medicine. Pager: [phone]. After hours pager: [phone]

## 2017-05-28 NOTE — Progress Notes (Signed)
PULMONARY MEDICINE follow up   Name: Melonie FloridaLarry E Tensley MRN: [id number] DOB: [birth year]    ADMISSION DATE:  05/01/2017 CONSULTATION DATE:  05/01/17 REFERRING MD:  Dr. Deretha EmoryZackowski CHIEF COMPLAINT:  Found down  HISTORY OF PRESENT ILLNESS:   67 year old homeless man found down/13 with hypothermia, shock lactic acidosis. Found to have bilateral DVTs and evidence of RV strain on echo He had Acinetobacter in blood and staph in urine with elevated PSA. Unable to be weaned from vent, trach placed 2/27, plans to place PEG 2/28  STUDIES:  ECHO 2/14 - EF 65-70%, G1DD, RV strain Echo 2/21 - EF 55-60%, RV improved  CULTURES: MRSA PCR negative Ucx + staph lugdenensis Blood cx 2/13 >> Diphtheroids + Acinetobacter lwoffii Blood cx 2/19 >>no growth x 5 days Blood cx 2/20 >> no growth x 5 days  ANTIBIOTICS: Zosyn 2/13> 2/18 Vanc 2/13 >>2/19 Meropenem 2/18 >>  SIGNIFICANT EVENTS: 2/13 admitted, intubated 2/22 IVC filter placed 2/25 1u PRBC 2/27 Trach  LINES/TUBES: ETT 2/13 >> 2/27 Trach 2/27>>> Foley 2/13>>> PEG 2/28>>> Right IJ HD catheter 2/15 >>out  Left IJ CVC 2/15 >>out  SUBJECTIVE:  No events overnight, passed swallow evaluation and tolerated cuff down overnight, eating  Today's Vitals   05/28/17 0407 05/28/17 0723 05/28/17 0727 05/28/17 0900  BP:  112/62 112/62   Pulse:  89 91   Resp:  19 17   Temp:    99 F (37.2 C)  TempSrc:    Oral  SpO2: 100% 100% 100%   Weight:      Height:      PainSc:          Intake/Output Summary (Last 24 hours) at 05/28/2017 0945 Last data filed at 05/28/2017 0801 Gross per 24 hour  Intake 3950 ml  Output 2625 ml  Net 1325 ml    PHYSICAL EXAMINATION: General: Chronically ill appearing male, on TC, NAD HEENT: Cuffed 6 shiley in place Pulmonary: Coarse BS diffusely Cardiac: RRR, Nl S1/S2 and -M/R/G. Abdomen: Soft, NT, ND and +BS, PEG in place Neuro/psych: Opens eyes Extremities/musculoskeletal: bilateral AKA- bandaged  LABS:  BMP  Latest Ref Rng & Units 05/28/2017 05/27/2017 05/27/2017  Glucose 65 - 99 mg/dL 132(G125(H) 401(U159(H) 272(Z160(H)  BUN 6 - 20 mg/dL 36(U34(H) 44(I31(H) 34(V31(H)  Creatinine 0.61 - 1.24 mg/dL 4.25(Z1.41(H) 5.63(O1.38(H) 7.56(E1.34(H)  Sodium 135 - 145 mmol/L 139 136 137  Potassium 3.5 - 5.1 mmol/L 4.2 4.0 4.0  Chloride 101 - 111 mmol/L 105 104 105  CO2 22 - 32 mmol/L 24 22 22   Calcium 8.9 - 10.3 mg/dL 7.7(L) 6.9(L) 7.0(L)     05/17/17 cxr I reviewed CXR myself, trach is in good position  ASSESSMENT / PLAN: Acute respiratory failure on ventilator- trach 2/27 Likely pulmonary embolism with associated right heart strain Toxic metabolic encephalopathy Agitation Stress related non-ST elevation MI, suspect right heart strain Acute Cor pulmonale Acute non-oliguric renal failure; creatinine is improved some Rhabdomyolysis Fluid and electrolyte imbalance: Hypernatremia, hypokalemia, metabolic acidosis  Acinetobacter bacteremia. 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 [facility] Pulmonary/Critical Care Medicine. Pager: [phone]. After hours pager: [phone].

## 2017-05-28 NOTE — Progress Notes (Addendum)
Nutrition Follow-up  DOCUMENTATION CODES:   Non-severe (moderate) malnutrition in context of social or environmental circumstances  INTERVENTION:   -D/c Vital AF 1.2  -Initiate Jevity 1.5 @ 25 ml/hr via PEG and increase by 10 ml every 4 hours to goal rate of 55 ml/hr.   30 ml Prostat BID.    Tube feeding regimen provides 2180 kcal (100% of needs), 114 grams of protein, and 1003 ml of H2O.   -Add 15 ml liquid MVI daily via tube  NUTRITION DIAGNOSIS:   Moderate Malnutrition related to social / environmental circumstances as evidenced by mild fat depletion, mild muscle depletion.  Ongoing  GOAL:   Patient will meet greater than or equal to 90% of their needs  Met with TF  MONITOR:   Diet advancement, Labs, Weight trends, Skin, I & O's, TF tolerance  REASON FOR ASSESSMENT:   Consult Enteral/tube feeding initiation and management  ASSESSMENT:   67 yo male with no PMH who was admitted on 2/13 after being found unresponsive outside by police with hypothermia, hypotension, elevated lactic acid. Required intubation on admission. Found to have extensive bilateral DVTs.  11/27- s/p trach 2/28- s/p PEG placement 3/6- transferred from ICU to SDU 3/8- s/p bilateral AKA 2/11- s/p FEES- advanced to a dysphagia 1 diet with honey thick liquids  Per CWOCN note today, sacral wound has evolved to an unstageable pressure injury.   Pt very lethargic at time of visit. Observed meal tray, which was untouched. Vital AF 1.2 infusing via PEG @ 75 ml/hr, which provides 2160 kcal, 135 gm protein, 1460 ml free water daily, meeting 100% of estimated kcal needs and >100% of protein needs  Medications reviewed and include melatonin and thiamine.   Labs reviewed: CBGS: 128-142 (inpatient orders for glycemic control are 2-6 units insulin aspart every 4 hours).   Diet Order:  DIET - DYS 1 Room service appropriate? Yes; Fluid consistency: Honey Thick  EDUCATION NEEDS:   No education needs have  been identified at this time  Skin:  Skin Assessment: Skin Integrity Issues: Skin Integrity Issues:: Stage II, Unstageable Stage II: worsening- advanced to unstageable Unstageable: sacrum Other: bilateral AKA incisions  Last BM:  05/27/17  Height:   Ht Readings from Last 1 Encounters:  05/14/17 5' 10"  (1.778 m)    Weight:   Wt Readings from Last 1 Encounters:  05/28/17 165 lb (74.8 kg)    Ideal Body Weight:  63.4 kg  BMI:  Body mass index is 23.68 kg/m.  Estimated Nutritional Needs:   Kcal:  2000-2200  Protein:  110-125 grams  Fluid:  >2.0 L    Deeanne Deininger A. Jimmye Norman, RD, LDN, CDE Pager: [phone] After hours Pager: [phone]

## 2017-05-28 NOTE — Progress Notes (Signed)
ANTICOAGULATION CONSULT NOTE  Pharmacy Consult for Heparin Indication: DVT/PE  No Known Allergies  Patient Measurements: Height: 5\' 10"  (177.8 cm) Weight: 165 lb (74.8 kg) IBW/kg (Calculated) : 73 Heparin Dosing Weight:  72.6 kg  Vital Signs: Temp: 97.6 F (36.4 C) (03/12 0309) Temp Source: Oral (03/12 0309) BP: 112/62 (03/12 0727) Pulse Rate: 91 (03/12 0727)  Labs: Recent Labs    05/26/17 0338 05/26/17 0950 05/26/17 1327 05/27/17 0202 05/27/17 0824 05/27/17 2056 05/28/17 0228  HGB 7.7*  --   --  7.6* 7.2* 9.6* 9.8*  HCT 24.0*  --   --  23.1* 21.5* 28.3* 29.4*  PLT 251  --   --  279  --   --  297  HEPARINUNFRC  --   --  0.33 0.11*  --   --  <0.10*  CREATININE 1.55* 1.37*  --  1.38*  1.34*  --   --  1.41*   Estimated Creatinine Clearance: 53.2 mL/min (A) (by C-G formula based on SCr of 1.41 mg/dL (H)).  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 2/22 IVC filter placed. Heparin infusion held yesterday for low HgB requiring 2 uPRBC. No overt bleeding bleeding reported by RN or MD. PLTc stable WNL. The provider has asked pharmacy to resume heparin while vascular is consulted about oral anticoagulation options.   Goal of Therapy:  Heparin level 0.3-0.7 units/ml Monitor platelets by anticoagulation protocol: Yes   Plan:  Resume heparin gtt at 1800 units/hr  8 hour heparin level  Daily heparin level/CBC Monitor for s/sx of bleeding F/U transition to oral anticoagualtion  Ruben Imony Ever Halberg, PharmD Clinical Pharmacist 05/28/2017 8:34 AM

## 2017-05-28 NOTE — Progress Notes (Signed)
PROGRESS NOTE    KINGSLY KLOEPFER  XKP:[id number] DOB: 1950/07/19 DOA: 05/01/2017 PCP: Patient, No Pcp Per   Brief Narrative:  Mr. Binns is a 67 yo M, reportedly homeless, no past medical history that I can find from his recent admission to our service other than obstructive uropathy requiring foley.  Evidently, on 2/13, he was found down in the cold.  Brought to the ER, hypotensive, had Lactate >7 despite 30 cc/kg IVF and re-warming, was admitted to ICU, at that time was mentating enough to explain that he had passed out, not clear why.  That night, developed respiratory distress requiring intubation and hypotension. Following that, was treated with broad spectrum antibiotics for hypotension, tachycardia and leukocytosis. Renal failure, creatinine up to 7. Started on CVVHD. Blood cultures grew Acineto lwoffii, Urine cx frew staph lugdunensis.  Echo showed severe RV strain and bilateral LE dopplers showed extensive DVT.  CTA not possible, nor catheterization/lysis due to AKI.  Started on heparin.  At time of transfer out of ICU to [facility] service, patient has tolerated 2 days intermittent trach collar, back on vent at night.  Has PEG.  Cardiology, Nephrology, Vascular surgery, and Pulm are following. S/p B/L AKA  Patient developed sepsis secondary to UTI. Placed on zosyn. Anemia secondary to blood loss from surgery, given transfusion, appears to be stable at this time. Had FEES and placed on dysphagia 1 diet. Placed back on heparin drip. Will discuss oral anticoagulation with vascular surgery.  Assessment & Plan   Sepsis secondary to UTI -Overnight on 3/8-11/2017, patient developed fever, leukocytosis, tachycardia, tachypnea -CXR reviewed and unremarkable for infection -UA: Few bacteria, 0-5 WBC, positive nitrates, trace leukocytes -Urine culture shows >100K GNR- pseudomonas aeruginosa, pending sensitivities -Blood cultures from 05/25/2017 show no growth to date -Placed on zosyn -continue IVF and  monitor   Acute blood loss anemia/Normocytic Anemia -Unclear etiology -recently had B/L AKA on 05/24/2017 -hemoglobin dropped from 9.4 to 7.7 on 05/26/2017, howevwer hemoglobin was 7.6 afterwards -transfused additional 2u PRBC on 05/27/2017, hemoglobin today 9.8 -placed back on heparin today -will continue to monitor CBC  Acute respiratory failure -PCCM follow as patient has trach collar -FEES done on 05/27/2017 and patient was placed on dysphagia 1 diet -per PCCM, trach will be changed to cuffless 6, no decannulation due to neuro status -Continue guaifenesin to aid with secretions  Presumed massive pulmonary embolism/Bilateral DVT -Status post IVC filter-placed due to doubts of patient's future compliance with oral anticoagulation  -will discuss anticoagulation options with vascular surgery -restarted IV heparin today  Acute kidney injury -History of recent obstructive uropathy. ?  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)- needs an outpatient work up -He did have a Foley catheter previous hospitalization -upon admission, creatinine was 2.5, however peaked at 7.51 -Creatinine improved, currently 1.41  -suspect patient has CKD at baseline laboratory history in Epic is limited (creatinine in Jan 2019 was 1.2-1.4) -Nephrology consulted and appreciated and is now signed off -Continue to monitor BMP  Bacteremia -Cultures from 05/01/2017 showed diphtheroids, Acinetobacter lwoffii -Repeat blood cultures show no growth -Patient was placed on vancomycin and Zosyn however then transitioned to meropenem and completed course  Hypernatremia -Resolved with free water and IVF -sodium currently 139 -continue to monitor BMP  Acute metabolic encephalopathy -Avoid benzodiazepines -continue Haldol  NSTEMI -Cardiology consulted and appreciated, continue aspirin -Possibly related to PE  Presumed frostbite/Gangrene of feet -Vascular surgery  consulted and appreciated -Wound care consulted -Patient may need bilateral  lower extremity amputations -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  Corrected calcium 9  Goals of care -Have had couple discussions via phone as well as in person with patient's sister regarding patient's goals of care and possible recovery.  Discussed with her the possibility that his neuro status may and may not improve however time will tell.  Patient may also have depression given his current status and recent bilateral AKA.  DVT Prophylaxis heparin  Code Status: Full  Family Communication: None at bedside.    Disposition Plan: admitted. Dispo TBD-suspect SNF vs CIR.   Consultants PCCM Nephrology Cardiology Vascular surgery  Procedures  Intubated/extubated on 05/01/2017 through 05/15/2017 IVC filter placement 05/10/2017 Trach placed on 05/15/2017 PEG placed 05/16/2017 Bilateral AKA 05/24/2017  Antibiotics   Anti-infectives (From admission, onward)   Start     Dose/Rate Route Frequency Ordered Stop   05/27/17 1400  piperacillin-tazobactam (ZOSYN) IVPB 3.375 g     3.375 g 12.5 mL/hr over 240 Minutes Intravenous Every 8 hours 05/27/17 0724     05/27/17 0730  piperacillin-tazobactam (ZOSYN) IVPB 3.375 g     3.375 g 100 mL/hr over 30 Minutes Intravenous  Once 05/27/17 0724 05/27/17 0908   05/25/17 1300  cefTRIAXone (ROCEPHIN) 1 g in sodium chloride 0.9 % 100 mL IVPB  Status:  Discontinued     1 g 200 mL/hr over 30 Minutes Intravenous Every 24 hours 05/25/17 1200 05/27/17 0706   05/24/17 1800  ceFAZolin (ANCEF) IVPB 2g/100 mL premix     2 g 200 mL/hr over 30 Minutes Intravenous Every 8 hours 05/24/17 1300 05/25/17 0238   05/07/17 2200  meropenem (MERREM) 500 mg in sodium chloride 0.9 % 100 mL IVPB     500 mg 200 mL/hr over 30 Minutes Intravenous Every 24 hours 05/07/17 1306 05/19/17 1911   05/07/17 0930  meropenem  (MERREM) 1 g in sodium chloride 0.9 % 100 mL IVPB  Status:  Discontinued     1 g 200 mL/hr over 30 Minutes Intravenous Every 12 hours 05/07/17 0908 05/07/17 1306   05/06/17 1300  meropenem (MERREM) 500 mg in sodium chloride 0.9 % 100 mL IVPB  Status:  Discontinued     500 mg 200 mL/hr over 30 Minutes Intravenous Every 24 hours 05/06/17 1146 05/07/17 0908   05/04/17 2200  vancomycin (VANCOCIN) IVPB 1000 mg/200 mL premix     1,000 mg 200 mL/hr over 60 Minutes Intravenous Every 24 hours 05/04/17 0933 05/07/17 2339   05/04/17 1400  piperacillin-tazobactam (ZOSYN) IVPB 3.375 g  Status:  Discontinued     3.375 g 100 mL/hr over 30 Minutes Intravenous Every 6 hours 05/04/17 0932 05/06/17 1146   05/03/17 1300  vancomycin (VANCOCIN) IVPB 1000 mg/200 mL premix  Status:  Discontinued     1,000 mg 200 mL/hr over 60 Minutes Intravenous Every 48 hours 05/02/17 1340 05/04/17 0933   05/02/17 2000  piperacillin-tazobactam (ZOSYN) IVPB 2.25 g  Status:  Discontinued     2.25 g 100 mL/hr over 30 Minutes Intravenous Every 6 hours 05/02/17 1342 05/04/17 0932   05/02/17 1300  vancomycin (VANCOCIN) IVPB 1000 mg/200 mL premix  Status:  Discontinued     1,000 mg 200 mL/hr over 60 Minutes Intravenous Every 24 hours 05/01/17 1313 05/02/17 1340   05/01/17 2000  piperacillin-tazobactam (ZOSYN) IVPB 3.375 g  Status:  Discontinued     3.375 g 12.5 mL/hr over 240 Minutes Intravenous Every 8 hours  05/01/17 1313 05/02/17 1342   05/01/17 1200  vancomycin (VANCOCIN) 1,750 mg in sodium chloride 0.9 % 500 mL IVPB     1,750 mg 250 mL/hr over 120 Minutes Intravenous  Once 05/01/17 1121 05/01/17 1409   05/01/17 1130  piperacillin-tazobactam (ZOSYN) IVPB 3.375 g     3.375 g 100 mL/hr over 30 Minutes Intravenous  Once 05/01/17 1119 05/01/17 1217   05/01/17 1130  vancomycin (VANCOCIN) IVPB 1000 mg/200 mL premix  Status:  Discontinued     1,000 mg 200 mL/hr over 60 Minutes Intravenous  Once 05/01/17 1119 05/01/17 1121       Subjective:   [name] seen and examined today.  Awake, mildly interactive.  Objective:   Vitals:   05/28/17 0407 05/28/17 0723 05/28/17 0727 05/28/17 0900  BP:  112/62 112/62   Pulse:  89 91   Resp:  19 17   Temp:    99 F (37.2 C)  TempSrc:    Oral  SpO2: 100% 100% 100%   Weight:      Height:        Intake/Output Summary (Last 24 hours) at 05/28/2017 1019 Last data filed at 05/28/2017 0801 Gross per 24 hour  Intake 3950 ml  Output 2625 ml  Net 1325 ml   Filed Weights   05/26/17 0409 05/27/17 0327 05/28/17 0309  Weight: 73 kg (161 lb) 74.8 kg (165 lb) 74.8 kg (165 lb)   Exam  General: Well developed, well nourished, NAD, appears stated age  HEENT: NCAT, PERRLA, EOMI, Anicteic Sclera, mucous membranes moist.   Neck: Trach  Cardiovascular: S1 S2 auscultated, RRR, no murmur  Respiratory: Coarse breath sounds  Abdomen: Soft, nontender, nondistended, + bowel sounds, +PEG  Extremities: Bilateral AKA with dressing in place  Neuro: Awake, mildly interactive this morning does follow commands.  Data Reviewed: I have personally reviewed following labs and imaging studies  CBC: Recent Labs  Lab 05/24/17 0312 05/25/17 0220 05/26/17 0338 05/27/17 0202 05/27/17 0824 05/27/17 2056 05/28/17 0228  WBC 12.9* 16.5* 19.0* 22.1*  --   --  23.3*  HGB 8.2* 9.4* 7.7* 7.6* 7.2* 9.6* 9.8*  HCT 26.3* 29.3* 24.0* 23.1* 21.5* 28.3* 29.4*  MCV 94.6 91.6 92.7 89.9  --   --  85.2  PLT 350 331 251 279  --   --  417   Basic Metabolic Panel: Recent Labs  Lab 05/24/17 0312 05/25/17 0220 05/26/17 0338 05/26/17 0950 05/27/17 0202 05/28/17 0228  NA 147* 147* 140 137 136  137 139  K 4.4 4.0 3.8 3.6 4.0  4.0 4.2  CL 113* 113* 109 106 104  105 105  CO2 23 23 21* _0 GLUCOSE 99 139* 140* 218* 159*  160* 125*  BUN 37* 28* 29* 28* 31*  31* 34*  CREATININE 1.70* 1.54* 1.55* 1.37* 1.38*  1.34* 1.41*  CALCIUM 8.0* 7.2* 7.0* 6.7* 6.9*  7.0* 7.7*  PHOS 3.8  3.2  --  2.7 3.0 3.4   GFR: Estimated Creatinine Clearance: 53.2 mL/min (A) (by C-G formula based on SCr of 1.41 mg/dL (H)). Liver Function Tests: Recent Labs  Lab 05/24/17 0312 05/25/17 0220 05/26/17 0950 05/27/17 0202 05/28/17 0228  ALBUMIN 1.8* 2.0* 1.6* 1.5* 1.7*   No results for input(s): LIPASE, AMYLASE in the last 168 hours. No results for input(s): AMMONIA in the last 168 hours. Coagulation Profile: No results for input(s): INR, PROTIME in the last 168 hours. Cardiac Enzymes: No results for input(s): CKTOTAL, CKMB,  CKMBINDEX, TROPONINI in the last 168 hours. BNP (last 3 results) No results for input(s): PROBNP in the last 8760 hours. HbA1C: No results for input(s): HGBA1C in the last 72 hours. CBG: Recent Labs  Lab 05/27/17 1635 05/27/17 1939 05/27/17 2308 05/28/17 0311 05/28/17 0802  GLUCAP 128* 126* 134* 128* 129*   Lipid Profile: No results for input(s): CHOL, HDL, LDLCALC, TRIG, CHOLHDL, LDLDIRECT in the last 72 hours. Thyroid Function Tests: No results for input(s): TSH, T4TOTAL, FREET4, T3FREE, THYROIDAB in the last 72 hours. Anemia Panel: No results for input(s): VITAMINB12, FOLATE, FERRITIN, TIBC, IRON, RETICCTPCT in the last 72 hours. Urine analysis:    Component Value Date/Time   COLORURINE YELLOW 05/25/2017 0654   APPEARANCEUR CLEAR 05/25/2017 0654   LABSPEC 1.013 05/25/2017 0654   PHURINE 6.0 05/25/2017 0654   GLUCOSEU 50 (A) 05/25/2017 0654   HGBUR MODERATE (A) 05/25/2017 0654   BILIRUBINUR NEGATIVE 05/25/2017 0654   KETONESUR NEGATIVE 05/25/2017 0654   PROTEINUR NEGATIVE 05/25/2017 0654   NITRITE POSITIVE (A) 05/25/2017 0654   LEUKOCYTESUR TRACE (A) 05/25/2017 0654   Sepsis Labs: _0 (procalcitonin:4,lacticidven:4)  ) Recent Results (from the past 240 hour(s))  Urine Culture     Status: Abnormal (Preliminary result)   Collection Time: 05/25/17 10:10 AM  Result Value Ref Range Status   Specimen Description URINE, CLEAN CATCH   Final   Special Requests NONE  Final   Culture (A)  Final    >=100,000 COLONIES/mL PSEUDOMONAS AERUGINOSA REPEATING SENSITIVITIES Performed at [facility], [place] [address] [place], [place] [address]    Report Status PENDING  Incomplete  Culture, blood (routine x 2)     Status: None (Preliminary result)   Collection Time: 05/25/17 10:18 AM  Result Value Ref Range Status   Specimen Description BLOOD LEFT HAND  Final   Special Requests   Final    BOTTLES DRAWN AEROBIC AND ANAEROBIC Blood Culture adequate volume   Culture   Final    NO GROWTH 2 DAYS Performed at [facility], [place] [address], [place], [place] [address]    Report Status PENDING  Incomplete  Culture, blood (routine x 2)     Status: None (Preliminary result)   Collection Time: 05/25/17 10:32 AM  Result Value Ref Range Status   Specimen Description BLOOD RIGHT HAND  Final   Special Requests   Final    BOTTLES DRAWN AEROBIC AND ANAEROBIC Blood Culture adequate volume   Culture   Final    NO GROWTH 2 DAYS Performed at [facility], [place] [address], [place], [place] [address]    Report Status PENDING  Incomplete      Radiology Studies: No results found.   Scheduled Meds: . aspirin  81 mg Per Tube Daily  . chlorhexidine gluconate (MEDLINE KIT)  15 mL Mouth Rinse BID  . free water  250 mL Per Tube Q8H  . haloperidol  1 mg Oral Daily  . haloperidol  2 mg Oral QHS  . insulin aspart  2-6 Units Subcutaneous Q4H  . mouth rinse  15 mL Mouth Rinse QID  . Melatonin  6 mg Oral QHS  . pantoprazole sodium  40 mg Per Tube QHS  . sodium chloride flush  10-40 mL Intracatheter Q12H  . thiamine  100 mg Per Tube Daily   Continuous Infusions: . dextrose 75 mL/hr at 05/26/17 1900  . feeding supplement (VITAL AF 1.2 CAL) 1,000 mL (05/27/17 1824)  . heparin 1,800 Units/hr (05/28/17 0917)  .  lactated ringers Stopped (05/24/17 1900)  . magnesium sulfate 1 - 4 g bolus IVPB    . piperacillin-tazobactam (ZOSYN)  IV  Stopped (05/28/17 0905)     LOS: 27 days   Time Spent in minutes   45 minutes  Cejay Cambre D.O. on 05/28/2017 at 10:19 AM  Between 7am to 7pm - Pager - [phone]  After 7pm go to [email or web address] - password [email or web address]  And look for the night coverage person covering for me after hours  [facility] Office  [phone]

## 2017-05-28 NOTE — Progress Notes (Signed)
   VASCULAR SURGERY ASSESSMENT & PLAN:   4 Days Post-Op s/p: bilat AKA's  Continue daily dressing changes.    SUBJECTIVE:   Resting comfortably  PHYSICAL EXAM:   Vitals:   05/28/17 0407 05/28/17 0723 05/28/17 0727 05/28/17 0900  BP:  112/62 112/62   Pulse:  89 91   Resp:  19 17   Temp:    99 F (37.2 C)  TempSrc:    Oral  SpO2: 100% 100% 100%   Weight:      Height:       Bilat AKA's dressings are clean and dry.   LABS:   Lab Results  Component Value Date   WBC 23.3 (H) 05/28/2017   HGB 9.8 (L) 05/28/2017   HCT 29.4 (L) 05/28/2017   MCV 85.2 05/28/2017   PLT 297 05/28/2017   Lab Results  Component Value Date   CREATININE 1.41 (H) 05/28/2017   Lab Results  Component Value Date   INR 1.21 05/10/2017   CBG (last 3)  Recent Labs    05/27/17 2308 05/28/17 0311 05/28/17 0802  GLUCAP 134* 128* 129*    PROBLEM LIST:    Active Problems:   Lactic acidemia   Acute respiratory failure (HCC)   Exposure to weather condition   Hypotension   Non-ST elevation (NSTEMI) myocardial infarction (HCC)   Malnutrition of moderate degree   Acute cystitis with hematuria   Sepsis ([place])   Acute deep vein thrombosis (DVT) of proximal vein of both lower extremities (HCC)   Pressure injury of skin   Cardiogenic shock (HCC)   Acute cor pulmonale (HCC)   Acute respiratory failure with hypoxemia (HCC)   Deep vein thrombosis (DVT) of non-extremity vein   SIRS (systemic inflammatory response syndrome) (HCC)   S/P AKA (above knee amputation) bilateral (HCC)   Hypernatremia   Acute blood loss anemia   Encephalopathy   Postoperative pain   Pulmonary embolus (HCC)   CURRENT MEDS:   . aspirin  81 mg Per Tube Daily  . chlorhexidine gluconate (MEDLINE KIT)  15 mL Mouth Rinse BID  . free water  250 mL Per Tube Q8H  . haloperidol  1 mg Oral Daily  . haloperidol  2 mg Oral QHS  . insulin aspart  2-6 Units Subcutaneous Q4H  . mouth rinse  15 mL Mouth Rinse QID  . Melatonin  6  mg Oral QHS  . pantoprazole sodium  40 mg Per Tube QHS  . sodium chloride flush  10-40 mL Intracatheter Q12H  . thiamine  100 mg Per Tube Daily    Deitra Mayo Beeper: [phone] Office: [phone] 05/28/2017

## 2017-05-29 LAB — CBC
HCT: 26 % — ABNORMAL LOW (ref 39.0–52.0)
HEMOGLOBIN: 8.8 g/dL — AB (ref 13.0–17.0)
MCH: 29.2 pg (ref 26.0–34.0)
MCHC: 33.8 g/dL (ref 30.0–36.0)
MCV: 86.4 fL (ref 78.0–100.0)
Platelets: 313 10*3/uL (ref 150–400)
RBC: 3.01 MIL/uL — ABNORMAL LOW (ref 4.22–5.81)
RDW: 18.6 % — ABNORMAL HIGH (ref 11.5–15.5)
WBC: 22 10*3/uL — ABNORMAL HIGH (ref 4.0–10.5)

## 2017-05-29 LAB — MAGNESIUM: Magnesium: 1.7 mg/dL (ref 1.7–2.4)

## 2017-05-29 LAB — RENAL FUNCTION PANEL
ANION GAP: 10 (ref 5–15)
Albumin: 1.5 g/dL — ABNORMAL LOW (ref 3.5–5.0)
BUN: 28 mg/dL — ABNORMAL HIGH (ref 6–20)
CHLORIDE: 105 mmol/L (ref 101–111)
CO2: 23 mmol/L (ref 22–32)
Calcium: 7.7 mg/dL — ABNORMAL LOW (ref 8.9–10.3)
Creatinine, Ser: 1.19 mg/dL (ref 0.61–1.24)
GFR calc non Af Amer: >60 mL/min (ref >60)
Glucose, Bld: 144 mg/dL — ABNORMAL HIGH (ref 65–99)
Phosphorus: 3 mg/dL (ref 2.5–4.6)
Potassium: 4 mmol/L (ref 3.5–5.1)
Sodium: 138 mmol/L (ref 135–145)

## 2017-05-29 LAB — GLUCOSE, CAPILLARY
GLUCOSE-CAPILLARY: 131 mg/dL — AB (ref 65–99)
GLUCOSE-CAPILLARY: 106 mg/dL — AB (ref 65–99)
GLUCOSE-CAPILLARY: 130 mg/dL — AB (ref 65–99)
Glucose-Capillary: 144 mg/dL — ABNORMAL HIGH (ref 65–99)

## 2017-05-29 LAB — HEPARIN LEVEL (UNFRACTIONATED)
Heparin Unfractionated: 0.73 IU/mL — ABNORMAL HIGH (ref 0.30–0.70)
Heparin Unfractionated: 0.66 IU/mL (ref 0.30–0.70)

## 2017-05-29 MED ORDER — INSULIN ASPART 100 UNIT/ML ~~LOC~~ SOLN: 2.0000 [IU] | Freq: Four times a day (QID) | SUBCUTANEOUS | Status: DC

## 2017-05-29 NOTE — Progress Notes (Signed)
PULMONARY MEDICINE follow up   Name: Joseph Downs MRN: [id number] DOB: 1951/02/25    ADMISSION DATE:  05/01/2017 CONSULTATION DATE:  05/01/17 REFERRING MD:  Dr. Deretha EmoryZackowski CHIEF COMPLAINT:  Found down  HISTORY OF PRESENT ILLNESS:   67 year old homeless man found down/13 with hypothermia, shock lactic acidosis. Found to have bilateral DVTs and evidence of RV strain on echo He had Acinetobacter in blood and staph in urine with elevated PSA. Unable to be weaned from vent, trach placed 2/27, plans to place PEG 2/28  STUDIES:  ECHO 2/14 - EF 65-70%, G1DD, RV strain Echo 2/21 - EF 55-60%, RV improved  CULTURES: MRSA PCR negative Ucx + staph lugdenensis Blood cx 2/13 >> Diphtheroids + Acinetobacter lwoffii Blood cx 2/19 >>no growth x 5 days Blood cx 2/20 >> no growth x 5 days  ANTIBIOTICS: Zosyn 2/13> 2/18 Vanc 2/13 >>2/19 Meropenem 2/18 >>  SIGNIFICANT EVENTS: 2/13 admitted, intubated 2/22 IVC filter placed 2/25 1u PRBC 2/27 Trach  LINES/TUBES: ETT 2/13 >> 2/27 Trach 2/27>>> Foley 2/13>>> PEG 2/28>>> Right IJ HD catheter 2/15 >>out  Left IJ CVC 2/15 >>out  SUBJECTIVE:  Tolerated TC and trach change on 3/12 without difficulty with no events overnight  Today's Vitals   05/29/17 0306 05/29/17 0317 05/29/17 0749 05/29/17 0750  BP: (!) 108/59  (!) 157/77 (!) 157/77  Pulse: 88  94   Resp:      Temp: 99.4 F (37.4 C)   100.3 F (37.9 C)  TempSrc: Oral   Axillary  SpO2: 100% 99%    Weight: 165 lb 5.5 oz (75 kg)     Height:      PainSc: Asleep   0-No pain      Intake/Output Summary (Last 24 hours) at 05/29/2017 1027 Last data filed at 05/29/2017 1000 Gross per 24 hour  Intake 2527.75 ml  Output 2350 ml  Net 177.75 ml    PHYSICAL EXAMINATION: General: Chronically ill appearing male, tolerating TC without difficulty HEENT: Cuffless 6 in place with sutures out, [place]/AT, PERRL, EOM-I and MMM Pulmonary: Coarse BS diffusely Cardiac: RRR, Nl S1/S2 and  -M/R/G Abdomen: Soft, NT, ND and +BS Neuro/psych: Opens eyes Extremities/musculoskeletal: bilateral AKA- bandaged  LABS:  BMP Latest Ref Rng & Units 05/29/2017 05/28/2017 05/27/2017  Glucose 65 - 99 mg/dL 409(W144(H) 119(J125(H) 478(G159(H)  BUN 6 - 20 mg/dL 95(A28(H) 21(H34(H) 08(M31(H)  Creatinine 0.61 - 1.24 mg/dL 5.781.19 4.69(G1.41(H) 2.95(M1.38(H)  Sodium 135 - 145 mmol/L 138 139 136  Potassium 3.5 - 5.1 mmol/L 4.0 4.2 4.0  Chloride 101 - 111 mmol/L 105 105 104  CO2 22 - 32 mmol/L 23 24 22   Calcium 8.9 - 10.3 mg/dL 7.7(L) 7.7(L) 6.9(L)     05/17/17 cxr I reviewed CXR myself, trach is in good position  ASSESSMENT / PLAN: Acute respiratory failure on ventilator- trach 2/27 Likely pulmonary embolism with associated right heart strain Toxic metabolic encephalopathy Agitation Stress related non-ST elevation MI, suspect right heart strain Acute Cor pulmonale Acute non-oliguric renal failure; creatinine is improved some Rhabdomyolysis Fluid and electrolyte imbalance: Hypernatremia, hypokalemia, metabolic acidosis  Acinetobacter bacteremia. Bilateral DVTs S/p IVC filter Anemia; transfused on 3/1 Elevated PSA, Staph lugdunensis both suggestive of possible prostatic CA  hyperglycemia Bilateral feet with evidence for skin sloughing UTI  S/p Bilateral AKA 05/24/17  Respiratory failure:  - Monitor closely for airway protection  Hypoxemia:  - Titrate O2 for sat of 88-92%, down to humidified air  Trach status:  - Maintain on cuffless 6  - No  decannulation given neuro and physical condition  May go to trach SNF and f/u in the trach clinic  PCCM will sign off, please call back if needed  Alyson Reedy, M.D. [facility]. Pager: [phone]. After hours pager: [phone].

## 2017-05-29 NOTE — Progress Notes (Signed)
ANTICOAGULATION CONSULT NOTE - Follow Up Consult  Pharmacy Consult for Heparin Indication: pulmonary embolus and DVT  No Known Allergies  Patient Measurements: Height: 5\' 10"  (177.8 cm) Weight: 165 lb 5.5 oz (75 kg) IBW/kg (Calculated) : 73 Heparin Dosing Weight: 75 kg  Vital Signs: Temp: 98.3 F (36.8 C) (03/13 1258) Temp Source: Axillary (03/13 1258) BP: 103/66 (03/13 1258) Pulse Rate: 94 (03/13 0749)  Labs: Recent Labs    05/27/17 0202  05/28/17 0228 05/28/17 1710 05/29/17 0250 05/29/17 1157  HGB 7.6*   < > 9.8* 10.0* 8.8*  --   HCT 23.1*   < > 29.4* 29.1* 26.0*  --   PLT 279  --  297 327 313  --   HEPARINUNFRC 0.11*  --  <0.10* 0.71* 0.73* 0.66  CREATININE 1.38*  1.34*  --  1.41*  --  1.19  --    < > = values in this interval not displayed.    Estimated Creatinine Clearance: 63 mL/min (by C-G formula based on SCr of 1.19 mg/dL).  Assessment:   Continues on IV heparin for bilateral DVTs and presumed massive PE. IVC filter placed 2/22.    Heparin level is therapeutic (0.66) on 1550 units/hr, after decrease in rate early this am when level was just above target range (0.73) on 1700 units/hr.  Hgb 10.0>8.8, no bleeding noted.  Platelet count 313.      Day # 3 Zosyn for Pseudomonas UTI.  Dose remains appropriate.   Tmax 100.3, WBC 25.9>22.0.  Scr 1.41>1.19.  Goal of Therapy:  Heparin level 0.3-0.7 units/ml Monitor platelets by anticoagulation protocol: Yes Appropriate Zosyn dose for renal function and infection  Plan:   Continue heparin drip at 1550 units/hr.  Daily heparin level and CBC while on heparin.  Monitor for any s/sx bleeding.  Follow up for transition to Eliquis when able.  Continue Zosyn 3.375 gm IV q8hrs (each over 4 hours)  Follow up Zosyn length of therapy.  Dennie Fettersgan, Ahnika Hannibal [place], [place] Pager: [phone] 05/29/2017,2:54 PM

## 2017-05-29 NOTE — Progress Notes (Signed)
ANTICOAGULATION CONSULT NOTE  Pharmacy Consult for Heparin Indication: DVT/PE  No Known Allergies  Patient Measurements: Height: 5\' 10"  (177.8 cm) Weight: 165 lb 5.5 oz (75 kg) IBW/kg (Calculated) : 73 Heparin Dosing Weight:  72.6 kg  Vital Signs: Temp: 99.4 F (37.4 C) (03/13 0306) Temp Source: Oral (03/13 0306) BP: 108/59 (03/13 0306) Pulse Rate: 88 (03/13 0306)  Labs: Recent Labs    05/26/17 0950  05/27/17 0202  05/28/17 0228 05/28/17 1710 05/29/17 0250  HGB  --   --  7.6*   < > 9.8* 10.0* 8.8*  HCT  --   --  23.1*   < > 29.4* 29.1* 26.0*  PLT  --    < > 279  --  297 327 313  HEPARINUNFRC  --    < > 0.11*  --  <0.10* 0.71* 0.73*  CREATININE 1.37*  --  1.38*  1.34*  --  1.41*  --   --    < > = values in this interval not displayed.   Estimated Creatinine Clearance: 53.2 mL/min (A) (by C-G formula based on SCr of 1.41 mg/dL (H)).  Assessment:  Anticoag: Heparin for new bilateral DVTs/possible PE, heparin level remains elevated despite rate decrease, Hgb down some again this AM, no overt bleeding noted per RN.   Goal of Therapy:  Heparin level 0.3-0.7 units/ml Monitor platelets by anticoagulation protocol: Yes   Plan:  Reduce heparin gtt to 1550 units/hr  8 hour heparin level  Cont to trend Hgb  Abran DukeJames Jonus Coble, PharmD, BCPS Clinical Pharmacist Phone: [phone]

## 2017-05-29 NOTE — Progress Notes (Signed)
PROGRESS NOTE    Joseph Downs  YBO:[id number] DOB: 19-Jun-1950 DOA: 05/01/2017 PCP: Patient, No Pcp Per     Brief Narrative:  Joseph Downs is a 67 yo M, reportedly homeless, no known past medical history other than obstructive uropathy requiring foley. Evidently, on 2/13, he was found down in the cold. He was brought to the ER, hypotensive, had lactate >7 despite 30 cc/kg IVF and re-warming. He was admitted to ICU, at that time was mentating enough to explain that he had passed out, not clear why. That night, he developed respiratory distress requiring intubation. He was treated with broad spectrum antibiotics for septic shock with hypotension, tachycardia and leukocytosis. He had renal failure, creatinine up to 7 and was started on CVVHD. Blood cultures grew Acineto lwoffii, urine cx frew staph lugdunensis. Echo showed severe RV strain and bilateral LE dopplers showed extensive DVT. CTA was not possible, nor catheterization/lysis due to AKI. He was started on heparin drip. At time of transfer out of ICU to [facility] service, patient has tolerated 2 days intermittent trach collar, back on vent at night. He has PEG. Cardiology, Nephrology, Vascular surgery, and Pulm are following. He is s/p B/L AKA due to frostbite.   3/13: No acute events last 24 hours. Patient currently on trach collar and appears comfortable.   Assessment & Plan:   Active Problems:   Lactic acidemia   Acute respiratory failure (HCC)   Exposure to weather condition   Hypotension   Non-ST elevation (NSTEMI) myocardial infarction (HCC)   Malnutrition of moderate degree   Acute cystitis with hematuria   Sepsis ([place])   Acute deep vein thrombosis (DVT) of proximal vein of both lower extremities (HCC)   Pressure injury of skin   Cardiogenic shock (HCC)   Acute cor pulmonale (HCC)   Acute respiratory failure with hypoxemia (HCC)   Deep vein thrombosis (DVT) of non-extremity vein   SIRS (systemic inflammatory response  syndrome) (HCC)   S/P AKA (above knee amputation) bilateral (HCC)   Hypernatremia   Acute blood loss anemia   Encephalopathy   Postoperative pain   Pulmonary embolus (HCC)   Tracheostomy status (HCC)   Sepsis secondary to Pseudomonas UTI -Overnight on 3/8-11/2017, patient developed fever, leukocytosis, tachycardia, tachypnea -CXR reviewed and unremarkable for infection -UA: Few bacteria, 0-5 WBC, positive nitrates, trace leukocytes -Urine culture positive for pseudomonas, sensitive to cephalosporin, cipro, zosyn -Blood cultures from 05/25/2017 show no growth to date -Continue zosyn  -WBC trending down   Acute blood loss anemia/Normocytic Anemia -Unclear etiology -S/p bilateral AKA on 05/24/2017 -Hemoglobin dropped from 9.4 to 7.7 on 05/26/2017, however hemoglobin was 7.6 afterwards -Transfused 2u PRBC on 05/27/2017, hemoglobin today 8.8  -No sign of bleeding, BP stable -Monitor Hgb   Acute respiratory failure -PCCM follows as patient has trach collar -FEES done on 05/27/2017 and patient was placed on dysphagia 1 diet -Currently on cuffless 6, no decannulation given neurologic deficit. May discharge to trach SNF and follow up in trach clinic   Presumed massive pulmonary embolism/Bilateral DVT -Status post IVC filter, placed due to doubts of patient's future compliance with oral anticoagulation  -Remains on heparin gtt. If Hgb remains stable, plan to transition to Eliquis (Dr. Ree Kida discussed with Vascular surgery)   Acute kidney injury -History of recent obstructive uropathy. ?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/urine culture)- needs an outpatient work up -He did have a Foley catheter previous hospitalization -Upon admission, creatinine was 2.5, however peaked at  7.51 -Creatinine improved, currently 1.19  -Nephrology consulted and appreciated and is now signed off -Continue to monitor BMP  Bacteremia -Cultures from 05/01/2017 showed  diphtheroids, Acinetobacter lwoffii -Repeat blood cultures show no growth -Patient was placed on vancomycin and Zosyn however then transitioned to meropenem and completed course  Hypernatremia -Resolved with free water and IVF  Acute metabolic encephalopathy -Avoid benzodiazepines -Continue Haldol  NSTEMI -Cardiology consulted and appreciated, continue aspirin -Possibly related to PE  Presumed frostbite/Gangrene of feet -Vascular surgery consulted and appreciated -Wound care consulted -S/p bilateral AKA 05/24/2017 -PT recommended CIR   DVT prophylaxis: Heparin gtt Code Status: Full Family Communication: No family at bedside Disposition Plan: CIR vs SNF once leukocytosis improves    Consultants:  PCCM Nephrology Cardiology Vascular surgery  Procedures:  Intubated/extubated on 05/01/2017 through 05/15/2017 IVC filter placement 05/10/2017 Trach placed on 05/15/2017 PEG placed 05/16/2017 Bilateral AKA 05/24/2017  Antimicrobials:  Anti-infectives (From admission, onward)   Start     Dose/Rate Route Frequency Ordered Stop   05/27/17 1400  piperacillin-tazobactam (ZOSYN) IVPB 3.375 g     3.375 g 12.5 mL/hr over 240 Minutes Intravenous Every 8 hours 05/27/17 0724     05/27/17 0730  piperacillin-tazobactam (ZOSYN) IVPB 3.375 g     3.375 g 100 mL/hr over 30 Minutes Intravenous  Once 05/27/17 0724 05/27/17 0908   05/25/17 1300  cefTRIAXone (ROCEPHIN) 1 g in sodium chloride 0.9 % 100 mL IVPB  Status:  Discontinued     1 g 200 mL/hr over 30 Minutes Intravenous Every 24 hours 05/25/17 1200 05/27/17 0706   05/24/17 1800  ceFAZolin (ANCEF) IVPB 2g/100 mL premix     2 g 200 mL/hr over 30 Minutes Intravenous Every 8 hours 05/24/17 1300 05/25/17 0238   05/07/17 2200  meropenem (MERREM) 500 mg in sodium chloride 0.9 % 100 mL IVPB     500 mg 200 mL/hr over 30 Minutes Intravenous Every 24 hours 05/07/17 1306 05/19/17 1911   05/07/17 0930  meropenem (MERREM) 1 g in sodium chloride 0.9  % 100 mL IVPB  Status:  Discontinued     1 g 200 mL/hr over 30 Minutes Intravenous Every 12 hours 05/07/17 0908 05/07/17 1306   05/06/17 1300  meropenem (MERREM) 500 mg in sodium chloride 0.9 % 100 mL IVPB  Status:  Discontinued     500 mg 200 mL/hr over 30 Minutes Intravenous Every 24 hours 05/06/17 1146 05/07/17 0908   05/04/17 2200  vancomycin (VANCOCIN) IVPB 1000 mg/200 mL premix     1,000 mg 200 mL/hr over 60 Minutes Intravenous Every 24 hours 05/04/17 0933 05/07/17 2339   05/04/17 1400  piperacillin-tazobactam (ZOSYN) IVPB 3.375 g  Status:  Discontinued     3.375 g 100 mL/hr over 30 Minutes Intravenous Every 6 hours 05/04/17 0932 05/06/17 1146   05/03/17 1300  vancomycin (VANCOCIN) IVPB 1000 mg/200 mL premix  Status:  Discontinued     1,000 mg 200 mL/hr over 60 Minutes Intravenous Every 48 hours 05/02/17 1340 05/04/17 0933   05/02/17 2000  piperacillin-tazobactam (ZOSYN) IVPB 2.25 g  Status:  Discontinued     2.25 g 100 mL/hr over 30 Minutes Intravenous Every 6 hours 05/02/17 1342 05/04/17 0932   05/02/17 1300  vancomycin (VANCOCIN) IVPB 1000 mg/200 mL premix  Status:  Discontinued     1,000 mg 200 mL/hr over 60 Minutes Intravenous Every 24 hours 05/01/17 1313 05/02/17 1340   05/01/17 2000  piperacillin-tazobactam (ZOSYN) IVPB 3.375 g  Status:  Discontinued  3.375 g 12.5 mL/hr over 240 Minutes Intravenous Every 8 hours 05/01/17 1313 05/02/17 1342   05/01/17 1200  vancomycin (VANCOCIN) 1,750 mg in sodium chloride 0.9 % 500 mL IVPB     1,750 mg 250 mL/hr over 120 Minutes Intravenous  Once 05/01/17 1121 05/01/17 1409   05/01/17 1130  piperacillin-tazobactam (ZOSYN) IVPB 3.375 g     3.375 g 100 mL/hr over 30 Minutes Intravenous  Once 05/01/17 1119 05/01/17 1217   05/01/17 1130  vancomycin (VANCOCIN) IVPB 1000 mg/200 mL premix  Status:  Discontinued     1,000 mg 200 mL/hr over 60 Minutes Intravenous  Once 05/01/17 1119 05/01/17 1121       Subjective: Awake to voice, does not  verbalize   Objective: Vitals:   05/29/17 0306 05/29/17 0317 05/29/17 0749 05/29/17 0750  BP: (!) 108/59  (!) 157/77 (!) 157/77  Pulse: 88  94   Resp:      Temp: 99.4 F (37.4 C)   100.3 F (37.9 C)  TempSrc: Oral   Axillary  SpO2: 100% 99%    Weight: 75 kg (165 lb 5.5 oz)     Height:        Intake/Output Summary (Last 24 hours) at 05/29/2017 1116 Last data filed at 05/29/2017 1000 Gross per 24 hour  Intake 2527.75 ml  Output 2350 ml  Net 177.75 ml   Filed Weights   05/27/17 0327 05/28/17 0309 05/29/17 0306  Weight: 74.8 kg (165 lb) 74.8 kg (165 lb) 75 kg (165 lb 5.5 oz)    Examination:  General exam: Appears calm and comfortable  Respiratory system: Respiratory effort normal on trach collar  Cardiovascular system: S1 & S2 heard, tachycardic, regular rhythm. No JVD, murmurs, rubs, gallops or clicks. No pedal edema. Gastrointestinal system: Abdomen is nondistended, soft and nontender. No organomegaly or masses felt. Normal bowel sounds heard. Central nervous system: Alert  Extremities: Bilateral AKA with clean and dry dressing in place   Data Reviewed: I have personally reviewed following labs and imaging studies  CBC: Recent Labs  Lab 05/26/17 0338 05/27/17 0202 05/27/17 0824 05/27/17 2056 05/28/17 0228 05/28/17 1710 05/29/17 0250  WBC 19.0* 22.1*  --   --  23.3* 25.9* 22.0*  HGB 7.7* 7.6* 7.2* 9.6* 9.8* 10.0* 8.8*  HCT 24.0* 23.1* 21.5* 28.3* 29.4* 29.1* 26.0*  MCV 92.7 89.9  --   --  85.2 84.8 86.4  PLT 251 279  --   --  297 327 381   Basic Metabolic Panel: Recent Labs  Lab 05/25/17 0220 05/26/17 0338 05/26/17 0950 05/27/17 0202 05/28/17 0228 05/29/17 0250  NA 147* 140 137 136  137 139 138  K 4.0 3.8 3.6 4.0  4.0 4.2 4.0  CL 113* 109 106 104  105 105 105  CO2 23 21* _0 GLUCOSE 139* 140* 218* 159*  160* 125* 144*  BUN 28* 29* 28* 31*  31* 34* 28*  CREATININE 1.54* 1.55* 1.37* 1.38*  1.34* 1.41* 1.19  CALCIUM 7.2* 7.0* 6.7*  6.9*  7.0* 7.7* 7.7*  PHOS 3.2  --  2.7 3.0 3.4 3.0   GFR: Estimated Creatinine Clearance: 63 mL/min (by C-G formula based on SCr of 1.19 mg/dL). Liver Function Tests: Recent Labs  Lab 05/25/17 0220 05/26/17 0950 05/27/17 0202 05/28/17 0228 05/29/17 0250  ALBUMIN 2.0* 1.6* 1.5* 1.7* 1.5*   No results for input(s): LIPASE, AMYLASE in the last 168 hours. No results for input(s): AMMONIA in the last 168  hours. Coagulation Profile: No results for input(s): INR, PROTIME in the last 168 hours. Cardiac Enzymes: No results for input(s): CKTOTAL, CKMB, CKMBINDEX, TROPONINI in the last 168 hours. BNP (last 3 results) No results for input(s): PROBNP in the last 8760 hours. HbA1C: No results for input(s): HGBA1C in the last 72 hours. CBG: Recent Labs  Lab 05/28/17 1622 05/28/17 1929 05/28/17 2310 05/29/17 0325 05/29/17 0834  GLUCAP 153* 113* 148* 131* 144*   Lipid Profile: No results for input(s): CHOL, HDL, LDLCALC, TRIG, CHOLHDL, LDLDIRECT in the last 72 hours. Thyroid Function Tests: No results for input(s): TSH, T4TOTAL, FREET4, T3FREE, THYROIDAB in the last 72 hours. Anemia Panel: No results for input(s): VITAMINB12, FOLATE, FERRITIN, TIBC, IRON, RETICCTPCT in the last 72 hours. Sepsis Labs: No results for input(s): PROCALCITON, LATICACIDVEN in the last 168 hours.  Recent Results (from the past 240 hour(s))  Urine Culture     Status: Abnormal   Collection Time: 05/25/17 10:10 AM  Result Value Ref Range Status   Specimen Description URINE, CLEAN CATCH  Final   Special Requests   Final    NONE Performed at [facility], [address], [place], [place] [address]    Culture >=100,000 COLONIES/mL PSEUDOMONAS AERUGINOSA (A)  Final   Report Status 05/28/2017 FINAL  Final   Organism ID, Bacteria PSEUDOMONAS AERUGINOSA (A)  Final      Susceptibility   Pseudomonas aeruginosa - MIC*    CEFTAZIDIME 2 SENSITIVE Sensitive     CIPROFLOXACIN <=0.25 SENSITIVE Sensitive      GENTAMICIN <=1 SENSITIVE Sensitive     IMIPENEM >=16 RESISTANT Resistant     PIP/TAZO 8 SENSITIVE Sensitive     CEFEPIME <=1 SENSITIVE Sensitive     * >=100,000 COLONIES/mL PSEUDOMONAS AERUGINOSA  Culture, blood (routine x 2)     Status: None (Preliminary result)   Collection Time: 05/25/17 10:18 AM  Result Value Ref Range Status   Specimen Description BLOOD LEFT HAND  Final   Special Requests   Final    BOTTLES DRAWN AEROBIC AND ANAEROBIC Blood Culture adequate volume   Culture   Final    NO GROWTH 3 DAYS Performed at [facility], [address] [place] [address], [place], [place] [address]    Report Status PENDING  Incomplete  Culture, blood (routine x 2)     Status: None (Preliminary result)   Collection Time: 05/25/17 10:32 AM  Result Value Ref Range Status   Specimen Description BLOOD RIGHT HAND  Final   Special Requests   Final    BOTTLES DRAWN AEROBIC AND ANAEROBIC Blood Culture adequate volume   Culture   Final    NO GROWTH 3 DAYS Performed at [facility], [place] [address], [place],  [address]    Report Status PENDING  Incomplete       Radiology Studies: No results found.    Scheduled Meds: . aspirin  81 mg Per Tube Daily  . chlorhexidine gluconate (MEDLINE KIT)  15 mL Mouth Rinse BID  . collagenase   Topical Daily  . feeding supplement (PRO-STAT SUGAR FREE 64)  30 mL Per Tube BID  . free water  250 mL Per Tube Q8H  . haloperidol  1 mg Oral Daily  . haloperidol  2 mg Oral QHS  . insulin aspart  2-6 Units Subcutaneous Q6H  . mouth rinse  15 mL Mouth Rinse QID  . Melatonin  6 mg Oral QHS  . multivitamin  15 mL Oral Daily  .  pantoprazole sodium  40 mg Per Tube QHS  . sodium chloride flush  10-40 mL Intracatheter Q12H  . thiamine  100 mg Per Tube Daily   Continuous Infusions: . dextrose 75 mL/hr at 05/28/17 1046  . feeding supplement (JEVITY 1.5 CAL/FIBER)    . heparin 1,550 Units/hr (05/29/17 0403)  . lactated ringers Stopped (05/24/17 1900)  . magnesium  sulfate 1 - 4 g bolus IVPB    . piperacillin-tazobactam (ZOSYN)  IV Stopped (05/29/17 0901)     LOS: 28 days    Time spent: 25 minutes   Dessa Phi, DO [facility] [email or web address] Password [facility] 05/29/2017, 11:16 AM

## 2017-05-29 NOTE — Progress Notes (Signed)
  Speech Language Pathology Treatment: [name] Speaking valve  Patient Details Name: Joseph Downs MRN: [id number] DOB: Sep 28, 1950 Today's Date: 05/29/2017 Time: [id number] SLP Time Calculation (min) (ACUTE ONLY): 34 min  Assessment / Plan / Recommendation Clinical Impression  Skilled ST services focused on speech skills. SLP facilitated proper positioning in bed for PMSV trial placement. Pt demonstrated phonation given Max A verbal cues prior to PMSV placement with clear vocal quality, suggesting increased upper airway patency given new cuffless trach. SLP facilitated finger occulusion trials followed by PMSV trials. Pt tolerated over 20 minutes with PMSV in place, O2 and heart rate remained stable, respiratory rate ranged from 22-37 with and without PMSV coinsiding with pt reports of pain. Pt demonstrated phonation with PMSV demonstrating mildly low vocal intensity, although intelligible speech at phrase level. Pt required Max A verbal cues to initiate speech due to cognition. SLP removed PMSV and repositioned pt in bed to provide comfort due to pain, while also notifying RN of pain and PMSV recommendations. SLP recommends PMSV use intermittently with full staff supervision and can be used during meals, however not required due to prior toleration of PO consumption without PMSV in place. Pt was left in room with call bell within reach and bed lowered. Recommend to continue skilled ST services.    HPI HPI: 67 year old homeless man found down/13 with hypothermia, shock lactic acidosis. 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 HehadAcinetobacter in blood and staph in urine with elevated PSA. Unable to be weaned from vent (ETT 2/13-2/27), trach placed 2/27, PEG 2/28.       SLP Plan  Continue with current plan of care       Recommendations  Diet recommendations: Dysphagia 1 (puree);Honey-thick liquid Liquids provided via: Cup Medication Administration: Crushed with  puree Supervision: Patient able to self feed;Staff to assist with self feeding;Full supervision/cueing for compensatory strategies Compensations: Slow rate;Small sips/bites;Minimize environmental distractions;Clear throat intermittently;Lingual sweep for clearance of pocketing Postural Changes and/or Swallow Maneuvers: Seated upright 90 degrees      Patient may use Passy-Muir Speech Valve: During all therapies with supervision;Intermittently with supervision PMSV Supervision: Full MD: Please consider changing trach tube to : Smaller size         Oral Care Recommendations: Oral care BID Follow up Recommendations: Skilled Nursing facility SLP Visit Diagnosis: Aphonia (R49.1) Plan: Continue with current plan of care       GO                Joseph Downs  [facility] 05/29/2017, 2:52 PM

## 2017-05-30 DIAGNOSIS — A4152 Sepsis due to Pseudomonas: Principal | ICD-10-CM

## 2017-05-30 LAB — BASIC METABOLIC PANEL
ANION GAP: 8 (ref 5–15)
BUN: 27 mg/dL — ABNORMAL HIGH (ref 6–20)
CALCIUM: 7.8 mg/dL — AB (ref 8.9–10.3)
CO2: 25 mmol/L (ref 22–32)
Chloride: 106 mmol/L (ref 101–111)
Creatinine, Ser: 1.1 mg/dL (ref 0.61–1.24)
Glucose, Bld: 166 mg/dL — ABNORMAL HIGH (ref 65–99)
POTASSIUM: 4.2 mmol/L (ref 3.5–5.1)
Sodium: 139 mmol/L (ref 135–145)

## 2017-05-30 LAB — CBC
HCT: 25.5 % — ABNORMAL LOW (ref 39.0–52.0)
Hemoglobin: 8.3 g/dL — ABNORMAL LOW (ref 13.0–17.0)
MCH: 28.5 pg (ref 26.0–34.0)
MCHC: 32.5 g/dL (ref 30.0–36.0)
MCV: 87.6 fL (ref 78.0–100.0)
Platelets: 333 10*3/uL (ref 150–400)
RBC: 2.91 MIL/uL — AB (ref 4.22–5.81)
RDW: 18 % — AB (ref 11.5–15.5)
WBC: 25.7 10*3/uL — AB (ref 4.0–10.5)

## 2017-05-30 LAB — CULTURE, BLOOD (ROUTINE X 2)
CULTURE: NO GROWTH
Culture: NO GROWTH
SPECIAL REQUESTS: ADEQUATE
SPECIAL REQUESTS: ADEQUATE

## 2017-05-30 LAB — HEPARIN LEVEL (UNFRACTIONATED): HEPARIN UNFRACTIONATED: 0.55 [IU]/mL (ref 0.30–0.70)

## 2017-05-30 NOTE — Progress Notes (Signed)
  Speech Language Pathology Treatment: Dysphagia;Passy Muir Speaking valve  Patient Details Name: Joseph FloridaLarry E Bernick MRN: [id number] DOB: 1951/01/14 Today's Date: 05/30/2017 Time: [id number] SLP Time Calculation (min) (ACUTE ONLY): 15 min  Assessment / Plan / Recommendation Clinical Impression  Pt now with uncuffed trach and able to efficiently move air to upper airway without back pressure present. Vocal quality and respiratory support good today however responded in one word responses. Vital signs stable.   No s/s aspiration with honey thick liquids; verbal/tactile assist needed to slow rate. Recommend continue Dys 1 texture, honey thick liquids, donn speaking valve although not required as swallow was assessed without valve.    HPI HPI: 67 year old homeless man found down/13 with hypothermia, shock lactic acidosis. 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 HehadAcinetobacter in blood and staph in urine with elevated PSA. Unable to be weaned from vent (ETT 2/13-2/27), trach placed 2/27, PEG 2/28.       SLP Plan  Continue with current plan of care       Recommendations  Diet recommendations: Dysphagia 1 (puree);Honey-thick liquid Liquids provided via: Cup Medication Administration: Crushed with puree Supervision: Patient able to self feed;Staff to assist with self feeding;Full supervision/cueing for compensatory strategies Compensations: Slow rate;Small sips/bites;Minimize environmental distractions;Clear throat intermittently;Lingual sweep for clearance of pocketing Postural Changes and/or Swallow Maneuvers: Seated upright 90 degrees      Patient may use Passy-Muir Speech Valve: During all therapies with supervision;Intermittently with supervision PMSV Supervision: Full         Oral Care Recommendations: Oral care BID Follow up Recommendations: Skilled Nursing facility SLP Visit Diagnosis: Aphonia (R49.1);Dysphagia, unspecified (R13.10) Plan: Continue with  current plan of care                       [name], Shermika Balthaser Willis 05/30/2017, 11:54 AM   Breck CoonsLisa Willis Lonell FaceLitaker M.Ed [employer]CCC-SLP Pager [phone]

## 2017-05-30 NOTE — Progress Notes (Signed)
OT Cancellation Note  Patient Details Name: Joseph Downs MRN: [id number] DOB: Feb 25, 1951   Cancelled Treatment:    Reason Eval/Treat Not Completed: Medical issues which prohibited therapy. Pt with significant draining/blood from R AKA. Spoke with RN who has paged MD. Therapy will hold off until tomorrow and Pt is more appropriate.  Evern BioLaura J Jaydrien Wassenaar 05/30/2017, 11:04 AM  Sherryl MangesLaura Wolfe Camarena OTR/L [phone]

## 2017-05-30 NOTE — Progress Notes (Signed)
Post fall note. RN and NT entered pt room to find patient sitting on floor on floor mat beside bed. No apparent signs of injury. Patient placed back in bed with lift. Vital signs stable. Neuro assessment unchanged. Pt cleaned up and dressing on sacrum and R AKA changed. Alvino Chapelhoi, MD paged and safety sitter ordered. Pt's sister, Eber JonesCarolyn called and updated. Pt on low bed with floor mats prior to fall. Bed alarm on. RN will continue to monitor.

## 2017-05-30 NOTE — Progress Notes (Signed)
PT Cancellation Note  Patient Details Name: Joseph Downs MRN: [id number] DOB: [birth year]   Cancelled Treatment:    Reason Eval/Treat Not Completed: Patient not medically ready. Pt with significant draining/blood from R AKA incision. Therapy will hold off until tomorrow.  [name], PT, DPT Acute Rehab Services  Pager: [phone]  Malachy ChamberJaclyn L Adelfo Diebel 05/30/2017, 11:47 AM

## 2017-05-30 NOTE — Progress Notes (Signed)
Pt seen by trach team for consult. No education needed at this time. All necessary equipment available at bedside. Will continue to monitor for progress. 

## 2017-05-30 NOTE — Progress Notes (Signed)
Inpatient Rehabilitation  Discussed post acute rehab plans for patient with his sister, who states that she anticipates her brother needing long term care and that her home is not handicap accessible.  Given that patient is homeless with limited discharge supports as well as limited therapy tolerance continue to recommend SNF for post acute rehab.  Will sign off.   Charlane FerrettiMelissa Christinamarie Tall, M.A., CCC/SLP Admission Coordinator  [facility]  Cell [phone]

## 2017-05-30 NOTE — Progress Notes (Signed)
PROGRESS NOTE    Joseph Downs  HYW:[id number] DOB: Sep 21, 1950 DOA: 05/01/2017 PCP: Patient, No Pcp Per     Brief Narrative:  Joseph Downs is a 67 yo M, reportedly homeless, no known past medical history other than obstructive uropathy requiring foley. Evidently, on 2/13, he was found down in the cold. He was brought to the ER, hypotensive, had lactate >7 despite 30 cc/kg IVF and re-warming. He was admitted to ICU, at that time was mentating enough to explain that he had passed out, not clear why. That night, he developed respiratory distress requiring intubation. He was treated with broad spectrum antibiotics for septic shock with hypotension, tachycardia and leukocytosis. He had renal failure, creatinine up to 7 and was started on CVVHD. Blood cultures grew Acineto lwoffii, urine cx frew staph lugdunensis. Echo showed severe RV strain and bilateral LE dopplers showed extensive DVT. CTA was not possible, nor catheterization/lysis due to AKI. He was started on heparin drip. At time of transfer out of ICU to [facility] service, patient has tolerated 2 days intermittent trach collar, back on vent at night. He has PEG. Cardiology, Nephrology, Vascular surgery, and Pulm are following. He is s/p B/L AKA due to frostbite.   3/14: Drainage noted from both bilateral AKA sites. No other acute events overnight. Patient remains on trach collar.   Assessment & Plan:   Active Problems:   Lactic acidemia   Acute respiratory failure (HCC)   Exposure to weather condition   Hypotension   Non-ST elevation (NSTEMI) myocardial infarction (HCC)   Malnutrition of moderate degree   Acute cystitis with hematuria   Sepsis ([place])   Acute deep vein thrombosis (DVT) of proximal vein of both lower extremities (HCC)   Pressure injury of skin   Cardiogenic shock (HCC)   Acute cor pulmonale (HCC)   Acute respiratory failure with hypoxemia (HCC)   Deep vein thrombosis (DVT) of non-extremity vein   SIRS (systemic  inflammatory response syndrome) (HCC)   S/P AKA (above knee amputation) bilateral (HCC)   Hypernatremia   Acute blood loss anemia   Encephalopathy   Postoperative pain   Pulmonary embolus (HCC)   Tracheostomy status (HCC)   Sepsis secondary to Pseudomonas UTI -Overnight on 3/8-11/2017, patient developed fever, leukocytosis, tachycardia, tachypnea -CXR reviewed and unremarkable for infection -UA: Few bacteria, 0-5 WBC, positive nitrates, trace leukocytes -Urine culture positive for pseudomonas, sensitive to cephalosporin, cipro, zosyn -Blood cultures from 05/25/2017 show no growth to date -Continue zosyn  -WBC still elevated without fever overnight  Acute blood loss anemia/Normocytic Anemia -S/p bilateral AKA on 05/24/2017 -Hemoglobin dropped from 9.4 to 7.7 on 05/26/2017, however hemoglobin was 7.6 afterwards -Transfused 2u PRBC on 05/27/2017, hemoglobin today 8.3  -Some bleeding around the incision site/dressing of bilateral AKA  -Monitor Hgb   Acute respiratory failure -PCCM follows as patient has trach collar -FEES done on 05/27/2017 and patient was placed on dysphagia 1 diet -Currently on cuffless 6, no decannulation given neurologic deficit. May discharge to trach SNF and follow up in trach clinic   Presumed massive pulmonary embolism/Bilateral DVT -Status post IVC filter, placed due to doubts of patient's future compliance with oral anticoagulation  -Remains on heparin gtt. If Hgb remains stable, plan to transition to Eliquis (Dr. Ree Kida discussed with Vascular surgery). Will continue to monitor Hgb as Hgb 10 --> 8.8 --> 8.3 and now some bleeding around incision site.   Acute kidney injury -History of recent obstructive uropathy. ?Prostate cancer in the setting of staphylococcus  lugdunensis (found on UA/urine culture)- needs an outpatient work up -He did have a Foley catheter previous hospitalization -Upon admission, creatinine was 2.5, however peaked at 7.51 -Creatinine  improved, currently 1.10  -Nephrology consulted and appreciated and is now signed off -Continue to monitor BMP  Bacteremia -Cultures from 05/01/2017 showed diphtheroids, Acinetobacter lwoffii -Repeat blood cultures show no growth -Patient was placed on vancomycin and Zosyn however then transitioned to meropenem and completed course  Hypernatremia -Resolved with free water and IVF  Acute metabolic encephalopathy -Avoid benzodiazepines -Continue Haldol  NSTEMI -Cardiology consulted and appreciated, continue aspirin -Possibly related to PE  Presumed frostbite/Gangrene of feet -Vascular surgery consulted and appreciated -Wound care consulted -S/p bilateral AKA 05/24/2017 -PT recommended CIR   DVT prophylaxis: Heparin gtt Code Status: Full Family Communication: No family at bedside Disposition Plan: CIR vs SNF once leukocytosis improves and Hgb remains stable    Consultants:  PCCM Nephrology Cardiology Vascular surgery  Procedures:  Intubated/extubated on 05/01/2017 through 05/15/2017 IVC filter placement 05/10/2017 Trach placed on 05/15/2017 PEG placed 05/16/2017 Bilateral AKA 05/24/2017  Antimicrobials:  Anti-infectives (From admission, onward)   Start     Dose/Rate Route Frequency Ordered Stop   05/27/17 1400  piperacillin-tazobactam (ZOSYN) IVPB 3.375 g     3.375 g 12.5 mL/hr over 240 Minutes Intravenous Every 8 hours 05/27/17 0724     05/27/17 0730  piperacillin-tazobactam (ZOSYN) IVPB 3.375 g     3.375 g 100 mL/hr over 30 Minutes Intravenous  Once 05/27/17 0724 05/27/17 0908   05/25/17 1300  cefTRIAXone (ROCEPHIN) 1 g in sodium chloride 0.9 % 100 mL IVPB  Status:  Discontinued     1 g 200 mL/hr over 30 Minutes Intravenous Every 24 hours 05/25/17 1200 05/27/17 0706   05/24/17 1800  ceFAZolin (ANCEF) IVPB 2g/100 mL premix     2 g 200 mL/hr over 30 Minutes Intravenous Every 8 hours 05/24/17 1300 05/25/17 0238   05/07/17 2200  meropenem (MERREM) 500 mg in sodium  chloride 0.9 % 100 mL IVPB     500 mg 200 mL/hr over 30 Minutes Intravenous Every 24 hours 05/07/17 1306 05/19/17 1911   05/07/17 0930  meropenem (MERREM) 1 g in sodium chloride 0.9 % 100 mL IVPB  Status:  Discontinued     1 g 200 mL/hr over 30 Minutes Intravenous Every 12 hours 05/07/17 0908 05/07/17 1306   05/06/17 1300  meropenem (MERREM) 500 mg in sodium chloride 0.9 % 100 mL IVPB  Status:  Discontinued     500 mg 200 mL/hr over 30 Minutes Intravenous Every 24 hours 05/06/17 1146 05/07/17 0908   05/04/17 2200  vancomycin (VANCOCIN) IVPB 1000 mg/200 mL premix     1,000 mg 200 mL/hr over 60 Minutes Intravenous Every 24 hours 05/04/17 0933 05/07/17 2339   05/04/17 1400  piperacillin-tazobactam (ZOSYN) IVPB 3.375 g  Status:  Discontinued     3.375 g 100 mL/hr over 30 Minutes Intravenous Every 6 hours 05/04/17 0932 05/06/17 1146   05/03/17 1300  vancomycin (VANCOCIN) IVPB 1000 mg/200 mL premix  Status:  Discontinued     1,000 mg 200 mL/hr over 60 Minutes Intravenous Every 48 hours 05/02/17 1340 05/04/17 0933   05/02/17 2000  piperacillin-tazobactam (ZOSYN) IVPB 2.25 g  Status:  Discontinued     2.25 g 100 mL/hr over 30 Minutes Intravenous Every 6 hours 05/02/17 1342 05/04/17 0932   05/02/17 1300  vancomycin (VANCOCIN) IVPB 1000 mg/200 mL premix  Status:  Discontinued     1,000 mg  200 mL/hr over 60 Minutes Intravenous Every 24 hours 05/01/17 1313 05/02/17 1340   05/01/17 2000  piperacillin-tazobactam (ZOSYN) IVPB 3.375 g  Status:  Discontinued     3.375 g 12.5 mL/hr over 240 Minutes Intravenous Every 8 hours 05/01/17 1313 05/02/17 1342   05/01/17 1200  vancomycin (VANCOCIN) 1,750 mg in sodium chloride 0.9 % 500 mL IVPB     1,750 mg 250 mL/hr over 120 Minutes Intravenous  Once 05/01/17 1121 05/01/17 1409   05/01/17 1130  piperacillin-tazobactam (ZOSYN) IVPB 3.375 g     3.375 g 100 mL/hr over 30 Minutes Intravenous  Once 05/01/17 1119 05/01/17 1217   05/01/17 1130  vancomycin (VANCOCIN)  IVPB 1000 mg/200 mL premix  Status:  Discontinued     1,000 mg 200 mL/hr over 60 Minutes Intravenous  Once 05/01/17 1119 05/01/17 1121       Subjective: Awake to voice, non verbal, follows simple commands   Objective: Vitals:   05/30/17 0335 05/30/17 0708 05/30/17 0915 05/30/17 1127  BP:  122/74 122/74 106/62  Pulse:  100 98 79  Resp:  (!) 30 (!) 22 17  Temp:  99.2 F (37.3 C)  98.8 F (37.1 C)  TempSrc:  Oral  Axillary  SpO2: 100% 100% 100% (!) 75%  Weight:      Height:        Intake/Output Summary (Last 24 hours) at 05/30/2017 1140 Last data filed at 05/30/2017 1038 Gross per 24 hour  Intake 2101.79 ml  Output 2450 ml  Net -348.21 ml   Filed Weights   05/28/17 0309 05/29/17 0306 05/30/17 0313  Weight: 74.8 kg (165 lb) 75 kg (165 lb 5.5 oz) 83 kg (183 lb)   Examination: General exam: Appears calm and comfortable  Respiratory system: Clear to auscultation. Respiratory effort normal. On trach collar  Cardiovascular system: S1 & S2 heard, RRR. No JVD, murmurs, rubs, gallops or clicks.  Gastrointestinal system: Abdomen is nondistended, soft and nontender. No organomegaly or masses felt. Normal bowel sounds heard. Central nervous system: Alert to voice, follows simple commands to move upper extremities  Extremities: Bilateral AKA  Skin: +dressing bilaterally with serosanguinous fluid     Data Reviewed: I have personally reviewed following labs and imaging studies  CBC: Recent Labs  Lab 05/27/17 0202  05/27/17 2056 05/28/17 0228 05/28/17 1710 05/29/17 0250 05/30/17 0316  WBC 22.1*  --   --  23.3* 25.9* 22.0* 25.7*  HGB 7.6*   < > 9.6* 9.8* 10.0* 8.8* 8.3*  HCT 23.1*   < > 28.3* 29.4* 29.1* 26.0* 25.5*  MCV 89.9  --   --  85.2 84.8 86.4 87.6  PLT 279  --   --  297 327 313 333   < > = values in this interval not displayed.   Basic Metabolic Panel: Recent Labs  Lab 05/25/17 0220  05/26/17 0950 05/27/17 0202 05/28/17 0228 05/29/17 0250 05/30/17 0316  NA  147*   < > 137 136  137 139 138 139  K 4.0   < > 3.6 4.0  4.0 4.2 4.0 4.2  CL 113*   < > 106 104  105 105 105 106  CO2 23   < > 22 22  22 24 23 25   GLUCOSE 139*   < > 218* 159*  160* 125* 144* 166*  BUN 28*   < > 28* 31*  31* 34* 28* 27*  CREATININE 1.54*   < > 1.37* 1.38*  1.34* 1.41* 1.19 1.10  CALCIUM  7.2*   < > 6.7* 6.9*  7.0* 7.7* 7.7* 7.8*  MG  --   --   --   --   --  1.7  --   PHOS 3.2  --  2.7 3.0 3.4 3.0  --    < > = values in this interval not displayed.   GFR: Estimated Creatinine Clearance: 68.2 mL/min (by C-G formula based on SCr of 1.1 mg/dL). Liver Function Tests: Recent Labs  Lab 05/25/17 0220 05/26/17 0950 05/27/17 0202 05/28/17 0228 05/29/17 0250  ALBUMIN 2.0* 1.6* 1.5* 1.7* 1.5*   No results for input(s): LIPASE, AMYLASE in the last 168 hours. No results for input(s): AMMONIA in the last 168 hours. Coagulation Profile: No results for input(s): INR, PROTIME in the last 168 hours. Cardiac Enzymes: No results for input(s): CKTOTAL, CKMB, CKMBINDEX, TROPONINI in the last 168 hours. BNP (last 3 results) No results for input(s): PROBNP in the last 8760 hours. HbA1C: No results for input(s): HGBA1C in the last 72 hours. CBG: Recent Labs  Lab 05/28/17 2310 05/29/17 0325 05/29/17 0834 05/29/17 1315 05/29/17 1712  GLUCAP 148* 131* 144* 106* 130*   Lipid Profile: No results for input(s): CHOL, HDL, LDLCALC, TRIG, CHOLHDL, LDLDIRECT in the last 72 hours. Thyroid Function Tests: No results for input(s): TSH, T4TOTAL, FREET4, T3FREE, THYROIDAB in the last 72 hours. Anemia Panel: No results for input(s): VITAMINB12, FOLATE, FERRITIN, TIBC, IRON, RETICCTPCT in the last 72 hours. Sepsis Labs: No results for input(s): PROCALCITON, LATICACIDVEN in the last 168 hours.  Recent Results (from the past 240 hour(s))  Urine Culture     Status: Abnormal   Collection Time: 05/25/17 10:10 AM  Result Value Ref Range Status   Specimen Description URINE, CLEAN  CATCH  Final   Special Requests   Final    NONE Performed at [facility], [address], [place], [place] [address]    Culture >=100,000 COLONIES/mL PSEUDOMONAS AERUGINOSA (A)  Final   Report Status 05/28/2017 FINAL  Final   Organism ID, Bacteria PSEUDOMONAS AERUGINOSA (A)  Final      Susceptibility   Pseudomonas aeruginosa - MIC*    CEFTAZIDIME 2 SENSITIVE Sensitive     CIPROFLOXACIN <=0.25 SENSITIVE Sensitive     GENTAMICIN <=1 SENSITIVE Sensitive     IMIPENEM >=16 RESISTANT Resistant     PIP/TAZO 8 SENSITIVE Sensitive     CEFEPIME <=1 SENSITIVE Sensitive     * >=100,000 COLONIES/mL PSEUDOMONAS AERUGINOSA  Culture, blood (routine x 2)     Status: None (Preliminary result)   Collection Time: 05/25/17 10:18 AM  Result Value Ref Range Status   Specimen Description BLOOD LEFT HAND  Final   Special Requests   Final    BOTTLES DRAWN AEROBIC AND ANAEROBIC Blood Culture adequate volume   Culture   Final    NO GROWTH 4 DAYS Performed at [facility], [address], [place], [place] [address]    Report Status PENDING  Incomplete  Culture, blood (routine x 2)     Status: None (Preliminary result)   Collection Time: 05/25/17 10:32 AM  Result Value Ref Range Status   Specimen Description BLOOD RIGHT HAND  Final   Special Requests   Final    BOTTLES DRAWN AEROBIC AND ANAEROBIC Blood Culture adequate volume   Culture   Final    NO GROWTH 4 DAYS Performed at [facility], [place] [address], [place], [place] [address]    Report Status PENDING  Incomplete       Radiology Studies: No results found.    Scheduled Meds: . aspirin  81 mg Per Tube Daily  . chlorhexidine gluconate (MEDLINE KIT)  15 mL Mouth Rinse BID  . collagenase   Topical Daily  . feeding supplement (PRO-STAT SUGAR FREE 64)  30 mL Per Tube BID  . free water  250 mL Per Tube Q8H  . haloperidol  1 mg Oral Daily  . haloperidol  2 mg Oral QHS  . mouth rinse  15 mL Mouth Rinse QID  . Melatonin  6 mg  Oral QHS  . multivitamin  15 mL Oral Daily  . pantoprazole sodium  40 mg Per Tube QHS  . sodium chloride flush  10-40 mL Intracatheter Q12H  . thiamine  100 mg Per Tube Daily   Continuous Infusions: . feeding supplement (JEVITY 1.5 CAL/FIBER) 1,000 mL (05/29/17 1444)  . heparin 1,550 Units/hr (05/30/17 0735)  . magnesium sulfate 1 - 4 g bolus IVPB    . piperacillin-tazobactam (ZOSYN)  IV Stopped (05/30/17 1005)     LOS: 29 days    Time spent: 20 minutes   Dessa Phi, DO [facility] [email or web address] Password [email or web address] 05/30/2017, 11:40 AM

## 2017-05-30 NOTE — Progress Notes (Addendum)
ANTICOAGULATION CONSULT NOTE - Follow Up Consult  Pharmacy Consult for Heparin Indication: pulmonary embolus and DVT  No Known Allergies  Patient Measurements: Height: 5\' 10"  (177.8 cm) Weight: 183 lb (83 kg)(low bed/bottom machines taken off the bed) IBW/kg (Calculated) : 73 Heparin Dosing Weight: 75 kg  Vital Signs: Temp: 99.2 F (37.3 C) (03/14 0708) Temp Source: Oral (03/14 0708) BP: 122/74 (03/14 0708) Pulse Rate: 100 (03/14 0708)  Labs: Recent Labs    05/28/17 0228 05/28/17 1710 05/29/17 0250 05/29/17 1157 05/30/17 0316  HGB 9.8* 10.0* 8.8*  --  8.3*  HCT 29.4* 29.1* 26.0*  --  25.5*  PLT 297 327 313  --  333  HEPARINUNFRC <0.10* 0.71* 0.73* 0.66 0.55  CREATININE 1.41*  --  1.19  --  1.10    Estimated Creatinine Clearance: 68.2 mL/min (by C-G formula based on SCr of 1.1 mg/dL).  Assessment:   Continues on IV heparin for bilateral DVTs and presumed massive PE. IVC filter placed 2/22.    Heparin level is therapeutic (0.55) on 1550 units/hr.  Hgb 10.0>8.3, no bleeding noted.  Platelet count 333.    Day # 4 Zosyn for Pseudomonas UTI.  Dose remains appropriate.   Tmax 99.2, WBC 25.7,  Scr 1.41>1.10.  Goal of Therapy:  Heparin level 0.3-0.7 units/ml Monitor platelets by anticoagulation protocol: Yes Appropriate Zosyn dose for renal function and infection  Plan:   Continue heparin drip at 1550 units/hr.  Daily heparin level and CBC while on heparin.  Monitor for any s/sx bleeding.  Follow up for transition to Eliquis when able.  Continue Zosyn 3.375 gm IV q8hrs (each over 4 hours)  Follow up Zosyn length of therapy.  Nadara MustardNita Trinady Milewski, PharmD., MS Clinical Pharmacist Pager:  [phone] Thank you for allowing pharmacy to be part of this patients care team. 05/30/2017,7:55 AM

## 2017-05-30 NOTE — Progress Notes (Signed)
   VASCULAR SURGERY ASSESSMENT & PLAN:   6 Days Post-Op s/p: Bilat AKA's.  Nurse reports a small amount of oozing on the dressings.  We will keep an eye on this.   SUBJECTIVE:   No real change in mental status.  PHYSICAL EXAM:   Vitals:   05/29/17 2347 05/30/17 0313 05/30/17 0335 05/30/17 0708  BP:  123/71  122/74  Pulse:  91  100  Resp:  17  (!) 30  Temp:  99.6 F (37.6 C)  99.2 F (37.3 C)  TempSrc:  Oral  Oral  SpO2: 100% 100% 100% 100%  Weight:  183 lb (83 kg)    Height:       Currently dressings are dry.  LABS:   Lab Results  Component Value Date   WBC 25.7 (H) 05/30/2017   HGB 8.3 (L) 05/30/2017   HCT 25.5 (L) 05/30/2017   MCV 87.6 05/30/2017   PLT 333 05/30/2017   Lab Results  Component Value Date   CREATININE 1.10 05/30/2017   Lab Results  Component Value Date   INR 1.21 05/10/2017   CBG (last 3)  Recent Labs    05/29/17 0834 05/29/17 1315 05/29/17 1712  GLUCAP 144* 106* 130*    PROBLEM LIST:    Active Problems:   Lactic acidemia   Acute respiratory failure (HCC)   Exposure to weather condition   Hypotension   Non-ST elevation (NSTEMI) myocardial infarction (HCC)   Malnutrition of moderate degree   Acute cystitis with hematuria   Sepsis (HCC)   Acute deep vein thrombosis (DVT) of proximal vein of both lower extremities (HCC)   Pressure injury of skin   Cardiogenic shock (HCC)   Acute cor pulmonale (HCC)   Acute respiratory failure with hypoxemia (HCC)   Deep vein thrombosis (DVT) of non-extremity vein   SIRS (systemic inflammatory response syndrome) (HCC)   S/P AKA (above knee amputation) bilateral (HCC)   Hypernatremia   Acute blood loss anemia   Encephalopathy   Postoperative pain   Pulmonary embolus (HCC)   Tracheostomy status (HCC)   CURRENT MEDS:   . aspirin  81 mg Per Tube Daily  . chlorhexidine gluconate (MEDLINE KIT)  15 mL Mouth Rinse BID  . collagenase   Topical Daily  . feeding supplement (PRO-STAT SUGAR FREE  64)  30 mL Per Tube BID  . free water  250 mL Per Tube Q8H  . haloperidol  1 mg Oral Daily  . haloperidol  2 mg Oral QHS  . mouth rinse  15 mL Mouth Rinse QID  . Melatonin  6 mg Oral QHS  . multivitamin  15 mL Oral Daily  . pantoprazole sodium  40 mg Per Tube QHS  . sodium chloride flush  10-40 mL Intracatheter Q12H  . thiamine  100 mg Per Tube Daily    Joseph Downs Beeper: [phone] Office: [phone] 05/30/2017

## 2017-05-30 NOTE — Progress Notes (Signed)
When RN entered room pt found to have moderate amount of bloody drainage from R AKA site. Edilia Boickson, MD paged.

## 2017-05-31 LAB — CBC
HEMATOCRIT: 24.4 % — AB (ref 39.0–52.0)
HEMOGLOBIN: 7.8 g/dL — AB (ref 13.0–17.0)
MCH: 28.6 pg (ref 26.0–34.0)
MCHC: 32 g/dL (ref 30.0–36.0)
MCV: 89.4 fL (ref 78.0–100.0)
Platelets: 379 10*3/uL (ref 150–400)
RBC: 2.73 MIL/uL — ABNORMAL LOW (ref 4.22–5.81)
RDW: 18.1 % — ABNORMAL HIGH (ref 11.5–15.5)
WBC: 24.2 10*3/uL — ABNORMAL HIGH (ref 4.0–10.5)

## 2017-05-31 LAB — HEPARIN LEVEL (UNFRACTIONATED)
HEPARIN UNFRACTIONATED: 0.22 [IU]/mL — AB (ref 0.30–0.70)
Heparin Unfractionated: 0.28 IU/mL — ABNORMAL LOW (ref 0.30–0.70)

## 2017-05-31 LAB — BASIC METABOLIC PANEL
Anion gap: 9 (ref 5–15)
BUN: 26 mg/dL — AB (ref 6–20)
CHLORIDE: 106 mmol/L (ref 101–111)
CO2: 24 mmol/L (ref 22–32)
Calcium: 8 mg/dL — ABNORMAL LOW (ref 8.9–10.3)
Creatinine, Ser: 1.07 mg/dL (ref 0.61–1.24)
GFR calc Af Amer: >60 mL/min (ref >60)
GFR calc non Af Amer: >60 mL/min (ref >60)
GLUCOSE: 142 mg/dL — AB (ref 65–99)
Potassium: 4.1 mmol/L (ref 3.5–5.1)
Sodium: 139 mmol/L (ref 135–145)

## 2017-05-31 MED ORDER — JEVITY 1.5 CAL/FIBER PO LIQD
1000.0000 mL | ORAL | Status: DC
  Administered 2017-05-31 – 2017-06-11 (×12): 1000 mL
  Filled 2017-05-31 (×17): qty 1000

## 2017-05-31 NOTE — Progress Notes (Signed)
PROGRESS NOTE    Joseph Downs  BVQ:[id number] DOB: 1950-08-30 DOA: 05/01/2017 PCP: Patient, No Pcp Per     Brief Narrative:  Joseph Downs is a 67 yo M, reportedly homeless, no known past medical history other than obstructive uropathy requiring foley. Evidently, on 2/13, he was found down in the cold. He was brought to the ER, hypotensive, had lactate >7 despite 30 cc/kg IVF and re-warming. He was admitted to ICU, at that time was mentating enough to explain that he had passed out, not clear why. That night, he developed respiratory distress requiring intubation. He was treated with broad spectrum antibiotics for septic shock with hypotension, tachycardia and leukocytosis. He had renal failure, creatinine up to 7 and was started on CVVHD. Blood cultures grew [place]o lwoffii, urine cx frew staph lugdunensis. Echo showed severe RV strain and bilateral LE dopplers showed extensive DVT. CTA was not possible, nor catheterization/lysis due to AKI. He was started on heparin drip. At time of transfer out of ICU to [facility] service, patient has tolerated 2 days intermittent trach collar, back on vent at night. He has PEG. Cardiology, Nephrology, Vascular surgery, and Pulm are following. He is s/p B/L AKA due to frostbite.   3/15: Patient had an unwitnessed fall yesterday evening. No apparent injury noted by staff at the time. Patient placed back in bed with sitter at bedside. No other acute events overnight.   Assessment & Plan:   Principal Problem:   Sepsis ([place]) Active Problems:   Lactic acidemia   Acute respiratory failure (HCC)   Exposure to weather condition   Hypotension   Non-ST elevation (NSTEMI) myocardial infarction (HCC)   Malnutrition of moderate degree   Acute cystitis with hematuria   Acute deep vein thrombosis (DVT) of proximal vein of both lower extremities (HCC)   Pressure injury of skin   Cardiogenic shock (HCC)   Acute cor pulmonale (HCC)   Acute respiratory failure with  hypoxemia (HCC)   Deep vein thrombosis (DVT) of non-extremity vein   SIRS (systemic inflammatory response syndrome) (HCC)   S/P AKA (above knee amputation) bilateral (HCC)   Hypernatremia   Acute blood loss anemia   Encephalopathy   Postoperative pain   Pulmonary embolus (HCC)   Tracheostomy status (HCC)   Sepsis secondary to Pseudomonas UTI -Overnight on 3/8-11/2017, patient developed fever, leukocytosis, tachycardia, tachypnea -CXR reviewed and unremarkable for infection -UA: Few bacteria, 0-5 WBC, positive nitrates, trace leukocytes -Urine culture positive for pseudomonas, sensitive to cephalosporin, cipro, zosyn -Blood cultures from 05/25/2017 show no growth to date -Continue zosyn  -WBC still elevated without fever overnight, monitor WBC   Acute blood loss anemia/Normocytic Anemia -S/p bilateral AKA on 05/24/2017 -Hemoglobin dropped from 9.4 to 7.7 on 05/26/2017, however hemoglobin was 7.6 afterwards -Transfused 2u PRBC on 05/27/2017, hemoglobin today 7.8 -Monitor Hgb. Remains on heparin gtt   Acute respiratory failure -PCCM follows as patient has trach collar -FEES done on 05/27/2017 and patient was placed on dysphagia 1 diet -Currently on cuffless 6, no decannulation given neurologic deficit. May discharge to trach SNF and follow up in trach clinic   Presumed massive pulmonary embolism/Bilateral DVT -Status post IVC filter, placed due to doubts of patient's future compliance with oral anticoagulation  -Remains on heparin gtt. If Hgb remains stable, plan to transition to Eliquis (Dr. Ree Kida discussed with Vascular surgery). Will continue to monitor Hgb as Hgb 10 --> 8.8 --> 8.3 --> 7.8   Acute kidney injury -History of recent obstructive uropathy. ?Prostate  cancer in the setting of staphylococcus lugdunensis (found on UA/urine culture)- needs an outpatient work up -He did have a Foley catheter previous hospitalization -Upon admission, creatinine was 2.5, however peaked at  7.51 -Creatinine improved, currently 1.07 -Nephrology consulted and appreciated and is now signed off -Continue to monitor BMP  Bacteremia -Cultures from 05/01/2017 showed diphtheroids, Acinetobacter lwoffii -Repeat blood cultures show no growth -Patient was placed on vancomycin and Zosyn however then transitioned to meropenem and completed course  Hypernatremia -Resolved with free water and IVF  Acute metabolic encephalopathy -Avoid benzodiazepines -Continue Haldol  NSTEMI -Cardiology consulted and appreciated, continue aspirin -Possibly related to PE  Presumed frostbite/Gangrene of feet -Vascular surgery consulted and appreciated -Wound care consulted -S/p bilateral AKA 05/24/2017   DVT prophylaxis: Heparin gtt Code Status: Full Family Communication: No family at bedside, spoke with sister over the phone  Disposition Plan: SNF once leukocytosis improves and Hgb remains stable    Consultants:  PCCM Nephrology Cardiology Vascular surgery  Procedures:  Intubated/extubated on 05/01/2017 through 05/15/2017 IVC filter placement 05/10/2017 Trach placed on 05/15/2017 PEG placed 05/16/2017 Bilateral AKA 05/24/2017  Antimicrobials:  Anti-infectives (From admission, onward)   Start     Dose/Rate Route Frequency Ordered Stop   05/27/17 1400  piperacillin-tazobactam (ZOSYN) IVPB 3.375 g     3.375 g 12.5 mL/hr over 240 Minutes Intravenous Every 8 hours 05/27/17 0724     05/27/17 0730  piperacillin-tazobactam (ZOSYN) IVPB 3.375 g     3.375 g 100 mL/hr over 30 Minutes Intravenous  Once 05/27/17 0724 05/27/17 0908   05/25/17 1300  cefTRIAXone (ROCEPHIN) 1 g in sodium chloride 0.9 % 100 mL IVPB  Status:  Discontinued     1 g 200 mL/hr over 30 Minutes Intravenous Every 24 hours 05/25/17 1200 05/27/17 0706   05/24/17 1800  ceFAZolin (ANCEF) IVPB 2g/100 mL premix     2 g 200 mL/hr over 30 Minutes Intravenous Every 8 hours 05/24/17 1300 05/25/17 0238   05/07/17 2200  meropenem  (MERREM) 500 mg in sodium chloride 0.9 % 100 mL IVPB     500 mg 200 mL/hr over 30 Minutes Intravenous Every 24 hours 05/07/17 1306 05/19/17 1911   05/07/17 0930  meropenem (MERREM) 1 g in sodium chloride 0.9 % 100 mL IVPB  Status:  Discontinued     1 g 200 mL/hr over 30 Minutes Intravenous Every 12 hours 05/07/17 0908 05/07/17 1306   05/06/17 1300  meropenem (MERREM) 500 mg in sodium chloride 0.9 % 100 mL IVPB  Status:  Discontinued     500 mg 200 mL/hr over 30 Minutes Intravenous Every 24 hours 05/06/17 1146 05/07/17 0908   05/04/17 2200  vancomycin (VANCOCIN) IVPB 1000 mg/200 mL premix     1,000 mg 200 mL/hr over 60 Minutes Intravenous Every 24 hours 05/04/17 0933 05/07/17 2339   05/04/17 1400  piperacillin-tazobactam (ZOSYN) IVPB 3.375 g  Status:  Discontinued     3.375 g 100 mL/hr over 30 Minutes Intravenous Every 6 hours 05/04/17 0932 05/06/17 1146   05/03/17 1300  vancomycin (VANCOCIN) IVPB 1000 mg/200 mL premix  Status:  Discontinued     1,000 mg 200 mL/hr over 60 Minutes Intravenous Every 48 hours 05/02/17 1340 05/04/17 0933   05/02/17 2000  piperacillin-tazobactam (ZOSYN) IVPB 2.25 g  Status:  Discontinued     2.25 g 100 mL/hr over 30 Minutes Intravenous Every 6 hours 05/02/17 1342 05/04/17 0932   05/02/17 1300  vancomycin (VANCOCIN) IVPB 1000 mg/200 mL premix  Status:  Discontinued     1,000 mg 200 mL/hr over 60 Minutes Intravenous Every 24 hours 05/01/17 1313 05/02/17 1340   05/01/17 2000  piperacillin-tazobactam (ZOSYN) IVPB 3.375 g  Status:  Discontinued     3.375 g 12.5 mL/hr over 240 Minutes Intravenous Every 8 hours 05/01/17 1313 05/02/17 1342   05/01/17 1200  vancomycin (VANCOCIN) 1,750 mg in sodium chloride 0.9 % 500 mL IVPB     1,750 mg 250 mL/hr over 120 Minutes Intravenous  Once 05/01/17 1121 05/01/17 1409   05/01/17 1130  piperacillin-tazobactam (ZOSYN) IVPB 3.375 g     3.375 g 100 mL/hr over 30 Minutes Intravenous  Once 05/01/17 1119 05/01/17 1217   05/01/17  1130  vancomycin (VANCOCIN) IVPB 1000 mg/200 mL premix  Status:  Discontinued     1,000 mg 200 mL/hr over 60 Minutes Intravenous  Once 05/01/17 1119 05/01/17 1121       Subjective: Patient awake, alert this morning. States he wants water. No complaints of pain. Tells me he is in [place] and that year is 1992   Objective: Vitals:   05/31/17 0025 05/31/17 0300 05/31/17 0340 05/31/17 0730  BP:  (!) 144/70  137/72  Pulse: 98 97 92 93  Resp: 20 (!) 28 20 (!) 30  Temp:  99.2 F (37.3 C)  99.8 F (37.7 C)  TempSrc:  Oral  Oral  SpO2: 99% 99% 98% 100%  Weight:  84.4 kg (186 lb)    Height:        Intake/Output Summary (Last 24 hours) at 05/31/2017 1152 Last data filed at 05/31/2017 0900 Gross per 24 hour  Intake 1970.96 ml  Output 1350 ml  Net 620.96 ml   Filed Weights   05/29/17 0306 05/30/17 0313 05/31/17 0300  Weight: 75 kg (165 lb 5.5 oz) 83 kg (183 lb) 84.4 kg (186 lb)    Examination: General exam: Appears calm and comfortable  Respiratory system: Clear to auscultation. Respiratory effort normal. On trach collar  Cardiovascular system: S1 & S2 heard, RRR. No JVD, murmurs, rubs, gallops or clicks. No pedal edema. Gastrointestinal system: Abdomen is nondistended, soft and nontender. No organomegaly or masses felt. Normal bowel sounds heard. Central nervous system: Alert and answers simple questions, states he wants water, not oriented, states he is in [place], year 1992  Extremities: +Bilateral AKA, dressing dry and clean today    Data Reviewed: I have personally reviewed following labs and imaging studies  CBC: Recent Labs  Lab 05/28/17 0228 05/28/17 1710 05/29/17 0250 05/30/17 0316 05/31/17 0610  WBC 23.3* 25.9* 22.0* 25.7* 24.2*  HGB 9.8* 10.0* 8.8* 8.3* 7.8*  HCT 29.4* 29.1* 26.0* 25.5* 24.4*  MCV 85.2 84.8 86.4 87.6 89.4  PLT 297 327 313 333 754   Basic Metabolic Panel: Recent Labs  Lab 05/25/17 0220  05/26/17 0950 05/27/17 0202  05/28/17 0228 05/29/17 0250 05/30/17 0316 05/31/17 0610  NA 147*   < > 137 136  137 139 138 139 139  K 4.0   < > 3.6 4.0  4.0 4.2 4.0 4.2 4.1  CL 113*   < > 106 104  105 105 105 106 106  CO2 23   < > 22 22  22 24 23 25 24   GLUCOSE 139*   < > 218* 159*  160* 125* 144* 166* 142*  BUN 28*   < > 28* 31*  31* 34* 28* 27* 26*  CREATININE 1.54*   < > 1.37* 1.38*  1.34* 1.41* 1.19 1.10  1.07  CALCIUM 7.2*   < > 6.7* 6.9*  7.0* 7.7* 7.7* 7.8* 8.0*  MG  --   --   --   --   --  1.7  --   --   PHOS 3.2  --  2.7 3.0 3.4 3.0  --   --    < > = values in this interval not displayed.   GFR: Estimated Creatinine Clearance: 70.1 mL/min (by C-G formula based on SCr of 1.07 mg/dL). Liver Function Tests: Recent Labs  Lab 05/25/17 0220 05/26/17 0950 05/27/17 0202 05/28/17 0228 05/29/17 0250  ALBUMIN 2.0* 1.6* 1.5* 1.7* 1.5*   No results for input(s): LIPASE, AMYLASE in the last 168 hours. No results for input(s): AMMONIA in the last 168 hours. Coagulation Profile: No results for input(s): INR, PROTIME in the last 168 hours. Cardiac Enzymes: No results for input(s): CKTOTAL, CKMB, CKMBINDEX, TROPONINI in the last 168 hours. BNP (last 3 results) No results for input(s): PROBNP in the last 8760 hours. HbA1C: No results for input(s): HGBA1C in the last 72 hours. CBG: Recent Labs  Lab 05/28/17 2310 05/29/17 0325 05/29/17 0834 05/29/17 1315 05/29/17 1712  GLUCAP 148* 131* 144* 106* 130*   Lipid Profile: No results for input(s): CHOL, HDL, LDLCALC, TRIG, CHOLHDL, LDLDIRECT in the last 72 hours. Thyroid Function Tests: No results for input(s): TSH, T4TOTAL, FREET4, T3FREE, THYROIDAB in the last 72 hours. Anemia Panel: No results for input(s): VITAMINB12, FOLATE, FERRITIN, TIBC, IRON, RETICCTPCT in the last 72 hours. Sepsis Labs: No results for input(s): PROCALCITON, LATICACIDVEN in the last 168 hours.  Recent Results (from the past 240 hour(s))  Urine Culture     Status: Abnormal    Collection Time: 05/25/17 10:10 AM  Result Value Ref Range Status   Specimen Description URINE, CLEAN CATCH  Final   Special Requests   Final    NONE Performed at [facility], [address], [place], [place] [address]    Culture >=100,000 COLONIES/mL PSEUDOMONAS AERUGINOSA (A)  Final   Report Status 05/28/2017 FINAL  Final   Organism ID, Bacteria PSEUDOMONAS AERUGINOSA (A)  Final      Susceptibility   Pseudomonas aeruginosa - MIC*    CEFTAZIDIME 2 SENSITIVE Sensitive     CIPROFLOXACIN <=0.25 SENSITIVE Sensitive     GENTAMICIN <=1 SENSITIVE Sensitive     IMIPENEM >=16 RESISTANT Resistant     PIP/TAZO 8 SENSITIVE Sensitive     CEFEPIME <=1 SENSITIVE Sensitive     * >=100,000 COLONIES/mL PSEUDOMONAS AERUGINOSA  Culture, blood (routine x 2)     Status: None   Collection Time: 05/25/17 10:18 AM  Result Value Ref Range Status   Specimen Description BLOOD LEFT HAND  Final   Special Requests   Final    BOTTLES DRAWN AEROBIC AND ANAEROBIC Blood Culture adequate volume   Culture   Final    NO GROWTH 5 DAYS Performed at [facility], [address] [place], [place] [address]    Report Status 05/30/2017 FINAL  Final  Culture, blood (routine x 2)     Status: None   Collection Time: 05/25/17 10:32 AM  Result Value Ref Range Status   Specimen Description BLOOD RIGHT HAND  Final   Special Requests   Final    BOTTLES DRAWN AEROBIC AND ANAEROBIC Blood Culture adequate volume   Culture   Final    NO GROWTH 5 DAYS Performed at [facility], [place] [address], [place], [place] [address]  Report Status 05/30/2017 FINAL  Final       Radiology Studies: No results found.    Scheduled Meds: . aspirin  81 mg Per Tube Daily  . chlorhexidine gluconate (MEDLINE KIT)  15 mL Mouth Rinse BID  . collagenase   Topical Daily  . feeding supplement (PRO-STAT SUGAR FREE 64)  30 mL Per Tube BID  . free water  250 mL Per Tube Q8H  . haloperidol  1 mg Oral Daily  . haloperidol   2 mg Oral QHS  . mouth rinse  15 mL Mouth Rinse QID  . Melatonin  6 mg Oral QHS  . multivitamin  15 mL Oral Daily  . pantoprazole sodium  40 mg Per Tube QHS  . sodium chloride flush  10-40 mL Intracatheter Q12H  . thiamine  100 mg Per Tube Daily   Continuous Infusions: . feeding supplement (JEVITY 1.5 CAL/FIBER) 1,000 mL (05/30/17 1421)  . heparin 1,650 Units/hr (05/31/17 0910)  . magnesium sulfate 1 - 4 g bolus IVPB    . piperacillin-tazobactam (ZOSYN)  IV Stopped (05/31/17 0910)     LOS: 30 days    Time spent: 25 minutes   Dessa Phi, DO [facility] [email or web address] Password [facility] 05/31/2017, 11:52 AM

## 2017-05-31 NOTE — Progress Notes (Signed)
   VASCULAR SURGERY ASSESSMENT & PLAN:   7 Days Post-Op s/p: Bilateral AKA's  Currently dressing is dry on right AKA.  Continue packing changes daily.  I will check back on Monday.  If any problems over the weekend Dr. Trula Slade is on call.   SUBJECTIVE:   "Wants breakfast."  PHYSICAL EXAM:   Vitals:   05/30/17 2238 05/31/17 0025 05/31/17 0300 05/31/17 0340  BP: (!) 157/69  (!) 144/70   Pulse: (!) 106 98 97 92  Resp: 19 20 (!) 28 20  Temp: 98.9 F (37.2 C)  99.2 F (37.3 C)   TempSrc: Oral  Oral   SpO2: 99% 99% 99% 98%  Weight:   186 lb (84.4 kg)   Height:       Dressings on both AKA's are dry.  LABS:   Lab Results  Component Value Date   WBC 24.2 (H) 05/31/2017   HGB 7.8 (L) 05/31/2017   HCT 24.4 (L) 05/31/2017   MCV 89.4 05/31/2017   PLT 379 05/31/2017   Lab Results  Component Value Date   CREATININE 1.07 05/31/2017   Lab Results  Component Value Date   INR 1.21 05/10/2017   CBG (last 3)  Recent Labs    05/29/17 0834 05/29/17 1315 05/29/17 1712  GLUCAP 144* 106* 130*    PROBLEM LIST:    Active Problems:   Lactic acidemia   Acute respiratory failure (HCC)   Exposure to weather condition   Hypotension   Non-ST elevation (NSTEMI) myocardial infarction (HCC)   Malnutrition of moderate degree   Acute cystitis with hematuria   Sepsis ([place])   Acute deep vein thrombosis (DVT) of proximal vein of both lower extremities (HCC)   Pressure injury of skin   Cardiogenic shock (HCC)   Acute cor pulmonale (HCC)   Acute respiratory failure with hypoxemia (HCC)   Deep vein thrombosis (DVT) of non-extremity vein   SIRS (systemic inflammatory response syndrome) (HCC)   S/P AKA (above knee amputation) bilateral (HCC)   Hypernatremia   Acute blood loss anemia   Encephalopathy   Postoperative pain   Pulmonary embolus (HCC)   Tracheostomy status (HCC)   CURRENT MEDS:   . aspirin  81 mg Per Tube Daily  . chlorhexidine gluconate (MEDLINE KIT)  15 mL Mouth  Rinse BID  . collagenase   Topical Daily  . feeding supplement (PRO-STAT SUGAR FREE 64)  30 mL Per Tube BID  . free water  250 mL Per Tube Q8H  . haloperidol  1 mg Oral Daily  . haloperidol  2 mg Oral QHS  . mouth rinse  15 mL Mouth Rinse QID  . Melatonin  6 mg Oral QHS  . multivitamin  15 mL Oral Daily  . pantoprazole sodium  40 mg Per Tube QHS  . sodium chloride flush  10-40 mL Intracatheter Q12H  . thiamine  100 mg Per Tube Daily    Deitra Mayo Beeper: [phone] Office: [phone] 05/31/2017

## 2017-05-31 NOTE — Care Management Note (Signed)
Case Management Note  Patient Details  Name: Joseph Downs MRN: [id number] Date of Birth: Dec 10, 1950  Subjective/Objective:    Pt admitted with Hypothermia shock, and bilateral DVTs                Action/Plan:  PTA from home and was found down.  Pt remains on ventilator via ETT tube.  Pt originally on CRRT - now discontinued and daily IHD assessments.  CM will continue to follow for discharge needs   Expected Discharge Date:  05/27/17               Expected Discharge Plan:     In-House Referral:     Discharge planning Services  CM Consult  Post Acute Care Choice:    Choice offered to:     DME Arranged:    DME Agency:     HH Arranged:    HH Agency:     Status of Service:     If discussed at [employer] Meetings, dates discussed:    Additional Comments: 05/31/2017  Discussed in LOS 3/13.  Barrier to discharge; pt remains on IV heparin drip.  CM discussed barrier with pharm - pt remains on IV due to unstable hgb, additionally pt continues to ooze from AKA site.  CSW continues to follow for SNF placement when approrpriate   05/30/2017 Pt remains on TC 28%.  Having fevers and increased WBC count.  Pt remains on IV heparin, tube feeds and IV antibiotics.  CSW continuing to follow for placement when appropriate  05/24/17 Pt is now s/p bilateral BKA.  CSW continuing to follow for discharge to SNF when appropriate [name], [name] S, RN 05/31/2017, 8:58 AM

## 2017-05-31 NOTE — Progress Notes (Addendum)
ANTICOAGULATION CONSULT NOTE - Follow Up Consult  Pharmacy Consult for Heparin Indication: pulmonary embolus and DVT  No Known Allergies  Patient Measurements: Height: 5\' 10"  (177.8 cm) Weight: 186 lb (84.4 kg) IBW/kg (Calculated) : 73 Heparin Dosing Weight: 75 kg  Vital Signs: Temp: 99.8 F (37.7 C) (03/15 0730) Temp Source: Oral (03/15 0730) BP: 137/72 (03/15 0730) Pulse Rate: 93 (03/15 0730)  Labs: Recent Labs    05/29/17 0250 05/29/17 1157 05/30/17 0316 05/31/17 0610  HGB 8.8*  --  8.3* 7.8*  HCT 26.0*  --  25.5* 24.4*  PLT 313  --  333 379  HEPARINUNFRC 0.73* 0.66 0.55 0.28*  CREATININE 1.19  --  1.10 1.07    Estimated Creatinine Clearance: 70.1 mL/min (by C-G formula based on SCr of 1.07 mg/dL).  Assessment: Continues on IV heparin for bilateral DVTs and presumed massive PE. IVC filter placed 2/22.  Heparin level slightly below goal: 0.28 after being therapeutic. Patient lost IV access yesterday evening. New line placed and infusion resumed around midnight. Hgb continues to trend down 8.8>8.3>7.8, RN reports slightly oozing from bilateral AKAs.  Platelet count WNL.  Day # 5 Zosyn for Pseudomonas UTI.  Dose remains appropriate. Tmax 99.2, WBC 24.2,  Scr 1.41>1.10>1.07.  Goal of Therapy:  Heparin level 0.3-0.7 units/ml Monitor platelets by anticoagulation protocol: Yes Appropriate Zosyn dose for renal function and infection  Plan:  Increase heparin drip to 1650 units/hr. Daily heparin level and CBC while on heparin. Monitor for any s/sx bleeding. Follow up for transition to Eliquis when able. Continue Zosyn 3.375 gm IV q8hrs (each over 4 hours) Follow up Zosyn length of therapy.  Ruben Imony Arleny Kruger, PharmD Clinical Pharmacist 05/31/2017 8:38 AM

## 2017-05-31 NOTE — Progress Notes (Addendum)
Nutrition Follow-up  DOCUMENTATION CODES:   Non-severe (moderate) malnutrition in context of social or environmental circumstances  INTERVENTION:   -Transition to nocturnal feedings: Jevity 1.5 @ 55 ml/hr via PEG over 12 hour period  -Continue 30 ml Prostat BID  Tube feeding regimen provides 1090 kcal (54% of needs), 57 grams of protein, and 502 ml of H2O. (Total free water: 1252 ml)  -Continue 15 ml liquid MVI daily via PEG  -Magic Cup TID with meals  NUTRITION DIAGNOSIS:   Moderate Malnutrition related to social / environmental circumstances as evidenced by mild fat depletion, mild muscle depletion.  Ongoing  GOAL:   Patient will meet greater than or equal to 90% of their needs  Met with TF  MONITOR:   Diet advancement, Labs, Weight trends, Skin, I & O's, TF tolerance  REASON FOR ASSESSMENT:   Consult Enteral/tube feeding initiation and management  ASSESSMENT:   67 yo male with no PMH who was admitted on 2/13 after being found unresponsive outside by police with hypothermia, hypotension, elevated lactic acid. Required intubation on admission. Found to have extensive bilateral DVTs.  11/27- s/p trach 2/28- s/p PEG placement 3/6- transferred from ICU to SDU 3/8- s/p bilateral AKA 2/11- s/p FEES- advanced to a dysphagia 1 diet with honey thick liquids  Pt sleeping soundly at time of visit. Remains on trach collar.   SLP continues with follow.  Case discussed with RN, who reports mentation has improved. Documented meal completion 10-25%. Per RN, pt ate about half of his breakfast tray this AM. He prefers to drink instead of consuming solid foods.   Pt remains on TF; Jevity 1.5 infusing via PEG at goal rate of 55 ml/hr. Pt also receiving 30 ml Prostat BID, 15 ml liquid MVI daily, and 250 ml free water flush every 8 hours. Complete regimen providing 2180 kcal (100% of needs), 114 grams of protein, and 1003 ml of H2O (total free water: 1753 ml).  Per RNCM note,  plan for SNF placement.   Labs reviewed: CBGS: 106-130.   Diet Order:  DIET - DYS 1 Room service appropriate? Yes; Fluid consistency: Honey Thick  EDUCATION NEEDS:   No education needs have been identified at this time  Skin:  Skin Assessment: Skin Integrity Issues: Skin Integrity Issues:: Stage II, Unstageable Stage II: worsening- advanced to unstageable Unstageable: sacrum Other: bilateral AKA incisions  Last BM:  05/30/17  Height:   Ht Readings from Last 1 Encounters:  05/14/17 _0  (1.778 m)    Weight:   Wt Readings from Last 1 Encounters:  05/31/17 186 lb (84.4 kg)    Ideal Body Weight:  63.4 kg  BMI:  Body mass index is 26.69 kg/m.  Estimated Nutritional Needs:   Kcal:  2000-2200  Protein:  110-125 grams  Fluid:  >2.0 L    Lizmarie Witters A. Jimmye Norman, RD, LDN, CDE Pager: [phone] After hours Pager: [phone]

## 2017-05-31 NOTE — Progress Notes (Signed)
Physical Therapy Treatment Patient Details Name: Joseph FloridaLarry E Downs MRN: [id number] DOB: 07/17/1950 Today's Date: 05/31/2017    History of Present Illness Pt is a 67 year old man who was found down, unresponsive and hypothermic on 05/01/17. Developed respiratory failure and intubated 2/13. Pt also with encephalopathy, shock, NSTEMI, acute renal failure, bilateral DVTs, massive PE, Bil DVTs, massive PE, bactermeia. IVC filter placed 2/22. Extubated 2/26, but re-intubated later that day. Trach placed 2/27. Peg placed 2/28. CT 3/7 negative for acute intracranial abnormality. 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 No medical history on file.   PT Comments    Pt slowly progressing with mobility. Verbalizing more this session, but continues to require repeated cues to attend to task. Difficulty sequencing with bilateral residual limb therex, requiring repeated multimodal cues to complete. MaxA for UE support to long sit; min-maxA to roll R/L for repositioning in order to offload sacral wound. Easily fatigued, falling asleep by end of session. Discharge recommendations updated for SNF-level therapies at d/c.    Follow Up Recommendations  SNF;Supervision/Assistance - 24 hour     Equipment Recommendations  (TBD next venue)    Recommendations for Other Services       Precautions / Restrictions Precautions Precautions: Fall Precaution Comments: Bilat AKAs, sacral wound, trach, PEG    Mobility  Bed Mobility Overal bed mobility: Needs Assistance Bed Mobility: Rolling;Supine to Sit;Sit to Supine Rolling: Max assist;Min assist   Supine to sit: Max assist     General bed mobility comments: MinA to roll to L side with use of bed rail; maxA to roll onto R side (potential increased effort due to pain from sacral wound?). Able to long sit with maxA and UE support for trunk elevation. Pt able to engage core musculature with bed mobility  Transfers                 General transfer comment: Not attempted  secondary to significant sacral wound (RN aware)  Ambulation/Gait                 Stairs            Wheelchair Mobility    Modified Rankin (Stroke Patients Only)       Balance   Sitting-balance support: Bilateral upper extremity supported;Feet unsupported Sitting balance-Leahy Scale: Poor Sitting balance - Comments: Reliant on external support Postural control: Posterior lean                                  Cognition Arousal/Alertness: Awake/alert Behavior During Therapy: Flat affect Overall Cognitive Status: Impaired/Different from baseline Area of Impairment: Orientation;Attention;Following commands;Safety/judgement;Awareness;Problem solving                 Orientation Level: Disoriented to;Time;Situation;Place Current Attention Level: Sustained   Following Commands: Follows one step commands with increased time;Follows one step commands inconsistently;Follows multi-step commands inconsistently Safety/Judgement: Decreased awareness of deficits;Decreased awareness of safety Awareness: Intellectual Problem Solving: Slow processing;Decreased initiation;Difficulty sequencing;Requires verbal cues General Comments: Pt asking "Hey, does Davon work here?", attempted to reorient that we are at hospital and I do not know this friend, but pt perseverating on this. Follows one step commands 50-75%, sometimes with increased time      Exercises Amputee Exercises Hip Extension: AROM;Both;5 reps;Supine(poor sequencing with this for supine isometrics) Hip ABduction/ADduction: AROM;Both;5 reps;Supine(repeated cues for sequencing) Hip Flexion/Marching: AROM;Both;10 reps;Supine(repeated cues for sequencing)    General Comments  Pertinent Vitals/Pain Pain Assessment: Faces Faces Pain Scale: Hurts little more Pain Location: Sacral wound Pain Descriptors / Indicators: Grimacing;Guarding;Sore Pain Intervention(s): Monitored during  session;Repositioned    Home Living                      Prior Function            PT Goals (current goals can now be found in the care plan section) Acute Rehab PT Goals Patient Stated Goal: unable to state PT Goal Formulation: With patient Time For Goal Achievement: 06/10/17 Potential to Achieve Goals: Fair Progress towards PT goals: Progressing toward goals    Frequency    Min 2X/week      PT Plan Discharge plan needs to be updated;Frequency needs to be updated    Co-evaluation              AM-PAC PT "6 Clicks" Daily Activity  Outcome Measure  Difficulty turning over in bed (including adjusting bedclothes, sheets and blankets)?: Unable Difficulty moving from lying on back to sitting on the side of the bed? : Unable Difficulty sitting down on and standing up from a chair with arms (e.g., wheelchair, bedside commode, etc,.)?: Unable Help needed moving to and from a bed to chair (including a wheelchair)?: Total Help needed walking in hospital room?: Total Help needed climbing 3-5 steps with a railing? : Total 6 Click Score: 6    End of Session Equipment Utilized During Treatment: Oxygen(trach) Activity Tolerance: Patient tolerated treatment well;Patient limited by fatigue Patient left: in bed;with call bell/phone within reach;with nursing/sitter in room Nurse Communication: Mobility status;Need for lift equipment PT Visit Diagnosis: Other abnormalities of gait and mobility (R26.89);Muscle weakness (generalized) (M62.81)     Time: 1610-9604 PT Time Calculation (min) (ACUTE ONLY): 22 min  Charges:  $Therapeutic Exercise: 8-22 mins                    G Codes:      Ina Homes, PT, DPT Acute Rehab Services  Pager: [phone]  [name] 05/31/2017, 12:12 PM

## 2017-05-31 NOTE — Progress Notes (Signed)
ANTICOAGULATION CONSULT NOTE - Follow Up Consult  Pharmacy Consult for Heparin Indication: pulmonary embolus and DVT  No Known Allergies  Patient Measurements: Height: 5\' 10"  (177.8 cm) Weight: 186 lb (84.4 kg) IBW/kg (Calculated) : 73 Heparin Dosing Weight: 75 kg  Vital Signs: Temp: 99.3 F (37.4 C) (03/15 1605) Temp Source: Oral (03/15 1605) BP: 125/69 (03/15 1605) Pulse Rate: 88 (03/15 1605)  Labs: Recent Labs    05/29/17 0250  05/30/17 0316 05/31/17 0610 05/31/17 1659  HGB 8.8*  --  8.3* 7.8*  --   HCT 26.0*  --  25.5* 24.4*  --   PLT 313  --  333 379  --   HEPARINUNFRC 0.73*   < > 0.55 0.28* 0.22*  CREATININE 1.19  --  1.10 1.07  --    < > = values in this interval not displayed.    Estimated Creatinine Clearance: 70.1 mL/min (by C-G formula based on SCr of 1.07 mg/dL).  Assessment: Continues on IV heparin for bilateral DVTs and presumed massive PE. IVC filter placed 2/22.  Heparin level = 0.22 this PM  Goal of Therapy:  Heparin level 0.3-0.7 units/ml Monitor platelets by anticoagulation protocol: Yes Appropriate Zosyn dose for renal function and infection  Plan:  Increase heparin drip to 1850 units/hr. Daily heparin level and CBC while on heparin. Monitor for any s/sx bleeding. Follow up for transition to Eliquis when able.  Thank you Okey RegalLisa Arpi Diebold, PharmD [phone] 05/31/2017 6:48 PM

## 2017-06-01 LAB — BASIC METABOLIC PANEL
Anion gap: 9 (ref 5–15)
BUN: 24 mg/dL — ABNORMAL HIGH (ref 6–20)
CALCIUM: 8 mg/dL — AB (ref 8.9–10.3)
CO2: 25 mmol/L (ref 22–32)
CREATININE: 1 mg/dL (ref 0.61–1.24)
Chloride: 105 mmol/L (ref 101–111)
GFR calc non Af Amer: >60 mL/min (ref >60)
GLUCOSE: 140 mg/dL — AB (ref 65–99)
Potassium: 3.9 mmol/L (ref 3.5–5.1)
Sodium: 139 mmol/L (ref 135–145)

## 2017-06-01 LAB — CBC
HEMATOCRIT: 25.4 % — AB (ref 39.0–52.0)
Hemoglobin: 8.1 g/dL — ABNORMAL LOW (ref 13.0–17.0)
MCH: 28.5 pg (ref 26.0–34.0)
MCHC: 31.9 g/dL (ref 30.0–36.0)
MCV: 89.4 fL (ref 78.0–100.0)
PLATELETS: 404 10*3/uL — AB (ref 150–400)
RBC: 2.84 MIL/uL — ABNORMAL LOW (ref 4.22–5.81)
RDW: 17.7 % — AB (ref 11.5–15.5)
WBC: 21.6 10*3/uL — ABNORMAL HIGH (ref 4.0–10.5)

## 2017-06-01 LAB — HEPARIN LEVEL (UNFRACTIONATED): HEPARIN UNFRACTIONATED: 0.69 [IU]/mL (ref 0.30–0.70)

## 2017-06-01 MED ORDER — APIXABAN 5 MG PO TABS
5.0000 mg | ORAL_TABLET | Freq: Two times a day (BID) | ORAL | Status: DC
  Administered 2017-06-01 – 2017-06-08 (×15): 5 mg via ORAL
  Filled 2017-06-01 (×15): qty 1

## 2017-06-01 NOTE — Progress Notes (Signed)
CSW following for discharge plan. Patient not medically ready for SNF placement at this time.  CSW will continue to follow.  Blenda NicelyElizabeth Liandro Thelin, KentuckyLCSW Clinical Social Worker [phone]

## 2017-06-01 NOTE — Progress Notes (Signed)
ANTICOAGULATION CONSULT NOTE  Pharmacy Consult for Heparin Indication: DVT/PE  No Known Allergies  Patient Measurements: Height: 5\' 10"  (177.8 cm) Weight: 186 lb (84.4 kg) IBW/kg (Calculated) : 73 Heparin Dosing Weight:  72.6 kg  Vital Signs: Temp: 98.7 F (37.1 C) (03/15 2318) Temp Source: Oral (03/15 2318) BP: 122/78 (03/15 2318) Pulse Rate: 92 (03/16 0340)  Labs: Recent Labs    05/30/17 0316 05/31/17 0610 05/31/17 1659 06/01/17 0306  HGB 8.3* 7.8*  --  8.1*  HCT 25.5* 24.4*  --  25.4*  PLT 333 379  --  404*  HEPARINUNFRC 0.55 0.28* 0.22* 0.69  CREATININE 1.10 1.07  --   --    Estimated Creatinine Clearance: 70.1 mL/min (by C-G formula based on SCr of 1.07 mg/dL).  Assessment:  Anticoag: Heparin for new bilateral DVTs/possible PE, heparin level therapeutic x 1 after rate increase, Hgb low but stable x 3 days  Goal of Therapy:  Heparin level 0.3-0.7 units/ml Monitor platelets by anticoagulation protocol: Yes   Plan:  Cont heparin at 1850 units/hr 1200 HL  Abran DukeJames Malaika Arnall, PharmD, BCPS Clinical Pharmacist Phone: [phone]

## 2017-06-01 NOTE — Progress Notes (Signed)
ANTICOAGULATION CONSULT NOTE - Follow Up Consult  Pharmacy Consult for Heparin Indication: pulmonary embolus and DVT  No Known Allergies  Patient Measurements: Height: 5\' 10"  (177.8 cm) Weight: (Bed scale isn't right.  Can't stand) IBW/kg (Calculated) : 73 Heparin Dosing Weight: 75 kg  Vital Signs: Temp: 99.5 F (37.5 C) (03/16 1128) Temp Source: Oral (03/16 1128) BP: 116/73 (03/16 1128) Pulse Rate: 83 (03/16 1128)  Labs: Recent Labs    05/30/17 0316 05/31/17 0610 05/31/17 1659 06/01/17 0306  HGB 8.3* 7.8*  --  8.1*  HCT 25.5* 24.4*  --  25.4*  PLT 333 379  --  404*  HEPARINUNFRC 0.55 0.28* 0.22* 0.69  CREATININE 1.10 1.07  --  1.00    Estimated Creatinine Clearance: 75 mL/min (by C-G formula based on SCr of 1 mg/dL).  Assessment: Patient continues on IV heparin for bilateral DVTs and presumed massive PE. IVC filter placed 2/22.  Heparin level at upper end of range this morning, new orders to transition to apixaban. Patient has been on systemic IV heparin since 2/14, will not load with apixaban and start maintenance dose this morning. Hgb stable overnight at 8.1, scr normal at 1.0.   Pseudomonas UTI: continues on zosyn day #5. Afebrile overnight, wbc elevated at 21. No dose adjustments warranted at this time, pharmacy will continue to follow peripherally.   Goal of Therapy:  Monitor platelets by anticoagulation protocol: Yes Appropriate Zosyn dose for renal function and infection  Plan:  Stop heparin, start apixaban 5mg  bid this morning CBC in am then further checks by attending No change to zosyn dosing  Sheppard CoilFrank Wilson PharmD., BCPS Clinical Pharmacist 06/01/2017 12:20 PM

## 2017-06-01 NOTE — Progress Notes (Signed)
RT called to patient's due to patient removing trach. Patient's spO2, and WOB were stable during the incident and new 6 shiley cuffless trach placed.  CO2 color change confirmed as well as BBS present.

## 2017-06-01 NOTE — Progress Notes (Signed)
PROGRESS NOTE    Joseph Downs  FVC:[id number] DOB: [birth year] DOA: 05/01/2017 PCP: Patient, No Pcp Per     Brief Narrative:  Joseph Downs is a 67 yo M, reportedly homeless, no known past medical history other than obstructive uropathy requiring foley. Evidently, on 2/13, he was found down in the cold. He was brought to the ER, hypotensive, had lactate >7 despite 30 cc/kg IVF and re-warming. He was admitted to ICU, at that time was mentating enough to explain that he had passed out, not clear why. That night, he developed respiratory distress requiring intubation. He was treated with broad spectrum antibiotics for septic shock with hypotension, tachycardia and leukocytosis. He had renal failure, creatinine up to 7 and was started on CVVHD. Blood cultures grew Acineto lwoffii, urine cx frew staph lugdunensis. Echo showed severe RV strain and bilateral LE dopplers showed extensive DVT. CTA was not possible, nor catheterization/lysis due to AKI. He was started on heparin drip. At time of transfer out of ICU to [facility] service, patient has tolerated 2 days intermittent trach collar, back on vent at night. He has PEG. Cardiology, Nephrology, Vascular surgery, and Pulm were consulted. He is s/p B/L AKA due to frostbite.   3/16: Patient removed his trach overnight. It was replaced. No other acute events. Patient is awake, verbal, denies any pain. He is able to tell me that he is in [place] but does not know that he is at a hospital. Tells me year is 46.   Assessment & Plan:   Principal Problem:   Sepsis (Brenda) Active Problems:   Lactic acidemia   Acute respiratory failure (HCC)   Exposure to weather condition   Hypotension   Non-ST elevation (NSTEMI) myocardial infarction (HCC)   Malnutrition of moderate degree   Acute cystitis with hematuria   Acute deep vein thrombosis (DVT) of proximal vein of both lower extremities (HCC)   Pressure injury of skin   Cardiogenic shock (HCC)   Acute cor  pulmonale (HCC)   Acute respiratory failure with hypoxemia (HCC)   Deep vein thrombosis (DVT) of non-extremity vein   SIRS (systemic inflammatory response syndrome) (HCC)   S/P AKA (above knee amputation) bilateral (HCC)   Hypernatremia   Acute blood loss anemia   Encephalopathy   Postoperative pain   Pulmonary embolus (HCC)   Tracheostomy status (HCC)   Sepsis secondary to Pseudomonas UTI -Overnight on 3/8-11/2017, patient developed fever, leukocytosis, tachycardia, tachypnea -CXR reviewed and unremarkable for infection -UA: Few bacteria, 0-5 WBC, positive nitrates, trace leukocytes -Urine culture positive for pseudomonas, sensitive to cephalosporin, cipro, zosyn -Blood cultures from 05/25/2017 show no growth to date -Continue zosyn  -WBC still elevated without fever overnight, trending down, monitor WBC   Acute blood loss anemia/Normocytic Anemia -S/p bilateral AKA on 05/24/2017 -Hemoglobin dropped from 9.4 to 7.7 on 05/26/2017, however hemoglobin was 7.6 afterwards -Transfused 2u PRBC on 05/27/2017, hemoglobin today 8.1 -Monitor Hgb  Acute respiratory failure -PCCM follows as patient has trach collar -FEES done on 05/27/2017 and patient was placed on dysphagia 1 diet -Currently on cuffless 6, no decannulation given neurologic deficit. May discharge to trach SNF and follow up in trach clinic   Presumed massive pulmonary embolism/Bilateral DVT -Status post IVC filter, placed due to doubts of patient's future compliance with oral anticoagulation  -Hgb stable without overt bleed. Transition from heparin gtt to Eliquis today   Acute kidney injury -History of recent obstructive uropathy. ?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 (found on  UA/urine culture)- needs an outpatient work up -He did have a Foley catheter previous hospitalization -Upon admission, creatinine was 2.5, however peaked at 7.51 -Nephrology consulted and appreciated and is now signed  off -Resolved   Bacteremia -Cultures from 05/01/2017 showed diphtheroids, Acinetobacter lwoffii -Repeat blood cultures show no growth -Patient was placed on vancomycin and Zosyn however then transitioned to meropenem and completed course -Resolved   Hypernatremia -Resolved with free water and IVF  Acute metabolic encephalopathy -Avoid benzodiazepines -Continue Haldol -Stable   NSTEMI -Cardiology consulted and appreciated, continue aspirin -Possibly related to PE  Presumed frostbite/Gangrene of feet -Vascular surgery consulted and appreciated -Wound care consulted -S/p bilateral AKA 05/24/2017   DVT prophylaxis: Heparin gtt --> Eliquis  Code Status: Full Family Communication: No family at bedside Disposition Plan: SNF hopefully next week as long as leukocytosis improves and Hgb remains stable    Consultants:  PCCM Nephrology Cardiology Vascular surgery  Procedures:  Intubated/extubated on 05/01/2017 through 05/15/2017 IVC filter placement 05/10/2017 Trach placed on 05/15/2017 PEG placed 05/16/2017 Bilateral AKA 05/24/2017  Antimicrobials:  Anti-infectives (From admission, onward)   Start     Dose/Rate Route Frequency Ordered Stop   05/27/17 1400  piperacillin-tazobactam (ZOSYN) IVPB 3.375 g     3.375 g 12.5 mL/hr over 240 Minutes Intravenous Every 8 hours 05/27/17 0724     05/27/17 0730  piperacillin-tazobactam (ZOSYN) IVPB 3.375 g     3.375 g 100 mL/hr over 30 Minutes Intravenous  Once 05/27/17 0724 05/27/17 0908   05/25/17 1300  cefTRIAXone (ROCEPHIN) 1 g in sodium chloride 0.9 % 100 mL IVPB  Status:  Discontinued     1 g 200 mL/hr over 30 Minutes Intravenous Every 24 hours 05/25/17 1200 05/27/17 0706   05/24/17 1800  ceFAZolin (ANCEF) IVPB 2g/100 mL premix     2 g 200 mL/hr over 30 Minutes Intravenous Every 8 hours 05/24/17 1300 05/25/17 0238   05/07/17 2200  meropenem (MERREM) 500 mg in sodium chloride 0.9 % 100 mL IVPB     500 mg 200 mL/hr over 30 Minutes  Intravenous Every 24 hours 05/07/17 1306 05/19/17 1911   05/07/17 0930  meropenem (MERREM) 1 g in sodium chloride 0.9 % 100 mL IVPB  Status:  Discontinued     1 g 200 mL/hr over 30 Minutes Intravenous Every 12 hours 05/07/17 0908 05/07/17 1306   05/06/17 1300  meropenem (MERREM) 500 mg in sodium chloride 0.9 % 100 mL IVPB  Status:  Discontinued     500 mg 200 mL/hr over 30 Minutes Intravenous Every 24 hours 05/06/17 1146 05/07/17 0908   05/04/17 2200  vancomycin (VANCOCIN) IVPB 1000 mg/200 mL premix     1,000 mg 200 mL/hr over 60 Minutes Intravenous Every 24 hours 05/04/17 0933 05/07/17 2339   05/04/17 1400  piperacillin-tazobactam (ZOSYN) IVPB 3.375 g  Status:  Discontinued     3.375 g 100 mL/hr over 30 Minutes Intravenous Every 6 hours 05/04/17 0932 05/06/17 1146   05/03/17 1300  vancomycin (VANCOCIN) IVPB 1000 mg/200 mL premix  Status:  Discontinued     1,000 mg 200 mL/hr over 60 Minutes Intravenous Every 48 hours 05/02/17 1340 05/04/17 0933   05/02/17 2000  piperacillin-tazobactam (ZOSYN) IVPB 2.25 g  Status:  Discontinued     2.25 g 100 mL/hr over 30 Minutes Intravenous Every 6 hours 05/02/17 1342 05/04/17 0932   05/02/17 1300  vancomycin (VANCOCIN) IVPB 1000 mg/200 mL premix  Status:  Discontinued     1,000 mg 200 mL/hr over  60 Minutes Intravenous Every 24 hours 05/01/17 1313 05/02/17 1340   05/01/17 2000  piperacillin-tazobactam (ZOSYN) IVPB 3.375 g  Status:  Discontinued     3.375 g 12.5 mL/hr over 240 Minutes Intravenous Every 8 hours 05/01/17 1313 05/02/17 1342   05/01/17 1200  vancomycin (VANCOCIN) 1,750 mg in sodium chloride 0.9 % 500 mL IVPB     1,750 mg 250 mL/hr over 120 Minutes Intravenous  Once 05/01/17 1121 05/01/17 1409   05/01/17 1130  piperacillin-tazobactam (ZOSYN) IVPB 3.375 g     3.375 g 100 mL/hr over 30 Minutes Intravenous  Once 05/01/17 1119 05/01/17 1217   05/01/17 1130  vancomycin (VANCOCIN) IVPB 1000 mg/200 mL premix  Status:  Discontinued     1,000  mg 200 mL/hr over 60 Minutes Intravenous  Once 05/01/17 1119 05/01/17 1121       Subjective: Patient awake, alert this morning. He has no complaints of pain. States that he is in [place] (unaware of hospital) and it is year 46.   Objective: Vitals:   06/01/17 0546 06/01/17 0736 06/01/17 0850 06/01/17 1128  BP: 118/87 137/70  116/73  Pulse: 94 86 86 83  Resp: (!) 28 17 18  (!) 24  Temp: 98.8 F (37.1 C) 99.1 F (37.3 C)  99.5 F (37.5 C)  TempSrc: Oral Oral  Oral  SpO2: 100% 100% 100% 100%  Weight:      Height:        Intake/Output Summary (Last 24 hours) at 06/01/2017 1206 Last data filed at 06/01/2017 0900 Gross per 24 hour  Intake 1797.27 ml  Output 2075 ml  Net -277.73 ml   Filed Weights   05/29/17 0306 05/30/17 0313 05/31/17 0300  Weight: 75 kg (165 lb 5.5 oz) 83 kg (183 lb) 84.4 kg (186 lb)   Examination: General exam: Appears calm and comfortable  Respiratory system: Clear to auscultation. Respiratory effort normal. On trach collar  Cardiovascular system: S1 & S2 heard, RRR. No JVD, murmurs, rubs, gallops or clicks. No pedal edema. Gastrointestinal system: Abdomen is nondistended, soft and nontender. No organomegaly or masses felt. Normal bowel sounds heard. Central nervous system: Alert, answers questions, not oriented to place or year  Extremities: Bilateral AKA  Skin: No rashes, lesions or ulcers  Data Reviewed: I have personally reviewed following labs and imaging studies  CBC: Recent Labs  Lab 05/28/17 1710 05/29/17 0250 05/30/17 0316 05/31/17 0610 06/01/17 0306  WBC 25.9* 22.0* 25.7* 24.2* 21.6*  HGB 10.0* 8.8* 8.3* 7.8* 8.1*  HCT 29.1* 26.0* 25.5* 24.4* 25.4*  MCV 84.8 86.4 87.6 89.4 89.4  PLT 327 313 333 379 659*   Basic Metabolic Panel: Recent Labs  Lab 05/26/17 0950 05/27/17 0202 05/28/17 0228 05/29/17 0250 05/30/17 0316 05/31/17 0610 06/01/17 0306  NA 137 136  137 139 138 139 139 139  K 3.6 4.0  4.0 4.2 4.0 4.2 4.1 3.9  CL  106 104  105 105 105 106 106 105  CO2 22 22  22 24 23 25 24 25   GLUCOSE 218* 159*  160* 125* 144* 166* 142* 140*  BUN 28* 31*  31* 34* 28* 27* 26* 24*  CREATININE 1.37* 1.38*  1.34* 1.41* 1.19 1.10 1.07 1.00  CALCIUM 6.7* 6.9*  7.0* 7.7* 7.7* 7.8* 8.0* 8.0*  MG  --   --   --  1.7  --   --   --   PHOS 2.7 3.0 3.4 3.0  --   --   --    GFR:  Estimated Creatinine Clearance: 75 mL/min (by C-G formula based on SCr of 1 mg/dL). Liver Function Tests: Recent Labs  Lab 05/26/17 0950 05/27/17 0202 05/28/17 0228 05/29/17 0250  ALBUMIN 1.6* 1.5* 1.7* 1.5*   No results for input(s): LIPASE, AMYLASE in the last 168 hours. No results for input(s): AMMONIA in the last 168 hours. Coagulation Profile: No results for input(s): INR, PROTIME in the last 168 hours. Cardiac Enzymes: No results for input(s): CKTOTAL, CKMB, CKMBINDEX, TROPONINI in the last 168 hours. BNP (last 3 results) No results for input(s): PROBNP in the last 8760 hours. HbA1C: No results for input(s): HGBA1C in the last 72 hours. CBG: Recent Labs  Lab 05/28/17 2310 05/29/17 0325 05/29/17 0834 05/29/17 1315 05/29/17 1712  GLUCAP 148* 131* 144* 106* 130*   Lipid Profile: No results for input(s): CHOL, HDL, LDLCALC, TRIG, CHOLHDL, LDLDIRECT in the last 72 hours. Thyroid Function Tests: No results for input(s): TSH, T4TOTAL, FREET4, T3FREE, THYROIDAB in the last 72 hours. Anemia Panel: No results for input(s): VITAMINB12, FOLATE, FERRITIN, TIBC, IRON, RETICCTPCT in the last 72 hours. Sepsis Labs: No results for input(s): PROCALCITON, LATICACIDVEN in the last 168 hours.  Recent Results (from the past 240 hour(s))  Urine Culture     Status: Abnormal   Collection Time: 05/25/17 10:10 AM  Result Value Ref Range Status   Specimen Description URINE, CLEAN CATCH  Final   Special Requests   Final    NONE Performed at [facility], [address], [place], [place] [address]    Culture >=100,000 COLONIES/mL  PSEUDOMONAS AERUGINOSA (A)  Final   Report Status 05/28/2017 FINAL  Final   Organism ID, Bacteria PSEUDOMONAS AERUGINOSA (A)  Final      Susceptibility   Pseudomonas aeruginosa - MIC*    CEFTAZIDIME 2 SENSITIVE Sensitive     CIPROFLOXACIN <=0.25 SENSITIVE Sensitive     GENTAMICIN <=1 SENSITIVE Sensitive     IMIPENEM >=16 RESISTANT Resistant     PIP/TAZO 8 SENSITIVE Sensitive     CEFEPIME <=1 SENSITIVE Sensitive     * >=100,000 COLONIES/mL PSEUDOMONAS AERUGINOSA  Culture, blood (routine x 2)     Status: None   Collection Time: 05/25/17 10:18 AM  Result Value Ref Range Status   Specimen Description BLOOD LEFT HAND  Final   Special Requests   Final    BOTTLES DRAWN AEROBIC AND ANAEROBIC Blood Culture adequate volume   Culture   Final    NO GROWTH 5 DAYS Performed at [facility], [address], [place], [place] [address]    Report Status 05/30/2017 FINAL  Final  Culture, blood (routine x 2)     Status: None   Collection Time: 05/25/17 10:32 AM  Result Value Ref Range Status   Specimen Description BLOOD RIGHT HAND  Final   Special Requests   Final    BOTTLES DRAWN AEROBIC AND ANAEROBIC Blood Culture adequate volume   Culture   Final    NO GROWTH 5 DAYS Performed at [facility], [place] [address], [place], [place] [address]    Report Status 05/30/2017 FINAL  Final       Radiology Studies: No results found.    Scheduled Meds: . aspirin  81 mg Per Tube Daily  . chlorhexidine gluconate (MEDLINE KIT)  15 mL Mouth Rinse BID  . collagenase   Topical Daily  . feeding supplement (PRO-STAT SUGAR FREE 64)  30 mL Per Tube BID  . free water  250 mL  Per Tube Q8H  . haloperidol  1 mg Oral Daily  . haloperidol  2 mg Oral QHS  . mouth rinse  15 mL Mouth Rinse QID  . Melatonin  6 mg Oral QHS  . multivitamin  15 mL Oral Daily  . pantoprazole sodium  40 mg Per Tube QHS  . thiamine  100 mg Per Tube Daily   Continuous Infusions: . feeding supplement (JEVITY 1.5 CAL/FIBER)  Stopped (06/01/17 0856)  . heparin 1,850 Units/hr (06/01/17 0100)  . magnesium sulfate 1 - 4 g bolus IVPB    . piperacillin-tazobactam (ZOSYN)  IV Stopped (06/01/17 1038)     LOS: 31 days    Time spent: 20 minutes   Dessa Phi, DO [facility] [email or web address] Password [facility] 06/01/2017, 12:06 PM

## 2017-06-02 LAB — CBC
HCT: 25.9 % — ABNORMAL LOW (ref 39.0–52.0)
HEMOGLOBIN: 8.1 g/dL — AB (ref 13.0–17.0)
MCH: 28.1 pg (ref 26.0–34.0)
MCHC: 31.3 g/dL (ref 30.0–36.0)
MCV: 89.9 fL (ref 78.0–100.0)
Platelets: 439 10*3/uL — ABNORMAL HIGH (ref 150–400)
RBC: 2.88 MIL/uL — AB (ref 4.22–5.81)
RDW: 17.5 % — ABNORMAL HIGH (ref 11.5–15.5)
WBC: 19.3 10*3/uL — AB (ref 4.0–10.5)

## 2017-06-02 LAB — BASIC METABOLIC PANEL
ANION GAP: 10 (ref 5–15)
BUN: 22 mg/dL — ABNORMAL HIGH (ref 6–20)
CALCIUM: 8.2 mg/dL — AB (ref 8.9–10.3)
CHLORIDE: 105 mmol/L (ref 101–111)
CO2: 25 mmol/L (ref 22–32)
Creatinine, Ser: 1.08 mg/dL (ref 0.61–1.24)
GFR calc non Af Amer: >60 mL/min (ref >60)
Glucose, Bld: 139 mg/dL — ABNORMAL HIGH (ref 65–99)
Potassium: 4 mmol/L (ref 3.5–5.1)
SODIUM: 140 mmol/L (ref 135–145)

## 2017-06-02 NOTE — Progress Notes (Signed)
PROGRESS NOTE    Joseph Downs  MHD:[id number] DOB: 03-Aug-1950 DOA: 05/01/2017 PCP: Patient, No Pcp Per     Brief Narrative:  Joseph Downs is a 67 yo M, reportedly homeless, no known past medical history other than obstructive uropathy requiring foley. Evidently, on 2/13, he was found down in the cold. He was brought to the ER, hypotensive, had lactate >7 despite 30 cc/kg IVF and re-warming. He was admitted to ICU, at that time was mentating enough to explain that he had passed out, not clear why. That night, he developed respiratory distress requiring intubation. He was treated with broad spectrum antibiotics for septic shock with hypotension, tachycardia and leukocytosis. He had renal failure, creatinine up to 7 and was started on CVVHD. Blood cultures grew Acineto lwoffii, urine cx frew staph lugdunensis. Echo showed severe RV strain and bilateral LE dopplers showed extensive DVT. CTA was not possible, nor catheterization/lysis due to AKI. He was started on heparin drip. At time of transfer out of ICU to [facility] service, patient has tolerated trach collar. He has PEG. Cardiology, Nephrology, Vascular surgery, and Pulm were consulted. He is s/p B/L AKA due to frostbite.   3/16: Patient removed his trach overnight again. It was replaced. No other acute events. He is awake, verbal, continues to be confused. States he is at a hospital but unable to tell me which one. Tells me year is 83. Has no acute complaints.    Assessment & Plan:   Principal Problem:   Sepsis ([place]) Active Problems:   Lactic acidemia   Acute respiratory failure (HCC)   Exposure to weather condition   Hypotension   Non-ST elevation (NSTEMI) myocardial infarction (HCC)   Malnutrition of moderate degree   Acute cystitis with hematuria   Acute deep vein thrombosis (DVT) of proximal vein of both lower extremities (HCC)   Pressure injury of skin   Cardiogenic shock (HCC)   Acute cor pulmonale (HCC)   Acute respiratory  failure with hypoxemia (HCC)   Deep vein thrombosis (DVT) of non-extremity vein   SIRS (systemic inflammatory response syndrome) (HCC)   S/P AKA (above knee amputation) bilateral (HCC)   Hypernatremia   Acute blood loss anemia   Encephalopathy   Postoperative pain   Pulmonary embolus (HCC)   Tracheostomy status (HCC)   Sepsis secondary to Pseudomonas UTI -Overnight on 3/8-11/2017, patient developed fever, leukocytosis, tachycardia, tachypnea -CXR reviewed and unremarkable for infection -UA: Few bacteria, 0-5 WBC, positive nitrates, trace leukocytes -Urine culture positive for pseudomonas, sensitive to cephalosporin, cipro, zosyn -Blood cultures from 05/25/2017 show no growth to date -Continue zosyn  -WBC trending down, monitor WBC   Acute blood loss anemia/Normocytic Anemia -S/p bilateral AKA on 05/24/2017 -Transfused 2u PRBC on 05/27/2017 -Hemoglobin stable, monitor Hgb  Acute respiratory failure -PCCM follows as patient has trach collar -FEES done on 05/27/2017 and patient was placed on dysphagia 1 diet -Currently on cuffless 6, no decannulation given neurologic deficit. May discharge to trach SNF and follow up in trach clinic   Presumed massive pulmonary embolism/Bilateral DVT -Status post IVC filter, placed due to doubts of patient's future compliance with oral anticoagulation  -Hgb stable without overt bleed -Continue Eliquis   Acute kidney injury -History of recent obstructive uropathy. ?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/urine culture)- needs an outpatient work up -He did have a Foley catheter previous hospitalization -Upon admission, creatinine was 2.5, however peaked at 7.51 -Nephrology consulted and appreciated and is now signed off -  Resolved   Bacteremia -Cultures from 05/01/2017 showed diphtheroids, Acinetobacter lwoffii -Repeat blood cultures show no growth -Patient was placed on vancomycin and Zosyn however then  transitioned to meropenem and completed course -Resolved   Hypernatremia -Resolved with free water and IVF  Acute metabolic encephalopathy -Avoid benzodiazepines -Continue Haldol -Stable   NSTEMI -Cardiology consulted and appreciated, continue aspirin -Possibly related to PE  Presumed frostbite/Gangrene of feet -Vascular surgery consulted and appreciated -Wound care consulted -S/p bilateral AKA 05/24/2017   DVT prophylaxis: Eliquis  Code Status: Full Family Communication: No family at bedside Disposition Plan: SNF hopefully next week as long as leukocytosis improves and Hgb remains stable    Consultants:  PCCM Nephrology Cardiology Vascular surgery  Procedures:  Intubated/extubated on 05/01/2017 through 05/15/2017 IVC filter placement 05/10/2017 Trach placed on 05/15/2017 PEG placed 05/16/2017 Bilateral AKA 05/24/2017  Antimicrobials:  Anti-infectives (From admission, onward)   Start     Dose/Rate Route Frequency Ordered Stop   05/27/17 1400  piperacillin-tazobactam (ZOSYN) IVPB 3.375 g     3.375 g 12.5 mL/hr over 240 Minutes Intravenous Every 8 hours 05/27/17 0724     05/27/17 0730  piperacillin-tazobactam (ZOSYN) IVPB 3.375 g     3.375 g 100 mL/hr over 30 Minutes Intravenous  Once 05/27/17 0724 05/27/17 0908   05/25/17 1300  cefTRIAXone (ROCEPHIN) 1 g in sodium chloride 0.9 % 100 mL IVPB  Status:  Discontinued     1 g 200 mL/hr over 30 Minutes Intravenous Every 24 hours 05/25/17 1200 05/27/17 0706   05/24/17 1800  ceFAZolin (ANCEF) IVPB 2g/100 mL premix     2 g 200 mL/hr over 30 Minutes Intravenous Every 8 hours 05/24/17 1300 05/25/17 0238   05/07/17 2200  meropenem (MERREM) 500 mg in sodium chloride 0.9 % 100 mL IVPB     500 mg 200 mL/hr over 30 Minutes Intravenous Every 24 hours 05/07/17 1306 05/19/17 1911   05/07/17 0930  meropenem (MERREM) 1 g in sodium chloride 0.9 % 100 mL IVPB  Status:  Discontinued     1 g 200 mL/hr over 30 Minutes Intravenous Every 12  hours 05/07/17 0908 05/07/17 1306   05/06/17 1300  meropenem (MERREM) 500 mg in sodium chloride 0.9 % 100 mL IVPB  Status:  Discontinued     500 mg 200 mL/hr over 30 Minutes Intravenous Every 24 hours 05/06/17 1146 05/07/17 0908   05/04/17 2200  vancomycin (VANCOCIN) IVPB 1000 mg/200 mL premix     1,000 mg 200 mL/hr over 60 Minutes Intravenous Every 24 hours 05/04/17 0933 05/07/17 2339   05/04/17 1400  piperacillin-tazobactam (ZOSYN) IVPB 3.375 g  Status:  Discontinued     3.375 g 100 mL/hr over 30 Minutes Intravenous Every 6 hours 05/04/17 0932 05/06/17 1146   05/03/17 1300  vancomycin (VANCOCIN) IVPB 1000 mg/200 mL premix  Status:  Discontinued     1,000 mg 200 mL/hr over 60 Minutes Intravenous Every 48 hours 05/02/17 1340 05/04/17 0933   05/02/17 2000  piperacillin-tazobactam (ZOSYN) IVPB 2.25 g  Status:  Discontinued     2.25 g 100 mL/hr over 30 Minutes Intravenous Every 6 hours 05/02/17 1342 05/04/17 0932   05/02/17 1300  vancomycin (VANCOCIN) IVPB 1000 mg/200 mL premix  Status:  Discontinued     1,000 mg 200 mL/hr over 60 Minutes Intravenous Every 24 hours 05/01/17 1313 05/02/17 1340   05/01/17 2000  piperacillin-tazobactam (ZOSYN) IVPB 3.375 g  Status:  Discontinued     3.375 g 12.5 mL/hr over 240 Minutes Intravenous  Every 8 hours 05/01/17 1313 05/02/17 1342   05/01/17 1200  vancomycin (VANCOCIN) 1,750 mg in sodium chloride 0.9 % 500 mL IVPB     1,750 mg 250 mL/hr over 120 Minutes Intravenous  Once 05/01/17 1121 05/01/17 1409   05/01/17 1130  piperacillin-tazobactam (ZOSYN) IVPB 3.375 g     3.375 g 100 mL/hr over 30 Minutes Intravenous  Once 05/01/17 1119 05/01/17 1217   05/01/17 1130  vancomycin (VANCOCIN) IVPB 1000 mg/200 mL premix  Status:  Discontinued     1,000 mg 200 mL/hr over 60 Minutes Intravenous  Once 05/01/17 1119 05/01/17 1121       Objective: Vitals:   06/02/17 0354 06/02/17 0748 06/02/17 0750 06/02/17 1116  BP: 111/88 (!) 146/78 (!) 146/78   Pulse: (!) 101  93 92 81  Resp: (!) 23 (!) 22 (!) 22 13  Temp: 98.9 F (37.2 C) 99.6 F (37.6 C)    TempSrc: Oral Oral    SpO2: 99% 99% 99% 100%  Weight:      Height:        Intake/Output Summary (Last 24 hours) at 06/02/2017 1140 Last data filed at 06/02/2017 0900 Gross per 24 hour  Intake 1775 ml  Output 1204 ml  Net 571 ml   Filed Weights   05/30/17 0313 05/31/17 0300  Weight: 83 kg (183 lb) 84.4 kg (186 lb)    Examination: General exam: Appears calm and comfortable  Respiratory system: Clear to auscultation. Respiratory effort normal. On trach collar  Cardiovascular system: S1 & S2 heard, RRR. No JVD, murmurs, rubs, gallops or clicks. No pedal edema. Gastrointestinal system: Abdomen is nondistended, soft and nontender. No organomegaly or masses felt. Normal bowel sounds heard. Central nervous system: Alert but not oriented to place or year.  Extremities: Bilateral AKA  Skin: No rashes, lesions or ulcers   Data Reviewed: I have personally reviewed following labs and imaging studies  CBC: Recent Labs  Lab 05/29/17 0250 05/30/17 0316 05/31/17 0610 06/01/17 0306 06/02/17 0228  WBC 22.0* 25.7* 24.2* 21.6* 19.3*  HGB 8.8* 8.3* 7.8* 8.1* 8.1*  HCT 26.0* 25.5* 24.4* 25.4* 25.9*  MCV 86.4 87.6 89.4 89.4 89.9  PLT 313 333 379 404* 076*   Basic Metabolic Panel: Recent Labs  Lab 05/27/17 0202 05/28/17 0228 05/29/17 0250 05/30/17 0316 05/31/17 0610 06/01/17 0306 06/02/17 0228  NA 136  137 139 138 139 139 139 140  K 4.0  4.0 4.2 4.0 4.2 4.1 3.9 4.0  CL 104  105 105 105 106 106 105 105  CO2 22  22 24 23 25 24 25 25   GLUCOSE 159*  160* 125* 144* 166* 142* 140* 139*  BUN 31*  31* 34* 28* 27* 26* 24* 22*  CREATININE 1.38*  1.34* 1.41* 1.19 1.10 1.07 1.00 1.08  CALCIUM 6.9*  7.0* 7.7* 7.7* 7.8* 8.0* 8.0* 8.2*  MG  --   --  1.7  --   --   --   --   PHOS 3.0 3.4 3.0  --   --   --   --    GFR: Estimated Creatinine Clearance: 69.5 mL/min (by C-G formula based on SCr of 1.08  mg/dL). Liver Function Tests: Recent Labs  Lab 05/27/17 0202 05/28/17 0228 05/29/17 0250  ALBUMIN 1.5* 1.7* 1.5*   No results for input(s): LIPASE, AMYLASE in the last 168 hours. No results for input(s): AMMONIA in the last 168 hours. Coagulation Profile: No results for input(s): INR, PROTIME in the last 168 hours.  Cardiac Enzymes: No results for input(s): CKTOTAL, CKMB, CKMBINDEX, TROPONINI in the last 168 hours. BNP (last 3 results) No results for input(s): PROBNP in the last 8760 hours. HbA1C: No results for input(s): HGBA1C in the last 72 hours. CBG: Recent Labs  Lab 05/28/17 2310 05/29/17 0325 05/29/17 0834 05/29/17 1315 05/29/17 1712  GLUCAP 148* 131* 144* 106* 130*   Lipid Profile: No results for input(s): CHOL, HDL, LDLCALC, TRIG, CHOLHDL, LDLDIRECT in the last 72 hours. Thyroid Function Tests: No results for input(s): TSH, T4TOTAL, FREET4, T3FREE, THYROIDAB in the last 72 hours. Anemia Panel: No results for input(s): VITAMINB12, FOLATE, FERRITIN, TIBC, IRON, RETICCTPCT in the last 72 hours. Sepsis Labs: No results for input(s): PROCALCITON, LATICACIDVEN in the last 168 hours.  Recent Results (from the past 240 hour(s))  Urine Culture     Status: Abnormal   Collection Time: 05/25/17 10:10 AM  Result Value Ref Range Status   Specimen Description URINE, CLEAN CATCH  Final   Special Requests   Final    NONE Performed at [facility], [address], [place], [place] [address]    Culture >=100,000 COLONIES/mL PSEUDOMONAS AERUGINOSA (A)  Final   Report Status 05/28/2017 FINAL  Final   Organism ID, Bacteria PSEUDOMONAS AERUGINOSA (A)  Final      Susceptibility   Pseudomonas aeruginosa - MIC*    CEFTAZIDIME 2 SENSITIVE Sensitive     CIPROFLOXACIN <=0.25 SENSITIVE Sensitive     GENTAMICIN <=1 SENSITIVE Sensitive     IMIPENEM >=16 RESISTANT Resistant     PIP/TAZO 8 SENSITIVE Sensitive     CEFEPIME <=1 SENSITIVE Sensitive     * >=100,000 COLONIES/mL  PSEUDOMONAS AERUGINOSA  Culture, blood (routine x 2)     Status: None   Collection Time: 05/25/17 10:18 AM  Result Value Ref Range Status   Specimen Description BLOOD LEFT HAND  Final   Special Requests   Final    BOTTLES DRAWN AEROBIC AND ANAEROBIC Blood Culture adequate volume   Culture   Final    NO GROWTH 5 DAYS Performed at [facility], [address] [place], [place] [address]    Report Status 05/30/2017 FINAL  Final  Culture, blood (routine x 2)     Status: None   Collection Time: 05/25/17 10:32 AM  Result Value Ref Range Status   Specimen Description BLOOD RIGHT HAND  Final   Special Requests   Final    BOTTLES DRAWN AEROBIC AND ANAEROBIC Blood Culture adequate volume   Culture   Final    NO GROWTH 5 DAYS Performed at [facility], [place] [address], [place], [place] [address]    Report Status 05/30/2017 FINAL  Final       Radiology Studies: No results found.    Scheduled Meds: . apixaban  5 mg Oral BID  . aspirin  81 mg Per Tube Daily  . chlorhexidine gluconate (MEDLINE KIT)  15 mL Mouth Rinse BID  . collagenase   Topical Daily  . feeding supplement (PRO-STAT SUGAR FREE 64)  30 mL Per Tube BID  . free water  250 mL Per Tube Q8H  . haloperidol  1 mg Oral Daily  . haloperidol  2 mg Oral QHS  . mouth rinse  15 mL Mouth Rinse QID  . Melatonin  6 mg Oral QHS  . multivitamin  15 mL Oral Daily  . pantoprazole sodium  40 mg Per Tube QHS  . thiamine  100 mg Per Tube  Daily   Continuous Infusions: . feeding supplement (JEVITY 1.5 CAL/FIBER) Stopped (06/02/17 0847)  . magnesium sulfate 1 - 4 g bolus IVPB    . piperacillin-tazobactam (ZOSYN)  IV Stopped (06/02/17 0104)     LOS: 32 days    Time spent: 20 minutes   Dessa Phi, DO [facility] [email or web address] Password [email or web address] 06/02/2017, 11:40 AM

## 2017-06-02 NOTE — Progress Notes (Signed)
Called into pt room by telesitter, pt pulling at trach.  Walked into room to find pt decannulated self.  In no acute distress, oxygen sat 100%.  Respiratory therapy called and came to room.  Trach replaced without difficulty.

## 2017-06-02 NOTE — Progress Notes (Signed)
This note also relates to the following rows which could not be included: BP - Cannot attach notes to unvalidated device data Patient pulled out trach. Trach taken and cleaned and sanitized, then using respiratory protocol trach was reinserted with no issues. Slight resistance going through the initial opening, but after through trach went into place and patient had BBS throughout, good cough with production, and positive EtCo2 on disposable reader. NO issues patient sats remain high and no distress noted throughout.

## 2017-06-02 NOTE — Progress Notes (Signed)
Clinical Social Worker following patient/family for support and discharge needs. CSW spoke with patients sister Eber JonesCarolyn via phone. CSW asked if Eber JonesCarolyn had applied for long term Medicaid on behalf of patient. Carol;yn stated she is still in the proceed of filling out the paperwork for patient. Eber JonesCarolyn had CSW on speaker phone and patients RN Babette Relicammy was present. RN stated she has been Research scientist (physical sciences)assisting Carolyn with application for Medicare part B and stated that MD will contact DSS with a statement  Stating that patient will need assistance from [place]. RN stated that at this time patient is oriented and will be able to help fill out the rest of the Medicaid application. CSW will continue to follow patient for discharge needs.    Marrianne MoodAshley Gusta Marksberry, MSW,  [employer] [phone]

## 2017-06-03 LAB — CBC
HEMATOCRIT: 23.9 % — AB (ref 39.0–52.0)
Hemoglobin: 7.6 g/dL — ABNORMAL LOW (ref 13.0–17.0)
MCH: 28.8 pg (ref 26.0–34.0)
MCHC: 31.8 g/dL (ref 30.0–36.0)
MCV: 90.5 fL (ref 78.0–100.0)
PLATELETS: 411 10*3/uL — AB (ref 150–400)
RBC: 2.64 MIL/uL — ABNORMAL LOW (ref 4.22–5.81)
RDW: 17.7 % — AB (ref 11.5–15.5)
WBC: 16.8 10*3/uL — AB (ref 4.0–10.5)

## 2017-06-03 LAB — BASIC METABOLIC PANEL
Anion gap: 9 (ref 5–15)
BUN: 19 mg/dL (ref 6–20)
CALCIUM: 8.1 mg/dL — AB (ref 8.9–10.3)
CO2: 24 mmol/L (ref 22–32)
CREATININE: 0.99 mg/dL (ref 0.61–1.24)
Chloride: 104 mmol/L (ref 101–111)
GFR calc Af Amer: >60 mL/min (ref >60)
GLUCOSE: 153 mg/dL — AB (ref 65–99)
POTASSIUM: 4.2 mmol/L (ref 3.5–5.1)
SODIUM: 137 mmol/L (ref 135–145)

## 2017-06-03 MED ORDER — DIPHENOXYLATE-ATROPINE 2.5-0.025 MG/5ML PO LIQD
5.0000 mL | Freq: Four times a day (QID) | ORAL | Status: DC | PRN
  Administered 2017-06-03: 5 mL
  Filled 2017-06-03: qty 5

## 2017-06-03 NOTE — Progress Notes (Signed)
  Speech Language Pathology Treatment: Dysphagia  Patient Details Name: Joseph Downs MRN: [id number] DOB: Jan 08, 1951 Today's Date: 06/03/2017 Time: [id number] SLP Time Calculation (min) (ACUTE ONLY): 17 min  Assessment / Plan / Recommendation Clinical Impression  Pt seen for dysphagia treatment. SLP unable to locate PMV anywhere in pt's room (will get replacement). He is speaking at sentence level around the trach and FEES was performed without valve in place however PMV will restore partial pressure for increased length of utterance and assist in clearance of secretions. Pt is alert, communicative and ex-wife reported he did not wear dentures at home when eating. Mastication adequate with Dys 3 texture and no residual; no s/s aspiration with solids or honey thick liquids. Will upgrade diet to Dys 3 (educated pt/ex-wife re: menu) and continue honey thick liquids; will perform MBS tomorrow for possible liquid upgrade.    HPI HPI: 67 year old homeless man found down/13 with hypothermia, shock lactic acidosis. 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 HehadAcinetobacter in blood and staph in urine with elevated PSA. Unable to be weaned from vent (ETT 2/13-2/27), trach placed 2/27, PEG 2/28.       SLP Plan  MBS       Recommendations  Diet recommendations: Dysphagia 3 (mechanical soft);Honey-thick liquid Liquids provided via: Cup Medication Administration: Crushed with puree Supervision: Patient able to self feed;Staff to assist with self feeding;Full supervision/cueing for compensatory strategies Compensations: Slow rate;Small sips/bites;Minimize environmental distractions;Clear throat intermittently;Lingual sweep for clearance of pocketing Postural Changes and/or Swallow Maneuvers: Seated upright 90 degrees                Oral Care Recommendations: Oral care BID Follow up Recommendations: Skilled Nursing facility SLP Visit Diagnosis: Dysphagia, unspecified  (R13.10) Plan: MBS       GO                Joseph Downs, Joseph Downs 06/03/2017, 10:15 AM  Joseph CoonsLisa Downs Joseph Downs M.Ed Joseph Downs Pager [phone]

## 2017-06-03 NOTE — Progress Notes (Signed)
PROGRESS NOTE    Joseph Downs  CBJ:[id number] DOB: 1951-01-10 DOA: 05/01/2017 PCP: Patient, No Pcp Per     Brief Narrative:  Joseph Downs is a 67 yo M, reportedly homeless, no known past medical history other than obstructive uropathy requiring foley. Evidently, on 2/13, he was found down in the cold. He was brought to the ER, hypotensive, had lactate >7 despite 30 cc/kg IVF and re-warming. He was admitted to ICU, at that time was mentating enough to explain that he had passed out, not clear why. That night, he developed respiratory distress requiring intubation. He was treated with broad spectrum antibiotics for septic shock with hypotension, tachycardia and leukocytosis. He had renal failure, creatinine up to 7 and was started on CVVHD. Blood cultures grew [place] lwoffii, urine cx frew staph lugdunensis. Echo showed severe RV strain and bilateral LE dopplers showed extensive DVT. CTA was not possible, nor catheterization/lysis due to AKI. He was started on heparin drip. At time of transfer out of ICU to [facility] service, patient has tolerated trach collar. He has PEG. Cardiology, Nephrology, Vascular surgery, and Pulm were consulted. He is s/p B/L AKA due to frostbite.   3/16: No acute events overnight. RN states patient continues to have loose stools, but too thick for flexiseal. Likely due to tube feeding. Patient is alert, drinking juice in bed. Denies any pain.   Assessment & Plan:   Principal Problem:   Sepsis ([place]) Active Problems:   Lactic acidemia   Acute respiratory failure (HCC)   Exposure to weather condition   Hypotension   Non-ST elevation (NSTEMI) myocardial infarction (HCC)   Malnutrition of moderate degree   Acute cystitis with hematuria   Acute deep vein thrombosis (DVT) of proximal vein of both lower extremities (HCC)   Pressure injury of skin   Cardiogenic shock (HCC)   Acute cor pulmonale (HCC)   Acute respiratory failure with hypoxemia (HCC)   Deep vein  thrombosis (DVT) of non-extremity vein   SIRS (systemic inflammatory response syndrome) (HCC)   S/P AKA (above knee amputation) bilateral (HCC)   Hypernatremia   Acute blood loss anemia   Encephalopathy   Postoperative pain   Pulmonary embolus (HCC)   Tracheostomy status (HCC)   Sepsis secondary to Pseudomonas UTI -Overnight on 3/8-11/2017, patient developed fever, leukocytosis, tachycardia, tachypnea -CXR reviewed and unremarkable for infection -UA: Few bacteria, 0-5 WBC, positive nitrates, trace leukocytes -Urine culture positive for pseudomonas, sensitive to cephalosporin, cipro, zosyn -Blood cultures from 05/25/2017 show no growth to date -Completed 7 days of zosyn  -WBC trending down, monitor WBC. Afebrile.   Acute blood loss anemia/Normocytic Anemia -S/p bilateral AKA on 05/24/2017 -Transfused 2u PRBC on 05/27/2017 -Hemoglobin has been in the 7.6-8.8 range. Monitor.   Acute respiratory failure -PCCM follows as patient has trach collar -FEES done on 05/27/2017 and patient was placed on dysphagia 1 diet -Currently on cuffless 6. May discharge to trach SNF and follow up in trach clinic. Spoke with Dr. Nelda Marseille PCCM today; plan to discharge without downsizing at this time.   Presumed massive pulmonary embolism/Bilateral DVT -Status post IVC filter, placed due to doubts of patient's future compliance with oral anticoagulation  -Continue Eliquis   Acute kidney injury -History of recent obstructive uropathy. ?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/urine culture)- needs an outpatient work up -He did have a Foley catheter previous hospitalization -Upon admission, creatinine was 2.5, however peaked at 7.51 -Nephrology consulted and appreciated and is now signed  off -Resolved   Bacteremia -Cultures from 05/01/2017 showed diphtheroids, Acinetobacter lwoffii -Repeat blood cultures show no growth -Patient was placed on vancomycin and Zosyn however then  transitioned to meropenem and completed course -Resolved   Hypernatremia -Resolved with free water and IVF  Acute metabolic encephalopathy -Avoid benzodiazepines -Continue Haldol -Stable   NSTEMI -Cardiology consulted and appreciated, continue aspirin -Possibly related to PE  Presumed frostbite/Gangrene of feet -Vascular surgery consulted and appreciated -Wound care consulted -S/p bilateral AKA 05/24/2017   DVT prophylaxis: Eliquis  Code Status: Full Family Communication: Spoke with sister over the phone 3/18. At sister's request, I attempted to contact Ms. Pittman with social security office. Sister has told me that social security office needs a letter from a physician stating that patient is unable manage his own finances. I could not get ahold of Ms. Pittman today. I believe that such request for a letter is out of scope of my medical practice. I also discussed this with my colleagues, SW, CM. I conveyed this to patient's sister; she needs to fill out Medicare application, but I would be unable to provide a letter stating patient is unable to manage own finances.  Disposition Plan: SNF hopefully this week. Continue to watch WBC and Hgb. He is now off IV antibiotics and IV heparin. SW working on placement.    Consultants:  PCCM Nephrology Cardiology Vascular surgery  Procedures:  Intubated/extubated on 05/01/2017 through 05/15/2017 IVC filter placement 05/10/2017 Trach placed on 05/15/2017 PEG placed 05/16/2017 Bilateral AKA 05/24/2017  Antimicrobials:  None currently    Objective: Vitals:   06/03/17 0311 06/03/17 0418 06/03/17 0732 06/03/17 0745  BP:  133/77  106/71  Pulse: 81 95 87 86  Resp: 15 (!) _0 Temp:  99.1 F (37.3 C)  99.6 F (37.6 C)  TempSrc:  Oral  Oral  SpO2: 100% 99% 100%   Weight:      Height:        Intake/Output Summary (Last 24 hours) at 06/03/2017 1006 Last data filed at 06/03/2017 0900 Gross per 24 hour  Intake 1430 ml  Output  1326 ml  Net 104 ml   Filed Weights   05/30/17 0313 05/31/17 0300  Weight: 83 kg (183 lb) 84.4 kg (186 lb)    Examination: General exam: Appears calm and comfortable  Respiratory system: Clear to auscultation. Respiratory effort normal. On trach collar  Cardiovascular system: S1 & S2 heard, RRR. No JVD, murmurs, rubs, gallops or clicks. No pedal edema. Gastrointestinal system: Abdomen is nondistended, soft and nontender. No organomegaly or masses felt. Normal bowel sounds heard. Central nervous system: Alert but not oriented to place or year [facility]" [facility]1919" Extremities: Bilateral AKA  Skin: No rashes, lesions or ulcers    Data Reviewed: I have personally reviewed following labs and imaging studies  CBC: Recent Labs  Lab 05/30/17 0316 05/31/17 0610 06/01/17 0306 06/02/17 0228 06/03/17 0314  WBC 25.7* 24.2* 21.6* 19.3* 16.8*  HGB 8.3* 7.8* 8.1* 8.1* 7.6*  HCT 25.5* 24.4* 25.4* 25.9* 23.9*  MCV 87.6 89.4 89.4 89.9 90.5  PLT 333 379 404* 439* 161*   Basic Metabolic Panel: Recent Labs  Lab 05/28/17 0228 05/29/17 0250 05/30/17 0316 05/31/17 0610 06/01/17 0306 06/02/17 0228 06/03/17 0314  NA 139 138 139 139 139 140 137  K 4.2 4.0 4.2 4.1 3.9 4.0 4.2  CL 105 105 106 106 105 105 104  CO2 _1 GLUCOSE 125* 144* 166* 142* 140*  139* 153*  BUN 34* 28* 27* 26* 24* 22* 19  CREATININE 1.41* 1.19 1.10 1.07 1.00 1.08 0.99  CALCIUM 7.7* 7.7* 7.8* 8.0* 8.0* 8.2* 8.1*  MG  --  1.7  --   --   --   --   --   PHOS 3.4 3.0  --   --   --   --   --    GFR: Estimated Creatinine Clearance: 75.8 mL/min (by C-G formula based on SCr of 0.99 mg/dL). Liver Function Tests: Recent Labs  Lab 05/28/17 0228 05/29/17 0250  ALBUMIN 1.7* 1.5*   No results for input(s): LIPASE, AMYLASE in the last 168 hours. No results for input(s): AMMONIA in the last 168 hours. Coagulation Profile: No results for input(s): INR, PROTIME in the last 168 hours. Cardiac  Enzymes: No results for input(s): CKTOTAL, CKMB, CKMBINDEX, TROPONINI in the last 168 hours. BNP (last 3 results) No results for input(s): PROBNP in the last 8760 hours. HbA1C: No results for input(s): HGBA1C in the last 72 hours. CBG: Recent Labs  Lab 05/28/17 2310 05/29/17 0325 05/29/17 0834 05/29/17 1315 05/29/17 1712  GLUCAP 148* 131* 144* 106* 130*   Lipid Profile: No results for input(s): CHOL, HDL, LDLCALC, TRIG, CHOLHDL, LDLDIRECT in the last 72 hours. Thyroid Function Tests: No results for input(s): TSH, T4TOTAL, FREET4, T3FREE, THYROIDAB in the last 72 hours. Anemia Panel: No results for input(s): VITAMINB12, FOLATE, FERRITIN, TIBC, IRON, RETICCTPCT in the last 72 hours. Sepsis Labs: No results for input(s): PROCALCITON, LATICACIDVEN in the last 168 hours.  Recent Results (from the past 240 hour(s))  Urine Culture     Status: Abnormal   Collection Time: 05/25/17 10:10 AM  Result Value Ref Range Status   Specimen Description URINE, CLEAN CATCH  Final   Special Requests   Final    NONE Performed at [facility], [address] [place], [place] [address]    Culture >=100,000 COLONIES/mL PSEUDOMONAS AERUGINOSA (A)  Final   Report Status 05/28/2017 FINAL  Final   Organism ID, Bacteria PSEUDOMONAS AERUGINOSA (A)  Final      Susceptibility   Pseudomonas aeruginosa - MIC*    CEFTAZIDIME 2 SENSITIVE Sensitive     CIPROFLOXACIN <=0.25 SENSITIVE Sensitive     GENTAMICIN <=1 SENSITIVE Sensitive     IMIPENEM >=16 RESISTANT Resistant     PIP/TAZO 8 SENSITIVE Sensitive     CEFEPIME <=1 SENSITIVE Sensitive     * >=100,000 COLONIES/mL PSEUDOMONAS AERUGINOSA  Culture, blood (routine x 2)     Status: None   Collection Time: 05/25/17 10:18 AM  Result Value Ref Range Status   Specimen Description BLOOD LEFT HAND  Final   Special Requests   Final    BOTTLES DRAWN AEROBIC AND ANAEROBIC Blood Culture adequate volume   Culture   Final    NO GROWTH 5 DAYS Performed at  [facility], [address] [place], [place] [address]    Report Status 05/30/2017 FINAL  Final  Culture, blood (routine x 2)     Status: None   Collection Time: 05/25/17 10:32 AM  Result Value Ref Range Status   Specimen Description BLOOD RIGHT HAND  Final   Special Requests   Final    BOTTLES DRAWN AEROBIC AND ANAEROBIC Blood Culture adequate volume   Culture   Final    NO GROWTH 5 DAYS Performed at [facility], [place] [address] [place], [place] [address]    Report Status 05/30/2017  FINAL  Final       Radiology Studies: No results found.    Scheduled Meds: . apixaban  5 mg Oral BID  . aspirin  81 mg Per Tube Daily  . chlorhexidine gluconate (MEDLINE KIT)  15 mL Mouth Rinse BID  . collagenase   Topical Daily  . feeding supplement (PRO-STAT SUGAR FREE 64)  30 mL Per Tube BID  . free water  250 mL Per Tube Q8H  . haloperidol  1 mg Oral Daily  . haloperidol  2 mg Oral QHS  . mouth rinse  15 mL Mouth Rinse QID  . Melatonin  6 mg Oral QHS  . multivitamin  15 mL Oral Daily  . pantoprazole sodium  40 mg Per Tube QHS  . thiamine  100 mg Per Tube Daily   Continuous Infusions: . feeding supplement (JEVITY 1.5 CAL/FIBER) Stopped (06/03/17 0934)  . magnesium sulfate 1 - 4 g bolus IVPB    . piperacillin-tazobactam (ZOSYN)  IV Stopped (06/03/17 0934)     LOS: 33 days    Time spent: 25 minutes   Dessa Phi, DO [facility] [email or web address] Password [email or web address] 06/03/2017, 10:06 AM

## 2017-06-03 NOTE — Progress Notes (Addendum)
  Progress Note  VASCULAR SURGERY ASSESSMENT & PLAN:   I have removed a staple last week and drained hematoma from the right AKA.  He continues to have some old bloody drainage from this wound.  I have written dressing changes to pack this wound daily and as needed with wet-to-dry 4 x 4 with normal saline.  There is some drainage on the left AKA also.  If this persists we may need to removal staple on this side and probed this with a Q-tip to make sure there is no hematoma on the left.  He will need continued aggressive wound care.  Waverly Ferrarihristopher Jaileen Janelle, MD, FACS Beeper [phone] Office: [phone]   06/03/2017 9:12 AM 10 Days Post-Op  Subjective:  Says he's not having much pain in his legs today  Tm 99.6  Vitals:   06/03/17 0732 06/03/17 0745  BP:  106/71  Pulse: 87 86  Resp: 20 16  Temp:  99.6 F (37.6 C)  SpO2: 100%     Physical Exam: Incisions:  Bilateral amp sites are intact with staples.  The right stump had a staple removed over the weekend and there is still some bloody drainage from this area.  RN reports this has decreased.  Drainage does not appear infectious.  Mild bloody drainage from left stump as well.   CBC    Component Value Date/Time   WBC 16.8 (H) 06/03/2017 0314   RBC 2.64 (L) 06/03/2017 0314   HGB 7.6 (L) 06/03/2017 0314   HCT 23.9 (L) 06/03/2017 0314   PLT 411 (H) 06/03/2017 0314   MCV 90.5 06/03/2017 0314   MCH 28.8 06/03/2017 0314   MCHC 31.8 06/03/2017 0314   RDW 17.7 (H) 06/03/2017 0314   LYMPHSABS 1.7 05/17/2017 2300   MONOABS 0.3 05/17/2017 2300   EOSABS 1.1 (H) 05/17/2017 2300   BASOSABS 0.0 05/17/2017 2300    BMET    Component Value Date/Time   NA 137 06/03/2017 0314   K 4.2 06/03/2017 0314   CL 104 06/03/2017 0314   CO2 24 06/03/2017 0314   GLUCOSE 153 (H) 06/03/2017 0314   BUN 19 06/03/2017 0314   CREATININE 0.99 06/03/2017 0314   CALCIUM 8.1 (L) 06/03/2017 0314   GFRNONAA >60 06/03/2017 0314   GFRAA >60 06/03/2017 0314     INR    Component Value Date/Time   INR 1.21 05/10/2017 0650     Intake/Output Summary (Last 24 hours) at 06/03/2017 0912 Last data filed at 06/03/2017 0617 Gross per 24 hour  Intake 1190 ml  Output 875 ml  Net 315 ml     Assessment/Plan:  67 y.o. male is s/p Bilateral above knee amputations   10 Days Post-Op  -pt's bilateral AKA stumps look good overall.  There is still some bloody drainage from both right > left but neither appear infectious.  Continue with dry non adherent dressings.   -Hgb down slightly from yesterday-transfuse per primary team. -leukocytosis is improving-pt still with low grade fever    Doreatha MassedSamantha Rhyne, PA-C [facility] [phone] 06/03/2017 9:12 AM

## 2017-06-03 NOTE — Clinical Social Work Note (Addendum)
CSW is working on placing pt at a SNF. Pt's sister is to come collect completed medicare pt B ppwk today to take to social services.. Pt's sister to be applying pt for Medicaid.   WiltonBridget Cecilie Heidel, [place] [phone]

## 2017-06-04 ENCOUNTER — Inpatient Hospital Stay (HOSPITAL_COMMUNITY): Payer: Medicare Other

## 2017-06-04 LAB — BASIC METABOLIC PANEL
Anion gap: 9 (ref 5–15)
BUN: 18 mg/dL (ref 6–20)
CO2: 24 mmol/L (ref 22–32)
Calcium: 8.1 mg/dL — ABNORMAL LOW (ref 8.9–10.3)
Chloride: 102 mmol/L (ref 101–111)
Creatinine, Ser: 0.93 mg/dL (ref 0.61–1.24)
GFR calc Af Amer: >60 mL/min (ref >60)
GLUCOSE: 153 mg/dL — AB (ref 65–99)
POTASSIUM: 4.7 mmol/L (ref 3.5–5.1)
Sodium: 135 mmol/L (ref 135–145)

## 2017-06-04 LAB — CBC
HCT: 27 % — ABNORMAL LOW (ref 39.0–52.0)
Hemoglobin: 8.4 g/dL — ABNORMAL LOW (ref 13.0–17.0)
MCH: 28.4 pg (ref 26.0–34.0)
MCHC: 31.1 g/dL (ref 30.0–36.0)
MCV: 91.2 fL (ref 78.0–100.0)
PLATELETS: 531 10*3/uL — AB (ref 150–400)
RBC: 2.96 MIL/uL — AB (ref 4.22–5.81)
RDW: 17.6 % — AB (ref 11.5–15.5)
WBC: 22.3 10*3/uL — ABNORMAL HIGH (ref 4.0–10.5)

## 2017-06-04 MED ORDER — ENSURE ENLIVE PO LIQD
237.0000 mL | Freq: Two times a day (BID) | ORAL | Status: DC
  Administered 2017-06-04 – 2017-06-07 (×4): 237 mL via ORAL

## 2017-06-04 NOTE — Progress Notes (Signed)
Occupational Therapy Treatment Patient Details Name: Joseph Downs MRN: [id number] DOB: 1950/09/06 Today's Date: 06/04/2017    History of present illness Pt is a 67 year old man who was found down, unresponsive and hypothermic on 05/01/17. Developed respiratory failure and intubated 2/13. Pt also with encephalopathy, shock, NSTEMI, acute renal failure, bilateral DVTs, massive PE, Bil DVTs, massive PE, bactermeia. IVC filter placed 2/22. Extubated 2/26, but re-intubated later that day. Trach placed 2/27. Peg placed 2/28. CT 3/7 negative for acute intracranial abnormality. 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 No medical history on file.   OT comments  Pt demonstrating progress toward OT goals this session. He was able to progress to OOB mobility for simulated toilet transfers with anterior-posterior transfer with max assist +3 this session. Pt limited by decreased cognition and difficulty following commands and this impacted his ability to assist with transfer thsi session. Pt with episode of bowel incontinence and requiring total assist to complete hygiene. Noted pt maintaining BLE tightly adducted impacting ability to complete hygiene tasks today. D/C recommendation remains appropriate. Will continue to follow while admitted.   Follow Up Recommendations  SNF;Supervision/Assistance - 24 hour    Equipment Recommendations       Recommendations for Other Services      Precautions / Restrictions Precautions Precautions: Fall Precaution Comments: Bilat AKAs, sacral wound, trach (PMV), PEG Required Braces or Orthoses: Other Brace/Splint Restrictions Weight Bearing Restrictions: Yes RLE Weight Bearing: Non weight bearing LLE Weight Bearing: Non weight bearing       Mobility Bed Mobility Overal bed mobility: Needs Assistance Bed Mobility: Rolling;Supine to Sit;Sit to Supine Rolling: Min assist;Max assist         General bed mobility comments: MinA to roll onto L-side with use of bed rail;  maxA to roll onto R-side with use of bed rail. Pt incontinent of bowel and dependent for pericare. Able to long sit with maxA+2 for UE support and trunk elevation  Transfers Overall transfer level: Needs assistance Equipment used: None Transfers: Licensed conveyancerAnterior-Posterior Transfer       Anterior-Posterior transfers: Max assist;+2 physical assistance   General transfer comment: MaxA+2 with use of pad to perform anterior-posterior transfer from bed to recliner. Pt able to assist minimally with BUE support, but decreased cognition/command-following limiting this    Balance Overall balance assessment: Needs assistance Sitting-balance support: Bilateral upper extremity supported;Feet unsupported Sitting balance-Leahy Scale: Poor Sitting balance - Comments: Reliant on external support Postural control: Posterior lean                                 ADL either performed or assessed with clinical judgement   ADL Overall ADL's : Needs assistance/impaired                         Toilet Transfer: Anterior/posterior;Maximal assistance;+2 for physical assistance(+3 assist)   Toileting- Clothing Manipulation and Hygiene: Total assistance;Bed level Toileting - Clothing Manipulation Details (indicate cue type and reason): pt with bowel incontinence in bed and not aware that this had occurred       General ADL Comments: Once long sitting in bed, pt requiring cues and assistance to place hands on bed to support self. Pt maintains tight adduction of B LE impacting toileting hygiene and ADL participation.      Vision       Perception     Praxis      Cognition Arousal/Alertness: Awake/alert Behavior  During Therapy: WFL for tasks assessed/performed Overall Cognitive Status: Impaired/Different from baseline Area of Impairment: Attention;Following commands;Safety/judgement;Awareness;Problem solving                 Orientation Level: Disoriented  to;Time;Situation Current Attention Level: Sustained   Following Commands: Follows one step commands with increased time;Follows one step commands inconsistently;Follows multi-step commands inconsistently Safety/Judgement: Decreased awareness of deficits;Decreased awareness of safety Awareness: Intellectual Problem Solving: Slow processing;Decreased initiation;Difficulty sequencing;Requires verbal cues General Comments: Pt more appropriately interactive this session; speaking well with PMV. Continues to demonstrate decreased awareness and command following, requiring repeated cues for sequencing. Specific difficulty noted when attempting to follow commands to place hands on bed to assist therapists with repositioning and anterior-posterior transfer.        Exercises     Shoulder Instructions       General Comments SpO2 stable with trach collar at 28% FiO2. Pt with bowel incontinence requiring sacral and BLE dressing change; RN notified.    Pertinent Vitals/ Pain       Pain Assessment: Faces Faces Pain Scale: Hurts little more Pain Location: Sacral wound with repositioning Pain Descriptors / Indicators: Grimacing;Guarding;Sore Pain Intervention(s): Monitored during session;Repositioned  Home Living                                          Prior Functioning/Environment              Frequency  Min 2X/week        Progress Toward Goals  OT Goals(current goals can now be found in the care plan section)  Progress towards OT goals: Progressing toward goals  Acute Rehab OT Goals Patient Stated Goal: Decreased pain OT Goal Formulation: With patient Time For Goal Achievement: 06/04/17 Potential to Achieve Goals: Fair  Plan Discharge plan needs to be updated    Co-evaluation    PT/OT/SLP Co-Evaluation/Treatment: Yes Reason for Co-Treatment: For patient/therapist safety   OT goals addressed during session: Strengthening/ROM;ADL's and self-care       AM-PAC PT "6 Clicks" Daily Activity     Outcome Measure   Help from another person eating meals?: A Lot Help from another person taking care of personal grooming?: A Lot Help from another person toileting, which includes using toliet, bedpan, or urinal?: Total Help from another person bathing (including washing, rinsing, drying)?: Total Help from another person to put on and taking off regular upper body clothing?: A Lot Help from another person to put on and taking off regular lower body clothing?: Total 6 Click Score: 9    End of Session Equipment Utilized During Treatment: Oxygen  OT Visit Diagnosis: Pain;Muscle weakness (generalized) (M62.81);Other symptoms and signs involving cognitive function Pain - Right/Left: (bilateral) Pain - part of body: (residual limbs)   Activity Tolerance Patient limited by pain   Patient Left in bed;with bed alarm set;with call bell/phone within reach   Nurse Communication Mobility status(needs dressing change secondary to bowel incontinence)        Time: 1610-9604 OT Time Calculation (min): 32 min  Charges: OT General Charges $OT Visit: 1 Visit OT Treatments $Self Care/Home Management : 8-22 mins  Doristine Section, MS OTR/L  Pager: [phone]    Joseph Downs A Joseph Downs 06/04/2017, 5:28 PM

## 2017-06-04 NOTE — Progress Notes (Signed)
Nutrition Follow-up  DOCUMENTATION CODES:   Non-severe (moderate) malnutrition in context of social or environmental circumstances  INTERVENTION:   -Continue nocturnal feedings: Jevity 1.5@ 3155ml/hr via PEGover 12 hour period  -Continue 30 ml Prostat BID  Tube feeding regimen provides1090kcal (54% of needs),57grams of protein, and 502ml of H2O. (Total free water: 1252 ml)  -Continue 15 ml liquid MVI daily via PEG  -Ensure Enlive po BID, each supplement provides 350 kcal and 20 grams of protein  NUTRITION DIAGNOSIS:   Moderate Malnutrition related to social / environmental circumstances as evidenced by mild fat depletion, mild muscle depletion.  Ongoing  GOAL:   Patient will meet greater than or equal to 90% of their needs  Progressing  MONITOR:   Diet advancement, Labs, Weight trends, Skin, I & O's, TF tolerance  REASON FOR ASSESSMENT:   Consult Enteral/tube feeding initiation and management  ASSESSMENT:   67 yo male with no PMH who was admitted on 2/13 after being found unresponsive outside by police with hypothermia, hypotension, elevated lactic acid. Required intubation on admission. Found to have extensive bilateral DVTs.  11/27- s/p trach 2/28- s/p PEG placement 3/6- transferred from ICU to SDU 3/8- s/p bilateral AKA 3/11- s/p FEES- advanced to a dysphagia 1 diet with honey thick liquids 3/17- pulled out trach; replaced 3/18- s/p BSE- advanced to dysphagia 3 diet with nectar thick liquids 3/19- s/p FEES- advanced to dysphagia 3 diet with thin liquids  Spoke with RN, who reports pt's intake is best in the morning, but tires out throughout the day. Pt is fed by staff and needs encouragement to eat. He was requesting ice cream and sweets last night. Pt consuming 20-50% of meals.   Spoke with pt at bedside, who remains trach collar. Pt is more alert and interactive from previous visit. Pt reports he is excited to eat solid foods. Discussed importance  of good meal and supplement intake to promote healing.   Pt continues on nocturnal feedings- Jevity 1.5 @ 55 ml/hr via PEG with 30 ml Prostat BID and 15 ml liquids MVI daily. Tube feeding regimen provides1090kcal (54% of needs),57grams of protein, and 502ml of H2O. (Total free water: 1252 ml)  Per CSW notes, pt family obtaining Medicare application. Plan to d/c to SNF once medically stable.   Labs reviewed.   Diet Order:  DIET DYS 3 Room service appropriate? Yes; Fluid consistency: Thin  EDUCATION NEEDS:   No education needs have been identified at this time  Skin:  Skin Assessment: Skin Integrity Issues: Skin Integrity Issues:: Stage II, Unstageable Stage II: worsening- advanced to unstageable Unstageable: sacrum Other: bilateral AKA incisions  Last BM:  06/03/17  Height:   Ht Readings from Last 1 Encounters:  05/14/17 5\' 10"  (1.778 m)    Weight:   Wt Readings from Last 1 Encounters:  06/04/17 218 lb (98.9 kg)    Ideal Body Weight:  63.4 kg  BMI:  Body mass index is 31.28 kg/m.  Estimated Nutritional Needs:   Kcal:  2000-2200  Protein:  110-125 grams  Fluid:  >2.0 L    Joseph Downs, RD, LDN, CDE Pager: [phone] After hours Pager: [phone]

## 2017-06-04 NOTE — Progress Notes (Signed)
Called chaplain to do advance directives. Pt stated that he wants sister present when signing paperwork. Called carolyn notified will be here at 4pm after her doctor's appt. Will notify chaplain tomorrow when arrives.

## 2017-06-04 NOTE — Progress Notes (Signed)
PT Cancellation Note  Patient Details Name: Joseph FloridaLarry E Nishiyama MRN: [id number] DOB: 1950/04/20   Cancelled Treatment:    Reason Eval/Treat Not Completed: Patient at procedure or test/unavailable. Pt going for MBS. Will follow-up for PT treatment per plan of care.  Ina HomesJaclyn Nashya Garlington, PT, DPT Acute Rehab Services  Pager: [phone]  Malachy ChamberJaclyn L Khayden Herzberg 06/04/2017, 9:39 AM

## 2017-06-04 NOTE — Progress Notes (Addendum)
  Progress Note    06/04/2017 11:57 AM 11 Days Post-Op  Subjective:  No complaints from patient during exam   Vitals:   06/04/17 1047 06/04/17 1105  BP: (!) 144/123   Pulse: 94 84  Resp: (!) 21 14  Temp: 98.7 F (37.1 C)   SpO2: 100% 100%    Physical Exam: Incisions:  R BKA dressing taken down, no active bleeding; L AKA dressing taken down, area of oozing in mid-medial incision probed but no hematoma cavity noted   CBC    Component Value Date/Time   WBC 22.3 (H) 06/04/2017 0812   RBC 2.96 (L) 06/04/2017 0812   HGB 8.4 (L) 06/04/2017 0812   HCT 27.0 (L) 06/04/2017 0812   PLT 531 (H) 06/04/2017 0812   MCV 91.2 06/04/2017 0812   MCH 28.4 06/04/2017 0812   MCHC 31.1 06/04/2017 0812   RDW 17.6 (H) 06/04/2017 0812   LYMPHSABS 1.7 05/17/2017 2300   MONOABS 0.3 05/17/2017 2300   EOSABS 1.1 (H) 05/17/2017 2300   BASOSABS 0.0 05/17/2017 2300    BMET    Component Value Date/Time   NA 135 06/04/2017 0812   K 4.7 06/04/2017 0812   CL 102 06/04/2017 0812   CO2 24 06/04/2017 0812   GLUCOSE 153 (H) 06/04/2017 0812   BUN 18 06/04/2017 0812   CREATININE 0.93 06/04/2017 0812   CALCIUM 8.1 (L) 06/04/2017 0812   GFRNONAA >60 06/04/2017 0812   GFRAA >60 06/04/2017 0812    INR    Component Value Date/Time   INR 1.21 05/10/2017 0650     Intake/Output Summary (Last 24 hours) at 06/04/2017 1157 Last data filed at 06/04/2017 1000 Gross per 24 hour  Intake 1140 ml  Output 1975 ml  Net -835 ml     Assessment/Plan:  67 y.o. male is s/p bilaterally above knee amputation  11 Days Post-Op  - Wet to dry packing R AKA stump; continue dressing changes daily - No hematoma cavity found with probe in area of continued draining L AKA stump, nonadherent dressing reapplied Vascular will continue daily wound care   Emilie RutterMatthew Eveland, PA-C [facility] [phone] 06/04/2017 11:57 AM  I have interviewed the patient and examined the patient. I agree with the  findings by the PA.  Cari Carawayhris Dickson, MD [phone]

## 2017-06-04 NOTE — Plan of Care (Signed)
Continue current care plan, sacral wound done with santyl

## 2017-06-04 NOTE — Clinical Social Work Note (Signed)
CSW faxed Community Living Screening Checklist [facility] to the [facility].  Charlynn CourtSarah Andra Matsuo, CSW [phone]

## 2017-06-04 NOTE — Progress Notes (Signed)
   06/04/17 1448  Clinical Encounter Type  Visited With Patient;Health care provider  Visit Type Initial  Referral From Patient;Family;Nurse  Consult/Referral To Chaplain   Responded to a SCC for HCPA.  Notes indicated mornings were better for the patient.  Nurse paged to say family wanted it to be completed.  Went and visited with the patient and Okey RegalCarol the nurse.  Patient indicated he would like his sister present. Okey RegalCarol called the sister and she will be in tomorrow and we will complete then.  Will follow and support as needed.  Chaplain Agustin CreeNewton Loza Prell

## 2017-06-04 NOTE — Progress Notes (Addendum)
PT working w/  Pt currently, [facility] clear, found pt w/ PMV currently on.  Pt states he does not need suctioning at this time.  No distress noted, VSS.

## 2017-06-04 NOTE — Progress Notes (Signed)
Modified Barium Swallow Progress Note  Patient Details  Name: Joseph Downs MRN: [id number] Date of Birth: [birth year]  Today's Date: 06/04/2017  Modified Barium Swallow completed.  Full report located under Chart Review in the Imaging Section.  Brief recommendations include the following:  Clinical Impression  Significant improvements compared to FEES one week ago. Pt's alertness and awareness have improved and trach now cuffless tolerating PMV (donned during this study). No laryngeal penetration or aspiration obseved and mild intermittent vallecular and pyriform sinus residue without sensation. Verbal cue for additional swallow cleared pharyngeal residue. Protective mechanisms and timng of swallow appropriate. Mastication was rapid and possibly mildly incomplete. Aspiration risk appears mild and recommend Dys 3, thin, straws allowed, pills with water, donn speaking valve during meals and ensure adequate positioning.      Swallow Evaluation Recommendations       SLP Diet Recommendations: Dysphagia 3 (Mech soft) solids;Thin liquid   Liquid Administration via: Cup;Straw   Medication Administration: Whole meds with liquid   Supervision: Patient able to self feed;Full supervision/cueing for compensatory strategies   Compensations: Small sips/bites;Slow rate;Clear throat intermittently(place speaking valve)   Postural Changes: Seated upright at 90 degrees   Oral Care Recommendations: Oral care BID        [name], Abbigail Anstey Willis 06/04/2017,11:23 AM   Breck CoonsLisa Willis Lonell FaceLitaker M.Ed [employer]SLP Pager [phone]

## 2017-06-04 NOTE — Progress Notes (Addendum)
PROGRESS NOTE    Joseph Downs  OZH:[id number] DOB: December 14, 1950 DOA: 05/01/2017 PCP: Patient, No Pcp Per     Brief Narrative:  Joseph Downs is a 67 yo M, reportedly homeless, no known past medical history other than obstructive uropathy requiring foley. Evidently, on 2/13, he was found down in the cold. He was brought to the ER, hypotensive, had lactate >7 despite 30 cc/kg IVF and re-warming. He was admitted to ICU, at that time was mentating enough to explain that he had passed out, not clear why. That night, he developed respiratory distress requiring intubation. He was treated with broad spectrum antibiotics for septic shock with hypotension, tachycardia and leukocytosis. He had renal failure, creatinine up to 7 and was started on CVVHD. Blood cultures grew Ac[place]o lwoffii, urine cx frew staph lugdunensis. Echo showed severe RV strain and bilateral LE dopplers showed extensive DVT. CTA was not possible, nor catheterization/lysis due to AKI. He was started on heparin drip. At time of transfer out of ICU to [facility] service, patient has tolerated trach collar. He has PEG. Cardiology, Nephrology, Vascular surgery, and Pulm were consulted. He is s/p B/L AKA due to frostbite.   3/16: No acute events. Asking if he can sit up in a chair because it has been uncomfortable in bed. No complaints of pain.   Assessment & Plan:   Principal Problem:   Sepsis ([place]) Active Problems:   Lactic acidemia   Acute respiratory failure (HCC)   Exposure to weather condition   Hypotension   Non-ST elevation (NSTEMI) myocardial infarction (HCC)   Malnutrition of moderate degree   Acute cystitis with hematuria   Acute deep vein thrombosis (DVT) of proximal vein of both lower extremities (HCC)   Pressure injury of skin   Cardiogenic shock (HCC)   Acute cor pulmonale (HCC)   Acute respiratory failure with hypoxemia (HCC)   Deep vein thrombosis (DVT) of non-extremity vein   SIRS (systemic inflammatory response  syndrome) (HCC)   S/P AKA (above knee amputation) bilateral (HCC)   Hypernatremia   Acute blood loss anemia   Encephalopathy   Postoperative pain   Pulmonary embolus (HCC)   Tracheostomy status (HCC)   Sepsis secondary to Pseudomonas UTI -Overnight on 3/8-11/2017, patient developed fever, leukocytosis, tachycardia, tachypnea -CXR reviewed and unremarkable for infection -UA: Few bacteria, 0-5 WBC, positive nitrates, trace leukocytes. Urine culture positive for pseudomonas, sensitive to cephalosporin, cipro, zosyn -Blood cultures from 05/25/2017 show no growth to date -Completed 7 days of zosyn  -WBC had been trending downward, today it is 22.3 (also increase in Hgb and platelets as well, ?hemoconcentration). Continue to monitor. He has remained afebrile  Acute blood loss anemia/Normocytic Anemia -S/p bilateral AKA on 05/24/2017 -Transfused 2u PRBC on 05/27/2017 -Hemoglobin has been in the 7.6-8.8 range. Monitor.   Acute respiratory failure -PCCM follows as patient has trach collar -FEES done on 05/27/2017 and patient was placed on dysphagia 1 diet -Currently on cuffless 6. May discharge to trach SNF and follow up in trach clinic. Spoke with Dr. Nelda Marseille PCCM 3/18; plan to discharge to SNF without downsizing at this time.   Presumed massive pulmonary embolism/Bilateral DVT -Status post IVC filter, placed due to doubts of patient's future compliance with oral anticoagulation  -Continue Eliquis   Acute kidney injury -History of recent obstructive uropathy. ?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/urine culture)- needs an outpatient work up -He did have a Foley catheter previous hospitalization -Upon admission, creatinine was 2.5, however  peaked at 7.51 -Nephrology consulted and appreciated and is now signed off -Resolved   Bacteremia -Cultures from 05/01/2017 showed diphtheroids, Acinetobacter lwoffii -Repeat blood cultures show no growth -Patient was  placed on vancomycin and Zosyn however then transitioned to meropenem and completed course -Resolved   Hypernatremia -Resolved. Continue free water   Acute metabolic encephalopathy -Avoid benzodiazepines -Continue Haldol -Stable   NSTEMI -Cardiology consulted and appreciated, continue aspirin -Possibly related to PE  Presumed frostbite/Gangrene of feet -Vascular surgery consulted and appreciated -Wound care consulted -S/p bilateral AKA 05/24/2017   DVT prophylaxis: Eliquis  Code Status: Full Family Communication: No family at bedside, spoke with sister 3/18 over the phone.  Disposition Plan: SNF. SW working on placement.    Consultants:  PCCM Nephrology Cardiology Vascular surgery  Procedures:  Intubated/extubated on 05/01/2017 through 05/15/2017 IVC filter placement 05/10/2017 Trach placed on 05/15/2017 PEG placed 05/16/2017 Bilateral AKA 05/24/2017  Antimicrobials:  None currently    Objective: Vitals:   06/04/17 0254 06/04/17 0303 06/04/17 0450 06/04/17 0735  BP: 135/89   103/85  Pulse: 96 99  96  Resp: (!) 21 19  16   Temp: 99.3 F (37.4 C)   98.6 F (37 C)  TempSrc: Oral   Oral  SpO2: 98% 99%  100%  Weight:   98.9 kg (218 lb)   Height:        Intake/Output Summary (Last 24 hours) at 06/04/2017 1001 Last data filed at 06/04/2017 0600 Gross per 24 hour  Intake 1080 ml  Output 1975 ml  Net -895 ml   Filed Weights   05/31/17 0300 06/04/17 0450  Weight: 84.4 kg (186 lb) 98.9 kg (218 lb)    Examination: General exam: Appears calm and comfortable  Respiratory system: Clear to auscultation. Respiratory effort normal. On trach collar  Cardiovascular system: S1 & S2 heard, RRR. No JVD, murmurs, rubs, gallops or clicks. No pedal edema. Gastrointestinal system: Abdomen is nondistended, soft and nontender. No organomegaly or masses felt. Normal bowel sounds heard. Central nervous system: Alert but not oriented to place or year "[facility]"  "1919" Extremities: Bilateral AKA  Skin: No rashes, lesions or ulcers  Examination: General exam: Appears calm and comfortable  Respiratory system: Clear to auscultation. Respiratory effort normal. On trach collar  Cardiovascular system: S1 & S2 heard, RRR. No JVD, murmurs, rubs, gallops or clicks. No pedal edema. Gastrointestinal system: Abdomen is nondistended, soft and nontender. No organomegaly or masses felt. Normal bowel sounds heard. Central nervous system: Alert and oriented to "[facility] hospital" and year 2019.  Extremities: Bilateral AKA  Data Reviewed: I have personally reviewed following labs and imaging studies  CBC: Recent Labs  Lab 05/31/17 0610 06/01/17 0306 06/02/17 0228 06/03/17 0314 06/04/17 0812  WBC 24.2* 21.6* 19.3* 16.8* 22.3*  HGB 7.8* 8.1* 8.1* 7.6* 8.4*  HCT 24.4* 25.4* 25.9* 23.9* 27.0*  MCV 89.4 89.4 89.9 90.5 91.2  PLT 379 404* 439* 411* 037*   Basic Metabolic Panel: Recent Labs  Lab 05/29/17 0250  05/31/17 0610 06/01/17 0306 06/02/17 0228 06/03/17 0314 06/04/17 0812  NA 138   < > 139 139 140 137 135  K 4.0   < > 4.1 3.9 4.0 4.2 4.7  CL 105   < > 106 105 105 104 102  CO2 23   < > 24 25 25 24 24   GLUCOSE 144*   < > 142* 140* 139* 153* 153*  BUN 28*   < > 26* 24* 22* 19 18  CREATININE  1.19   < > 1.07 1.00 1.08 0.99 0.93  CALCIUM 7.7*   < > 8.0* 8.0* 8.2* 8.1* 8.1*  MG 1.7  --   --   --   --   --   --   PHOS 3.0  --   --   --   --   --   --    < > = values in this interval not displayed.   GFR: Estimated Creatinine Clearance: 92.2 mL/min (by C-G formula based on SCr of 0.93 mg/dL). Liver Function Tests: Recent Labs  Lab 05/29/17 0250  ALBUMIN 1.5*   No results for input(s): LIPASE, AMYLASE in the last 168 hours. No results for input(s): AMMONIA in the last 168 hours. Coagulation Profile: No results for input(s): INR, PROTIME in the last 168 hours. Cardiac Enzymes: No results for input(s): CKTOTAL, CKMB, CKMBINDEX,  TROPONINI in the last 168 hours. BNP (last 3 results) No results for input(s): PROBNP in the last 8760 hours. HbA1C: No results for input(s): HGBA1C in the last 72 hours. CBG: Recent Labs  Lab 05/28/17 2310 05/29/17 0325 05/29/17 0834 05/29/17 1315 05/29/17 1712  GLUCAP 148* 131* 144* 106* 130*   Lipid Profile: No results for input(s): CHOL, HDL, LDLCALC, TRIG, CHOLHDL, LDLDIRECT in the last 72 hours. Thyroid Function Tests: No results for input(s): TSH, T4TOTAL, FREET4, T3FREE, THYROIDAB in the last 72 hours. Anemia Panel: No results for input(s): VITAMINB12, FOLATE, FERRITIN, TIBC, IRON, RETICCTPCT in the last 72 hours. Sepsis Labs: No results for input(s): PROCALCITON, LATICACIDVEN in the last 168 hours.  Recent Results (from the past 240 hour(s))  Urine Culture     Status: Abnormal   Collection Time: 05/25/17 10:10 AM  Result Value Ref Range Status   Specimen Description URINE, CLEAN CATCH  Final   Special Requests   Final    NONE Performed at [facility], [address] [place], [place] [address]    Culture >=100,000 COLONIES/mL PSEUDOMONAS AERUGINOSA (A)  Final   Report Status 05/28/2017 FINAL  Final   Organism ID, Bacteria PSEUDOMONAS AERUGINOSA (A)  Final      Susceptibility   Pseudomonas aeruginosa - MIC*    CEFTAZIDIME 2 SENSITIVE Sensitive     CIPROFLOXACIN <=0.25 SENSITIVE Sensitive     GENTAMICIN <=1 SENSITIVE Sensitive     IMIPENEM >=16 RESISTANT Resistant     PIP/TAZO 8 SENSITIVE Sensitive     CEFEPIME <=1 SENSITIVE Sensitive     * >=100,000 COLONIES/mL PSEUDOMONAS AERUGINOSA  Culture, blood (routine x 2)     Status: None   Collection Time: 05/25/17 10:18 AM  Result Value Ref Range Status   Specimen Description BLOOD LEFT HAND  Final   Special Requests   Final    BOTTLES DRAWN AEROBIC AND ANAEROBIC Blood Culture adequate volume   Culture   Final    NO GROWTH 5 DAYS Performed at [facility], [address], [place], [place] [address]     Report Status 05/30/2017 FINAL  Final  Culture, blood (routine x 2)     Status: None   Collection Time: 05/25/17 10:32 AM  Result Value Ref Range Status   Specimen Description BLOOD RIGHT HAND  Final   Special Requests   Final    BOTTLES DRAWN AEROBIC AND ANAEROBIC Blood Culture adequate volume   Culture   Final    NO GROWTH 5 DAYS Performed at [facility], [place] [address], [place], [place] [address]  Report Status 05/30/2017 FINAL  Final       Radiology Studies: No results found.    Scheduled Meds: . apixaban  5 mg Oral BID  . aspirin  81 mg Per Tube Daily  . chlorhexidine gluconate (MEDLINE KIT)  15 mL Mouth Rinse BID  . collagenase   Topical Daily  . feeding supplement (PRO-STAT SUGAR FREE 64)  30 mL Per Tube BID  . free water  250 mL Per Tube Q8H  . haloperidol  1 mg Oral Daily  . haloperidol  2 mg Oral QHS  . mouth rinse  15 mL Mouth Rinse QID  . Melatonin  6 mg Oral QHS  . multivitamin  15 mL Oral Daily  . pantoprazole sodium  40 mg Per Tube QHS  . thiamine  100 mg Per Tube Daily   Continuous Infusions: . feeding supplement (JEVITY 1.5 CAL/FIBER) Stopped (06/04/17 0931)  . magnesium sulfate 1 - 4 g bolus IVPB       LOS: 34 days    Time spent: 20 minutes   Dessa Phi, DO [facility] [email or web address] Password [email or web address] 06/04/2017, 10:01 AM

## 2017-06-04 NOTE — Progress Notes (Signed)
Physical Therapy Treatment Patient Details Name: Joseph FloridaLarry E Dail MRN: [id number] DOB: February 22, 1951 Today's Date: 06/04/2017    History of Present Illness Pt is a 67 year old man who was found down, unresponsive and hypothermic on 05/01/17. Developed respiratory failure and intubated 2/13. Pt also with encephalopathy, shock, NSTEMI, acute renal failure, bilateral DVTs, massive PE, Bil DVTs, massive PE, bactermeia. IVC filter placed 2/22. Extubated 2/26, but re-intubated later that day. Trach placed 2/27. Peg placed 2/28. CT 3/7 negative for acute intracranial abnormality. 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 No medical history on file.   PT Comments    Pt progressing with mobility, as well as demonstrating improvements in cognition this session. Able to perform bed mobility and anterior-posterior transfer from bed to recliner with maxA+2; pt minimally assisting with decreased ability to sequence and follow commands despite multimodal cues. Seems very appreciative to be out of bed, although still uncomfortable due to sacral wounds. Will follow acutely.   Follow Up Recommendations  SNF;Supervision/Assistance - 24 hour     Equipment Recommendations  (TBD next venue)    Recommendations for Other Services       Precautions / Restrictions Precautions Precautions: Fall Precaution Comments: Bilat AKAs, sacral wound, trach (PMV), PEG Restrictions Weight Bearing Restrictions: Yes RLE Weight Bearing: Non weight bearing LLE Weight Bearing: Non weight bearing    Mobility  Bed Mobility Overal bed mobility: Needs Assistance Bed Mobility: Rolling;Supine to Sit;Sit to Supine Rolling: Min assist;Max assist         General bed mobility comments: MinA to roll onto L-side with use of bed rail; maxA to roll onto R-side with use of bed rail. Pt incontinent of bowel and dependent for pericare. Able to long sit with maxA+2 for UE support and trunk elevation  Transfers Overall transfer level: Needs  assistance Equipment used: None Transfers: Licensed conveyancerAnterior-Posterior Transfer       Anterior-Posterior transfers: Max assist;+2 physical assistance   General transfer comment: MaxA+2 with use of pad to perform anterior-posterior transfer from bed to recliner. Pt able to assist minimally with BUE support, but decreased cognition/command-following limiting this  Ambulation/Gait                 Stairs            Wheelchair Mobility    Modified Rankin (Stroke Patients Only)       Balance Overall balance assessment: Needs assistance Sitting-balance support: Bilateral upper extremity supported;Feet unsupported Sitting balance-Leahy Scale: Poor Sitting balance - Comments: Reliant on external support                                    Cognition Arousal/Alertness: Awake/alert Behavior During Therapy: WFL for tasks assessed/performed Overall Cognitive Status: Impaired/Different from baseline Area of Impairment: Attention;Following commands;Safety/judgement;Awareness;Problem solving                   Current Attention Level: Sustained   Following Commands: Follows one step commands with increased time;Follows one step commands inconsistently;Follows multi-step commands inconsistently Safety/Judgement: Decreased awareness of deficits;Decreased awareness of safety Awareness: Intellectual Problem Solving: Slow processing;Decreased initiation;Difficulty sequencing;Requires verbal cues General Comments: Pt more appropriately interactive this session; speaking well with PMV. Continues to demonstrate decreased awareness and command following, requiring repeated cues for sequencing      Exercises      General Comments General comments (skin integrity, edema, etc.): SpO2 stable with trach collar at 28% FiO2. Pt with bowel  incontinence requiring sacral and BLE dressing change; RN notified.      Pertinent Vitals/Pain Pain Assessment: Faces Faces Pain Scale:  Hurts little more Pain Location: Sacral wound with repositioning Pain Descriptors / Indicators: Grimacing;Guarding;Sore Pain Intervention(s): Monitored during session;Repositioned    Home Living                      Prior Function            PT Goals (current goals can now be found in the care plan section) Acute Rehab PT Goals Patient Stated Goal: Decreased pain PT Goal Formulation: With patient Time For Goal Achievement: 06/18/17 Potential to Achieve Goals: Fair Progress towards PT goals: Progressing toward goals    Frequency    Min 2X/week      PT Plan Current plan remains appropriate    Co-evaluation              AM-PAC PT "6 Clicks" Daily Activity  Outcome Measure  Difficulty turning over in bed (including adjusting bedclothes, sheets and blankets)?: Unable Difficulty moving from lying on back to sitting on the side of the bed? : Unable Difficulty sitting down on and standing up from a chair with arms (e.g., wheelchair, bedside commode, etc,.)?: Unable Help needed moving to and from a bed to chair (including a wheelchair)?: A Lot Help needed walking in hospital room?: Total Help needed climbing 3-5 steps with a railing? : Total 6 Click Score: 7    End of Session Equipment Utilized During Treatment: Oxygen(trach collar) Activity Tolerance: Patient tolerated treatment well Patient left: in chair;with call bell/phone within reach;with chair alarm set Nurse Communication: Mobility status PT Visit Diagnosis: Other abnormalities of gait and mobility (R26.89);Muscle weakness (generalized) (M62.81)     Time: 1610-9604 PT Time Calculation (min) (ACUTE ONLY): 32 min  Charges:  $Therapeutic Activity: 8-22 mins                    G Codes:      Ina Homes, PT, DPT Acute Rehab Services  Pager: [phone]  [name] 06/04/2017, 3:42 PM

## 2017-06-05 DIAGNOSIS — Z978 Presence of other specified devices: Secondary | ICD-10-CM

## 2017-06-05 DIAGNOSIS — Z4659 Encounter for fitting and adjustment of other gastrointestinal appliance and device: Secondary | ICD-10-CM

## 2017-06-05 DIAGNOSIS — I2699 Other pulmonary embolism without acute cor pulmonale: Secondary | ICD-10-CM

## 2017-06-05 DIAGNOSIS — J95850 Mechanical complication of respirator: Secondary | ICD-10-CM

## 2017-06-05 DIAGNOSIS — Z430 Encounter for attention to tracheostomy: Secondary | ICD-10-CM

## 2017-06-05 LAB — CBC
HCT: 28.8 % — ABNORMAL LOW (ref 39.0–52.0)
Hemoglobin: 8.9 g/dL — ABNORMAL LOW (ref 13.0–17.0)
MCH: 28.1 pg (ref 26.0–34.0)
MCHC: 30.9 g/dL (ref 30.0–36.0)
MCV: 90.9 fL (ref 78.0–100.0)
PLATELETS: 558 10*3/uL — AB (ref 150–400)
RBC: 3.17 MIL/uL — AB (ref 4.22–5.81)
RDW: 17.8 % — AB (ref 11.5–15.5)
WBC: 22.4 10*3/uL — AB (ref 4.0–10.5)

## 2017-06-05 LAB — BASIC METABOLIC PANEL
Anion gap: 8 (ref 5–15)
BUN: 23 mg/dL — AB (ref 6–20)
CO2: 26 mmol/L (ref 22–32)
CREATININE: 0.97 mg/dL (ref 0.61–1.24)
Calcium: 8.4 mg/dL — ABNORMAL LOW (ref 8.9–10.3)
Chloride: 100 mmol/L — ABNORMAL LOW (ref 101–111)
GFR calc non Af Amer: >60 mL/min (ref >60)
Glucose, Bld: 144 mg/dL — ABNORMAL HIGH (ref 65–99)
Potassium: 4.7 mmol/L (ref 3.5–5.1)
SODIUM: 134 mmol/L — AB (ref 135–145)

## 2017-06-05 LAB — PROCALCITONIN

## 2017-06-05 NOTE — Progress Notes (Addendum)
  Progress Note  12 Days Post-Op  Subjective:  No complaints; wants to be washed and cleaned.  Tm 99.3 now afebrile  Vitals:   06/05/17 0729 06/05/17 0744  BP: 138/74   Pulse: 89 89  Resp: 15 16  Temp: 98.4 F (36.9 C)   SpO2: 99% 99%   Physical Exam: Incisions:  Right stump incision dressing changed-still with some bloody drainage.  No evidence of infection.  One lateral staple removed from the open wound due to packing hooked on staple.  Left stump dressing removed, which had some old blood on it;  No active drainage at this time.  Stump is soft.  Otherwise, wounds are clean and no evidence of infection. CBC    Intake/Output Summary (Last 24 hours) at 06/05/2017 0845 Last data filed at 06/05/2017 0700 Gross per 24 hour  Intake 1370 ml  Output 2700 ml  Net -1330 ml    Assessment/Plan:  67 y.o. male is s/p bilateral above knee amputations  12 Days Post-Op  -dressing changed on right stump-bloody drainage on packing.  One staple laterally on the open wound removed due to packing caught on staple.  Repacked with wet to dry dressing.  There is no evidence of purulence or infection.  Left stump with no active drainage.  Incision healing nicely.   -continue bid wet to dry saline dressing changes.    Doreatha MassedSamantha Rhyne, PA-C [facility] [phone] 06/05/2017 8:45 AM  I have interviewed the patient and examined the patient. I agree with the findings by the PA. Continue dressing changes daily.  Cari Carawayhris Dickson, MD [phone]

## 2017-06-05 NOTE — Progress Notes (Deleted)
SATURATION QUALIFICATIONS: (This note is used to comply with regulatory documentation for home oxygen)  Patient Saturations on Room Air at Rest = 99%  Patient Saturations on Room Air while Ambulating = 95%    Please briefly explain why patient needs home oxygen: 

## 2017-06-05 NOTE — Plan of Care (Signed)
Continue current care plan 

## 2017-06-05 NOTE — Care Management Important Message (Signed)
Important Message  Patient Details  Name: Joseph FloridaLarry E Downs MRN: [id number] Date of Birth: April 05, 1950   Medicare Important Message Given:  Yes    Durinda Buzzelli P Tyrian Peart 06/05/2017, 1:55 PM

## 2017-06-05 NOTE — Progress Notes (Signed)
   06/05/17 1600  Clinical Encounter Type  Visited With Patient and family together  Visit Type Follow-up  Referral From Nurse  Consult/Referral To Chaplain  Spiritual Encounters  Spiritual Needs Literature   Followed up with this patient from yesterday.  Completed the HCPA and put copy in the patient's chart and gave original to the sister.  Will follow and support as needed. Chaplain Agustin CreeNewton Tia Gelb

## 2017-06-05 NOTE — Progress Notes (Signed)
PROGRESS NOTE  Joseph Downs XNA:[id number] DOB: May 18, 1950 DOA: 05/01/2017 PCP: Patient, No Pcp Per  HPI/Recap of past 24 hours: Joseph Downs is a 67 yo M, reportedly homeless, no known past medical history other than obstructive uropathy requiring foley. Evidently, on 2/13, he was found down in the cold. He was brought to the ER, hypotensive, had lactate >7 despite 30 cc/kg IVF and re-warming. He was admitted to ICU, at that time was mentating enough to explain that he had passed out, not clear why. That night, he developed respiratory distress requiring intubation. He was treated with broad spectrum antibiotics for septic shock with hypotension, tachycardia and leukocytosis. He had renal failure, creatinine up to 7 and was started on CVVHD. Blood cultures grew Acineto lwoffii, urine cx frew staph lugdunensis. Echo showed severe RV strain and bilateral LE dopplers showed extensive DVT. CTA was not possible, nor catheterization/lysis due to AKI. He was started on heparin drip. At time of transfer out of ICU to [facility] service, patient has tolerated trach collar. He has PEG. Cardiology, Nephrology, Vascular surgery, and Pulm were consulted. He is s/p B/L AKA due to frostbite.   06/05/17: Patient seen and examined at his bedside.  He denies any pain in his thumb.  Pain is well controlled on current management.  Denies any chest pain, dyspnea or palpitations.   Assessment/Plan: Principal Problem:   Sepsis ([place]) Active Problems:   Lactic acidemia   Acute respiratory failure (HCC)   Exposure to weather condition   Hypotension   Non-ST elevation (NSTEMI) myocardial infarction (HCC)   Malnutrition of moderate degree   Acute cystitis with hematuria   Acute deep vein thrombosis (DVT) of proximal vein of both lower extremities (HCC)   Pressure injury of skin   Cardiogenic shock (HCC)   Acute cor pulmonale (HCC)   Acute respiratory failure with hypoxemia (HCC)   Deep vein thrombosis (DVT) of  non-extremity vein   SIRS (systemic inflammatory response syndrome) (HCC)   S/P AKA (above knee amputation) bilateral (HCC)   Hypernatremia   Acute blood loss anemia   Encephalopathy   Postoperative pain   Pulmonary embolus (HCC)   Tracheostomy status (HCC)  Sepsis secondary to Pseudomonas UTI Completed 7 days of IV Zosyn Afebrile in the last 24 hours. Continue to monitor fever curve  Leukocytosis Possibly reactive from recent surgery  pro-calcitonin less than 0.10 Afebrile  Resume frostbite gangrene of feet Vascular surgery following Wound care specialist following POD #12 post bilateral above-knee amputations Continue regular dressing changes  Acute blood loss anemia/normocytic anemia Status post bilateral AKA on 05/24/2017 Has been transfused 2 units PRBCs on 05/27/2017 Hemoglobin stable  Acute hypoxic respiratory failure status post trach collar P CCM follows Maintain O2 saturation 92% or greater  Presumed massive pulmonary embolism/bilateral DVT Status post IVC filter IVC filter placed due to patient's history of noncompliance Continue Eliquis  AK I on CKD 3 Stable Avoid nephrotoxic agents/hypotension  Hyponatremia Resolved Continue free water flushes  Dysphagia status post PEG tube placement Continue tube feeding Continue free water flushes Head of the bed increase angle at 35 degree or higher to prevent aspiration Speech therapy is following recommending dysphagia 3 mechanical soft thin liquid Also recommends use of Passy-Muir speech valve  NSTEMI Possibly related to pulmonary embolism Cardiology consulted     Code Status: Full.  Family Communication: None at bedside.  Disposition Plan: SNF when hemodynamically stable.  Possibly tomorrow if no acute events overnight.   Consultants:  Vascular  surgery Orthopedic surgery  Procedures:  Bilateral above-the-knee amputation  Antimicrobials:  None.  DVT prophylaxis:  SCDs.   Eliquis  Objective: Vitals:   06/05/17 0518 06/05/17 0729 06/05/17 0744 06/05/17 1153  BP:  138/74    Pulse: 97 89 89 90  Resp: _0 Temp:  98.4 F (36.9 C)    TempSrc:  Oral    SpO2: 100% 99% 99% 99%  Weight:      Height:        Intake/Output Summary (Last 24 hours) at 06/05/2017 1300 Last data filed at 06/05/2017 0900 Gross per 24 hour  Intake 1355 ml  Output 1950 ml  Net -595 ml   Filed Weights   05/31/17 0300 06/04/17 0450  Weight: 84.4 kg (186 lb) 98.9 kg (218 lb)    Exam:   General: 67 year old African-American male well-developed well-nourished bilateral above-the-knee amputation.  Alert and oriented x3.  Tracheostomy noted  Cardiovascular: Regular rate and rhythm with no rubs or gallops.  Respiratory: Clear to auscultation with no wheezes or rales.  Abdomen: Tender with normal bowel sounds x4.  Soft nondistended mild PEG tube in place.  Musculoskeletal: Bilaterally with minimal sanguinous drainage  Skin: As noted above  Psychiatry: Mood is appropriate for condition and setting   Data Reviewed: CBC: Recent Labs  Lab 06/01/17 0306 06/02/17 0228 06/03/17 0314 06/04/17 0812 06/05/17 0331  WBC 21.6* 19.3* 16.8* 22.3* 22.4*  HGB 8.1* 8.1* 7.6* 8.4* 8.9*  HCT 25.4* 25.9* 23.9* 27.0* 28.8*  MCV 89.4 89.9 90.5 91.2 90.9  PLT 404* 439* 411* 531* 917*   Basic Metabolic Panel: Recent Labs  Lab 06/01/17 0306 06/02/17 0228 06/03/17 0314 06/04/17 0812 06/05/17 0331  NA 139 140 137 135 134*  K 3.9 4.0 4.2 4.7 4.7  CL 105 105 104 102 100*  CO2 _1 GLUCOSE 140* 139* 153* 153* 144*  BUN 24* 22* 19 18 23*  CREATININE 1.00 1.08 0.99 0.93 0.97  CALCIUM 8.0* 8.2* 8.1* 8.1* 8.4*   GFR: Estimated Creatinine Clearance: 88.4 mL/min (by C-G formula based on SCr of 0.97 mg/dL). Liver Function Tests: No results for input(s): AST, ALT, ALKPHOS, BILITOT, PROT, ALBUMIN in the last 168 hours. No results for input(s): LIPASE, AMYLASE in the  last 168 hours. No results for input(s): AMMONIA in the last 168 hours. Coagulation Profile: No results for input(s): INR, PROTIME in the last 168 hours. Cardiac Enzymes: No results for input(s): CKTOTAL, CKMB, CKMBINDEX, TROPONINI in the last 168 hours. BNP (last 3 results) No results for input(s): PROBNP in the last 8760 hours. HbA1C: No results for input(s): HGBA1C in the last 72 hours. CBG: Recent Labs  Lab 05/29/17 1315 05/29/17 1712  GLUCAP 106* 130*   Lipid Profile: No results for input(s): CHOL, HDL, LDLCALC, TRIG, CHOLHDL, LDLDIRECT in the last 72 hours. Thyroid Function Tests: No results for input(s): TSH, T4TOTAL, FREET4, T3FREE, THYROIDAB in the last 72 hours. Anemia Panel: No results for input(s): VITAMINB12, FOLATE, FERRITIN, TIBC, IRON, RETICCTPCT in the last 72 hours. Urine analysis:    Component Value Date/Time   COLORURINE YELLOW 05/25/2017 0654   APPEARANCEUR CLEAR 05/25/2017 0654   LABSPEC 1.013 05/25/2017 0654   PHURINE 6.0 05/25/2017 0654   GLUCOSEU 50 (A) 05/25/2017 0654   HGBUR MODERATE (A) 05/25/2017 0654   BILIRUBINUR NEGATIVE 05/25/2017 0654   KETONESUR NEGATIVE 05/25/2017 0654   PROTEINUR NEGATIVE 05/25/2017 0654   NITRITE POSITIVE (A) 05/25/2017 0654   LEUKOCYTESUR TRACE (A)  05/25/2017 0654   Sepsis Labs: _0 (procalcitonin:4,lacticidven:4)  )No results found for this or any previous visit (from the past 240 hour(s)).    Studies: No results found.  Scheduled Meds: . apixaban  5 mg Oral BID  . aspirin  81 mg Per Tube Daily  . chlorhexidine gluconate (MEDLINE KIT)  15 mL Mouth Rinse BID  . collagenase   Topical Daily  . feeding supplement (ENSURE ENLIVE)  237 mL Oral BID BM  . feeding supplement (PRO-STAT SUGAR FREE 64)  30 mL Per Tube BID  . free water  250 mL Per Tube Q8H  . haloperidol  1 mg Oral Daily  . haloperidol  2 mg Oral QHS  . mouth rinse  15 mL Mouth Rinse QID  . Melatonin  6 mg Oral QHS  . multivitamin  15 mL  Oral Daily  . pantoprazole sodium  40 mg Per Tube QHS  . thiamine  100 mg Per Tube Daily    Continuous Infusions: . feeding supplement (JEVITY 1.5 CAL/FIBER) Stopped (06/05/17 0927)  . magnesium sulfate 1 - 4 g bolus IVPB       LOS: 35 days     Kayleen Memos, MD [facility] Pager [phone]  If 7PM-7AM, please contact night-coverage [email or web address] Password [email or web address] 06/05/2017, 1:00 PM

## 2017-06-05 NOTE — Progress Notes (Signed)
  Speech Language Pathology Treatment: Dysphagia;Passy Muir Speaking valve  Patient Details Name: Joseph Downs MRN: [id number] DOB: 07-12-1950 Today's Date: 06/05/2017 Time: [id number] SLP Time Calculation (min) (ACUTE ONLY): 27 min  Assessment / Plan / Recommendation Clinical Impression  PT was positioned in bed with PMV off trach collar upon entering room. SLP assisted nursing staff in repositioning of pt in bed for proper PO consumption trials and breath support for phonation. SLP place PMV on trach collar, although pt demonstrated phonation at phrase level without PMV in place, demonstrating strong respiratory support and upper airway patency. SLP instructed pt to communicate in longer utterance at sentence level, pt requiring Mod A verbal cues to increase length. Pt demonstrated tolerance of PMV with stable vital signs, clear vocal quality and slight reduction in vocal intensity. SLP provided dys 3 and thin liquid via cup PO trials, pt demonstrated rapid consumption with 1 noted delayed cough following thin liquid, however followed with clear vocal quality and no other s/s aspiration noted. SLP communicated with nursing staff ability swallowing function during meals, nursing staff observed safe swallowing, however reduced PO intake, suggesting a reduction in tube feedings might increase PO intake. Pt continues to tolerate dys 3 and thin liquid diet with PMV in place. Pt expressed continious discomfort thorough session and requested to get up, SLP notified nursing staff. SLP reviewed PMV use with pt and that it should be in place during all waking hours, pt stated understanding. Pt was left in room with call bell within reach. Recommend to continue skilled ST services.    HPI HPI: 67 year old homeless man found down/13 with hypothermia, shock lactic acidosis. 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 HehadAcinetobacter in blood and staph in urine with elevated PSA. Unable to  be weaned from vent (ETT 2/13-2/27), trach placed 2/27, PEG 2/28. FEES recommended Dys 1, honey thick liquids. MBS today for possible liquid upgrade.      SLP Plan  Continue with current plan of care       Recommendations  Diet recommendations: Dysphagia 3 (mechanical soft);Thin liquid Liquids provided via: Cup;Straw Medication Administration: Crushed with puree Supervision: Patient able to self feed;Staff to assist with self feeding;Full supervision/cueing for compensatory strategies Compensations: Slow rate;Small sips/bites Postural Changes and/or Swallow Maneuvers: Seated upright 90 degrees      Patient may use Passy-Muir Speech Valve: During all therapies with supervision;Intermittently with supervision PMSV Supervision: Full MD: Please consider changing trach tube to : Smaller size         Oral Care Recommendations: Oral care BID Follow up Recommendations: Skilled Nursing facility SLP Visit Diagnosis: Dysphagia, oropharyngeal phase (R13.12);Aphonia (R49.1) Plan: Continue with current plan of care       GO                Joseph Downs  [name] 06/05/2017, 10:51 AM

## 2017-06-06 DIAGNOSIS — J9621 Acute and chronic respiratory failure with hypoxia: Secondary | ICD-10-CM

## 2017-06-06 DIAGNOSIS — Z452 Encounter for adjustment and management of vascular access device: Secondary | ICD-10-CM

## 2017-06-06 LAB — CBC
HCT: 29.8 % — ABNORMAL LOW (ref 39.0–52.0)
Hemoglobin: 9.4 g/dL — ABNORMAL LOW (ref 13.0–17.0)
MCH: 28.8 pg (ref 26.0–34.0)
MCHC: 31.5 g/dL (ref 30.0–36.0)
MCV: 91.4 fL (ref 78.0–100.0)
PLATELETS: 623 10*3/uL — AB (ref 150–400)
RBC: 3.26 MIL/uL — AB (ref 4.22–5.81)
RDW: 18 % — AB (ref 11.5–15.5)
WBC: 21.5 10*3/uL — ABNORMAL HIGH (ref 4.0–10.5)

## 2017-06-06 LAB — GLUCOSE, CAPILLARY: GLUCOSE-CAPILLARY: 158 mg/dL — AB (ref 65–99)

## 2017-06-06 NOTE — Progress Notes (Signed)
PROGRESS NOTE  Joseph Downs QHU:[id number] DOB: [birth year] DOA: 05/01/2017 PCP: Patient, No Pcp Per  HPI/Recap of past 24 hours: Joseph Downs is a 67 yo M, reportedly homeless, no known past medical history other than obstructive uropathy requiring foley. Evidently, on 2/13, he was found down in the cold. He was brought to the ER, hypotensive, had lactate >7 despite 30 cc/kg IVF and re-warming. He was admitted to ICU, at that time was mentating enough to explain that he had passed out, not clear why. That night, he developed respiratory distress requiring intubation. He was treated with broad spectrum antibiotics for septic shock with hypotension, tachycardia and leukocytosis. He had renal failure, creatinine up to 7 and was started on CVVHD. Blood cultures grew Acineto lwoffii, urine cx frew staph lugdunensis. Echo showed severe RV strain and bilateral LE dopplers showed extensive DVT. CTA was not possible, nor catheterization/lysis due to AKI. He was started on heparin drip. At time of transfer out of ICU to [facility] service, patient has tolerated trach collar. He has PEG. Cardiology, Nephrology, Vascular surgery, and Pulm were consulted. He is s/p B/L AKA due to frostbite.   06/05/17: Patient seen and examined at his bedside.  He denies any pain in his thumb.  Pain is well controlled on current management.  Denies any chest pain, dyspnea or palpitations.  06/06/2017: Patient seen and examined at his bedside.  He has no new complaints.  He is alert and oriented x3.  Pulmonary is planning on capping trach overnight with continuous O2 monitoring and if no desaturation overnight and no issues with secretion in the morning will consider decannulation.  Assessment/Plan: Principal Problem:   Sepsis ([place]) Active Problems:   Lactic acidemia   Acute respiratory failure (HCC)   Exposure to weather condition   Hypotension   Non-ST elevation (NSTEMI) myocardial infarction (HCC)   Malnutrition of moderate  degree   Acute cystitis with hematuria   Acute deep vein thrombosis (DVT) of proximal vein of both lower extremities (HCC)   Pressure injury of skin   Cardiogenic shock (HCC)   Acute cor pulmonale (HCC)   Acute respiratory failure with hypoxemia (HCC)   Deep vein thrombosis (DVT) of non-extremity vein   SIRS (systemic inflammatory response syndrome) (HCC)   S/P AKA (above knee amputation) bilateral (HCC)   Hypernatremia   Acute blood loss anemia   Encephalopathy   Postoperative pain   Pulmonary embolus (HCC)   Tracheostomy status (HCC)  Sepsis secondary to Pseudomonas UTI Completed 7 days of IV Zosyn Afebrile in the last 24 hours. Continue to monitor fever curve  Leukocytosis White count is trending down Possibly reactive from recent surgery  pro-calcitonin less than 0.10 Afebrile  Resume frostbite gangrene of feet Vascular surgery following Wound care specialist following POD #12 post bilateral above-knee amputations Continue regular dressing changes  Acute blood loss anemia/normocytic anemia Status post bilateral AKA on 05/24/2017 Has been transfused 2 units PRBCs on 05/27/2017 Hemoglobin stable  Acute hypoxic respiratory failure status post trach collar P CCM follows Maintain O2 saturation 92% or greater Per pulmonology cap trach overnight with continuous O2 monitoring if no desaturation overnight and no issues with secretion in a.m. will consider decannulation.  Presumed massive pulmonary embolism/bilateral DVT Status post IVC filter IVC filter placed due to patient's history of noncompliance Continue Eliquis  AK I on CKD 3 Stable Avoid nephrotoxic agents/hypotension  Hyponatremia Resolved Continue free water flushes  Dysphagia status post PEG tube placement Continue tube feeding Continue  free water flushes Head of the bed increase angle at 35 degree or higher to prevent aspiration Speech therapy is following recommending dysphagia 3 mechanical soft thin  liquid Also recommends use of Passy-Muir speech valve  NSTEMI Elevated troponin possibly related to pulmonary embolism  Cardiology consulted  Ambulatory dysfunction secondary to bilateral lower extremity amputation PT to evaluate and treat Awaiting placement at SNF to continue rehab.   Code Status: Full.  Family Communication: None at bedside.  Disposition Plan: SNF when hemodynamically stable.  Possibly tomorrow if no acute events overnight.   Consultants:  Vascular surgery Orthopedic surgery  Procedures:  Bilateral above-the-knee amputation  Antimicrobials:  None.  DVT prophylaxis:  SCDs.  Eliquis  Objective: Vitals:   06/06/17 0706 06/06/17 0856 06/06/17 1202 06/06/17 1300  BP: 115/72  (!) 143/93   Pulse: 93 (!) 114 (!) 112 (!) 109  Resp: 13 (!) 34 (!) 25 (!) 27  Temp: 98.6 F (37 C)  98.6 F (37 C)   TempSrc: Oral  Oral   SpO2: 98% 100% 100% 100%  Weight:      Height:        Intake/Output Summary (Last 24 hours) at 06/06/2017 1405 Last data filed at 06/06/2017 1236 Gross per 24 hour  Intake 595 ml  Output 1725 ml  Net -1130 ml   Filed Weights   05/31/17 0300 06/04/17 0450  Weight: 84.4 kg (186 lb) 98.9 kg (218 lb)    Exam: 06/06/2017.  Patient seen and examined at his bedside.  Physical exam is essentially unchanged from prior except for what is mentioned below.   General: 67 year old African-American male well-developed well-nourished in no acute distress.  Above-the-knee amputation.  Alert and oriented x3.  Trach collar in place.  Cardiovascular: Regular rate and rhythm with no rubs or gallops.  Respiratory: Clear to auscultation with no wheezes or rales.  Abdomen: Nontender nondistended with PEG tube in place at left upper quadrant.  Normal bowel sounds x4.    Musculoskeletal: Minimal sanguinous drainage noted at the right thump.    Skin: As noted above  Psychiatry: Mood is appropriate for condition and setting   Data  Reviewed: CBC: Recent Labs  Lab 06/02/17 0228 06/03/17 0314 06/04/17 0812 06/05/17 0331 06/06/17 0530  WBC 19.3* 16.8* 22.3* 22.4* 21.5*  HGB 8.1* 7.6* 8.4* 8.9* 9.4*  HCT 25.9* 23.9* 27.0* 28.8* 29.8*  MCV 89.9 90.5 91.2 90.9 91.4  PLT 439* 411* 531* 558* 188*   Basic Metabolic Panel: Recent Labs  Lab 06/01/17 0306 06/02/17 0228 06/03/17 0314 06/04/17 0812 06/05/17 0331  NA 139 140 137 135 134*  K 3.9 4.0 4.2 4.7 4.7  CL 105 105 104 102 100*  CO2 25 25 24 24 26   GLUCOSE 140* 139* 153* 153* 144*  BUN 24* 22* 19 18 23*  CREATININE 1.00 1.08 0.99 0.93 0.97  CALCIUM 8.0* 8.2* 8.1* 8.1* 8.4*   GFR: Estimated Creatinine Clearance: 88.4 mL/min (by C-G formula based on SCr of 0.97 mg/dL). Liver Function Tests: No results for input(s): AST, ALT, ALKPHOS, BILITOT, PROT, ALBUMIN in the last 168 hours. No results for input(s): LIPASE, AMYLASE in the last 168 hours. No results for input(s): AMMONIA in the last 168 hours. Coagulation Profile: No results for input(s): INR, PROTIME in the last 168 hours. Cardiac Enzymes: No results for input(s): CKTOTAL, CKMB, CKMBINDEX, TROPONINI in the last 168 hours. BNP (last 3 results) No results for input(s): PROBNP in the last 8760 hours. HbA1C: No results for input(s): HGBA1C  in the last 72 hours. CBG: Recent Labs  Lab 06/06/17 0822  GLUCAP 158*   Lipid Profile: No results for input(s): CHOL, HDL, LDLCALC, TRIG, CHOLHDL, LDLDIRECT in the last 72 hours. Thyroid Function Tests: No results for input(s): TSH, T4TOTAL, FREET4, T3FREE, THYROIDAB in the last 72 hours. Anemia Panel: No results for input(s): VITAMINB12, FOLATE, FERRITIN, TIBC, IRON, RETICCTPCT in the last 72 hours. Urine analysis:    Component Value Date/Time   COLORURINE YELLOW 05/25/2017 0654   APPEARANCEUR CLEAR 05/25/2017 0654   LABSPEC 1.013 05/25/2017 0654   PHURINE 6.0 05/25/2017 0654   GLUCOSEU 50 (A) 05/25/2017 0654   HGBUR MODERATE (A) 05/25/2017 0654    BILIRUBINUR NEGATIVE 05/25/2017 0654   KETONESUR NEGATIVE 05/25/2017 0654   PROTEINUR NEGATIVE 05/25/2017 0654   NITRITE POSITIVE (A) 05/25/2017 0654   LEUKOCYTESUR TRACE (A) 05/25/2017 0654   Sepsis Labs: @LABRCNTIP (procalcitonin:4,lacticidven:4)  )No results found for this or any previous visit (from the past 240 hour(s)).    Studies: No results found.  Scheduled Meds: . apixaban  5 mg Oral BID  . aspirin  81 mg Per Tube Daily  . chlorhexidine gluconate (MEDLINE KIT)  15 mL Mouth Rinse BID  . collagenase   Topical Daily  . feeding supplement (ENSURE ENLIVE)  237 mL Oral BID BM  . feeding supplement (PRO-STAT SUGAR FREE 64)  30 mL Per Tube BID  . free water  250 mL Per Tube Q8H  . haloperidol  1 mg Oral Daily  . haloperidol  2 mg Oral QHS  . mouth rinse  15 mL Mouth Rinse QID  . Melatonin  6 mg Oral QHS  . multivitamin  15 mL Oral Daily  . pantoprazole sodium  40 mg Per Tube QHS  . thiamine  100 mg Per Tube Daily    Continuous Infusions: . feeding supplement (JEVITY 1.5 CAL/FIBER) Stopped (06/06/17 0936)  . magnesium sulfate 1 - 4 g bolus IVPB       LOS: 36 days     Kayleen Memos, MD [facility] Pager [phone]  If 7PM-7AM, please contact night-coverage [email or web address] Password [facility] 06/06/2017, 2:05 PM

## 2017-06-06 NOTE — Progress Notes (Signed)
Physical Therapy Treatment Patient Details Name: Joseph Downs MRN: [id number] DOB: 10/09/1950 Today's Date: 06/06/2017    History of Present Illness Pt is a 67 year old man who was found down, unresponsive and hypothermic on 05/01/17. Developed respiratory failure and intubated 2/13. Pt also with encephalopathy, shock, NSTEMI, acute renal failure, bilateral DVTs, massive PE, Bil DVTs, massive PE, bactermeia. IVC filter placed 2/22. Extubated 2/26, but re-intubated later that day. Trach placed 2/27. Peg placed 2/28. CT 3/7 negative for acute intracranial abnormality. 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 No medical history on file.    PT Comments    Pt performed bed mobility and AP transfer OOB into chair.  Pt following commands but presents with memory deficits.  Pt tolerated session well and required decreased assistance.  Plan for SNF remains appropriate as patient will require a more lengthy recovery due to cognitive deficits and skin integrity.    Follow Up Recommendations  SNF;Supervision/Assistance - 24 hour     Equipment Recommendations  (TBD at next venue)    Recommendations for Other Services       Precautions / Restrictions Precautions Precautions: Fall Precaution Comments: Bilat AKAs, sacral wound, trach (PMV), PEG Required Braces or Orthoses: (n/a) Other Brace/Splint: (n/a) Restrictions Weight Bearing Restrictions: Yes RLE Weight Bearing: Non weight bearing LLE Weight Bearing: Non weight bearing Other Position/Activity Restrictions: New B AKAs    Mobility  Bed Mobility Overal bed mobility: Needs Assistance Bed Mobility: Supine to Sit;Rolling Rolling: Max assist(min to initiate, max to assist.  )   Supine to sit: Mod assist;+2 for physical assistance(Pt performed supine to long sitting)     General bed mobility comments: Pt reaching for rails and therapists hands to assist in pulling into a seated position.  Pt required encouragement.  He presents with descent core  strength given his history.    Transfers Overall transfer level: Needs assistance Equipment used: (chux pad for scooting.  ) Transfers: Licensed conveyancer transfers: Mod assist;+2 physical assistance   General transfer comment: ModA+2 with use of pad to perform anterior-posterior transfer from bed to recliner. Pt able to assist minimally with BUE support, Once he is able to hold on to the rails of the chair he is min assist +2.    Ambulation/Gait                 Stairs            Wheelchair Mobility    Modified Rankin (Stroke Patients Only)       Balance Overall balance assessment: Needs assistance   Sitting balance-Leahy Scale: Poor Sitting balance - Comments: Reliant on external support                                    Cognition Arousal/Alertness: Awake/alert Behavior During Therapy: WFL for tasks assessed/performed Overall Cognitive Status: Impaired/Different from baseline Area of Impairment: Attention;Following commands;Safety/judgement;Awareness;Problem solving                 Orientation Level: Disoriented to;Time;Situation Current Attention Level: Sustained   Following Commands: Follows one step commands with increased time;Follows one step commands inconsistently;Follows multi-step commands inconsistently Safety/Judgement: Decreased awareness of deficits;Decreased awareness of safety Awareness: Intellectual   General Comments: Pt educated to not get up as he has already fallen on admission, immediatley after verbally agreeing he reports "can't I use that walker?".  re-educated patient his did  not have his feet or legs anymore and replied with "Oh"      Exercises      General Comments        Pertinent Vitals/Pain Pain Assessment: 0-10 Pain Score: 5  Pain Location: Sacral wound with repositioning Pain Descriptors / Indicators: Grimacing;Guarding;Sore Pain Intervention(s): Monitored  during session;Repositioned    Home Living                      Prior Function            PT Goals (current goals can now be found in the care plan section) Acute Rehab PT Goals Patient Stated Goal: Decreased pain Potential to Achieve Goals: Fair Progress towards PT goals: Progressing toward goals    Frequency    Min 2X/week      PT Plan Current plan remains appropriate    Co-evaluation              AM-PAC PT "6 Clicks" Daily Activity  Outcome Measure  Difficulty turning over in bed (including adjusting bedclothes, sheets and blankets)?: Unable Difficulty moving from lying on back to sitting on the side of the bed? : Unable Difficulty sitting down on and standing up from a chair with arms (e.g., wheelchair, bedside commode, etc,.)?: Unable Help needed moving to and from a bed to chair (including a wheelchair)?: A Lot Help needed walking in hospital room?: Total Help needed climbing 3-5 steps with a railing? : Total 6 Click Score: 7    End of Session   Activity Tolerance: Patient tolerated treatment well Patient left: in chair;with call bell/phone within reach;with chair alarm set Nurse Communication: Mobility status PT Visit Diagnosis: Other abnormalities of gait and mobility (R26.89);Muscle weakness (generalized) (M62.81)     Time: [id number] PT Time Calculation (min) (ACUTE ONLY): 26 min  Charges:  $Therapeutic Activity: 23-37 mins                    G CodesJoycelyn Rua:       Skiler Olden, PTA pager [phone]    Florestine Aversimee J Cheryllynn Sarff 06/06/2017, 11:56 AM

## 2017-06-06 NOTE — Progress Notes (Signed)
Pt found on floor.  Low bed in use, mats on floor, 4 rails up, bed alarm on.  Pt denies any injury, states he wanted to get up.  VSS, pt placed back in bed.  Kirtland BouchardK. Schorr NP paged.  Will continue to monitor pt closely.  Safety precautions maintained

## 2017-06-06 NOTE — Progress Notes (Signed)
PULMONARY MEDICINE follow up   Name: Joseph Downs MRN: [id number] DOB: 1951-03-09    ADMISSION DATE:  05/01/2017 CONSULTATION DATE:  05/01/17 REFERRING MD:  Dr. Deretha Emory CHIEF COMPLAINT:  Found down  HISTORY OF PRESENT ILLNESS:   67 year old homeless man found down/13 with hypothermia, shock lactic acidosis. Found to have bilateral DVTs and evidence of RV strain on echo He had Acinetobacter in blood and staph in urine with elevated PSA. Unable to be weaned from vent, trach placed 2/27, plans to place PEG 2/28  STUDIES:  ECHO 2/14 - EF 65-70%, G1DD, RV strain Echo 2/21 - EF 55-60%, RV improved  CULTURES: MRSA PCR negative Ucx + staph lugdenensis Blood cx 2/13 >> Diphtheroids + Acinetobacter lwoffii Blood cx 2/19 >>no growth x 5 days Blood cx 2/20 >> no growth x 5 days  ANTIBIOTICS: Zosyn 2/13> 2/18 Vanc 2/13 >>2/19 Meropenem 2/18 >>  SIGNIFICANT EVENTS: 2/13 admitted, intubated 2/22 IVC filter placed 2/25 1u PRBC 2/27 Trach  LINES/TUBES: ETT 2/13 >> 2/27 Trach 2/27>>> Foley 2/13>>> PEG 2/28>>> Right IJ HD catheter 2/15 >>out  Left IJ CVC 2/15 >>out  SUBJECTIVE:  No events overnight, no new complaints, self decannulated x2 this weekend.  Today's Vitals   06/06/17 0706 06/06/17 0856 06/06/17 1202 06/06/17 1300  BP: 115/72  (!) 143/93   Pulse: 93 (!) 114 (!) 112 (!) 109  Resp: 13 (!) 34 (!) 25 (!) 27  Temp: 98.6 F (37 C)  98.6 F (37 C)   TempSrc: Oral  Oral   SpO2: 98% 100% 100% 100%  Weight:      Height:      PainSc: 0-No pain        Intake/Output Summary (Last 24 hours) at 06/06/2017 1325 Last data filed at 06/06/2017 1236 Gross per 24 hour  Intake 595 ml  Output 1725 ml  Net -1130 ml   PHYSICAL EXAMINATION: General: Chronically ill appearing male, on TC with PMV, NAD HEENT: Cuffless 6 in place, Speed/AT, PERRL and MMM Pulmonary: Coarse BS diffusely Cardiac: RRR, Nl S1/S2 and -M/R/G Abdomen: Soft, NT, ND and +BS Neuro/psych: Opens eyes and  follows some commands Extremities/musculoskeletal: bilateral AKA- bandaged  LABS:  BMP Latest Ref Rng & Units 06/05/2017 06/04/2017 06/03/2017  Glucose 65 - 99 mg/dL 782(N) 562(Z) 308(M)  BUN 6 - 20 mg/dL 57(Q) 18 19  Creatinine 0.61 - 1.24 mg/dL 4.69 6.29 5.28  Sodium 135 - 145 mmol/L 134(L) 135 137  Potassium 3.5 - 5.1 mmol/L 4.7 4.7 4.2  Chloride 101 - 111 mmol/L 100(L) 102 104  CO2 22 - 32 mmol/L 26 24 24   Calcium 8.9 - 10.3 mg/dL 4.1(L) 8.1(L) 8.1(L)     I reviewed CXR myself, trach is in a good position  ASSESSMENT / PLAN: Acute respiratory failure on ventilator- trach 2/27 Likely pulmonary embolism with associated right heart strain Toxic metabolic encephalopathy Agitation Stress related non-ST elevation MI, suspect right heart strain Acute Cor pulmonale Acute non-oliguric renal failure; creatinine is improved some Rhabdomyolysis Fluid and electrolyte imbalance: Hypernatremia, hypokalemia, metabolic acidosis  Acinetobacter bacteremia. 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  Discussed with PCCM-NP and bedside RN  PCCM will see again in AM as a part of the trach team f/u.  Alyson ReedyWesam G. Mariajose Downs, M.D. [facility]. Pager: [phone]. After hours pager: [phone].

## 2017-06-06 NOTE — Procedures (Signed)
Tracheostomy Change Note  Patient Details:   Name: Joseph Downs DOB: 20-Jul-1950 MRN: [id number]    Airway Documentation:     Evaluation  O2 sats: stable throughout Complications: No apparent complications Patient did tolerate procedure well. Bilateral Breath Sounds: Clear    Trach downsized from a #6cuffless Shiley to a #4cuffless Shiley with no complications. Color change with EZ-cap. Bilateral breath sounds. Patient resting comfortably.   Ancil BoozerSmallwood, Katarzyna Wolven 06/06/2017, 3:52 PM

## 2017-06-06 NOTE — Progress Notes (Signed)
Clinical Social Worker following patient for discharge needs. [place] [facility] [place] [facility] [place] [place] [facility] [place] [facility] into a [facility] for rehab. [facility] Peter MiniumRebecca Lundy reached out to CSW via phone and stated that they are unable to assist patient with rehab because patient has Medicare A. Lurena JoinerRebecca stated that the [facility] would only be able  to assist patient if he had no form of insurance.   CSW attempted to reach out to surrounding rehab facilities. [facility] but upon reviewing patients clinicals both facilities stated they will be unable to provide patient with the level of care he needs.  CSW has reached out to Chiropodistassistant director or social work and Wellsite geologistmedical director to get some assistance in placing patient.   Marrianne MoodAshley Cassia Fein, MSW,  [employer]WA [phone]

## 2017-06-06 NOTE — Plan of Care (Signed)
Continue current care plan 

## 2017-06-07 LAB — HEMOGLOBIN AND HEMATOCRIT, BLOOD
HEMATOCRIT: 32.4 % — AB (ref 39.0–52.0)
HEMOGLOBIN: 10.6 g/dL — AB (ref 13.0–17.0)

## 2017-06-07 MED ORDER — PANTOPRAZOLE SODIUM 40 MG PO PACK
40.0000 mg | PACK | Freq: Every day | ORAL | Status: DC
  Administered 2017-06-07 – 2017-06-12 (×6): 40 mg via ORAL
  Filled 2017-06-07 (×6): qty 20

## 2017-06-07 MED ORDER — SENNOSIDES-DOCUSATE SODIUM 8.6-50 MG PO TABS
1.0000 | ORAL_TABLET | Freq: Two times a day (BID) | ORAL | Status: DC
  Administered 2017-06-07 – 2017-06-13 (×12): 1 via ORAL
  Filled 2017-06-07 (×11): qty 1

## 2017-06-07 MED ORDER — OXYCODONE HCL 5 MG PO TABS
5.0000 mg | ORAL_TABLET | Freq: Four times a day (QID) | ORAL | Status: DC | PRN
  Administered 2017-06-07 – 2017-06-13 (×13): 10 mg via ORAL
  Filled 2017-06-07 (×13): qty 2

## 2017-06-07 NOTE — Progress Notes (Signed)
Pt seen by trach team for consult. No education needed at this time. All necessary equipment available at bedside. Will continue to monitor for progress. 

## 2017-06-07 NOTE — Progress Notes (Signed)
Nutrition Follow-up  DOCUMENTATION CODES:   Non-severe (moderate) malnutrition in context of social or environmental circumstances  INTERVENTION:   -Continue nocturnal feedings:Jevity 1.5@55ml /hr via PEGover 12 hour period  -Continue 30 ml Prostat BID  Tube feeding regimen provides1090kcal (54% of needs),57grams of protein, and59202ml of H2O.(Total free water: 1252ml)  -Continue15 ml liquid MVI daily viaPEG  -D/c Ensure Enlive po BID, each supplement provides 350 kcal and 20 grams of protein  NUTRITION DIAGNOSIS:   Moderate Malnutrition related to social / environmental circumstances as evidenced by mild fat depletion, mild muscle depletion.  Ongoing  GOAL:   Patient will meet greater than or equal to 90% of their needs  Progressing   MONITOR:   Diet advancement, Labs, Weight trends, Skin, I & O's, TF tolerance  REASON FOR ASSESSMENT:   Consult Enteral/tube feeding initiation and management  ASSESSMENT:   67 yo male with no PMH who was admitted on 2/13 after being found unresponsive outside by police with hypothermia, hypotension, elevated lactic acid. Required intubation on admission. Found to have extensive bilateral DVTs.  11/27- s/p trach 2/28- s/p PEG placement 3/6- transferred from ICU to SDU 3/8- s/p bilateral AKA 3/11- s/p FEES- advanced to a dysphagia 1 diet with honey thick liquids 3/17- pulled out trach; replaced 3/18- s/p BSE- advanced to dysphagia 3 diet with nectar thick liquids 3/19- s/p FEES- advanced to dysphagia 3 diet with thin liquids 3/21- trach downsized from #6 to #4 cuffless shiley  Reviewed I/O's: -125 ml x 24 hours and +7 L since admission   Pt is refusing Ensure Enlive supplements.   Pt working with therapies at time of visit.   Case discussed with RN, who reports intake is improving, but agrees with this RD that pt is not eating enough to justify discontinuing TF yet.   Pt continues on nocturnal feedings- Jevity  1.5 @ 55 ml/hr via PEG with 30 ml Prostat BID and 15 ml liquids MVI daily. Tube feeding regimen provides1090kcal (54% of needs),57grams of protein, and57802ml of H2O.(Total free water: 1252ml)  Labs reviewed: Na: 124, CBGS: 158.  Diet Order:  DIET DYS 3 Room service appropriate? Yes; Fluid consistency: Thin  EDUCATION NEEDS:   No education needs have been identified at this time  Skin:  Skin Assessment: Skin Integrity Issues: Skin Integrity Issues:: Stage II, Unstageable Stage II: worsening- advanced to unstageable Unstageable: sacrum Other: bilateral AKA incisions  Last BM:  06/06/17  Height:   Ht Readings from Last 1 Encounters:  05/14/17 5\' 10"  (1.778 m)    Weight:   Wt Readings from Last 1 Encounters:  06/04/17 218 lb (98.9 kg)    Ideal Body Weight:  63.4 kg  BMI:  Body mass index is 31.28 kg/m.  Estimated Nutritional Needs:   Kcal:  2000-2200  Protein:  110-125 grams  Fluid:  >2.0 L    Jariya Reichow A. Mayford KnifeWilliams, RD, LDN, CDE Pager: [phone] After hours Pager: [phone]

## 2017-06-07 NOTE — Progress Notes (Signed)
Clinical Social Worker following patient for discharge need and support. CSW is trying to place patient into a rehab facility. At this time 2 facilities are looking at patient to see if they are able to manage him. CSW will continue to follow patient until his medically ready.   Marrianne MoodAshley Faigy Stretch, MSW,  [employer] [phone]

## 2017-06-07 NOTE — Progress Notes (Signed)
PULMONARY MEDICINE follow up   Name: Joseph Downs MRN: [id number] DOB: [birth year]    ADMISSION DATE:  05/01/2017 CONSULTATION DATE:  05/01/17 REFERRING MD:  Dr. Deretha EmoryZackowski CHIEF COMPLAINT:  Found down  HISTORY OF PRESENT ILLNESS:   67 year old homeless man found down/13 with hypothermia, shock lactic acidosis. Found to have bilateral DVTs and evidence of RV strain on echo He had Acinetobacter in blood and staph in urine with elevated PSA. Unable to be weaned from vent, trach placed 2/27, plans to place PEG 2/28  STUDIES:  ECHO 2/14 - EF 65-70%, G1DD, RV strain Echo 2/21 - EF 55-60%, RV improved  CULTURES: MRSA PCR negative Ucx + staph lugdenensis Blood cx 2/13 >> Diphtheroids + Acinetobacter lwoffii Blood cx 2/19 >>no growth x 5 days Blood cx 2/20 >> no growth x 5 days  ANTIBIOTICS: Zosyn 2/13> 2/18 Vanc 2/13 >>2/19 Meropenem 2/18 >>  SIGNIFICANT EVENTS: 2/13 admitted, intubated 2/22 IVC filter placed 2/25 1u PRBC 2/27 Trach  LINES/TUBES: ETT 2/13 >> 2/27 Trach 2/27>>> Foley 2/13>>> PEG 2/28>>> Right IJ HD catheter 2/15 >>out  Left IJ CVC 2/15 >>out  SUBJECTIVE:  No events overnight, tolerated cap overnight without complications  Today's Vitals   06/07/17 0330 06/07/17 0413 06/07/17 0738 06/07/17 0744  BP:  129/86    Pulse:  96  92  Resp:  (!) 22  (!) 21  Temp:  98.3 F (36.8 C) 98.3 F (36.8 C)   TempSrc:  Oral Oral   SpO2:  99% 98% 98%  Weight:      Height:      PainSc: Asleep  0-No pain      Intake/Output Summary (Last 24 hours) at 06/07/2017 1119 Last data filed at 06/07/2017 1012 Gross per 24 hour  Intake 2030 ml  Output 3525 ml  Net -1495 ml   PHYSICAL EXAMINATION: General: Chronically ill appearing male, on TC with PMV, NAD HEENT: Cuffless 6 capped, [place]/AT, PERRL, EOM-I and MMM Pulmonary: CTA bilaterally Cardiac: RRR, Nl S1/S2 and -M/R/G. Abdomen: Soft, NT, ND and +BS Neuro/psych: Opens eyes and follows some  commands Extremities/musculoskeletal: Bilateral AKA- bandaged  LABS:  BMP Latest Ref Rng & Units 06/05/2017 06/04/2017 06/03/2017  Glucose 65 - 99 mg/dL 409(W144(H) 119(J153(H) 478(G153(H)  BUN 6 - 20 mg/dL 95(A23(H) 18 19  Creatinine 0.61 - 1.24 mg/dL 2.130.97 0.860.93 5.780.99  Sodium 135 - 145 mmol/L 134(L) 135 137  Potassium 3.5 - 5.1 mmol/L 4.7 4.7 4.2  Chloride 101 - 111 mmol/L 100(L) 102 104  CO2 22 - 32 mmol/L 26 24 24   Calcium 8.9 - 10.3 mg/dL 4.6(N8.4(L) 8.1(L) 8.1(L)     I reviewed CXR myself, trach is in good position  ASSESSMENT / PLAN: Acute respiratory failure on ventilator- trach 2/27 Likely pulmonary embolism with associated right heart strain Toxic metabolic encephalopathy Agitation Stress related non-ST elevation MI, suspect right heart strain Acute Cor pulmonale Acute non-oliguric renal failure; creatinine is improved some Rhabdomyolysis Fluid and electrolyte imbalance: Hypernatremia, hypokalemia, metabolic acidosis  Acinetobacter bacteremia. 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  [place]  Kathryne Sharper, M.D. [facility] Pulmonary/Critical Care Medicine. Pager: [phone]. After hours pager: [phone].

## 2017-06-07 NOTE — Progress Notes (Signed)
Occupational Therapy Treatment Patient Details Name: Joseph Downs MRN: [id number] DOB: [birth year] Today's Date: 06/07/2017    History of present illness Pt is a 67 year old man who was found down, unresponsive and hypothermic on 05/01/17. Developed respiratory failure and intubated 2/13. Pt also with encephalopathy, shock, NSTEMI, acute renal failure, bilateral DVTs, massive PE, Bil DVTs, massive PE, bactermeia. IVC filter placed 2/22. Extubated 2/26, but re-intubated later that day. Trach placed 2/27. Peg placed 2/28. CT 3/7 negative for acute intracranial abnormality. 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 No medical history on file.   OT comments  Pt progressing towards OT goals this session. Pt continues to require multimodal cues for attention and direction following. Able to perform long sitting in bed with +2 assist. Pt able to perform supported seated grooming. Session limited as respiratory therapy came in to de-cannulate. Discharge remains appropriate, and OT will continue to follow acutely.   Follow Up Recommendations  SNF;Supervision/Assistance - 24 hour    Equipment Recommendations    Defer to next venue of care   Recommendations for Other Services      Precautions / Restrictions Precautions Precautions: Fall Precaution Comments: Bilat AKAs, sacral wound Restrictions Weight Bearing Restrictions: Yes RLE Weight Bearing: Non weight bearing LLE Weight Bearing: Non weight bearing Other Position/Activity Restrictions: New B AKAs       Mobility Bed Mobility Overal bed mobility: Needs Assistance Bed Mobility: Supine to Sit;Rolling Rolling: Max assist(multimodal cues required)   Supine to sit: Mod assist;+2 for physical assistance(for long sitting for safety - avoided EOB) Sit to supine: Max assist;HOB elevated;+2 for physical assistance   General bed mobility comments: multimodal cues for attention and safety - multiple reminders of what was going on  Transfers                  General transfer comment: not attempted as repiratory therapy was coming in    Balance Overall balance assessment: Needs assistance Sitting-balance support: Bilateral upper extremity supported;Feet unsupported Sitting balance-Leahy Scale: Poor Sitting balance - Comments: Reliant on external support Postural control: Posterior lean                                 ADL either performed or assessed with clinical judgement   ADL Overall ADL's : Needs assistance/impaired     Grooming: Wash/dry face;Bed level;Minimal assistance Grooming Details (indicate cue type and reason): cues attention, able to complete without physical assist                     Toileting- Clothing Manipulation and Hygiene: Total assistance;Bed level Toileting - Clothing Manipulation Details (indicate cue type and reason): pt with bowel incontinence in bed and not aware that this had occurred             Vision       Perception     Praxis      Cognition Arousal/Alertness: Awake/alert Behavior During Therapy: WFL for tasks assessed/performed Overall Cognitive Status: Impaired/Different from baseline Area of Impairment: Attention;Following commands;Safety/judgement;Awareness;Problem solving                 Orientation Level: Disoriented to;Time;Situation Current Attention Level: Sustained   Following Commands: Follows one step commands with increased time;Follows one step commands inconsistently;Follows multi-step commands inconsistently Safety/Judgement: Decreased awareness of deficits;Decreased awareness of safety Awareness: Intellectual Problem Solving: Slow processing;Decreased initiation;Difficulty sequencing;Requires verbal cues General Comments: Pt requires multiple cues for mobility  and safety after mobility is complete. Pt complaining about pain from sacral wound        Exercises     Shoulder Instructions       General Comments decannulated during  session    Pertinent Vitals/ Pain       Pain Assessment: 0-10 Pain Score: 5  Pain Location: Sacral wound with repositioning Pain Descriptors / Indicators: Grimacing;Guarding;Sore Pain Intervention(s): Monitored during session;Repositioned  Home Living                                          Prior Functioning/Environment              Frequency  Min 2X/week        Progress Toward Goals  OT Goals(current goals can now be found in the care plan section)  Progress towards OT goals: Progressing toward goals  Acute Rehab OT Goals Patient Stated Goal: Decreased pain OT Goal Formulation: With patient Time For Goal Achievement: 06/11/17 Potential to Achieve Goals: Fair  Plan Discharge plan remains appropriate    Co-evaluation                 AM-PAC PT "6 Clicks" Daily Activity     Outcome Measure   Help from another person eating meals?: A Lot Help from another person taking care of personal grooming?: A Lot Help from another person toileting, which includes using toliet, bedpan, or urinal?: Total Help from another person bathing (including washing, rinsing, drying)?: Total Help from another person to put on and taking off regular upper body clothing?: A Lot Help from another person to put on and taking off regular lower body clothing?: Total 6 Click Score: 9    End of Session    OT Visit Diagnosis: Pain;Muscle weakness (generalized) (M62.81);Other symptoms and signs involving cognitive function Pain - Right/Left: (Bilateral) Pain - part of body: Leg(residual limb)   Activity Tolerance Patient limited by pain   Patient Left in bed;with bed alarm set;with call bell/phone within reach   Nurse Communication Mobility status        Time: 1610-9604 OT Time Calculation (min): 21 min  Charges: OT General Charges $OT Visit: 1 Visit OT Treatments $Self Care/Home Management : 8-22 mins  Sherryl Manges OTR/L [phone]  Evern Bio  Laycie Schriner 06/07/2017, 11:47 AM

## 2017-06-07 NOTE — Progress Notes (Signed)
Dressing changed per orders to Right AKA. Noted bloody drainage that saturated from packed site through several gauze and ABD pad. Change as directed. Mild visible drainage during dressing change.  Made both Vascular and Primary teams aware incase Hgb needs to be assess.    Patient mentation and Vital signs stable at this time. Will continue to monitor.

## 2017-06-07 NOTE — Progress Notes (Signed)
Joseph Downs, No Pcp Per  HPI/Recap of past 24 hours: GERMAIN KOOPMANN is a 67 yo M, reportedly homeless, no known past medical history other than obstructive uropathy requiring foley. Evidently, on 2/13, he was found down in the cold. He was brought to the ER, hypotensive, had lactate >7 despite 30 cc/kg IVF and re-warming. He was admitted to ICU, at that time was mentating enough to explain that he had passed out, not clear why. That night, he developed respiratory distress requiring intubation. He was treated with broad spectrum antibiotics for septic shock with hypotension, tachycardia and leukocytosis. He had renal failure, creatinine up to 7 and was started on CVVHD. Blood cultures grew Acineto lwoffii, urine cx frew staph lugdunensis. Echo showed severe RV strain and bilateral LE dopplers showed extensive DVT. CTA was not possible, nor catheterization/lysis due to AKI. He was started on heparin drip. At time of transfer out of ICU to [facility] service, Downs has tolerated trach collar. He has PEG. Cardiology, Nephrology, Vascular surgery, and Pulm were consulted. He is s/p B/L AKA due to frostbite.   06/05/17: Downs seen and examined at his bedside.  He denies any pain in his thumb.  Pain is well controlled on current management.  Denies any chest pain, dyspnea or palpitations.  06/06/2017: Downs seen and examined at his bedside.  He has no new complaints.  He is alert and oriented x3.  Pulmonary is planning on capping trach overnight with continuous O2 monitoring and if no desaturation overnight and no issues with secretion in the morning will consider decannulation.  06/07/17: seen and examined at his bedside. Right stump bleeding. On eliquis for DVT. Will check H&H.  Assessment/Plan: Principal Problem:   Sepsis ([place]) Active Problems:   Lactic acidemia   Acute respiratory failure (HCC)   Exposure to weather  condition   Hypotension   Non-ST elevation (NSTEMI) myocardial infarction (HCC)   Malnutrition of moderate degree   Acute cystitis with hematuria   Acute deep vein thrombosis (DVT) of proximal vein of both lower extremities (HCC)   Pressure injury of skin   Cardiogenic shock (HCC)   Acute cor pulmonale (HCC)   Acute respiratory failure with hypoxemia (HCC)   Deep vein thrombosis (DVT) of non-extremity vein   SIRS (systemic inflammatory response syndrome) (HCC)   S/P AKA (above knee amputation) bilateral (HCC)   Hypernatremia   Acute blood loss anemia   Encephalopathy   Postoperative pain   Pulmonary embolus (HCC)   Tracheostomy status (HCC)  Sepsis secondary to Pseudomonas UTI Completed 7 days of IV Zosyn Afebrile in the last 24 hours. Continue to monitor fever curve  Leukocytosis White count is trending down Possibly reactive from recent surgery  pro-calcitonin less than 0.10 Afebrile  Right stump, concern for wound dehiscence Vascular surgery following Persistent bleeding from site. H&H did not show significant drop in hemoglobin Downs is currently on Eliquis for lower extremity recent acute DVT  Frostbite gangrene of feet Vascular surgery following Wound care specialist following POD #13 post bilateral above-knee amputations Continue regular dressing changes  Acute blood loss anemia/normocytic anemia Status post bilateral AKA on 05/24/2017 Has been transfused 2 units PRBCs on 05/27/2017 Hemoglobin stable  Acute hypoxic respiratory failure status post trach collar P CCM followed and signed off on 06/07/2017 Maintain O2 saturation 92% or greater Continue decannulation  Bilateral lower extremity acute DVT Status post IVC filter IVC filter placed due to  Downs's history of noncompliance Continue Eliquis  Presumed massive pulmonary embolism Echocardiogram done on 05/02/2017 revealed right heart strain with high suspicion for pulmonary embolism  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   Code Status: Full.  Family Communication: None at bedside.  Disposition Plan: SNF when hemodynamically stable.  Possibly tomorrow if no acute events overnight.   Consultants:  Vascular surgery Orthopedic surgery Speech therapy  Procedures:  Bilateral above-the-knee amputation  Antimicrobials:  None.  DVT prophylaxis:  SCDs.  Eliquis  Objective: Vitals:   06/07/17 0738 06/07/17 0744 06/07/17 1110 06/07/17 1136  BP:    134/85  Pulse:  92 (!) 102 (!) 102  Resp:  (!) 21 (!) 25 20  Temp: 98.3 F (36.8 C)   98.7 F (37.1 C)  TempSrc: Oral   Oral  SpO2: 98% 98% 98% 97%  Weight:      Height:        Intake/Output Summary (Last 24 hours) at 06/07/2017 1212 Last data filed at 06/07/2017 1012 Gross per 24 hour  Intake 2030 ml  Output 3525 ml  Net -1495 ml   Filed Weights   05/31/17 0300 06/04/17 0450  Weight: 84.4 kg (186 lb) 98.9 kg (218 lb)    Exam: 06/07/2017.  Downs seen and examined at his bedside.  Physical exam is essentially unchanged from prior except for what is mentioned below.   General: 67 year old African-American male well-developed well-nourished in no acute distress.  Above-the-knee amputation.  Alert and oriented x3.  Trach collar in place.  Cardiovascular: Regular rate and rhythm with no rubs or gallops.  Respiratory: Clear to auscultation with no wheezes or rales.  Abdomen: Nontender nondistended with PEG tube in place at left upper quadrant.  Normal bowel sounds x4.    Musculoskeletal:  Right stump is bleeding.  Staples still in place.      Skin: As noted above  Psychiatry: Mood is appropriate for condition and setting   Data Reviewed: CBC: Recent Labs  Lab 06/02/17 0228 06/03/17 0314 06/04/17 0812 06/05/17 0331 06/06/17 0530 06/07/17 1129  WBC 19.3* 16.8* 22.3* 22.4* 21.5*  --   HGB 8.1* 7.6* 8.4* 8.9* 9.4* 10.6*  HCT 25.9* 23.9* 27.0* 28.8* 29.8* 32.4*  MCV 89.9 90.5 91.2 90.9 91.4  --   PLT 439* 411* 531* 558* 623*  --    Basic Metabolic Panel: Recent Labs  Lab 06/01/17 0306 06/02/17 0228 06/03/17 0314 06/04/17 0812 06/05/17 0331  NA 139 140 137 135 134*  K 3.9 4.0 4.2 4.7 4.7  CL 105 105 104 102 100*  CO2 _0 GLUCOSE 140* 139* 153* 153* 144*  BUN 24* 22* 19 18 23*  CREATININE 1.00 1.08 0.99 0.93 0.97  CALCIUM 8.0* 8.2* 8.1* 8.1* 8.4*   GFR: Estimated Creatinine Clearance: 88.4 mL/min (by C-G formula based on SCr of 0.97 mg/dL). Liver Function Tests: No results for input(s): AST, ALT, ALKPHOS, BILITOT, PROT, ALBUMIN in the last 168 hours. No results for input(s): LIPASE, AMYLASE in the last 168 hours. No results for input(s): AMMONIA in the last 168 hours. Coagulation Profile: No results for input(s): INR, PROTIME  in the last 168 hours. Cardiac Enzymes: No results for input(s): CKTOTAL, CKMB, CKMBINDEX, TROPONINI in the last 168 hours. BNP (last 3 results) No results for input(s): PROBNP in the last 8760 hours. HbA1C: No results for input(s): HGBA1C in the last 72 hours. CBG: Recent Labs  Lab 06/06/17 0822  GLUCAP 158*   Lipid Profile: No results for input(s): CHOL, HDL, LDLCALC, TRIG, CHOLHDL, LDLDIRECT in the last 72 hours. Thyroid Function Tests: No results for input(s): TSH, T4TOTAL, FREET4, T3FREE, THYROIDAB in the last 72 hours. Anemia Panel: No results for input(s): VITAMINB12, FOLATE, FERRITIN, TIBC, IRON, RETICCTPCT in the last 72 hours. Urine analysis:    Component Value Date/Time   COLORURINE YELLOW  05/25/2017 0654   APPEARANCEUR CLEAR 05/25/2017 0654   LABSPEC 1.013 05/25/2017 0654   PHURINE 6.0 05/25/2017 0654   GLUCOSEU 50 (A) 05/25/2017 0654   HGBUR MODERATE (A) 05/25/2017 0654   BILIRUBINUR NEGATIVE 05/25/2017 0654   KETONESUR NEGATIVE 05/25/2017 0654   PROTEINUR NEGATIVE 05/25/2017 0654   NITRITE POSITIVE (A) 05/25/2017 0654   LEUKOCYTESUR TRACE (A) 05/25/2017 0654   Sepsis Labs: _0 (procalcitonin:4,lacticidven:4)  )No results found for this or any previous visit (from the past 240 hour(s)).    Studies: No results found.  Scheduled Meds: . apixaban  5 mg Oral BID  . aspirin  81 mg Per Tube Daily  . chlorhexidine gluconate (MEDLINE KIT)  15 mL Mouth Rinse BID  . collagenase   Topical Daily  . feeding supplement (ENSURE ENLIVE)  237 mL Oral BID BM  . feeding supplement (PRO-STAT SUGAR FREE 64)  30 mL Per Tube BID  . free water  250 mL Per Tube Q8H  . haloperidol  1 mg Oral Daily  . haloperidol  2 mg Oral QHS  . mouth rinse  15 mL Mouth Rinse QID  . Melatonin  6 mg Oral QHS  . multivitamin  15 mL Oral Daily  . pantoprazole sodium  40 mg Oral QHS  . senna-docusate  1 tablet Oral BID  . thiamine  100 mg Per Tube Daily    Continuous Infusions: . feeding supplement (JEVITY 1.5 CAL/FIBER) Stopped (06/07/17 0930)  . magnesium sulfate 1 - 4 g bolus IVPB       LOS: 37 days     Kayleen Memos, MD [facility] Pager [phone]  If 7PM-7AM, please contact night-coverage [email or web address] Password [email or web address] 06/07/2017, 12:12 PM

## 2017-06-08 LAB — CBC
HEMATOCRIT: 31.9 % — AB (ref 39.0–52.0)
Hemoglobin: 10.2 g/dL — ABNORMAL LOW (ref 13.0–17.0)
MCH: 29.1 pg (ref 26.0–34.0)
MCHC: 32 g/dL (ref 30.0–36.0)
MCV: 91.1 fL (ref 78.0–100.0)
PLATELETS: 546 10*3/uL — AB (ref 150–400)
RBC: 3.5 MIL/uL — AB (ref 4.22–5.81)
RDW: 18.2 % — ABNORMAL HIGH (ref 11.5–15.5)
WBC: 17.8 10*3/uL — ABNORMAL HIGH (ref 4.0–10.5)

## 2017-06-08 LAB — COMPREHENSIVE METABOLIC PANEL
ALK PHOS: 290 U/L — AB (ref 38–126)
ALT: 70 U/L — AB (ref 17–63)
AST: 33 U/L (ref 15–41)
Albumin: 2.4 g/dL — ABNORMAL LOW (ref 3.5–5.0)
Anion gap: 9 (ref 5–15)
BILIRUBIN TOTAL: 0.6 mg/dL (ref 0.3–1.2)
BUN: 27 mg/dL — AB (ref 6–20)
CALCIUM: 8.3 mg/dL — AB (ref 8.9–10.3)
CHLORIDE: 97 mmol/L — AB (ref 101–111)
CO2: 25 mmol/L (ref 22–32)
Creatinine, Ser: 1.18 mg/dL (ref 0.61–1.24)
Glucose, Bld: 141 mg/dL — ABNORMAL HIGH (ref 65–99)
Potassium: 4.9 mmol/L (ref 3.5–5.1)
Sodium: 131 mmol/L — ABNORMAL LOW (ref 135–145)
TOTAL PROTEIN: 7.2 g/dL (ref 6.5–8.1)

## 2017-06-08 MED ORDER — POLYETHYLENE GLYCOL 3350 17 G PO PACK
17.0000 g | PACK | Freq: Every day | ORAL | Status: DC
Start: 2017-06-08 — End: 2017-06-13
  Administered 2017-06-08 – 2017-06-13 (×6): 17 g via ORAL
  Filled 2017-06-08 (×6): qty 1

## 2017-06-08 MED ORDER — HEPARIN (PORCINE) IN NACL 100-0.45 UNIT/ML-% IJ SOLN
1350.0000 [IU]/h | INTRAMUSCULAR | Status: DC
  Administered 2017-06-09: 1500 [IU]/h via INTRAVENOUS
  Administered 2017-06-10: 1200 [IU]/h via INTRAVENOUS
  Administered 2017-06-12: 1250 [IU]/h via INTRAVENOUS
  Administered 2017-06-12: 1100 [IU]/h via INTRAVENOUS
  Filled 2017-06-08 (×5): qty 250

## 2017-06-08 MED ORDER — DIPHENHYDRAMINE HCL 50 MG/ML IJ SOLN
50.0000 mg | Freq: Once | INTRAMUSCULAR | Status: AC
  Administered 2017-06-08: 50 mg via INTRAVENOUS
  Filled 2017-06-08: qty 1

## 2017-06-08 MED ORDER — ADULT MULTIVITAMIN W/MINERALS CH
1.0000 | ORAL_TABLET | Freq: Every day | ORAL | Status: DC
  Administered 2017-06-08 – 2017-06-13 (×6): 1 via ORAL
  Filled 2017-06-08 (×6): qty 1

## 2017-06-08 NOTE — Progress Notes (Signed)
[facility]  Called to see pt for bleeding from right AKA.  Has had ongoing oozing from aka for several days.  Pt is on oral anticoagulation for DVT.  He had bilateral akas about 2 weeks ago.  He has had no hemodynamic instability or drop in hemoglobin   Objective (!) 143/92 89 98.7 F (37.1 C) (Oral) 14 95%  Intake/Output Summary (Last 24 hours) at 06/08/2017 1509 Last data filed at 06/08/2017 1340 Gross per 24 hour  Intake 1870 ml  Output 2775 ml  Net -905 ml   Right AKA with large hematoma about 5 x 7 cm.  Several staples removed to allow removal of all hematoma contents.  Now has open wound 4 cm length.  Very large hematoma cavity within the aka, no obvious ongoing bleeding.  Assessment/Planning: Hematoma right AKA made problematic by anticoagulation Will stop oral anticoagulant now and switch back to heparin which will be easier to control Wound care NS wet to dry right aka Will transition to [facility] in the next several days as wound cleans up.  Fabienne BrunsCharles Kitara Hebb 06/08/2017 3:09 PM --  Laboratory Lab Results: Recent Labs    06/06/17 0530 06/07/17 1129 06/08/17 0826  WBC 21.5*  --  17.8*  HGB 9.4* 10.6* 10.2*  HCT 29.8* 32.4* 31.9*  PLT 623*  --  546*   BMET Recent Labs    06/08/17 0826  NA 131*  K 4.9  CL 97*  CO2 25  GLUCOSE 141*  BUN 27*  CREATININE 1.18  CALCIUM 8.3*    COAG Lab Results  Component Value Date   INR 1.21 05/10/2017   No results found for: PTT

## 2017-06-08 NOTE — Progress Notes (Signed)
ANTICOAGULATION CONSULT NOTE - Initial Consult  Pharmacy Consult for apixaban > heparin Indication: DVT  No Known Allergies  Patient Measurements: Height: 5\' 10"  (177.8 cm) Weight: 218 lb (98.9 kg) IBW/kg (Calculated) : 73 Heparin Dosing Weight: 75 kg  Vital Signs: Temp: 98.7 F (37.1 C) (03/23 1120) Temp Source: Oral (03/23 1120) BP: 143/92 (03/23 1120) Pulse Rate: 89 (03/23 1120)  Labs: Recent Labs    06/06/17 0530 06/07/17 1129 06/08/17 0826  HGB 9.4* 10.6* 10.2*  HCT 29.8* 32.4* 31.9*  PLT 623*  --  546*  CREATININE  --   --  1.18    Estimated Creatinine Clearance: 72.6 mL/min (by C-G formula based on SCr of 1.18 mg/dL).   Medical History: History reviewed. No pertinent past medical history.    Assessment: 89 yoM previously on IV heparin for VTE treatment transitioned to apixaban on 3/16. Pt noted to have R AKA hematoma. Per [facility] plan to switch back to IV heparin on 3/24 with no bolus to allow hematoma and wound to heal. CBC stable today, last dose of apixaban at 0930 today. Heparin previously therapeutic at 1850 units/hr - given new hematoma will dose more conservatively.  Goal of Therapy:  Heparin level 0.3-0.5 units/ml Monitor platelets by anticoagulation protocol: Yes   Plan:  -Stop apixaban -Initiate heparin at 1500 units/hr with no bolus starting 3/24 at 0800 -Check 6-hr heparin level -Monitor daily heparin level, CBC, S/Sx bleeding  Fredonia HighlandMichael Bitonti, PharmD, BCPS PGY-2 Cardiology Pharmacy Resident Pager: [phone] 06/08/2017

## 2017-06-08 NOTE — Progress Notes (Signed)
PROGRESS NOTE  Joseph Downs GGY:[id number] DOB: July 14, 1950 DOA: 05/01/2017 PCP: Patient, No Pcp Per  HPI/Recap of past 24 hours: Joseph Downs is a 67 yo M, reportedly homeless, no known past medical history other than obstructive uropathy requiring foley. Evidently, on 2/13, he was found down in the cold. He was brought to the ER, hypotensive, had lactate >7 despite 30 cc/kg IVF and re-warming. He was admitted to ICU, at that time was mentating enough to explain that he had passed out, not clear why. That night, he developed respiratory distress requiring intubation. He was treated with broad spectrum antibiotics for septic shock with hypotension, tachycardia and leukocytosis. He had renal failure, creatinine up to 7 and was started on CVVHD. Blood cultures grew Acineto lwoffii, urine cx frew staph lugdunensis. Echo showed severe RV strain and bilateral LE dopplers showed extensive DVT. CTA was not possible, nor catheterization/lysis due to AKI. He was started on heparin drip. At time of transfer out of ICU to [facility] service, patient has tolerated trach collar. He has PEG. Cardiology, Nephrology, Vascular surgery, and Pulm were consulted. He is s/p B/L AKA due to frostbite.   06/05/17: Patient seen and examined at his bedside.  He denies any pain in his thumb.  Pain is well controlled on current management.  Denies any chest pain, dyspnea or palpitations.  06/06/2017: Patient seen and examined at his bedside.  He has no new complaints.  He is alert and oriented x3.  Pulmonary is planning on capping trach overnight with continuous O2 monitoring and if no desaturation overnight and no issues with secretion in the morning will consider decannulation.  06/07/17: seen and examined at his bedside. Right stump bleeding. On eliquis for DVT. Will check H&H.  06/08/17: no new complaints. Persistent bleeding from right stump. Vascular surgery informed. Order to stop eliquis. VSS will see today. Highly  appreciated.  Assessment/Plan: Principal Problem:   Sepsis ([place]) Active Problems:   Lactic acidemia   Acute respiratory failure (HCC)   Exposure to weather condition   Hypotension   Non-ST elevation (NSTEMI) myocardial infarction (HCC)   Malnutrition of moderate degree   Acute cystitis with hematuria   Acute deep vein thrombosis (DVT) of proximal vein of both lower extremities (HCC)   Pressure injury of skin   Cardiogenic shock (HCC)   Acute cor pulmonale (HCC)   Acute respiratory failure with hypoxemia (HCC)   Deep vein thrombosis (DVT) of non-extremity vein   SIRS (systemic inflammatory response syndrome) (HCC)   S/P AKA (above knee amputation) bilateral (HCC)   Hypernatremia   Acute blood loss anemia   Encephalopathy   Postoperative pain   Pulmonary embolus (HCC)   Tracheostomy status (HCC)  Sepsis secondary to Pseudomonas UTI Completed 7 days of IV Zosyn Afebrile in the last 24 hours. Continue to monitor fever curve  Leukocytosis White count is trending down Possibly reactive from recent surgery  pro-calcitonin less than 0.10 Afebrile  Right stump, concern for wound dehiscence Vascular surgery will see today Persistent bleeding from site. H&H did not show significant drop in hemoglobin Patient is currently on Eliquis for lower extremity recent acute DVT Stop eliquis as requested by vascular surgery 06/08/17  Frostbite gangrene of feet Vascular surgery following Wound care specialist following POD #14 post bilateral above-knee amputations Continue regular dressing changes  Acute blood loss anemia/normocytic anemia Status post bilateral AKA on 05/24/2017 Has been transfused 2 units PRBCs on 05/27/2017 Monitor H&H  Acute hypoxic respiratory failure status post  trach collar which was removed 06/07/17 P CCM followed and signed off on 06/07/2017 Maintain O2 saturation 92% or greater Continue decannulation  Bilateral lower extremity acute DVT Status post IVC  filter IVC filter placed due to patient's history of noncompliance  Presumed massive pulmonary embolism Echocardiogram done on 05/02/2017 revealed right heart strain with high suspicion for pulmonary embolism  AK I on CKD 3 Stable Avoid nephrotoxic agents/hypotension  Hyponatremia Resolved  Dysphagia status post PEG tube placement Improving Patient has been able to tolerate the dysphagia 3 mechanical soft thin liquid diet  NSTEMI Elevated troponin possibly related to pulmonary embolism  Cardiology consulted  Ambulatory dysfunction secondary to bilateral lower extremity amputation Continue PT Awaiting placement at SNF to continue rehab.   Code Status: Full.  Family Communication: None at bedside.  Disposition Plan: SNF when hemodynamically stable.  Possibly tomorrow if no acute events overnight.   Consultants:  Vascular surgery Orthopedic surgery Speech therapy  Procedures:  Bilateral above-the-knee amputation  Antimicrobials:  None.  DVT prophylaxis:  SCDs.   Objective: Vitals:   06/08/17 0049 06/08/17 0346 06/08/17 0741 06/08/17 1120  BP: 123/76 (!) 159/89 (!) 153/93 (!) 143/92  Pulse:   95 89  Resp: 17 (!) 24 (!) 23 14  Temp:  98.2 F (36.8 C) 98.3 F (36.8 C) 98.7 F (37.1 C)  TempSrc:  Oral Oral Oral  SpO2:  98% 99% 95%  Weight:      Height:        Intake/Output Summary (Last 24 hours) at 06/08/2017 1313 Last data filed at 06/08/2017 0900 Gross per 24 hour  Intake 1750 ml  Output 2825 ml  Net -1075 ml   Filed Weights   05/31/17 0300 06/04/17 0450  Weight: 84.4 kg (186 lb) 98.9 kg (218 lb)    Exam: 06/08/2017.  Patient seen and examined at his bedside.  Physical exam is essentially unchanged from prior except for what is mentioned below.   General: 67 yo AAM b/l LE amputee. A&O x 3  Cardiovascular: Regular rate and rhythm with no rubs or gallops.  Respiratory: CTA no wheezes or rales.  Abdomen: Nontender nondistended with PEG tube  in place at left upper quadrant.  Normal bowel sounds x4.    Musculoskeletal: right stump bleeding. Staples in place.    Skin: As noted above  Psychiatry: Mood is appropriate for condition and setting   Data Reviewed: CBC: Recent Labs  Lab 06/03/17 0314 06/04/17 0812 06/05/17 0331 06/06/17 0530 06/07/17 1129 06/08/17 0826  WBC 16.8* 22.3* 22.4* 21.5*  --  17.8*  HGB 7.6* 8.4* 8.9* 9.4* 10.6* 10.2*  HCT 23.9* 27.0* 28.8* 29.8* 32.4* 31.9*  MCV 90.5 91.2 90.9 91.4  --  91.1  PLT 411* 531* 558* 623*  --  952*   Basic Metabolic Panel: Recent Labs  Lab 06/02/17 0228 06/03/17 0314 06/04/17 0812 06/05/17 0331 06/08/17 0826  NA 140 137 135 134* 131*  K 4.0 4.2 4.7 4.7 4.9  CL 105 104 102 100* 97*  CO2 25 24 24 26 25   GLUCOSE 139* 153* 153* 144* 141*  BUN 22* 19 18 23* 27*  CREATININE 1.08 0.99 0.93 0.97 1.18  CALCIUM 8.2* 8.1* 8.1* 8.4* 8.3*   GFR: Estimated Creatinine Clearance: 72.6 mL/min (by C-G formula based on SCr of 1.18 mg/dL). Liver Function Tests: Recent Labs  Lab 06/08/17 0826  AST 33  ALT 70*  ALKPHOS 290*  BILITOT 0.6  PROT 7.2  ALBUMIN 2.4*   No results for input(s):  LIPASE, AMYLASE in the last 168 hours. No results for input(s): AMMONIA in the last 168 hours. Coagulation Profile: No results for input(s): INR, PROTIME in the last 168 hours. Cardiac Enzymes: No results for input(s): CKTOTAL, CKMB, CKMBINDEX, TROPONINI in the last 168 hours. BNP (last 3 results) No results for input(s): PROBNP in the last 8760 hours. HbA1C: No results for input(s): HGBA1C in the last 72 hours. CBG: Recent Labs  Lab 06/06/17 0822  GLUCAP 158*   Lipid Profile: No results for input(s): CHOL, HDL, LDLCALC, TRIG, CHOLHDL, LDLDIRECT in the last 72 hours. Thyroid Function Tests: No results for input(s): TSH, T4TOTAL, FREET4, T3FREE, THYROIDAB in the last 72 hours. Anemia Panel: No results for input(s): VITAMINB12, FOLATE, FERRITIN, TIBC, IRON, RETICCTPCT in the  last 72 hours. Urine analysis:    Component Value Date/Time   COLORURINE YELLOW 05/25/2017 0654   APPEARANCEUR CLEAR 05/25/2017 0654   LABSPEC 1.013 05/25/2017 0654   PHURINE 6.0 05/25/2017 0654   GLUCOSEU 50 (A) 05/25/2017 0654   HGBUR MODERATE (A) 05/25/2017 0654   BILIRUBINUR NEGATIVE 05/25/2017 0654   KETONESUR NEGATIVE 05/25/2017 0654   PROTEINUR NEGATIVE 05/25/2017 0654   NITRITE POSITIVE (A) 05/25/2017 0654   LEUKOCYTESUR TRACE (A) 05/25/2017 0654   Sepsis Labs: @LABRCNTIP (procalcitonin:4,lacticidven:4)  )No results found for this or any previous visit (from the past 240 hour(s)).    Studies: No results found.  Scheduled Meds: . aspirin  81 mg Per Tube Daily  . chlorhexidine gluconate (MEDLINE KIT)  15 mL Mouth Rinse BID  . collagenase   Topical Daily  . feeding supplement (PRO-STAT SUGAR FREE 64)  30 mL Per Tube BID  . free water  250 mL Per Tube Q8H  . haloperidol  1 mg Oral Daily  . haloperidol  2 mg Oral QHS  . mouth rinse  15 mL Mouth Rinse QID  . Melatonin  6 mg Oral QHS  . multivitamin  15 mL Oral Daily  . pantoprazole sodium  40 mg Oral QHS  . polyethylene glycol  17 g Oral Daily  . senna-docusate  1 tablet Oral BID  . thiamine  100 mg Per Tube Daily    Continuous Infusions: . feeding supplement (JEVITY 1.5 CAL/FIBER) Stopped (06/08/17 0946)  . magnesium sulfate 1 - 4 g bolus IVPB       LOS: 38 days     Kayleen Memos, MD [facility] Pager [phone]  If 7PM-7AM, please contact night-coverage [email or web address] Password [facility] 06/08/2017, 1:13 PM

## 2017-06-08 NOTE — Progress Notes (Signed)
Pt c/o itching all over, requests something for itch.  Paged Linton FlemingsX. Blount NP for orders.  Waiting call back.

## 2017-06-08 NOTE — Progress Notes (Signed)
Nutrition Follow-up  DOCUMENTATION CODES:   Non-severe (moderate) malnutrition in context of social or environmental circumstances  INTERVENTION:   -Initiated 48 hour calorie count; RD will follow-up on Monday 3/25 to interpret results  -Continuenocturnal feedings:Jevity 1.5@55ml /hr via PEGover 12 hour period  -Continue 30 ml Prostat BID  Tube feeding regimen provides1090kcal (54% of needs),57grams of protein, and56702ml of H2O.(Total free water: 1252ml)  -D/c15 ml liquid MVI daily viaPEG  -MVI daily  NUTRITION DIAGNOSIS:   Moderate Malnutrition related to social / environmental circumstances as evidenced by mild fat depletion, mild muscle depletion.  Ongoing  GOAL:   Patient will meet greater than or equal to 90% of their needs  Progressing  MONITOR:   Diet advancement, Labs, Weight trends, Skin, I & O's, TF tolerance  REASON FOR ASSESSMENT:   Consult Assessment of nutrition requirement/status, Calorie Count  ASSESSMENT:   67 yo male with no PMH who was admitted on 2/13 after being found unresponsive outside by police with hypothermia, hypotension, elevated lactic acid. Required intubation on admission. Found to have extensive bilateral DVTs.  11/27- s/p trach 2/28- s/p PEG placement 3/6- transferred from ICU to SDU 3/8- s/p bilateral AKA 3/11- s/p FEES- advanced to a dysphagia 1 diet with honey thick liquids 3/17- pulled out trach; replaced 3/18- s/p BSE- advanced to dysphagia 3 diet with nectar thick liquids 3/19- s/p FEES- advanced to dysphagia 3 diet with thin liquids 3/21- trach downsized from #6 to #4 cuffless shiley 3/23- decannulated  Reviewed I/O's: -1.1 L x 24 hours and +4 L since admission.   RD re-consulted to wean off TF. RD has been following pt very closely over the past 2 weeks to assess oral intake and need for TF. Per discussion with RN yesterday (06/07/17), intake continues to wax and wane and pt is not ready to discontinue  TF yet. Reviewed meal intake data from 06/06/17-06/07/17; pt consumed 917 kcals and 38 grams protein on 06/06/17 (46% of estimated kcal needs 35% of estimated protein needs).   Pt consumed about 40% of breakfast this morning (500 kcals, 14 grams of protein). Noted pt also consumed one bite of cream pie from [employer] at bedside.   Pt continues on nocturnal feedings- Jevity 1.5 @ 55 ml/hr via PEG with 30 ml Prostat BID and 15 ml liquids MVI daily.Tube feeding regimen provides1090kcal (54% of needs),57grams of protein, and55102ml of H2O.(Total free water: 1252ml)  Labs reviewed: Na: 134, CBGS: 158.   Diet Order:  DIET DYS 3 Room service appropriate? Yes; Fluid consistency: Thin  EDUCATION NEEDS:   No education needs have been identified at this time  Skin:  Skin Assessment: Skin Integrity Issues: Skin Integrity Issues:: Stage II, Unstageable Stage II: worsening- advanced to unstageable Unstageable: sacrum Other: bilateral AKA incisions  Last BM:  06/06/17  Height:   Ht Readings from Last 1 Encounters:  05/14/17 5\' 10"  (1.778 m)    Weight:   Wt Readings from Last 1 Encounters:  06/04/17 218 lb (98.9 kg)    Ideal Body Weight:  63.4 kg  BMI:  Body mass index is 31.28 kg/m.  Estimated Nutritional Needs:   Kcal:  2000-2200  Protein:  110-125 grams  Fluid:  >2.0 L    Vaudie Engebretsen A. Mayford KnifeWilliams, RD, LDN, CDE Pager: [phone] After hours Pager: [phone]

## 2017-06-09 LAB — HEPARIN LEVEL (UNFRACTIONATED): HEPARIN UNFRACTIONATED: 1.1 [IU]/mL — AB (ref 0.30–0.70)

## 2017-06-09 LAB — CBC
HCT: 32 % — ABNORMAL LOW (ref 39.0–52.0)
Hemoglobin: 10.3 g/dL — ABNORMAL LOW (ref 13.0–17.0)
MCH: 29.6 pg (ref 26.0–34.0)
MCHC: 32.2 g/dL (ref 30.0–36.0)
MCV: 92 fL (ref 78.0–100.0)
Platelets: 593 10*3/uL — ABNORMAL HIGH (ref 150–400)
RBC: 3.48 MIL/uL — AB (ref 4.22–5.81)
RDW: 18.3 % — AB (ref 11.5–15.5)
WBC: 20.1 10*3/uL — AB (ref 4.0–10.5)

## 2017-06-09 LAB — APTT: APTT: 92 s — AB (ref 24–36)

## 2017-06-09 MED ORDER — CAMPHOR-MENTHOL 0.5-0.5 % EX LOTN
TOPICAL_LOTION | CUTANEOUS | Status: DC | PRN
  Filled 2017-06-09: qty 222

## 2017-06-09 NOTE — Progress Notes (Signed)
ANTICOAGULATION CONSULT NOTE - Follow-Up Consult  Pharmacy Consult for apixaban > heparin Indication: DVT  No Known Allergies  Patient Measurements: Height: 5\' 10"  (177.8 cm) Weight: 218 lb (98.9 kg) IBW/kg (Calculated) : 73 Heparin Dosing Weight: 75 kg  Vital Signs: Temp: 98.9 F (37.2 C) (03/24 1546) Temp Source: Oral (03/24 1546) BP: 124/79 (03/24 1548) Pulse Rate: 108 (03/24 1548)  Labs: Recent Labs    06/07/17 1129 06/08/17 0826 06/09/17 0208 06/09/17 1522  HGB 10.6* 10.2* 10.3*  --   HCT 32.4* 31.9* 32.0*  --   PLT  --  546* 593*  --   APTT  --   --   --  92*  CREATININE  --  1.18  --   --     Estimated Creatinine Clearance: 72.6 mL/min (by C-G formula based on SCr of 1.18 mg/dL).  Assessment: [age] yoM previously on IV heparin for VTE treatment transitioned to apixaban on 3/16. Pt noted to have R AKA hematoma. Per [facility] plan to switch back to IV heparin on 3/24 with no bolus to allow hematoma and wound to heal. CBC stable today, last dose of apixaban at 0930 today. Heparin previously therapeutic at 1850 units/hr - given new hematoma will dose more conservatively.  Heparin resumed this morning at 0830. aPTT at 1552 came back slightly above goal range at 92. Heparin level falsely elevated from recent apixaban dosing so will continue to monitor aPTTs. No infusion issues or issues with bleeding (wound is still oozing but stable).  Goal of Therapy:  APTT 66-85 sec Heparin level 0.3-0.5 units/ml Monitor platelets by anticoagulation protocol: Yes   Plan:  -Decrease heparin to 1450 units/hr -Check 6-hr heparin aPTT -Monitor daily heparin level/aPTT, CBC, S/Sx bleeding -F/U transition back to apixaban  Girard CooterKimberly Perkins, PharmD Clinical Pharmacist  Pager: [phone] Clinical Phone for 06/09/2017 until 3:30pm: [phone] If after 3:30pm, please call main pharmacy at [phone] 06/09/2017

## 2017-06-09 NOTE — Progress Notes (Signed)
Placed pt in hoyer lift with pad to transfer to chair, sat pt in chair and pt complained of pain and tried to slide down. Pad was still under pt and attached to lift. Pt stated he could not sit on bottom due to pain from pressure ulcer and would have to lay down and turn on side to be able to tolerate pain. Pain medication given to pt before attempting to move to chair. Pt stated it was not helping. Pt moved back to bed.

## 2017-06-09 NOTE — Progress Notes (Signed)
PROGRESS NOTE  AHNAF CAPONI JQB:[id number] DOB: 1950-04-18 DOA: 05/01/2017 PCP: Patient, No Pcp Per  HPI/Recap of past 24 hours: Joseph Downs is a 67 yo M, reportedly homeless, no known past medical history other than obstructive uropathy requiring foley. Evidently, on 2/13, he was found down in the cold. He was brought to the ER, hypotensive, had lactate >7 despite 30 cc/kg IVF and re-warming. He was admitted to ICU, at that time was mentating enough to explain that he had passed out, not clear why. That night, he developed respiratory distress requiring intubation. He was treated with broad spectrum antibiotics for septic shock with hypotension, tachycardia and leukocytosis. He had renal failure, creatinine up to 7 and was started on CVVHD. Blood cultures grew Acineto lwoffii, urine cx frew staph lugdunensis. Echo showed severe RV strain and bilateral LE dopplers showed extensive DVT. CTA was not possible, nor catheterization/lysis due to AKI. He was started on heparin drip. At time of transfer out of ICU to [facility] service, patient has tolerated trach collar. He has PEG. Cardiology, Nephrology, Vascular surgery, and Pulm were consulted. He is s/p B/L AKA due to frostbite.   06/05/17: Patient seen and examined at his bedside.  He denies any pain in his thumb.  Pain is well controlled on current management.  Denies any chest pain, dyspnea or palpitations.  06/06/2017: Patient seen and examined at his bedside.  He has no new complaints.  He is alert and oriented x3.  Pulmonary is planning on capping trach overnight with continuous O2 monitoring and if no desaturation overnight and no issues with secretion in the morning will consider decannulation.  06/07/17: seen and examined at his bedside. Right stump bleeding. On eliquis for DVT. Will check H&H.  06/08/17: no new complaints. Persistent bleeding from right stump. Vascular surgery informed. Order to stop eliquis. VSS will see today. Highly  appreciated.  06/09/17: Reports itching at his left above the knee amputation. Sarna added. No chest pain, dyspnea or palpitations.  Assessment/Plan: Principal Problem:   Sepsis ([place]) Active Problems:   Lactic acidemia   Acute respiratory failure (HCC)   Exposure to weather condition   Hypotension   Non-ST elevation (NSTEMI) myocardial infarction (HCC)   Malnutrition of moderate degree   Acute cystitis with hematuria   Acute deep vein thrombosis (DVT) of proximal vein of both lower extremities (HCC)   Pressure injury of skin   Cardiogenic shock (HCC)   Acute cor pulmonale (HCC)   Acute respiratory failure with hypoxemia (HCC)   Deep vein thrombosis (DVT) of non-extremity vein   SIRS (systemic inflammatory response syndrome) (HCC)   S/P AKA (above knee amputation) bilateral (HCC)   Hypernatremia   Acute blood loss anemia   Encephalopathy   Postoperative pain   Pulmonary embolus (HCC)   Tracheostomy status (HCC)  Sepsis secondary to Pseudomonas UTI Completed 7 days of IV Zosyn Afebrile in the last 24 hours. Continue to monitor fever curve  Presumed right AKA hematoma Eliquis stopped 06/08/17 per vascular surgery, Dr Biagio Quint, request Vascular surgery following. Highly appreciated Continue wound care  Leukocytosis Possibly reactive from recent surgery  pro-calcitonin less than 0.10 Afebrile  Bilateral lower extremity acute DVT Status post IVC filter IVC filter placed due to patient's history of noncompliance  Presumed massive pulmonary embolism Echocardiogram done on 05/02/2017 revealed right heart strain with high suspicion for pulmonary embolism  Right stump, concern for wound dehiscence Vascular surgery following Stop eliquis as requested by vascular surgery 06/08/17  Frostbite  gangrene of feet Vascular surgery following Wound care specialist following POD #15 post bilateral above-knee amputations Continue regular dressing changes  Acute blood loss  anemia/normocytic anemia Status post bilateral AKA on 05/24/2017 Has been transfused 2 units PRBCs on 05/27/2017 Monitor H&H  Acute hypoxic respiratory failure status post trach collar which was removed 06/07/17 Resolved Breathing comfortably on ambient air P CCM followed and signed off on 06/07/2017 Maintain O2 saturation 92% or greater Continue decannulation  AK I on CKD 3 Stable Avoid nephrotoxic agents/hypotension  Hyponatremia Resolved  Dysphagia status post PEG tube placement Improving Patient has been able to tolerate the dysphagia 3 mechanical soft thin liquid diet  NSTEMI Elevated troponin possibly related to pulmonary embolism  Cardiology consulted  Ambulatory dysfunction secondary to bilateral lower extremity amputation Continue PT Awaiting placement at SNF to continue rehab.   Code Status: Full.  Family Communication: None at bedside.  Disposition Plan: SNF when hemodynamically stable.  Possibly tomorrow if no acute events overnight.   Consultants:  Vascular surgery Orthopedic surgery Speech therapy  Procedures:  Bilateral above-the-knee amputation  Antimicrobials:  None.  DVT prophylaxis:  SCDs.   Objective: Vitals:   06/09/17 0118 06/09/17 0344 06/09/17 0813 06/09/17 1124  BP: (!) 143/84 (!) 130/92 (!) 152/84 133/72  Pulse: (!) 110 (!) 106 (!) 103 100  Resp: 20 (!) 28 (!) 21 (!) 24  Temp: 98.4 F (36.9 C) 98.2 F (36.8 C) 98.9 F (37.2 C) 98.6 F (37 C)  TempSrc: Oral Oral Oral Oral  SpO2: 99% 97% 100% 97%  Weight:      Height:        Intake/Output Summary (Last 24 hours) at 06/09/2017 1254 Last data filed at 06/09/2017 1200 Gross per 24 hour  Intake 2089.25 ml  Output 2150 ml  Net -60.75 ml   Filed Weights   05/31/17 0300 06/04/17 0450  Weight: 84.4 kg (186 lb) 98.9 kg (218 lb)    Exam: 06/09/2017.  Patient seen and examined at his bedside.  Physical exam is essentially unchanged from prior except for what is mentioned  below.   General: 67 yo AAM WD WN NAD A&O x 3.  Cardiovascular: RRR no rubs or gallops  Respiratory: CTA no wheezes or rales  Abdomen: soft NT ND NBS x4. Peg tube in place  Musculoskeletal: surgical wrap at right stump. Staples in place at left stump.  Skin: As noted above  Psychiatry: Mood is appropriate for condition and setting   Data Reviewed: CBC: Recent Labs  Lab 06/04/17 0812 06/05/17 0331 06/06/17 0530 06/07/17 1129 06/08/17 0826 06/09/17 0208  WBC 22.3* 22.4* 21.5*  --  17.8* 20.1*  HGB 8.4* 8.9* 9.4* 10.6* 10.2* 10.3*  HCT 27.0* 28.8* 29.8* 32.4* 31.9* 32.0*  MCV 91.2 90.9 91.4  --  91.1 92.0  PLT 531* 558* 623*  --  546* 546*   Basic Metabolic Panel: Recent Labs  Lab 06/03/17 0314 06/04/17 0812 06/05/17 0331 06/08/17 0826  NA 137 135 134* 131*  K 4.2 4.7 4.7 4.9  CL 104 102 100* 97*  CO2 24 24 26 25   GLUCOSE 153* 153* 144* 141*  BUN 19 18 23* 27*  CREATININE 0.99 0.93 0.97 1.18  CALCIUM 8.1* 8.1* 8.4* 8.3*   GFR: Estimated Creatinine Clearance: 72.6 mL/min (by C-G formula based on SCr of 1.18 mg/dL). Liver Function Tests: Recent Labs  Lab 06/08/17 0826  AST 33  ALT 70*  ALKPHOS 290*  BILITOT 0.6  PROT 7.2  ALBUMIN 2.4*  No results for input(s): LIPASE, AMYLASE in the last 168 hours. No results for input(s): AMMONIA in the last 168 hours. Coagulation Profile: No results for input(s): INR, PROTIME in the last 168 hours. Cardiac Enzymes: No results for input(s): CKTOTAL, CKMB, CKMBINDEX, TROPONINI in the last 168 hours. BNP (last 3 results) No results for input(s): PROBNP in the last 8760 hours. HbA1C: No results for input(s): HGBA1C in the last 72 hours. CBG: Recent Labs  Lab 06/06/17 0822  GLUCAP 158*   Lipid Profile: No results for input(s): CHOL, HDL, LDLCALC, TRIG, CHOLHDL, LDLDIRECT in the last 72 hours. Thyroid Function Tests: No results for input(s): TSH, T4TOTAL, FREET4, T3FREE, THYROIDAB in the last 72 hours. Anemia  Panel: No results for input(s): VITAMINB12, FOLATE, FERRITIN, TIBC, IRON, RETICCTPCT in the last 72 hours. Urine analysis:    Component Value Date/Time   COLORURINE YELLOW 05/25/2017 0654   APPEARANCEUR CLEAR 05/25/2017 0654   LABSPEC 1.013 05/25/2017 0654   PHURINE 6.0 05/25/2017 0654   GLUCOSEU 50 (A) 05/25/2017 0654   HGBUR MODERATE (A) 05/25/2017 0654   BILIRUBINUR NEGATIVE 05/25/2017 0654   KETONESUR NEGATIVE 05/25/2017 0654   PROTEINUR NEGATIVE 05/25/2017 0654   NITRITE POSITIVE (A) 05/25/2017 0654   LEUKOCYTESUR TRACE (A) 05/25/2017 0654   Sepsis Labs: @LABRCNTIP (procalcitonin:4,lacticidven:4)  )No results found for this or any previous visit (from the past 240 hour(s)).    Studies: No results found.  Scheduled Meds: . aspirin  81 mg Per Tube Daily  . chlorhexidine gluconate (MEDLINE KIT)  15 mL Mouth Rinse BID  . collagenase   Topical Daily  . feeding supplement (PRO-STAT SUGAR FREE 64)  30 mL Per Tube BID  . free water  250 mL Per Tube Q8H  . haloperidol  1 mg Oral Daily  . haloperidol  2 mg Oral QHS  . mouth rinse  15 mL Mouth Rinse QID  . Melatonin  6 mg Oral QHS  . multivitamin with minerals  1 tablet Oral Daily  . pantoprazole sodium  40 mg Oral QHS  . polyethylene glycol  17 g Oral Daily  . senna-docusate  1 tablet Oral BID  . thiamine  100 mg Per Tube Daily    Continuous Infusions: . feeding supplement (JEVITY 1.5 CAL/FIBER) Stopped (06/09/17 0910)  . heparin 1,500 Units/hr (06/09/17 1200)  . magnesium sulfate 1 - 4 g bolus IVPB       LOS: 39 days     Kayleen Memos, MD [facility] Pager [phone]  If 7PM-7AM, please contact night-coverage [email or web address] Password [facility] 06/09/2017, 12:54 PM

## 2017-06-09 NOTE — Progress Notes (Signed)
[facility]  Subjective  - still sore   Objective (!) 130/92 (!) 103 98.9 F (37.2 C) (Oral) (!) 28 100%  Intake/Output Summary (Last 24 hours) at 06/09/2017 0825 Last data filed at 06/09/2017 0600 Gross per 24 hour  Intake 2035 ml  Output 2200 ml  Net -165 ml   Right AKA clean no further hematoma Left aka clean  Assessment/Planning: Hematoma right aka Probably VAC tomorrow Transition back to oral anticoagulation in a few days if no other bleeding  Joseph Downs 06/09/2017 8:25 AM --  Laboratory Lab Results: Recent Labs    06/08/17 0826 06/09/17 0208  WBC 17.8* 20.1*  HGB 10.2* 10.3*  HCT 31.9* 32.0*  PLT 546* 593*   BMET Recent Labs    06/08/17 0826  NA 131*  K 4.9  CL 97*  CO2 25  GLUCOSE 141*  BUN 27*  CREATININE 1.18  CALCIUM 8.3*    COAG Lab Results  Component Value Date   INR 1.21 05/10/2017   No results found for: PTT

## 2017-06-09 NOTE — Progress Notes (Signed)
ANTICOAGULATION CONSULT NOTE - Follow-Up Consult  Pharmacy Consult for apixaban > heparin Indication: DVT  No Known Allergies  Patient Measurements: Height: 5\' 10"  (177.8 cm) Weight: 218 lb (98.9 kg) IBW/kg (Calculated) : 73 Heparin Dosing Weight: 75 kg  Vital Signs: Temp: 98.9 F (37.2 C) (03/24 0813) Temp Source: Oral (03/24 0813) BP: 152/84 (03/24 0813) Pulse Rate: 103 (03/24 0813)  Labs: Recent Labs    06/07/17 1129 06/08/17 0826 06/09/17 0208  HGB 10.6* 10.2* 10.3*  HCT 32.4* 31.9* 32.0*  PLT  --  546* 593*  CREATININE  --  1.18  --     Estimated Creatinine Clearance: 72.6 mL/min (by C-G formula based on SCr of 1.18 mg/dL).   Medical History: History reviewed. No pertinent past medical history.    Assessment: [age] yoM previously on IV heparin for VTE treatment transitioned to apixaban on 3/16. Pt noted to have R AKA hematoma. Per VVS plan to switch back to IV heparin on 3/24 with no bolus to allow hematoma and wound to heal. CBC stable today, last dose of apixaban at 0930 today. Heparin previously therapeutic at 1850 units/hr - given new hematoma will dose more conservatively.  Heparin resumed this morning at 0830 - will check aPTT/HL in 6-hr. Per VVS, likely to resume OAC in a few days if bleeding/hematoma stable.  Goal of Therapy:  Heparin level 0.3-0.5 units/ml Monitor platelets by anticoagulation protocol: Yes   Plan:  -Initiate heparin at 1500 units/hr with no bolus -Check 6-hr heparin level/aPTT -Monitor daily heparin level/aPTT, CBC, S/Sx bleeding -F/U transition back to apixaban  Fredonia HighlandMichael Bitonti, PharmD, BCPS PGY-2 Cardiology Pharmacy Resident Pager: [phone] 06/09/2017

## 2017-06-10 LAB — CBC
HEMATOCRIT: 30.1 % — AB (ref 39.0–52.0)
HEMOGLOBIN: 9.8 g/dL — AB (ref 13.0–17.0)
MCH: 29.8 pg (ref 26.0–34.0)
MCHC: 32.6 g/dL (ref 30.0–36.0)
MCV: 91.5 fL (ref 78.0–100.0)
Platelets: 292 10*3/uL (ref 150–400)
RBC: 3.29 MIL/uL — ABNORMAL LOW (ref 4.22–5.81)
RDW: 18.6 % — AB (ref 11.5–15.5)
WBC: 14.7 10*3/uL — AB (ref 4.0–10.5)

## 2017-06-10 LAB — BASIC METABOLIC PANEL
ANION GAP: 13 (ref 5–15)
BUN: 28 mg/dL — ABNORMAL HIGH (ref 6–20)
CALCIUM: 8.4 mg/dL — AB (ref 8.9–10.3)
CHLORIDE: 97 mmol/L — AB (ref 101–111)
CO2: 22 mmol/L (ref 22–32)
Creatinine, Ser: 1.09 mg/dL (ref 0.61–1.24)
GFR calc non Af Amer: >60 mL/min (ref >60)
Glucose, Bld: 145 mg/dL — ABNORMAL HIGH (ref 65–99)
Potassium: 5.3 mmol/L — ABNORMAL HIGH (ref 3.5–5.1)
Sodium: 132 mmol/L — ABNORMAL LOW (ref 135–145)

## 2017-06-10 LAB — APTT
APTT: 79 s — AB (ref 24–36)
APTT: 93 s — AB (ref 24–36)
APTT: 99 s — AB (ref 24–36)
aPTT: 129 seconds — ABNORMAL HIGH (ref 24–36)

## 2017-06-10 LAB — HEPARIN LEVEL (UNFRACTIONATED): HEPARIN UNFRACTIONATED: 0.94 [IU]/mL — AB (ref 0.30–0.70)

## 2017-06-10 MED ORDER — ENSURE ENLIVE PO LIQD
237.0000 mL | Freq: Three times a day (TID) | ORAL | Status: DC
  Administered 2017-06-10 – 2017-06-13 (×9): 237 mL via ORAL

## 2017-06-10 NOTE — Progress Notes (Signed)
Physical Therapy Treatment Patient Details Name: Joseph Downs MRN: [id number] DOB: 01-30-1951 Today's Date: 06/10/2017    History of Present Illness Pt is a 67 year old man who was found down, unresponsive and hypothermic on 05/01/17. Developed respiratory failure and intubated 2/13. Pt also with encephalopathy, shock, NSTEMI, acute renal failure, bilateral DVTs, massive PE, Bil DVTs, massive PE, bactermeia. IVC filter placed 2/22. Extubated 2/26, but re-intubated later that day. Trach placed 2/27. Peg placed 2/28. CT 3/7 negative for acute intracranial abnormality. 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 No medical history on file.    PT Comments    Pt with limited progress today primarily due to pain from sacral wound. Pt unable to tolerate sitting on wound.    Follow Up Recommendations  SNF;Supervision/Assistance - 24 hour     Equipment Recommendations  Other (comment)(To be determined)    Recommendations for Other Services       Precautions / Restrictions Precautions Precautions: Fall Precaution Comments: Bilat AKAs, sacral wound Restrictions Weight Bearing Restrictions: Yes RLE Weight Bearing: Non weight bearing LLE Weight Bearing: Non weight bearing Other Position/Activity Restrictions: New B AKAs    Mobility  Bed Mobility Overal bed mobility: Needs Assistance Bed Mobility: Supine to Sit;Rolling;Sit to Sidelying Rolling: Min assist   Supine to sit: Mod assist;+2 for physical assistance   Sit to sidelying: +2 for physical assistance;Min assist General bed mobility comments: For rolling pt requires cues for technique and min assist to bring shoulders over. For supine to sit assist to elevate trunk and pt limited to coming into full upright sitting due to pain from sacral wound. Returning to sidelying pt only requires assist to guide trunk and hips  Transfers                 General transfer comment: Unable due to sacral pain  Ambulation/Gait                  Stairs            Wheelchair Mobility    Modified Rankin (Stroke Patients Only)       Balance Overall balance assessment: Needs assistance Sitting-balance support: Bilateral upper extremity supported;Feet unsupported Sitting balance-Leahy Scale: Poor Sitting balance - Comments: min to mod assist. Incr assist needed due to pt leaning to avoid pressure on sacral wound                                    Cognition Arousal/Alertness: Awake/alert Behavior During Therapy: WFL for tasks assessed/performed Overall Cognitive Status: Impaired/Different from baseline Area of Impairment: Attention;Following commands;Safety/judgement;Awareness;Problem solving;Memory                 Orientation Level: Disoriented to;Time;Situation Current Attention Level: Sustained Memory: Decreased short-term memory Following Commands: Follows one step commands with increased time;Follows one step commands inconsistently;Follows multi-step commands inconsistently Safety/Judgement: Decreased awareness of deficits;Decreased awareness of safety Awareness: Intellectual Problem Solving: Slow processing;Decreased initiation;Difficulty sequencing;Requires verbal cues General Comments: After session finished pt told rehab tech he thought he would do better with crutches. Pt seems to forget he is bil amputee.      Exercises Amputee Exercises Hip Extension: AAROM;Left;5 reps;Sidelying    General Comments        Pertinent Vitals/Pain Pain Assessment: Faces Faces Pain Scale: Hurts whole lot Pain Location: Sacral wound with repositioning Pain Descriptors / Indicators: Grimacing;Guarding;Sore Pain Intervention(s): Monitored during session;Limited activity within patient's tolerance;Repositioned  Home Living                      Prior Function            PT Goals (current goals can now be found in the care plan section) Progress towards PT goals: Not progressing  toward goals - comment(due to sacral pain)    Frequency    Min 2X/week      PT Plan Current plan remains appropriate    Co-evaluation              AM-PAC PT "6 Clicks" Daily Activity  Outcome Measure  Difficulty turning over in bed (including adjusting bedclothes, sheets and blankets)?: Unable Difficulty moving from lying on back to sitting on the side of the bed? : Unable Difficulty sitting down on and standing up from a chair with arms (e.g., wheelchair, bedside commode, etc,.)?: Unable Help needed moving to and from a bed to chair (including a wheelchair)?: Total Help needed walking in hospital room?: Total Help needed climbing 3-5 steps with a railing? : Total 6 Click Score: 6    End of Session   Activity Tolerance: Patient limited by pain Patient left: in bed;with call bell/phone within reach;with bed alarm set Nurse Communication: Mobility status PT Visit Diagnosis: Other abnormalities of gait and mobility (R26.89);Muscle weakness (generalized) (M62.81);Pain Pain - part of body: (sacrum)     Time: 1030-1046 PT Time Calculation (min) (ACUTE ONLY): 16 min  Charges:  $Therapeutic Activity: 8-22 mins                    G Codes:       [facility] PT [phone]    Angelina Ok [facility] 06/10/2017, 12:26 PM

## 2017-06-10 NOTE — Consult Note (Signed)
WOC nurse consulted for sacral pressure injury, this has been evaluated per [facility] nurse with orders written on 05/28/17.  Continue enzymatic debridement.  However upon my arrival to the unit, bedside nurse reported he needs NPWT dressing to the stump wound.  WOC Nurse wound consult note Reason for Consult: placement of NPWT to the right stump wound Wound type: surgical  Pressure Injury POA:NA Measurement: Wound ZOX:WRUEA, pink Drainage (amount, consistency, odor) minimal, bloody Periwound:staple along closed incision on medial and lateral sides of open wound Dressing procedure/placement/frequency: Filled wound with  __2____ pieces of black foam. Sealed NPWT dressing at HG/194mmHG  Patient received PO pain medication per bedside nurse prior to dressing change Patient tolerated procedure well  Ok for bedside nurse to change non-complex NPWT dressing M/W/F    Discussed POC with patient and bedside nurse.  Re consult if needed, will not follow at this time. Thanks  Eisen Robenson [employer], RN,CWOCN, CNS, CWON-AP [phone])

## 2017-06-10 NOTE — Care Management (Signed)
[facility]  Plan for discharge continues to be SNF

## 2017-06-10 NOTE — Progress Notes (Addendum)
Calorie Count Note  48 hour calorie count ordered.  Diet: Dysphagia 3 with thin liquids Supplements: Nocturnal feedings: Jevity 1.5@55ml /hr via PEGover 12 hour period; 30 ml Prostat BID; MVI daily  Spoke with pt at bedside, who was more alert from when RD last spoke with pt last week. He reports he enjoys receiving solid food and reports his appetite continues to improve. He reports he consumed some eggs, french toast, and fruit yesterday for breakfast, but claims he did not receive a tray today. Noted multiple snacks on bedside table (fruit, strawberries, and honey buns), but pt denies eating them, stating they are for his family members.   Per vascular surgery note, plan for wound vac placement on rt AKA wound.  Case discussed with RN, who confirms pt with inconsistent intake and continues to be most alert during mornings (becomes more lethargic as the day progresses). Family members will occasionally visit pt and feed him snacks. RN agrees with decision to continue nocturnal feedings. Plan to continue to closely monitor intake; discussed potential to transition to bolus feeding regimen or transition off TF if intake becomes more consistent throughout the week.   06/08/17 Breakfast: 500 kcals, 14 grams Lunch: 335 kcals, 11 grams protein Dinner: 153 kcals, 7 grams protein  Total intake (via PO's only): 988 kcal (49% of minimum estimated needs)  32 grams protein (29% of minimum estimated needs)  Total intake (PO + TF): 2078 kcal (100% of minimum estimated needs)  89 grams protein (81% of minimum estimated needs)  06/09/17 Breakfast: 168 kcals, 5 grams protein Lunch: 120 kcals, 2 grams protein Dinner: 0 kcals, 0 grams protein  Total intake (via PO's only): 288 kcal (14% of minimum estimated needs)  7 grams protein (6% of minimum estimated needs)  Total intake (PO + TF): 1378 kcal (69% of minimum estimated needs)  64 grams protein (58% of minimum estimated needs)  Average Total  intake (via PO's only): 638 kcal (32% of minimum estimated needs)  20 grams protein (18% of minimum estimated needs)  Average Total intake (PO + TF)): 1728 kcal (86% of minimum estimated needs)  77 grams protein (70% of minimum estimated needs)  Nutrition Dx:  Moderate Malnutrition related to social / environmental circumstances as evidenced by mild fat depletion, mild muscle depletion; ongoing  Goal: Patient will meet greater than or equal to 90% of their needs  Intervention:   -D/c calorie count; given pt's inconsistent oral intake (only meeting 32% of estimated kcal needs by mouth on average) will continue TF -Continue MVI daily -D/c 30 ml Prostat BID -Continuenocturnal feedings:Jevity 1.5@55ml /hr via PEGover 12 hour period  Tube feeding regimen provides890kcal (45% of needs),27grams of protein, and55102ml of H2O.(Total free water: 1252ml) -Ensure Enlive po TID, each supplement provides 350 kcal and 20 grams of protein  Euna Armon A. Mayford KnifeWilliams, RD, LDN, CDE Pager: [phone] After hours Pager: [phone]

## 2017-06-10 NOTE — Progress Notes (Signed)
ANTICOAGULATION CONSULT NOTE  Pharmacy Consult for apixaban > heparin Indication: DVT  No Known Allergies  Patient Measurements: Height: 5\' 10"  (177.8 cm) Weight: 218 lb (98.9 kg) IBW/kg (Calculated) : 73 Heparin Dosing Weight: 75 kg  Vital Signs: Temp: 98.9 F (37.2 C) (03/24 2300) Temp Source: Oral (03/24 2300) BP: 123/78 (03/24 2300) Pulse Rate: 95 (03/24 2300)  Labs: Recent Labs    06/07/17 1129 06/08/17 0826 06/09/17 0208 06/09/17 1522 06/09/17 2352  HGB 10.6* 10.2* 10.3*  --   --   HCT 32.4* 31.9* 32.0*  --   --   PLT  --  546* 593*  --   --   APTT  --   --   --  92* 129*  HEPARINUNFRC  --   --   --  1.10*  --   CREATININE  --  1.18  --   --   --     Estimated Creatinine Clearance: 72.6 mL/min (by C-G formula based on SCr of 1.18 mg/dL).  Assessment: 89 yoM previously on IV heparin for VTE treatment transitioned to apixaban on 3/16. Pt noted to have R AKA hematoma. Per VVS plan to switch back to IV heparin on 3/24 with no bolus to allow hematoma and wound to heal. CBC stable today, last dose of apixaban at 0930 today. Heparin previously therapeutic at 1850 units/hr - given new hematoma will dose more conservatively.  Heparin resumed this morning at 0830. Tonight's aPTT is supratherapeutic. Heparin level is falsely elevated from recent apixaban dosing so will continue to monitor aPTTs. No infusion issues or issues with bleeding.  Goal of Therapy:  APTT 66-85 sec Heparin level 0.3-0.5 units/ml Monitor platelets by anticoagulation protocol: Yes   Plan:  -Decrease heparin to 1250 units/hr -Check 6-hr heparin aPTT -Monitor daily heparin level/aPTT, CBC, S/Sx bleeding -F/U transition back to apixaban    Agapito GamesAlison Georgeanne Frankland, PharmD, BCPS Clinical Pharmacist 06/10/2017 1:02 AM

## 2017-06-10 NOTE — Progress Notes (Signed)
ANTICOAGULATION CONSULT NOTE  Pharmacy Consult for apixaban > heparin Indication: DVT  No Known Allergies  Patient Measurements: Height: 5\' 10"  (177.8 cm) Weight: 218 lb (98.9 kg) IBW/kg (Calculated) : 73 Heparin Dosing Weight: 75 kg  Vital Signs: Temp: 99.5 F (37.5 C) (03/25 2109) Temp Source: Oral (03/25 2109) BP: 134/84 (03/25 2109) Pulse Rate: 95 (03/25 2109)  Labs: Recent Labs    06/08/17 0826 06/09/17 0208 06/09/17 1522  06/10/17 0816 06/10/17 1413 06/10/17 2031  HGB 10.2* 10.3*  --   --  9.8*  --   --   HCT 31.9* 32.0*  --   --  30.1*  --   --   PLT 546* 593*  --   --  292  --   --   APTT  --   --  92*   < > 79* 93* 99*  HEPARINUNFRC  --   --  1.10*  --  0.94*  --   --   CREATININE 1.18  --   --   --  1.09  --   --    < > = values in this interval not displayed.    Estimated Creatinine Clearance: 78.6 mL/min (by C-G formula based on SCr of 1.09 mg/dL).  Assessment: [age] yoM previously on IV heparin for VTE treatment transitioned to apixaban on 3/16. Pt noted to have R AKA hematoma. Per VVS plan to switch back to IV heparin on 3/24 with no bolus to allow hematoma and wound to heal. Last dose of apixaban at 0930 on 3/24. Given new hematoma, will dose heparin more conservatively.  aPTT now therapeutic at 99, supratherapeutic on 1200 units/hr. Drawn from opposite arm per RN. No bleeding reported.   Goal of Therapy:  APTT 66-85 sec Heparin level 0.3-0.5 units/ml Monitor platelets by anticoagulation protocol: Yes   Plan:  -Decrease heparin rate to 1050 units/hr -f/u AM labs   Bayard HuggerMei Leron Stoffers, PharmD, BCPS  Clinical Pharmacist  Pager: [phone]

## 2017-06-10 NOTE — Progress Notes (Signed)
Answered a page to assist family in Power of Erwin[place]orney paperwork.  When I arrived at the room the sister was requesting assistance with financial power of attorney so that she could get some money out of the bank.  This Chaplain shared with patient and patient's sister that we do not get involved with financial matters but would be happy to assist with healthcare paperwork.  Patient's sister stated that she already had medical power of attorney taken care of.  Shared information with the nurse.    06/10/17 1553  Clinical Encounter Type  Visited With Patient;Family  Visit Type Initial;Spiritual support

## 2017-06-10 NOTE — Progress Notes (Addendum)
  Progress Note    06/10/2017 7:42 AM 17 Days Post-Op  Subjective:  Pt sleeping; per RN, dressing changed at 6am.   Afebrile  Vitals:   06/09/17 2300 06/10/17 0235  BP: 123/78 (!) 136/59  Pulse: 95 92  Resp: 13 (!) 27  Temp: 98.9 F (37.2 C) 98.5 F (36.9 C)  SpO2: 98% 100%    Physical Exam: General:  Sleeping soundly; did not wake pt. Incisions:   Dressing right stump is clean; left stump looks good with staples in tact.   CBC    Component Value Date/Time   WBC 20.1 (H) 06/09/2017 0208   RBC 3.48 (L) 06/09/2017 0208   HGB 10.3 (L) 06/09/2017 0208   HCT 32.0 (L) 06/09/2017 0208   PLT 593 (H) 06/09/2017 0208   MCV 92.0 06/09/2017 0208   MCH 29.6 06/09/2017 0208   MCHC 32.2 06/09/2017 0208   RDW 18.3 (H) 06/09/2017 0208   LYMPHSABS 1.7 05/17/2017 2300   MONOABS 0.3 05/17/2017 2300   EOSABS 1.1 (H) 05/17/2017 2300   BASOSABS 0.0 05/17/2017 2300    BMET    Component Value Date/Time   NA 131 (L) 06/08/2017 0826   K 4.9 06/08/2017 0826   CL 97 (L) 06/08/2017 0826   CO2 25 06/08/2017 0826   GLUCOSE 141 (H) 06/08/2017 0826   BUN 27 (H) 06/08/2017 0826   CREATININE 1.18 06/08/2017 0826   CALCIUM 8.3 (L) 06/08/2017 0826   GFRNONAA >60 06/08/2017 0826   GFRAA >60 06/08/2017 0826    INR    Component Value Date/Time   INR 1.21 05/10/2017 0650     Intake/Output Summary (Last 24 hours) at 06/10/2017 0742 Last data filed at 06/10/2017 [id number] Gross per 24 hour  Intake 1482.75 ml  Output 3050 ml  Net -1567.25 ml     Assessment/Plan:  67 y.o. male is s/p bilaterally above knee amputation  17 Days Post-Op Some Staples removed from right stump on Saturday and hematoma evacuated by Dr. Darrick PennaFields.  -pt sleeping this am and I did not wake him.  Per RN, dressing changed at 6am.  -pt's WBC up yesterday but down from previous 2 days but pt remains afebrile; wound yesterday was clean.  Zosyn discontinued on 3/18. -RN reports there is no further bleeding from the right  stump despite heparin being started.   -Dr. Edilia Boickson to see the pt later today. -check CBC in am.  Has order for CBC today but not resulted.     Doreatha MassedSamantha Rhyne, PA-C [facility] [phone] 06/10/2017 7:42 AM  I have interviewed the patient and examined the patient. I agree with the findings by the PA. The right AKA wound is fairly clean.  I agree with plans for placement of negative pressure dressing.  Cari Carawayhris Bleu Minerd, MD [phone]

## 2017-06-10 NOTE — Progress Notes (Signed)
CSW checked in with 2 possible snfs. They would like proof that pt's [facility] and want the Medicaid worker's name.   Osborne Cascoadia Letisia Schwalb LCSW [phone]

## 2017-06-10 NOTE — Progress Notes (Addendum)
ANTICOAGULATION CONSULT NOTE  Pharmacy Consult for apixaban > heparin Indication: DVT  No Known Allergies  Patient Measurements: Height: 5\' 10"  (177.8 cm) Weight: 218 lb (98.9 kg) IBW/kg (Calculated) : 73 Heparin Dosing Weight: 75 kg  Vital Signs: Temp: 98.7 F (37.1 C) (03/25 0714) Temp Source: Oral (03/25 0714) BP: 136/59 (03/25 0235) Pulse Rate: 92 (03/25 0235)  Labs: Recent Labs    06/07/17 1129 06/08/17 0826 06/09/17 0208 06/09/17 1522 06/09/17 2352  HGB 10.6* 10.2* 10.3*  --   --   HCT 32.4* 31.9* 32.0*  --   --   PLT  --  546* 593*  --   --   APTT  --   --   --  92* 129*  HEPARINUNFRC  --   --   --  1.10*  --   CREATININE  --  1.18  --   --   --     Estimated Creatinine Clearance: 72.6 mL/min (by C-G formula based on SCr of 1.18 mg/dL).  Assessment: [age] yoM previously on IV heparin for VTE treatment transitioned to apixaban on 3/16. Pt noted to have R AKA hematoma. Per VVS plan to switch back to IV heparin on 3/24 with no bolus to allow hematoma and wound to heal. Last dose of apixaban at 0930 on 3/24. Given new hematoma, will dose heparin more conservatively.  aPTT now therapeutic at 79 after rate decrease. Heparin level is falsely elevated from recent apixaban dosing, so will continue to monitor aPTTs. Hg low stable, plt down to 292. No issues with bleeding documented.  Goal of Therapy:  APTT 66-85 sec Heparin level 0.3-0.5 units/ml Monitor platelets by anticoagulation protocol: Yes   Plan:  -Continue heparin at 1250 units/hr -Check 6-hr heparin aPTT to confirm -Monitor daily heparin level/aPTT, CBC, S/Sx bleeding -F/U transition back to apixaban as appropriate   Babs BertinHaley Tishanna Dunford, PharmD, BCPS Clinical Pharmacist Clinical phone for 06/10/2017 until 3:30pm: [phone] If after 3:30pm, please call main pharmacy at: [phone] 06/10/2017 8:55 AM  ADDENDUM: Confirmatory aPTT now slightly high at 93. No bleed or issues with the infusion per discussion with  RN.  Plan: -Decrease heparin to 1200 units/hr -Check 6-hr aPTT -Monitor daily heparin level/aPTT, CBC, S/Sx bleeding -F/U transition back to apixaban as appropriate  Babs BertinHaley Atzin Buchta, PharmD, BCPS Clinical Pharmacist 06/10/2017 2:57 PM

## 2017-06-10 NOTE — Progress Notes (Addendum)
PROGRESS NOTE  Joseph Downs IWL:[id number] DOB: 1950/04/26 DOA: 05/01/2017 PCP: Patient, No Pcp Per  HPI/Recap of past 24 hours: Joseph Downs is a 67 yo M, reportedly homeless, no known past medical history other than obstructive uropathy requiring foley. Evidently, on 2/13, he was found down in the cold. He was brought to the ER, hypotensive, had lactate >7 despite 30 cc/kg IVF and re-warming. He was admitted to ICU, at that time was mentating enough to explain that he had passed out, not clear why. That night, he developed respiratory distress requiring intubation. He was treated with broad spectrum antibiotics for septic shock with hypotension, tachycardia and leukocytosis. He had renal failure, creatinine up to 7 and was started on CVVHD. Blood cultures grew Acineto lwoffii, urine cx frew staph lugdunensis. Echo showed severe RV strain and bilateral LE dopplers showed extensive DVT. CTA was not possible, nor catheterization/lysis due to AKI. He was started on heparin drip. At time of transfer [facility] of [facility], patient has tolerated trach collar. He has PEG. Cardiology, Nephrology, Vascular surgery, and Pulm were consulted. He is s/p B/L AKA due to frostbite.   06/05/17: Patient seen and examined at his bedside.  He denies any pain in his thumb.  Pain is well controlled on current management.  Denies any chest pain, dyspnea or palpitations.  06/06/2017: Patient seen and examined at his bedside.  He has no new complaints.  He is alert and oriented x3.  Pulmonary is planning on capping trach overnight with continuous O2 monitoring and if no desaturation overnight and no issues with secretion in the morning will consider decannulation.  06/07/17: seen and examined at his bedside. Right stump bleeding. On eliquis for DVT. Will check H&H.  06/08/17: no new complaints. Persistent bleeding from right stump. Vascular surgery informed. Order to stop eliquis. VSS will see today. Highly  appreciated.  06/09/17: Reports itching at his left above the knee amputation. Sarna added. No chest pain, dyspnea or palpitations.  06/10/17: seen and examined w his sister at his bedside. No new complaints. Denies dyspnea or chest pain, or palpitations.  Assessment/Plan: Principal Problem:   Sepsis ([place]) Active Problems:   Lactic acidemia   Acute respiratory failure (HCC)   Exposure to weather condition   Hypotension   Non-ST elevation (NSTEMI) myocardial infarction (HCC)   Malnutrition of moderate degree   Acute cystitis with hematuria   Acute deep vein thrombosis (DVT) of proximal vein of both lower extremities (HCC)   Pressure injury of skin   Cardiogenic shock (HCC)   Acute cor pulmonale (HCC)   Acute respiratory failure with hypoxemia (HCC)   Deep vein thrombosis (DVT) of non-extremity vein   SIRS (systemic inflammatory response syndrome) (HCC)   S/P AKA (above knee amputation) bilateral (HCC)   Hypernatremia   Acute blood loss anemia   Encephalopathy   Postoperative pain   Pulmonary embolus (HCC)   Tracheostomy status (HCC)  Sepsis secondary to Pseudomonas UTI Completed 7 days of IV Zosyn Afebrile in the last 24 hours. Continue to monitor fever curve  Presumed right AKA hematoma Eliquis stopped 06/08/17 per vascular surgery, Dr Biagio Quint, request Vascular surgery following. Highly appreciated Continue wound care Possible wound vac placement planned today  Hyperkalemia K+ 5.3 Asymptomatic Continue to monitor on telemetry Repeat BMP am Continue free water flushes  Leukocytosis, improving Wbc 14k from 20k Possibly reactive from recent surgery  pro-calcitonin less than 0.10 Afebrile  Bilateral lower extremity acute DVT Status post IVC  filter IVC filter placed due to patient's history of noncompliance Currently on heparin drip  Presumed massive pulmonary embolism Echocardiogram done on 05/02/2017 revealed right heart strain with high suspicion for pulmonary  embolism Currently on heparin drip until resume eliquis  Right stump, concern for wound dehiscence Vascular surgery following Stop eliquis as requested by vascular surgery 06/08/17 Wound vac planned 06/10/17  Frostbite gangrene of feet Vascular surgery following Wound care specialist following POD #16 post bilateral above-knee amputations  Acute blood loss anemia/normocytic anemia Status post bilateral AKA on 05/24/2017 Has been transfused 2 units PRBCs on 05/27/2017 Monitor H&H  Acute hypoxic respiratory failure status post trach collar which was removed 06/07/17 Resolved Breathing comfortably on ambient air P CCM followed and signed off on 06/07/2017 Maintain O2 saturation 92% or greater Continue decannulation  AK I on CKD 3 Stable Avoid nephrotoxic agents/hypotension  Hyponatremia Resolved  Dysphagia status post PEG tube placement Improving Patient has been able to tolerate the dysphagia 3 mechanical soft thin liquid diet  NSTEMI Elevated troponin possibly related to pulmonary embolism  Cardiology consulted  Ambulatory dysfunction secondary to bilateral lower extremity amputation Continue PT Awaiting placement at SNF to continue rehab.   Code Status: Full.  Family Communication: None at bedside.  Disposition Plan: SNF when hemodynamically stable and bed available.  Possibly tomorrow if no acute events overnight.   Consultants:  Vascular surgery Orthopedic surgery Speech therapy Wound care specialist  Procedures:  Bilateral above-the-knee amputation  Antimicrobials:  None.  DVT prophylaxis:  SCDs.   Objective: Vitals:   06/09/17 2300 06/10/17 0235 06/10/17 0714 06/10/17 1135  BP: 123/78 (!) 136/59 108/89 (!) 136/94  Pulse: 95 92 95   Resp: 13 (!) 27 (!) 23   Temp: 98.9 F (37.2 C) 98.5 F (36.9 C) 98.7 F (37.1 C) 98.5 F (36.9 C)  TempSrc: Oral Oral Oral Oral  SpO2: 98% 100% 100%   Weight:      Height:        Intake/Output Summary  (Last 24 hours) at 06/10/2017 1547 Last data filed at 06/10/2017 1321 Gross per 24 hour  Intake 503.5 ml  Output 2775 ml  Net -2271.5 ml   Filed Weights   05/31/17 0300 06/04/17 0450  Weight: 84.4 kg (186 lb) 98.9 kg (218 lb)    Exam: 06/10/2017.  Patient seen and examined at his bedside.  Physical exam is essentially unchanged from prior except for what is mentioned below.   General: 67 yo AAM bilateral lower extremities amputee. A&O x 3 NAD.  Cardiovascular: RRR no rubs or gallops  Respiratory: CTA no wheezes or rales.  Abdomen: soft NT ND NBS x4. Peg tube in place  Musculoskeletal: surgical wrap at right stump. Staples in place at left stump.  Skin: As noted above  Psychiatry: Mood is appropriate for condition and setting   Data Reviewed: CBC: Recent Labs  Lab 06/05/17 0331 06/06/17 0530 06/07/17 1129 06/08/17 0826 06/09/17 0208 06/10/17 0816  WBC 22.4* 21.5*  --  17.8* 20.1* 14.7*  HGB 8.9* 9.4* 10.6* 10.2* 10.3* 9.8*  HCT 28.8* 29.8* 32.4* 31.9* 32.0* 30.1*  MCV 90.9 91.4  --  91.1 92.0 91.5  PLT 558* 623*  --  546* 593* 932   Basic Metabolic Panel: Recent Labs  Lab 06/04/17 0812 06/05/17 0331 06/08/17 0826 06/10/17 0816  NA 135 134* 131* 132*  K 4.7 4.7 4.9 5.3*  CL 102 100* 97* 97*  CO2 24 26 25 22   GLUCOSE 153* 144* 141* 145*  BUN 18 23* 27* 28*  CREATININE 0.93 0.97 1.18 1.09  CALCIUM 8.1* 8.4* 8.3* 8.4*   GFR: Estimated Creatinine Clearance: 78.6 mL/min (by C-G formula based on SCr of 1.09 mg/dL). Liver Function Tests: Recent Labs  Lab 06/08/17 0826  AST 33  ALT 70*  ALKPHOS 290*  BILITOT 0.6  PROT 7.2  ALBUMIN 2.4*   No results for input(s): LIPASE, AMYLASE in the last 168 hours. No results for input(s): AMMONIA in the last 168 hours. Coagulation Profile: No results for input(s): INR, PROTIME in the last 168 hours. Cardiac Enzymes: No results for input(s): CKTOTAL, CKMB, CKMBINDEX, TROPONINI in the last 168 hours. BNP (last 3  results) No results for input(s): PROBNP in the last 8760 hours. HbA1C: No results for input(s): HGBA1C in the last 72 hours. CBG: Recent Labs  Lab 06/06/17 0822  GLUCAP 158*   Lipid Profile: No results for input(s): CHOL, HDL, LDLCALC, TRIG, CHOLHDL, LDLDIRECT in the last 72 hours. Thyroid Function Tests: No results for input(s): TSH, T4TOTAL, FREET4, T3FREE, THYROIDAB in the last 72 hours. Anemia Panel: No results for input(s): VITAMINB12, FOLATE, FERRITIN, TIBC, IRON, RETICCTPCT in the last 72 hours. Urine analysis:    Component Value Date/Time   COLORURINE YELLOW 05/25/2017 0654   APPEARANCEUR CLEAR 05/25/2017 0654   LABSPEC 1.013 05/25/2017 0654   PHURINE 6.0 05/25/2017 0654   GLUCOSEU 50 (A) 05/25/2017 0654   HGBUR MODERATE (A) 05/25/2017 0654   BILIRUBINUR NEGATIVE 05/25/2017 0654   KETONESUR NEGATIVE 05/25/2017 0654   PROTEINUR NEGATIVE 05/25/2017 0654   NITRITE POSITIVE (A) 05/25/2017 0654   LEUKOCYTESUR TRACE (A) 05/25/2017 0654   Sepsis Labs: @LABRCNTIP (procalcitonin:4,lacticidven:4)  )No results found for this or any previous visit (from the past 240 hour(s)).    Studies: No results found.  Scheduled Meds: . aspirin  81 mg Per Tube Daily  . chlorhexidine gluconate (MEDLINE KIT)  15 mL Mouth Rinse BID  . collagenase   Topical Daily  . feeding supplement (ENSURE ENLIVE)  237 mL Oral TID BM  . free water  250 mL Per Tube Q8H  . haloperidol  1 mg Oral Daily  . haloperidol  2 mg Oral QHS  . mouth rinse  15 mL Mouth Rinse QID  . Melatonin  6 mg Oral QHS  . multivitamin with minerals  1 tablet Oral Daily  . pantoprazole sodium  40 mg Oral QHS  . polyethylene glycol  17 g Oral Daily  . senna-docusate  1 tablet Oral BID  . thiamine  100 mg Per Tube Daily    Continuous Infusions: . feeding supplement (JEVITY 1.5 CAL/FIBER) Stopped (06/10/17 0933)  . heparin 1,200 Units/hr (06/10/17 1532)  . magnesium sulfate 1 - 4 g bolus IVPB       LOS: 40 days      Kayleen Memos, MD [facility] Pager [phone]  If 7PM-7AM, please contact night-coverage [email or web address] Password [facility] 06/10/2017, 3:47 PM

## 2017-06-10 NOTE — Plan of Care (Signed)
Continue current care plan, wound vac to be place this afternoon by woc rn

## 2017-06-11 LAB — CBC
HEMATOCRIT: 30.6 % — AB (ref 39.0–52.0)
HEMOGLOBIN: 9.8 g/dL — AB (ref 13.0–17.0)
MCH: 28.9 pg (ref 26.0–34.0)
MCHC: 32 g/dL (ref 30.0–36.0)
MCV: 90.3 fL (ref 78.0–100.0)
Platelets: 475 10*3/uL — ABNORMAL HIGH (ref 150–400)
RBC: 3.39 MIL/uL — AB (ref 4.22–5.81)
RDW: 18.1 % — ABNORMAL HIGH (ref 11.5–15.5)
WBC: 16.7 10*3/uL — AB (ref 4.0–10.5)

## 2017-06-11 LAB — APTT
APTT: 65 s — AB (ref 24–36)
aPTT: 74 seconds — ABNORMAL HIGH (ref 24–36)
aPTT: 61 seconds — ABNORMAL HIGH (ref 24–36)

## 2017-06-11 LAB — PROCALCITONIN

## 2017-06-11 LAB — LACTIC ACID, PLASMA: Lactic Acid, Venous: 1.6 mmol/L (ref 0.5–1.9)

## 2017-06-11 LAB — HEPARIN LEVEL (UNFRACTIONATED): Heparin Unfractionated: 0.65 IU/mL (ref 0.30–0.70)

## 2017-06-11 NOTE — Care Management Important Message (Signed)
Important Message  Patient Details  Name: Joseph FloridaLarry E Sonnen MRN: [id number] Date of Birth: July 10, 1950   Medicare Important Message Given:  Yes    Kamrie Fanton P Rohit Deloria 06/11/2017, 1:14 PM

## 2017-06-11 NOTE — Plan of Care (Signed)
Continue current care plan 

## 2017-06-11 NOTE — Progress Notes (Signed)
ANTICOAGULATION CONSULT NOTE  Pharmacy Consult for apixaban > heparin Indication: DVT  No Known Allergies  Patient Measurements: Height: 5\' 10"  (177.8 cm) Weight: 218 lb (98.9 kg) IBW/kg (Calculated) : 73 Heparin Dosing Weight: 75 kg  Vital Signs: Temp: 98.6 F (37 C) (03/26 1146) Temp Source: Oral (03/26 1146) BP: 132/82 (03/26 1146) Pulse Rate: 92 (03/26 1146)  Labs: Recent Labs    06/09/17 0208 06/09/17 1522  06/10/17 0816  06/11/17 0309 06/11/17 1006 06/11/17 1431  HGB 10.3*  --   --  9.8*  --  9.8*  --   --   HCT 32.0*  --   --  30.1*  --  30.6*  --   --   PLT 593*  --   --  292  --  475*  --   --   APTT  --  92*   < > 79*   < > 74* 61* 65*  HEPARINUNFRC  --  1.10*  --  0.94*  --  0.65  --   --   CREATININE  --   --   --  1.09  --   --   --   --    < > = values in this interval not displayed.    Estimated Creatinine Clearance: 78.6 mL/min (by C-G formula based on SCr of 1.09 mg/dL).  Assessment: [age] yoM previously on IV heparin for VTE treatment transitioned to apixaban on 3/16. Pt noted to have R AKA hematoma. Per VVS plan to switch back to IV heparin on 3/24 with no bolus to allow hematoma and wound to heal. Last dose of apixaban at 0930 on 3/24. Given new hematoma, will dose heparin more conservatively.  Heparin level is falsely elevated from recent apixaban dosing, so will continue to monitor aPTT. Last aPTT was slightly up to 65 but the level was drawn only 3 hrs after rate increase. CBC stable. No issues with bleeding documented.  Goal of Therapy:  APTT 66-85 sec Heparin level 0.3-0.5 units/ml Monitor platelets by anticoagulation protocol: Yes   Plan:   Continue heparin gtt at 1,100 units/hr Recheck heparin level in the am Monitor daily heparin level, CBC, s/s of bleed  Enzo BiNathan Rodney Yera, PharmD, [facility] HospitalBCPS Clinical Pharmacist Pager [phone] 06/11/2017 3:48 PM

## 2017-06-11 NOTE — Progress Notes (Addendum)
[facility] [place]  Subjective  - Appears comfortable.  States he is doing OK.   Objective 124/66 94 99.2 F (37.3 C) (Oral) (!) 24 100%  Intake/Output Summary (Last 24 hours) at 06/11/2017 [id number] Last data filed at 06/11/2017 0844 Gross per 24 hour  Intake 1539.26 ml  Output 3325 ml  Net -1785.74 ml   Right stump with functioning wound vac, drainage 50 cc bloody Left stump healing well Lungs non labored breathing   Assessment/Planning: 67 y.o. male is s/p bilaterally above knee amputation  18 Days Post-Op Some Staples removed from right stump on Saturday and hematoma evacuated by Dr. Darrick PennaFields.  Plan for wound vac change tomorrow. WBC continues to rise 16.7 Tm 99.5   Mosetta Pigeonmma Maureen Collins 06/11/2017 9:21 AM --  Laboratory Lab Results: Recent Labs    06/10/17 0816 06/11/17 0309  WBC 14.7* 16.7*  HGB 9.8* 9.8*  HCT 30.1* 30.6*  PLT 292 475*   BMET Recent Labs    06/10/17 0816  NA 132*  K 5.3*  CL 97*  CO2 22  GLUCOSE 145*  BUN 28*  CREATININE 1.09  CALCIUM 8.4*    COAG Lab Results  Component Value Date   INR 1.21 05/10/2017   No results found for: PTT  I have interviewed the patient and examined the patient. I agree with the findings by the PA. Continue VAC.  VAC changed MWF.   Cari Carawayhris Dickson, MD [phone]

## 2017-06-11 NOTE — Progress Notes (Signed)
PROGRESS NOTE  Joseph Downs ZOX:[id number] DOB: [birth year] DOA: 05/01/2017 PCP: Patient, No Pcp Per  HPI/Recap of past 24 hours: Joseph Downs is a 67 yo M, reportedly homeless, no known past medical history other than obstructive uropathy requiring foley. Evidently, on 2/13, he was found down in the cold. He was brought to the ER, hypotensive, had lactate >7 despite 30 cc/kg IVF and re-warming. He was admitted to ICU, at that time was mentating enough to explain that he had passed out, not clear why. That night, he developed respiratory distress requiring intubation. He was treated with broad spectrum antibiotics for septic shock with hypotension, tachycardia and leukocytosis. He had renal failure, creatinine up to 7 and was started on CVVHD. Blood cultures grew Acineto lwoffii, urine cx frew staph lugdunensis. Echo showed severe RV strain and bilateral LE dopplers showed extensive DVT. CTA was not possible, nor catheterization/lysis due to AKI. He was started on heparin drip. [facility] patient has tolerated trach collar. He has PEG. Cardiology, Nephrology, Vascular surgery, and Pulm were consulted. He is s/p B/L AKA due to frostbite.   06/05/17: Patient seen and examined at his bedside.  He denies any pain in his thumb.  Pain is well controlled on current management.  Denies any chest pain, dyspnea or palpitations.  06/06/2017: Patient seen and examined at his bedside.  He has no new complaints.  He is alert and oriented x3.  Pulmonary is planning on capping trach overnight with continuous O2 monitoring and if no desaturation overnight and no issues with secretion in the morning will consider decannulation.  06/07/17: seen and examined at his bedside. Right stump bleeding. On eliquis for DVT. Will check H&H.  06/08/17: no new complaints. Persistent bleeding from right stump. Vascular surgery informed. Order to stop eliquis. VSS will see today. Highly  appreciated.  06/09/17: Reports itching at his left above the knee amputation. Sarna added. No chest pain, dyspnea or palpitations.  06/10/17: seen and examined w his sister at his bedside. No new complaints. Denies dyspnea or chest pain, or palpitations.  06/11/17: No new complaint. Denies pain, dyspnea or nausea. Wound vac in place with 75 cc sanguinous fluid this morning.  Assessment/Plan: Principal Problem:   Sepsis (HCC) Active Problems:   Lactic acidemia   Acute respiratory failure (HCC)   Exposure to weather condition   Hypotension   Non-ST elevation (NSTEMI) myocardial infarction (HCC)   Malnutrition of moderate degree   Acute cystitis with hematuria   Acute deep vein thrombosis (DVT) of proximal vein of both lower extremities (HCC)   Pressure injury of skin   Cardiogenic shock (HCC)   Acute cor pulmonale (HCC)   Acute respiratory failure with hypoxemia (HCC)   Deep vein thrombosis (DVT) of non-extremity vein   SIRS (systemic inflammatory response syndrome) (HCC)   S/P AKA (above knee amputation) bilateral (HCC)   Hypernatremia   Acute blood loss anemia   Encephalopathy   Postoperative pain   Pulmonary embolus (HCC)   Tracheostomy status (HCC)  Sepsis secondary to Pseudomonas UTI Completed 7 days of IV Zosyn Afebrile in the last 24 hours. Continue to monitor fever curve  Presumed right AKA hematoma Eliquis stopped 06/08/17 per vascular surgery, Dr Malachi CarlBitonti, request Vascular surgery following. Highly appreciated Continue wound care Wound vac placed 06/10/17; will be replaced tomorrow.  Hyperkalemia K+ 5.3 Asymptomatic Continue to monitor on telemetry Repeat BMP am Continue free water flushes  Leukocytosis Possibly reactive  from recent surgery pro-calcitonin and lactic acid ordered Afebrile Tmax 99.1 Blood cx no growth to date  Bilateral lower extremity acute DVT Status post IVC filter IVC filter placed due to patient's history of noncompliance Currently  on heparin drip due to hematoma of right stump  Presumed massive pulmonary embolism Echocardiogram done on 05/02/2017 revealed right heart strain with high suspicion for pulmonary embolism Currently on heparin drip until resumes eliquis  Right stump, concern for wound dehiscence Vascular surgery following Stop eliquis as requested by vascular surgery 06/08/17 Wound vac planned 06/10/17  Frostbite gangrene of feet Vascular surgery following Wound care specialist following POD #17 post bilateral above-knee amputations  Acute blood loss anemia/normocytic anemia Status post bilateral AKA on 05/24/2017 Has been transfused 2 units PRBCs on 05/27/2017 Monitor H&H Hemoglobin stable 9.8 from 9.8  Acute hypoxic respiratory failure status post trach collar which was removed 06/07/17 Resolved Breathing comfortably on ambient air P CCM followed and signed off on 06/07/2017 Maintain O2 saturation 92% or greater Continue decannulation  AK I on CKD 3 Stable Avoid nephrotoxic agents/hypotension  Hyponatremia Resolved  Dysphagia status post PEG tube placement Improving Patient has been able to tolerate the dysphagia 3 mechanical soft thin liquid diet  NSTEMI Elevated troponin possibly related to pulmonary embolism  Cardiology consulted  Ambulatory dysfunction secondary to bilateral lower extremity amputation Continue PT Awaiting placement at SNF to continue rehab.   Code Status: Full.  Family Communication: None at bedside. Spoke with the patient's sister on 06/10/17 and updated her on current medical condition.  Disposition Plan: SNF when hemodynamically stable and bed available.  Difficult placement.  Consultants:  Vascular surgery Orthopedic surgery Speech therapy Wound care specialist  Procedures:  Bilateral above-the-knee amputation  Antimicrobials:  None.  DVT prophylaxis:  SCDs.   Objective: Vitals:   06/10/17 2350 06/11/17 0332 06/11/17 0706 06/11/17 1146  BP:  126/76 126/64 124/66 132/82  Pulse: 95 94 94 92  Resp: (!) 21 (!) 21 (!) 24 (!) 21  Temp: 99 F (37.2 C) 98.9 F (37.2 C) 99.2 F (37.3 C) 98.6 F (37 C)  TempSrc: Oral Oral Oral Oral  SpO2: 100% 100% 100% 100%  Weight:      Height:        Intake/Output Summary (Last 24 hours) at 06/11/2017 1534 Last data filed at 06/11/2017 1305 Gross per 24 hour  Intake 1263.76 ml  Output 3450 ml  Net -2186.24 ml   Filed Weights   05/31/17 0300 06/04/17 0450  Weight: 84.4 kg (186 lb) 98.9 kg (218 lb)    Exam: 06/11/2017.  Patient seen and examined at his bedside.  Physical exam is essentially unchanged from prior except for what is mentioned below.   General: 67 yo AAM bilateral lower extremities amputee. A&O x 3 NAD.  Cardiovascular: RRR no rubs or gallops  Respiratory: CTA no wheezes or rales  Abdomen: soft NT ND NBS x4. Peg tube in place  Musculoskeletal: surgical wrap at right stump. Staples in place at left stump.  Skin: As noted above  Psychiatry: Mood is appropriate for condition and setting   Data Reviewed: CBC: Recent Labs  Lab 06/06/17 0530 06/07/17 1129 06/08/17 0826 06/09/17 0208 06/10/17 0816 06/11/17 0309  WBC 21.5*  --  17.8* 20.1* 14.7* 16.7*  HGB 9.4* 10.6* 10.2* 10.3* 9.8* 9.8*  HCT 29.8* 32.4* 31.9* 32.0* 30.1* 30.6*  MCV 91.4  --  91.1 92.0 91.5 90.3  PLT 623*  --  546* 593* 292 475*   Basic  Metabolic Panel: Recent Labs  Lab 06/05/17 0331 06/08/17 0826 06/10/17 0816  NA 134* 131* 132*  K 4.7 4.9 5.3*  CL 100* 97* 97*  CO2 26 25 22   GLUCOSE 144* 141* 145*  BUN 23* 27* 28*  CREATININE 0.97 1.18 1.09  CALCIUM 8.4* 8.3* 8.4*   GFR: Estimated Creatinine Clearance: 78.6 mL/min (by C-G formula based on SCr of 1.09 mg/dL). Liver Function Tests: Recent Labs  Lab 06/08/17 0826  AST 33  ALT 70*  ALKPHOS 290*  BILITOT 0.6  PROT 7.2  ALBUMIN 2.4*   No results for input(s): LIPASE, AMYLASE in the last 168 hours. No results for input(s):  AMMONIA in the last 168 hours. Coagulation Profile: No results for input(s): INR, PROTIME in the last 168 hours. Cardiac Enzymes: No results for input(s): CKTOTAL, CKMB, CKMBINDEX, TROPONINI in the last 168 hours. BNP (last 3 results) No results for input(s): PROBNP in the last 8760 hours. HbA1C: No results for input(s): HGBA1C in the last 72 hours. CBG: Recent Labs  Lab 06/06/17 0822  GLUCAP 158*   Lipid Profile: No results for input(s): CHOL, HDL, LDLCALC, TRIG, CHOLHDL, LDLDIRECT in the last 72 hours. Thyroid Function Tests: No results for input(s): TSH, T4TOTAL, FREET4, T3FREE, THYROIDAB in the last 72 hours. Anemia Panel: No results for input(s): VITAMINB12, FOLATE, FERRITIN, TIBC, IRON, RETICCTPCT in the last 72 hours. Urine analysis:    Component Value Date/Time   COLORURINE YELLOW 05/25/2017 0654   APPEARANCEUR CLEAR 05/25/2017 0654   LABSPEC 1.013 05/25/2017 0654   PHURINE 6.0 05/25/2017 0654   GLUCOSEU 50 (A) 05/25/2017 0654   HGBUR MODERATE (A) 05/25/2017 0654   BILIRUBINUR NEGATIVE 05/25/2017 0654   KETONESUR NEGATIVE 05/25/2017 0654   PROTEINUR NEGATIVE 05/25/2017 0654   NITRITE POSITIVE (A) 05/25/2017 0654   LEUKOCYTESUR TRACE (A) 05/25/2017 0654   Sepsis Labs: @LABRCNTIP (procalcitonin:4,lacticidven:4)  )No results found for this or any previous visit (from the past 240 hour(s)).    Studies: No results found.  Scheduled Meds: . aspirin  81 mg Per Tube Daily  . collagenase   Topical Daily  . feeding supplement (ENSURE ENLIVE)  237 mL Oral TID BM  . free water  250 mL Per Tube Q8H  . haloperidol  1 mg Oral Daily  . haloperidol  2 mg Oral QHS  . Melatonin  6 mg Oral QHS  . multivitamin with minerals  1 tablet Oral Daily  . pantoprazole sodium  40 mg Oral QHS  . polyethylene glycol  17 g Oral Daily  . senna-docusate  1 tablet Oral BID  . thiamine  100 mg Per Tube Daily    Continuous Infusions: . feeding supplement (JEVITY 1.5 CAL/FIBER) Stopped  (06/11/17 1029)  . heparin 1,100 Units/hr (06/11/17 1134)  . magnesium sulfate 1 - 4 g bolus IVPB       LOS: 41 days     Darlin Drop, MD [facility] Pager [phone]  If 7PM-7AM, please contact night-coverage [email or web address] Password [facility] 06/11/2017, 3:34 PM

## 2017-06-11 NOTE — Progress Notes (Signed)
Occupational Therapy Treatment Patient Details Name: Joseph Downs MRN: [id number] DOB: 12-04-1950 Today's Date: 06/11/2017    History of present illness Pt is a 67 year old man who was found down, unresponsive and hypothermic on 05/01/17. Developed respiratory failure and intubated 2/13. Pt also with encephalopathy, shock, NSTEMI, acute renal failure, bilateral DVTs, massive PE, Bil DVTs, massive PE, bactermeia. IVC filter placed 2/22. Extubated 2/26, but re-intubated later that day. Trach placed 2/27. Peg placed 2/28. CT 3/7 negative for acute intracranial abnormality. 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 No medical history on file.   OT comments  Pt making very slow progress with adls. Pt with a sacral wound that makes advancing adls and mobility very difficulty due to pain.  Cognition seems to be mildly improved.    Follow Up Recommendations  SNF;Supervision/Assistance - 24 hour    Equipment Recommendations       Recommendations for Other Services      Precautions / Restrictions Precautions Precautions: Fall Precaution Comments: Bilat AKAs, sacral wound Restrictions Weight Bearing Restrictions: Yes RLE Weight Bearing: Non weight bearing LLE Weight Bearing: Non weight bearing Other Position/Activity Restrictions: New B AKAs       Mobility Bed Mobility Overal bed mobility: Needs Assistance Bed Mobility: Rolling Rolling: Min assist         General bed mobility comments: pt could roll once instructions given.  Placed pt on his side to get off his sacrum but pt moved immediately after not recalling why we discussed staying on his side fully.  Transfers                 General transfer comment: Unable due to sacral pain    Balance Overall balance assessment: Needs assistance Sitting-balance support: Bilateral upper extremity supported Sitting balance-Leahy Scale: Poor Sitting balance - Comments: min to mod assist. Incr assist needed due to pt leaning to avoid pressure  on sacral wound Postural control: Posterior lean                                 ADL either performed or assessed with clinical judgement   ADL Overall ADL's : Needs assistance/impaired Eating/Feeding: Minimal assistance;Bed level   Grooming: Wash/dry face;Wash/dry hands;Oral care;Set up;Bed level   Upper Body Bathing: Moderate assistance;Sitting;Cueing for sequencing Upper Body Bathing Details (indicate cue type and reason): pt sponge bathed and did not need physical assist but required mod cues to sequence. Lower Body Bathing: Total assistance;Bed level                         General ADL Comments: Pt unable to tolerate tasks in long sitting today due to sacral pain.  Pt wanting to be on his side at all times.  Attempted long sitting but pt could not tolerate for more than a few seconds.     Vision       Perception     Praxis      Cognition Arousal/Alertness: Awake/alert Behavior During Therapy: WFL for tasks assessed/performed Overall Cognitive Status: Impaired/Different from baseline Area of Impairment: Attention;Following commands;Safety/judgement;Awareness;Problem solving;Memory                 Orientation Level: Disoriented to;Time;Situation Current Attention Level: Sustained Memory: Decreased short-term memory Following Commands: Follows one step commands consistently Safety/Judgement: Decreased awareness of deficits;Decreased awareness of safety Awareness: Intellectual Problem Solving: Slow processing;Decreased initiation;Difficulty sequencing;Requires verbal cues  Exercises     Shoulder Instructions       General Comments pt remains mildly confused at times with poor attn span.  Limited most by sacral wound.    Pertinent Vitals/ Pain       Pain Assessment: Faces Faces Pain Scale: Hurts even more Pain Location: Sacral wound with repositioning Pain Descriptors / Indicators: Grimacing;Guarding;Sore Pain  Intervention(s): Limited activity within patient's tolerance;Premedicated before session;Repositioned  Home Living                                          Prior Functioning/Environment              Frequency  Min 2X/week        Progress Toward Goals  OT Goals(current goals can now be found in the care plan section)  Progress towards OT goals: Progressing toward goals  Acute Rehab OT Goals Patient Stated Goal: Decreased pain OT Goal Formulation: With patient Time For Goal Achievement: 06/11/17 Potential to Achieve Goals: Fair ADL Goals Pt Will Perform Grooming: with min assist;sitting Pt Will Perform Upper Body Bathing: with min assist;sitting Pt/caregiver will Perform Home Exercise Program: Increased strength;Both right and left upper extremity;With minimal assist Additional ADL Goal #1: Pt will demonstrate fair sitting balance at EOB in preparation for ADL. Additional ADL Goal #2: Pt will perform bed mobility with moderate assistance in preparation for ADL and to assist with pressure relief and bed positioning  Plan Discharge plan remains appropriate    Co-evaluation                 AM-PAC PT "6 Clicks" Daily Activity     Outcome Measure   Help from another person eating meals?: A Little Help from another person taking care of personal grooming?: A Little Help from another person toileting, which includes using toliet, bedpan, or urinal?: Total Help from another person bathing (including washing, rinsing, drying)?: A Lot Help from another person to put on and taking off regular upper body clothing?: A Lot Help from another person to put on and taking off regular lower body clothing?: Total 6 Click Score: 12    End of Session    OT Visit Diagnosis: Pain;Muscle weakness (generalized) (M62.81);Other symptoms and signs involving cognitive function Pain - part of body: Leg   Activity Tolerance Patient limited by pain   Patient Left in  bed;with bed alarm set;with call bell/phone within reach   Nurse Communication Mobility status        Time: [id number] OT Time Calculation (min): 19 min  Charges: OT General Charges $OT Visit: 1 Visit OT Treatments $Self Care/Home Management : 8-22 mins  [facility] Gal Feldhaus,OTR/L [phone]   Hope BuddsJones, Clinton Wahlberg Anne 06/11/2017, 12:24 PM

## 2017-06-11 NOTE — Progress Notes (Addendum)
ANTICOAGULATION CONSULT NOTE  Pharmacy Consult for apixaban > heparin Indication: DVT  No Known Allergies  Patient Measurements: Height: 5\' 10"  (177.8 cm) Weight: 218 lb (98.9 kg) IBW/kg (Calculated) : 73 Heparin Dosing Weight: 75 kg  Vital Signs: Temp: 99.2 F (37.3 C) (03/26 0706) Temp Source: Oral (03/26 0706) BP: 126/64 (03/26 0332) Pulse Rate: 94 (03/26 0332)  Labs: Recent Labs    06/09/17 0208 06/09/17 1522  06/10/17 0816 06/10/17 1413 06/10/17 2031 06/11/17 0309  HGB 10.3*  --   --  9.8*  --   --  9.8*  HCT 32.0*  --   --  30.1*  --   --  30.6*  PLT 593*  --   --  292  --   --  475*  APTT  --  92*   < > 79* 93* 99* 74*  HEPARINUNFRC  --  1.10*  --  0.94*  --   --  0.65  CREATININE  --   --   --  1.09  --   --   --    < > = values in this interval not displayed.    Estimated Creatinine Clearance: 78.6 mL/min (by C-G formula based on SCr of 1.09 mg/dL).  Assessment: [age] yoM previously on IV heparin for VTE treatment transitioned to apixaban on 3/16. Pt noted to have R AKA hematoma. Per VVS plan to switch back to IV heparin on 3/24 with no bolus to allow hematoma and wound to heal. Last dose of apixaban at 0930 on 3/24. Given new hematoma, will dose heparin more conservatively.  aPTT now therapeutic at 79 after rate decrease. Heparin level is falsely elevated from recent apixaban dosing, so will continue to monitor aPTT. CBC stable. No issues with bleeding documented.  Goal of Therapy:  APTT 66-85 sec Heparin level 0.3-0.5 units/ml Monitor platelets by anticoagulation protocol: Yes   Plan:  -Continue heparin at 1050 units/hr -6h aPTT to confirm -Monitor daily heparin level/aPTT/CBC, S/Sx bleeding -F/U transition back to apixaban as appropriate   Babs BertinHaley Marlena Barbato, PharmD, BCPS Clinical Pharmacist Clinical phone for 06/11/2017 until 3:30pm: [phone] If after 3:30pm, please call main pharmacy at: [phone] 06/11/2017 8:29 AM  ADDENDUM:  Confirmatory aPTT now  slightly low at 61. No bleed or issues with the infusion per RN.  Plan: -Increase heparin slightly to 1100 units/hr -6h aPTT -Monitor daily heparin level/aPTT/CBC, S/Sx bleeding -F/U transition back to apixaban as appropriate  Babs BertinHaley Adith Tejada, PharmD, BCPS Clinical Pharmacist 06/11/2017 11:02 AM

## 2017-06-11 NOTE — Progress Notes (Signed)
  Speech Language Pathology Treatment: Dysphagia  Patient Details Name: Joseph Downs MRN: [id number] DOB: 1950/09/23 Today's Date: 06/11/2017 Time: [id number] SLP Time Calculation (min) (ACUTE ONLY): 17 min  Assessment / Plan / Recommendation Clinical Impression  Pt now decannulated with closed stoma without air leak. Facilitated proper and safe positioning. Pt hyperphagic and given moderate verbal cues to slow rate with pt response "I always eat fast." No s/s aspiration noted. Will continue Dys 3 due to increased pace and continue ST decreasing frequency to 1 time a week due to progress achieved.    HPI HPI: 67 year old homeless man found down/13 with hypothermia, shock lactic acidosis. 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 HehadAcinetobacter in blood and staph in urine with elevated PSA. Unable to be weaned from vent (ETT 2/13-2/27), trach placed 2/27, PEG 2/28. FEES recommended Dys 1, honey thick liquids. MBS today for possible liquid upgrade.      SLP Plan  Continue with current plan of care       Recommendations  Diet recommendations: Dysphagia 3 (mechanical soft);Thin liquid Liquids provided via: Cup;Straw Medication Administration: Whole meds with puree Supervision: Patient able to self feed;Full supervision/cueing for compensatory strategies Compensations: Slow rate;Small sips/bites Postural Changes and/or Swallow Maneuvers: Seated upright 90 degrees                Oral Care Recommendations: Oral care BID Follow up Recommendations: Skilled Nursing facility SLP Visit Diagnosis: Dysphagia, oropharyngeal phase (R13.12) Plan: Continue with current plan of care       GO                [name], Burley Kopka Willis 06/11/2017, 8:52 AM   Breck CoonsLisa Willis Lonell FaceLitaker M.Ed [employer]-SLP Pager [phone]

## 2017-06-11 NOTE — Care Management Note (Signed)
Case Management Note  Patient Details  Name: Joseph Downs MRN: [id number] Date of Birth: Mar 11, 1951  Subjective/Objective:    Pt admitted with Hypothermia shock, and bilateral DVTs                Action/Plan:  PTA from home and was found down.  Pt remains on ventilator via ETT tube.  Pt originally on CRRT - now discontinued and daily IHD assessments.  CM will continue to follow for discharge needs   Expected Discharge Date:  05/27/17               Expected Discharge Plan:     In-House Referral:     Discharge planning Services  CM Consult  Post Acute Care Choice:    Choice offered to:     DME Arranged:    DME Agency:     HH Arranged:    HH Agency:     Status of Service:     If discussed at [employer] Meetings, dates discussed:    Additional Comments: 06/11/2017  CSW continue to follow for discharge.  Discharge barrier to SNF continues to be heparin drip ` 05/31/17 Discussed in LOS 3/13.  Barrier to discharge; pt remains on IV heparin drip.  CM discussed barrier with pharm - pt remains on IV due to unstable hgb, additionally pt continues to ooze from AKA site.  CSW continues to follow for SNF placement when approrpriate   05/30/2017 Pt remains on TC 28%.  Having fevers and increased WBC count.  Pt remains on IV heparin, tube feeds and IV antibiotics.  CSW continuing to follow for placement when appropriate  05/24/17 Pt is now s/p bilateral BKA.  CSW continuing to follow for discharge to SNF when appropriate [name], [name], RN 06/11/2017, 11:11 AM

## 2017-06-12 ENCOUNTER — Encounter: Payer: Medicare Other | Admitting: Vascular Surgery

## 2017-06-12 LAB — APTT
APTT: 69 s — AB (ref 24–36)
aPTT: 48 seconds — ABNORMAL HIGH (ref 24–36)

## 2017-06-12 LAB — HEPARIN LEVEL (UNFRACTIONATED): HEPARIN UNFRACTIONATED: 0.33 [IU]/mL (ref 0.30–0.70)

## 2017-06-12 LAB — CBC
HEMATOCRIT: 29.7 % — AB (ref 39.0–52.0)
Hemoglobin: 9.7 g/dL — ABNORMAL LOW (ref 13.0–17.0)
MCH: 29.9 pg (ref 26.0–34.0)
MCHC: 32.7 g/dL (ref 30.0–36.0)
MCV: 91.7 fL (ref 78.0–100.0)
PLATELETS: 415 10*3/uL — AB (ref 150–400)
RBC: 3.24 MIL/uL — ABNORMAL LOW (ref 4.22–5.81)
RDW: 18.1 % — ABNORMAL HIGH (ref 11.5–15.5)
WBC: 15.4 10*3/uL — AB (ref 4.0–10.5)

## 2017-06-12 MED ORDER — JEVITY 1.5 CAL/FIBER PO LIQD
237.0000 mL | Freq: Three times a day (TID) | ORAL | Status: DC
  Administered 2017-06-12 – 2017-06-13 (×2): 237 mL
  Filled 2017-06-12 (×7): qty 1000

## 2017-06-12 NOTE — Progress Notes (Signed)
Patient ID: Joseph Downs, male   DOB: 11-20-1950, 67 y.o.   MRN: [id number]  PROGRESS NOTE    Joseph Downs  WUJ:[id number] DOB: May 19, 1950 DOA: 05/01/2017 PCP: Patient, No Pcp Per   Brief Narrative:  67 year old male, reportedly homeless with no past medical history other than obstructive uropathy requiring Foley was found down in the cold on 05/01/2017.  He was brought to the ER, hypotensive with elevated lactate.  He was admitted to ICU, developed respiratory distress and required intubation.  He was treated with broad-spectrum antibiotics for septic shock.  He also developed renal failure with creatinine going up to 7 for which she required CVVH.  He has had an extensive hospitalization with complicated course.  He was found to have extensive DVT on bilateral lower extremity Dopplers and echo showed severe RV strain.  CTA could not be done because of renal failure.  He was started on heparin drip.  He eventually had trach and PEG.  He was transferred out of ICU.  He is status post bilateral AKA.  Patient was transferred to [facility] service.  He was switched to Eliquis but then transitioned back to heparin because of stump hematoma as per vascular surgery recommendations.  Assessment & Plan:   Principal Problem:   Sepsis (HCC) Active Problems:   Lactic acidemia   Acute respiratory failure (HCC)   Exposure to weather condition   Hypotension   Non-ST elevation (NSTEMI) myocardial infarction (HCC)   Malnutrition of moderate degree   Acute cystitis with hematuria   Acute deep vein thrombosis (DVT) of proximal vein of both lower extremities (HCC)   Pressure injury of skin   Cardiogenic shock (HCC)   Acute cor pulmonale (HCC)   Acute respiratory failure with hypoxemia (HCC)   Deep vein thrombosis (DVT) of non-extremity vein   SIRS (systemic inflammatory response syndrome) (HCC)   S/P AKA (above knee amputation) bilateral (HCC)   Hypernatremia   Acute blood loss anemia   Encephalopathy  Postoperative pain   Pulmonary embolus (HCC)   Tracheostomy status (HCC)   Septic shock -Resolved.  -Patient has completed antibiotic therapy.  Currently afebrile and hemodynamically stable -Initial urine culture had grown staph lugdunensis.  Initial blood cultures had grown acinetobacter lwoffi  Pseudomonas UTI -Completed 7 days of intravenous Zosyn till 06/02/2017   Presumed right AKA stump hematoma with concern for wound dehiscence Eliquis stopped 06/08/17 per vascular surgery Vascular surgery following.  Continue wound care and wound VAC per vascular surgery recommendations  Hyperkalemia -No repeat potassium level today.  Repeat a.m. potassium  Leukocytosis -Possibly reactive from recent surgery -Cultures negative -Afebrile  Bilateral lower extremity acute DVT -Status post IVC filter; placed due to patient's history of noncompliance -Currently on heparin drip due to hematoma of right stump  Presumed massive pulmonary embolism -Echocardiogram done on 05/02/2017 revealed right heart strain with high suspicion for pulmonary embolism -Currently on heparin drip until resumes eliquis  Frostbite gangrene of feet -Vascular surgery following -Wound care specialist following -Status post bilateral above-knee amputations  Acute blood loss anemia/normocytic anemia -Has been transfused 2 units PRBCs on 05/27/2017 -Hemoglobin stable.  No sign of active bleeding currently  Acute hypoxic respiratory failure status post trach collar which was removed 06/07/17 -Resolved -Breathing comfortably on room air -PCCM followed and signed off on 06/07/2017   AKI on CKD 3 -Creatinine on 06/10/2017 was stable.  Repeat a.m. labs  Hyponatremia Resolved  Dysphagia status post PEG tube placement -Improving -Patient has been able to tolerate  the dysphagia 3 mechanical soft thin liquid diet as per SLP recommendations.  Continue PEG tube feeding as well  NSTEMI -Elevated troponin  possibly related to pulmonary embolism  -Cardiology has signed off.  Continue aspirin  Ambulatory dysfunction  -Continue PT -Awaiting placement at SNF to continue rehab.   DVT prophylaxis: Heparin drip Code Status: Full Family Communication: None at bedside Disposition Plan: Nursing home once clinically stable  Consultants: Vascular surgery/orthopedic surgery/PCCM/cardiology  Procedures: Bilateral AKA on 05/24/2017 IVC filter placement Intubation and extubation PEG tube placement Tracheostomy placement and removal  Antimicrobials:  Anti-infectives (From admission, onward)   Start     Dose/Rate Route Frequency Ordered Stop   05/27/17 1400  piperacillin-tazobactam (ZOSYN) IVPB 3.375 g     3.375 g 12.5 mL/hr over 240 Minutes Intravenous Every 8 hours 05/27/17 0724 06/03/17 1359   05/27/17 0730  piperacillin-tazobactam (ZOSYN) IVPB 3.375 g     3.375 g 100 mL/hr over 30 Minutes Intravenous  Once 05/27/17 0724 05/27/17 0908   05/25/17 1300  cefTRIAXone (ROCEPHIN) 1 g in sodium chloride 0.9 % 100 mL IVPB  Status:  Discontinued     1 g 200 mL/hr over 30 Minutes Intravenous Every 24 hours 05/25/17 1200 05/27/17 0706   05/24/17 1800  ceFAZolin (ANCEF) IVPB 2g/100 mL premix     2 g 200 mL/hr over 30 Minutes Intravenous Every 8 hours 05/24/17 1300 05/25/17 0238   05/07/17 2200  meropenem (MERREM) 500 mg in sodium chloride 0.9 % 100 mL IVPB     500 mg 200 mL/hr over 30 Minutes Intravenous Every 24 hours 05/07/17 1306 05/19/17 1911   05/07/17 0930  meropenem (MERREM) 1 g in sodium chloride 0.9 % 100 mL IVPB  Status:  Discontinued     1 g 200 mL/hr over 30 Minutes Intravenous Every 12 hours 05/07/17 0908 05/07/17 1306   05/06/17 1300  meropenem (MERREM) 500 mg in sodium chloride 0.9 % 100 mL IVPB  Status:  Discontinued     500 mg 200 mL/hr over 30 Minutes Intravenous Every 24 hours 05/06/17 1146 05/07/17 0908   05/04/17 2200  vancomycin (VANCOCIN) IVPB 1000 mg/200 mL premix     1,000  mg 200 mL/hr over 60 Minutes Intravenous Every 24 hours 05/04/17 0933 05/07/17 2339   05/04/17 1400  piperacillin-tazobactam (ZOSYN) IVPB 3.375 g  Status:  Discontinued     3.375 g 100 mL/hr over 30 Minutes Intravenous Every 6 hours 05/04/17 0932 05/06/17 1146   05/03/17 1300  vancomycin (VANCOCIN) IVPB 1000 mg/200 mL premix  Status:  Discontinued     1,000 mg 200 mL/hr over 60 Minutes Intravenous Every 48 hours 05/02/17 1340 05/04/17 0933   05/02/17 2000  piperacillin-tazobactam (ZOSYN) IVPB 2.25 g  Status:  Discontinued     2.25 g 100 mL/hr over 30 Minutes Intravenous Every 6 hours 05/02/17 1342 05/04/17 0932   05/02/17 1300  vancomycin (VANCOCIN) IVPB 1000 mg/200 mL premix  Status:  Discontinued     1,000 mg 200 mL/hr over 60 Minutes Intravenous Every 24 hours 05/01/17 1313 05/02/17 1340   05/01/17 2000  piperacillin-tazobactam (ZOSYN) IVPB 3.375 g  Status:  Discontinued     3.375 g 12.5 mL/hr over 240 Minutes Intravenous Every 8 hours 05/01/17 1313 05/02/17 1342   05/01/17 1200  vancomycin (VANCOCIN) 1,750 mg in sodium chloride 0.9 % 500 mL IVPB     1,750 mg 250 mL/hr over 120 Minutes Intravenous  Once 05/01/17 1121 05/01/17 1409   05/01/17 1130  piperacillin-tazobactam (ZOSYN)  IVPB 3.375 g     3.375 g 100 mL/hr over 30 Minutes Intravenous  Once 05/01/17 1119 05/01/17 1217   05/01/17 1130  vancomycin (VANCOCIN) IVPB 1000 mg/200 mL premix  Status:  Discontinued     1,000 mg 200 mL/hr over 60 Minutes Intravenous  Once 05/01/17 1119 05/01/17 1121        Subjective: Patient seen and examined at bedside.  He is awake and answers questions.  No overnight fever, nausea or vomiting.  Objective: Vitals:   06/11/17 2341 06/12/17 0313 06/12/17 0803 06/12/17 1151  BP: (!) 145/93 124/70 139/86 138/85  Pulse: 100 94    Resp: (!) 29 12 19  (!) 34  Temp: 98.5 F (36.9 C) (!) 97.4 F (36.3 C) 99.2 F (37.3 C) 98.5 F (36.9 C)  TempSrc: Oral Oral Oral Oral  SpO2: 100% 98%    Weight:       Height:        Intake/Output Summary (Last 24 hours) at 06/12/2017 1422 Last data filed at 06/12/2017 1151 Gross per 24 hour  Intake 646.17 ml  Output 2875 ml  Net -2228.83 ml   Filed Weights   05/31/17 0300 06/04/17 0450  Weight: 84.4 kg (186 lb) 98.9 kg (218 lb)    Examination:  General exam: Appears calm and comfortable.  No distress.  Looks older than stated age Respiratory system: Bilateral decreased breath sound at bases Cardiovascular system: S1 & S2 heard, rate controlled  gastrointestinal system: Abdomen is nondistended, soft and nontender. Normal bowel sounds heard.  PEG tube present Extremities: Bilateral AKA.  Right stump wrapped.   Data Reviewed: I have personally reviewed following labs and imaging studies  CBC: Recent Labs  Lab 06/08/17 0826 06/09/17 0208 06/10/17 0816 06/11/17 0309 06/12/17 0238  WBC 17.8* 20.1* 14.7* 16.7* 15.4*  HGB 10.2* 10.3* 9.8* 9.8* 9.7*  HCT 31.9* 32.0* 30.1* 30.6* 29.7*  MCV 91.1 92.0 91.5 90.3 91.7  PLT 546* 593* 292 475* 415*   Basic Metabolic Panel: Recent Labs  Lab 06/08/17 0826 06/10/17 0816  NA 131* 132*  K 4.9 5.3*  CL 97* 97*  CO2 25 22  GLUCOSE 141* 145*  BUN 27* 28*  CREATININE 1.18 1.09  CALCIUM 8.3* 8.4*   GFR: Estimated Creatinine Clearance: 78.6 mL/min (by C-G formula based on SCr of 1.09 mg/dL). Liver Function Tests: Recent Labs  Lab 06/08/17 0826  AST 33  ALT 70*  ALKPHOS 290*  BILITOT 0.6  PROT 7.2  ALBUMIN 2.4*   No results for input(s): LIPASE, AMYLASE in the last 168 hours. No results for input(s): AMMONIA in the last 168 hours. Coagulation Profile: No results for input(s): INR, PROTIME in the last 168 hours. Cardiac Enzymes: No results for input(s): CKTOTAL, CKMB, CKMBINDEX, TROPONINI in the last 168 hours. BNP (last 3 results) No results for input(s): PROBNP in the last 8760 hours. HbA1C: No results for input(s): HGBA1C in the last 72 hours. CBG: Recent Labs  Lab  06/06/17 0822  GLUCAP 158*   Lipid Profile: No results for input(s): CHOL, HDL, LDLCALC, TRIG, CHOLHDL, LDLDIRECT in the last 72 hours. Thyroid Function Tests: No results for input(s): TSH, T4TOTAL, FREET4, T3FREE, THYROIDAB in the last 72 hours. Anemia Panel: No results for input(s): VITAMINB12, FOLATE, FERRITIN, TIBC, IRON, RETICCTPCT in the last 72 hours. Sepsis Labs: Recent Labs  Lab 06/11/17 1428 06/11/17 1431  PROCALCITON  --  <0.10  LATICACIDVEN 1.6  --     No results found for this or any previous  visit (from the past 240 hour(s)).       Radiology Studies: No results found.      Scheduled Meds: . aspirin  81 mg Per Tube Daily  . collagenase   Topical Daily  . feeding supplement (ENSURE ENLIVE)  237 mL Oral TID BM  . feeding supplement (JEVITY 1.5 CAL/FIBER)  237 mL Per Tube TID WC  . free water  250 mL Per Tube Q8H  . haloperidol  1 mg Oral Daily  . haloperidol  2 mg Oral QHS  . Melatonin  6 mg Oral QHS  . multivitamin with minerals  1 tablet Oral Daily  . pantoprazole sodium  40 mg Oral QHS  . polyethylene glycol  17 g Oral Daily  . senna-docusate  1 tablet Oral BID  . thiamine  100 mg Per Tube Daily   Continuous Infusions: . heparin 1,250 Units/hr (06/12/17 0846)  . magnesium sulfate 1 - 4 g bolus IVPB       LOS: 42 days        Glade LloydKshitiz Dayvian Blixt, MD [facility] Pager [phone]  If 7PM-7AM, please contact night-coverage [email or web address] Password [email or web address] 06/12/2017, 2:22 PM

## 2017-06-12 NOTE — Progress Notes (Signed)
Clinical Social Worker following patient for support and discharge needs. CSW was able to attain a bed offer for patient. [facility] patient into there facility once patient is medically cleared. CSW spoke with both patient and patients sister to make them aware of bed offer. Patient stated he is agreeable to meet with admissions coordinator at facility to do paperwork in the morning since his sister wont be available. CSW also confirmed [place] behalf of the patient.   Marrianne MoodAshley Hy Swiatek, MSW,  [employer]CSWA [phone]

## 2017-06-12 NOTE — Progress Notes (Signed)
  Progress Note    06/12/2017 8:32 AM 19 Days Post-Op  Subjective:  No complaints  Tm 99.2 this am  Vitals:   06/12/17 0313 06/12/17 0803  BP: 124/70 139/86  Pulse: 94   Resp: 12 19  Temp: (!) 97.4 F (36.3 C) 99.2 F (37.3 C)  SpO2: 98%     Physical Exam: Incisions:  Wound vac right stump; left stump incision is clean with staples in tact.   CBC    Component Value Date/Time   WBC 15.4 (H) 06/12/2017 0238   RBC 3.24 (L) 06/12/2017 0238   HGB 9.7 (L) 06/12/2017 0238   HCT 29.7 (L) 06/12/2017 0238   PLT 415 (H) 06/12/2017 0238   MCV 91.7 06/12/2017 0238   MCH 29.9 06/12/2017 0238   MCHC 32.7 06/12/2017 0238   RDW 18.1 (H) 06/12/2017 0238   LYMPHSABS 1.7 05/17/2017 2300   MONOABS 0.3 05/17/2017 2300   EOSABS 1.1 (H) 05/17/2017 2300   BASOSABS 0.0 05/17/2017 2300    BMET    Component Value Date/Time   NA 132 (L) 06/10/2017 0816   K 5.3 (H) 06/10/2017 0816   CL 97 (L) 06/10/2017 0816   CO2 22 06/10/2017 0816   GLUCOSE 145 (H) 06/10/2017 0816   BUN 28 (H) 06/10/2017 0816   CREATININE 1.09 06/10/2017 0816   CALCIUM 8.4 (L) 06/10/2017 0816   GFRNONAA >60 06/10/2017 0816   GFRAA >60 06/10/2017 0816    INR    Component Value Date/Time   INR 1.21 05/10/2017 0650     Intake/Output Summary (Last 24 hours) at 06/12/2017 0832 Last data filed at 06/12/2017 0802 Gross per 24 hour  Intake 980.67 ml  Output 2900 ml  Net -1919.33 ml     Assessment/Plan:  67 y.o. male is s/p bilaterally above knee amputations  19 Days Post-Op  -pt for wound vac change today -white count down some today -continue vac M/W/F   Doreatha MassedSamantha Rhyne, PA-C [facility] [phone] 06/12/2017 8:32 AM

## 2017-06-12 NOTE — Progress Notes (Signed)
ANTICOAGULATION CONSULT NOTE  Pharmacy Consult for apixaban > heparin Indication: DVT  No Known Allergies  Patient Measurements: Height: 5\' 10"  (177.8 cm) Weight: 218 lb (98.9 kg) IBW/kg (Calculated) : 73 Heparin Dosing Weight: 75 kg  Vital Signs: Temp: 99.2 F (37.3 C) (03/27 0803) Temp Source: Oral (03/27 0803) BP: 139/86 (03/27 0803) Pulse Rate: 94 (03/27 0313)  Labs: Recent Labs    06/10/17 0816  06/11/17 0309 06/11/17 1006 06/11/17 1431 06/12/17 0238  HGB 9.8*  --  9.8*  --   --  9.7*  HCT 30.1*  --  30.6*  --   --  29.7*  PLT 292  --  475*  --   --  415*  APTT 79*   < > 74* 61* 65* 48*  HEPARINUNFRC 0.94*  --  0.65  --   --  0.33  CREATININE 1.09  --   --   --   --   --    < > = values in this interval not displayed.    Estimated Creatinine Clearance: 78.6 mL/min (by C-G formula based on SCr of 1.09 mg/dL).  Assessment: [age] yoM previously on IV heparin for VTE treatment transitioned to apixaban on 3/16. Pt noted to have R AKA hematoma. Per VVS plan to switch back to IV heparin on 3/24 with no bolus to allow hematoma and wound to heal. Last dose of apixaban at 0930 on 3/24. Given new hematoma, will dose heparin more conservatively.  aPTT now trending down, subtherapeutic at 48. Heparin level is falsely elevated from recent apixaban dosing, so will continue to monitor aPTT. CBC stable. No issues with bleeding or the infusion per discussion with RN.  Goal of Therapy:  APTT 66-85 sec Heparin level 0.3-0.5 units/ml Monitor platelets by anticoagulation protocol: Yes   Plan:  -Increase heparin to 1250 units/hr -6h aPTT -Monitor daily heparin level/aPTT/CBC, S/Sx bleeding -F/U transition back to apixaban as appropriate   Babs BertinHaley Nahom Downs, PharmD, BCPS Clinical Pharmacist Clinical phone for 06/12/2017 until 3:30pm: [phone] If after 3:30pm, please call main pharmacy at: [phone] 06/12/2017 8:33 AM

## 2017-06-12 NOTE — Progress Notes (Signed)
Nutrition Follow-up  DOCUMENTATION CODES:   Non-severe (moderate) malnutrition in context of social or environmental circumstances  INTERVENTION:   -Continue MVI daily  -Discontinue nocturnal feedings of Jevity 1.5 @ 55 ml/hr x 12 hours  -Ensure Enlive po TID, each supplement provides 350 kcal and 20 grams of protein  -Transition to bolus feeding regimen. Bolus 237 ml Jevity 1.5 TID, if pt consumes less than 50% of meal. Each administration will provides 355 kcals, 15 grams protein, and 180 ml free water (Total regimen provides 1065 kcals, 45 grams protein, and 540 ml free water daily.  NUTRITION DIAGNOSIS:   Moderate Malnutrition related to social / environmental circumstances as evidenced by mild fat depletion, mild muscle depletion.  Ongoing  GOAL:   Patient will meet greater than or equal to 90% of their needs  Progressing  MONITOR:   Diet advancement, Labs, Weight trends, Skin, I & O's, TF tolerance  REASON FOR ASSESSMENT:   Consult Assessment of nutrition requirement/status, Calorie Count  ASSESSMENT:   67 yo male with no PMH who was admitted on 2/13 after being found unresponsive outside by police with hypothermia, hypotension, elevated lactic acid. Required intubation on admission. Found to have extensive bilateral DVTs.  11/27- s/p trach 2/28- s/p PEG placement 3/6- transferred from ICU to SDU 3/8- s/p bilateral AKA 3/11- s/p FEES- advanced to a dysphagia 1 diet with honey thick liquids 3/17- pulled out trach; replaced 3/18- s/p BSE- advanced to dysphagia 3 diet with nectar thick liquids 3/19- s/p FEES- advanced to dysphagia 3 diet with thin liquids 3/21- trach downsized from #6 to #4 cuffless shiley 3/23- decannulated  Reviewed I/O's: -2.3 L x 24 hours and -9.5 L since 05/29/17  Pt remains onnocturnal feedings:Jevity 1.5@55ml /hr via PEGover 12 hour period, which provides 890kcal (45% of needs),27grams of protein, and51002ml of H2O.(Total free  water: 1252ml).   Pt just received dressing change at time of RD visit.   Case discussed with RN, who reports that pt's intake continues to be variable (PO: 25-75%). He consumed [place] toast and a few bites of eggs when fed by nurse tech this AM. He is drinking Ensure, however, usually consumes less than half of supplement. Reviewed meal intake; pt consuming about 1153 kcals and 59 grams protein (including PO's and Ensure supplements), meeting about 58% of estimated kcal needs and 54% of estimated protein needs. With inclusion of nocturnal feedings, pt meeting 100% of estimated kcal needs and 78% of estimated protein needs.   Labs reviewed.   Diet Order:  DIET DYS 3 Room service appropriate? Yes; Fluid consistency: Thin Fall precautions  EDUCATION NEEDS:   No education needs have been identified at this time  Skin:  Skin Assessment: Skin Integrity Issues: Skin Integrity Issues:: Stage II, Unstageable Stage II: worsening- advanced to unstageable Unstageable: sacrum Other: bilateral AKA incisions  Last BM:  06/11/17  Height:   Ht Readings from Last 1 Encounters:  05/14/17 5\' 10"  (1.778 m)    Weight:   Wt Readings from Last 1 Encounters:  06/04/17 218 lb (98.9 kg)    Ideal Body Weight:  63.4 kg  BMI:  Body mass index is 31.28 kg/m.  Estimated Nutritional Needs:   Kcal:  2000-2200  Protein:  110-125 grams  Fluid:  >2.0 L    Lawrnce Reyez A. Mayford KnifeWilliams, RD, LDN, CDE Pager: [phone] After hours Pager: [phone]

## 2017-06-12 NOTE — Progress Notes (Signed)
ANTICOAGULATION CONSULT NOTE  Pharmacy Consult for apixaban > heparin Indication: DVT  No Known Allergies  Patient Measurements: Height: 5\' 10"  (177.8 cm) Weight: 218 lb (98.9 kg) IBW/kg (Calculated) : 73 Heparin Dosing Weight: 75 kg  Vital Signs: Temp: 98.4 F (36.9 C) (03/27 1641) Temp Source: Oral (03/27 1641) BP: 174/117 (03/27 1641)  Labs: Recent Labs    06/10/17 0816  06/11/17 0309  06/11/17 1431 06/12/17 0238 06/12/17 1602  HGB 9.8*  --  9.8*  --   --  9.7*  --   HCT 30.1*  --  30.6*  --   --  29.7*  --   PLT 292  --  475*  --   --  415*  --   APTT 79*   < > 74*   < > 65* 48* 69*  HEPARINUNFRC 0.94*  --  0.65  --   --  0.33  --   CREATININE 1.09  --   --   --   --   --   --    < > = values in this interval not displayed.    Estimated Creatinine Clearance: 78.6 mL/min (by C-G formula based on SCr of 1.09 mg/dL).  Assessment: [age] yoM previously on IV heparin for VTE treatment transitioned to apixaban on 3/16. Pt noted to have R AKA hematoma. Per VVS plan to switch back to IV heparin on 3/24 with no bolus to allow hematoma and wound to heal. Last dose of apixaban at 0930 on 3/24. Given new hematoma, will dose heparin more conservatively.  aPTT now trending down, subtherapeutic at 48. Heparin level is falsely elevated from recent apixaban dosing, so will continue to monitor aPTT. CBC stable. No issues with bleeding or the infusion per discussion with RN.  PTT this evening now back within goal range.  Goal of Therapy:  APTT 66-85 sec Heparin level 0.3-0.5 units/ml Monitor platelets by anticoagulation protocol: Yes   Plan:  Continue IV heparin at 1250 units/hr -Monitor daily heparin level/aPTT/CBC, S/Sx bleeding -F/U transition back to apixaban as appropriate   Tad MooreJessica Zayaan Kozak, Pharm D, BCPS  Clinical Pharmacist Pager [phone]  06/12/2017 5:31 PM

## 2017-06-13 LAB — BASIC METABOLIC PANEL
ANION GAP: 12 (ref 5–15)
BUN: 25 mg/dL — AB (ref 6–20)
CALCIUM: 9.3 mg/dL (ref 8.9–10.3)
CO2: 27 mmol/L (ref 22–32)
Chloride: 95 mmol/L — ABNORMAL LOW (ref 101–111)
Creatinine, Ser: 1.08 mg/dL (ref 0.61–1.24)
GFR calc Af Amer: >60 mL/min (ref >60)
GLUCOSE: 125 mg/dL — AB (ref 65–99)
Potassium: 5.2 mmol/L — ABNORMAL HIGH (ref 3.5–5.1)
SODIUM: 134 mmol/L — AB (ref 135–145)

## 2017-06-13 LAB — CBC
HCT: 32.8 % — ABNORMAL LOW (ref 39.0–52.0)
HEMOGLOBIN: 10.5 g/dL — AB (ref 13.0–17.0)
MCH: 29.2 pg (ref 26.0–34.0)
MCHC: 32 g/dL (ref 30.0–36.0)
MCV: 91.4 fL (ref 78.0–100.0)
Platelets: 407 10*3/uL — ABNORMAL HIGH (ref 150–400)
RBC: 3.59 MIL/uL — ABNORMAL LOW (ref 4.22–5.81)
RDW: 18.1 % — AB (ref 11.5–15.5)
WBC: 17 10*3/uL — AB (ref 4.0–10.5)

## 2017-06-13 LAB — APTT: APTT: 61 s — AB (ref 24–36)

## 2017-06-13 LAB — HEPARIN LEVEL (UNFRACTIONATED): Heparin Unfractionated: 0.6 IU/mL (ref 0.30–0.70)

## 2017-06-13 LAB — MAGNESIUM: MAGNESIUM: 2 mg/dL (ref 1.7–2.4)

## 2017-06-13 MED ORDER — COLLAGENASE 250 UNIT/GM EX OINT: TOPICAL_OINTMENT | Freq: Every day | CUTANEOUS | 0 refills | Status: DC

## 2017-06-13 MED ORDER — THIAMINE HCL 100 MG PO TABS: 100.0000 mg | ORAL_TABLET | Freq: Every day | ORAL | 0 refills | Status: DC

## 2017-06-13 MED ORDER — APIXABAN 5 MG PO TABS
5.0000 mg | ORAL_TABLET | Freq: Two times a day (BID) | ORAL | Status: DC
  Administered 2017-06-13: 5 mg via ORAL
  Filled 2017-06-13: qty 1

## 2017-06-13 MED ORDER — APIXABAN 5 MG PO TABS: 5.0000 mg | ORAL_TABLET | Freq: Two times a day (BID) | ORAL | 0 refills | Status: AC

## 2017-06-13 MED ORDER — MELATONIN 3 MG PO TABS: 6.0000 mg | ORAL_TABLET | Freq: Every day | ORAL | 0 refills | Status: AC

## 2017-06-13 MED ORDER — CAMPHOR-MENTHOL 0.5-0.5 % EX LOTN: TOPICAL_LOTION | CUTANEOUS | 0 refills | Status: DC | PRN

## 2017-06-13 MED ORDER — ENSURE ENLIVE PO LIQD: 237.0000 mL | Freq: Three times a day (TID) | ORAL | 12 refills | Status: DC

## 2017-06-13 MED ORDER — FREE WATER: 250.0000 mL | Freq: Three times a day (TID) | 5 refills | Status: DC

## 2017-06-13 MED ORDER — OXYCODONE HCL 5 MG PO TABS: 5.0000 mg | ORAL_TABLET | Freq: Four times a day (QID) | ORAL | 0 refills | Status: DC | PRN

## 2017-06-13 MED ORDER — HALOPERIDOL 1 MG PO TABS: ORAL_TABLET | ORAL | 0 refills | Status: DC

## 2017-06-13 MED ORDER — ASPIRIN 81 MG PO CHEW: 81.0000 mg | CHEWABLE_TABLET | Freq: Every day | ORAL | 0 refills | Status: AC

## 2017-06-13 MED ORDER — ADULT MULTIVITAMIN W/MINERALS CH: 1.0000 | ORAL_TABLET | Freq: Every day | ORAL | 0 refills | Status: AC

## 2017-06-13 MED ORDER — JEVITY 1.5 CAL/FIBER PO LIQD: 237.0000 mL | Freq: Three times a day (TID) | ORAL | 10 refills | Status: DC

## 2017-06-13 NOTE — Progress Notes (Signed)
ANTICOAGULATION CONSULT NOTE  Pharmacy Consult for apixaban > heparin Indication: DVT  No Known Allergies  Patient Measurements: Height: 5\' 10"  (177.8 cm) Weight: 230 lb (104.3 kg) IBW/kg (Calculated) : 73 Heparin Dosing Weight: 75 kg  Vital Signs: Temp: 99.1 F (37.3 C) (03/28 0744) Temp Source: Oral (03/28 0744) BP: 138/96 (03/28 0744) Pulse Rate: 93 (03/28 0744)  Labs: Recent Labs    06/11/17 0309  06/12/17 0238 06/12/17 1602 06/13/17 0359  HGB 9.8*  --  9.7*  --  10.5*  HCT 30.6*  --  29.7*  --  32.8*  PLT 475*  --  415*  --  407*  APTT 74*   < > 48* 69* 61*  HEPARINUNFRC 0.65  --  0.33  --  0.60  CREATININE  --   --   --   --  1.08   < > = values in this interval not displayed.    Estimated Creatinine Clearance: 81.4 mL/min (by C-G formula based on SCr of 1.08 mg/dL).  Assessment: [age] yoM previously on IV heparin for VTE treatment transitioned to apixaban on 3/16. Pt noted to have R AKA hematoma. Per VVS plan to switch back to IV heparin on 3/24 with no bolus to allow hematoma and wound to heal. Last dose of apixaban at 0930 on 3/24. Given new hematoma, will dose heparin more conservatively.  aPTT now subtherapeutic at 61. Heparin level is falsely elevated from recent apixaban dosing - will continue to monitor aPTT. CBC stable. No issues with bleeding or the infusion per discussion with RN.  Goal of Therapy:  APTT 66-85 sec Heparin level 0.3-0.5 units/ml Monitor platelets by anticoagulation protocol: Yes   Plan:  -Increase IV heparin to 1350 units/hr -6h aPTT -Monitor daily heparin level/aPTT/CBC, S/Sx bleeding -F/U transition back to apixaban as appropriate  Babs BertinHaley Nesha Counihan, PharmD, BCPS Clinical Pharmacist Clinical phone for 06/13/2017 until 3:30pm: [phone] If after 3:30pm, please call main pharmacy at: [phone] 06/13/2017 9:50 AM   ADDENDUM:  Pharmacy consulted to transition back to apixaban. SCr stable. No bleed issues per discussion with RN.  Plan: D/c  heparin drip at time of 1st dose of apixaban 5mg  PO BID - communicated plan with RN Monitor CBC, s/sx bleeding  Babs BertinHaley Marquay Kruse, PharmD, BCPS Clinical Pharmacist 06/13/2017 9:52 AM

## 2017-06-13 NOTE — Discharge Summary (Signed)
Physician Discharge Summary  Joseph Downs ZOX:[id number] DOB: [birth year] DOA: 05/01/2017  PCP: Patient, No Pcp Per  Admit date: 05/01/2017 Discharge date: 06/13/2017  Admitted From: Home Disposition:  SNF  Recommendations for Outpatient Follow-up:  1. Follow up with SNF provider at earliest convenience with repeat CBC/BMP 2. Follow-up with vascular surgery as scheduled 3. Fall precautions 4. Wound care and wound VAC care as per vascular surgery recommendations   Home Health: No Equipment/Devices: PEG tube/wound VAC  Discharge Condition: Guarded CODE STATUS: Full Diet recommendation: Heart Healthy /PEG tube feeding as per dietary recommendations   brief/Interim Summary: 67 year old male, reportedly homeless with no past medical history other than obstructive uropathy requiring Foley was found down in the cold on 05/01/2017.  He was brought to the ER, hypotensive with elevated lactate.  He was admitted to ICU, developed respiratory distress and required intubation.  He was treated with broad-spectrum antibiotics for septic shock.  He also developed renal failure with creatinine going up to 7 for which she required CVVH.  He has had an extensive hospitalization with complicated course.  He was found to have extensive DVT on bilateral lower extremity Dopplers and echo showed severe RV strain.  CTA could not be done because of renal failure.  He was started on heparin drip.  He eventually had trach and PEG.  He was transferred out of ICU.  He is status post bilateral AKA.  Patient was transferred to [facility] service.  He was switched to Eliquis but then transitioned back to heparin because of stump hematoma as per vascular surgery recommendations.  Vascular surgery subsequently has cleared the patient to be put back on Eliquis and cleared the patient for discharge.  He will be discharged to SNF once bed is available.    Discharge Diagnoses:  Principal Problem:   Sepsis (HCC) Active Problems:    Lactic acidemia   Acute respiratory failure (HCC)   Exposure to weather condition   Hypotension   Non-ST elevation (NSTEMI) myocardial infarction (HCC)   Malnutrition of moderate degree   Acute cystitis with hematuria   Acute deep vein thrombosis (DVT) of proximal vein of both lower extremities (HCC)   Pressure injury of skin   Cardiogenic shock (HCC)   Acute cor pulmonale (HCC)   Acute respiratory failure with hypoxemia (HCC)   Deep vein thrombosis (DVT) of non-extremity vein   SIRS (systemic inflammatory response syndrome) (HCC)   S/P AKA (above knee amputation) bilateral (HCC)   Hypernatremia   Acute blood loss anemia   Encephalopathy   Postoperative pain   Pulmonary embolus (HCC)   Tracheostomy status (HCC)  Septic shock -Resolved.  -Patient has completed antibiotic therapy.  Currently afebrile and hemodynamically stable -Initial urine culture had grown staph lugdunensis.  Initial blood cultures had grown acinetobacter lwoffi.  Repeat blood cultures have been negative  Pseudomonas UTI -Patient subsequently was treated and completed 7 days of intravenous Zosyn till 06/02/2017   Presumed right AKA stump hematoma with concern for wound dehiscence Eliquis stopped 06/08/17 per vascular surgery and was put on heparin drip Vascular surgery following: They have cleared the patient to be restarted on Eliquis and cleared the patient for discharge.  Outpatient follow-up with vascular surgery Continue wound care and wound VAC per vascular surgery recommendations  Hyperkalemia -Stable.  Mild.  Outpatient follow-up  Leukocytosis -Possibly reactive from recent surgery -Cultures negative -Afebrile -Outpatient follow-up  Bilateral lower extremity acute DVT -Status post IVC filter; placed due to patient's history of noncompliance -Currently on  heparin dripdue to hematoma of right stump.  We will switch to Eliquis on discharge.  Presumed massive pulmonary  embolism -Echocardiogram done on 05/02/2017 revealed right heart strain with high suspicion for pulmonary embolism -Resume Eliquis  Frostbite gangrene of feet -Vascular surgery following -Wound care specialist following -Status post bilateral above-knee amputations  Acute blood loss anemia/normocytic anemia -Has been transfused 2 units PRBCs on 05/27/2017 -Hemoglobin stable.  No sign of active bleeding currently  Acute hypoxic respiratory failure status post trach collar which was removed 06/07/17 -Resolved -Breathing comfortably on room air -PCCM followed and signed off on 06/07/2017   AKI on CKD 3 -Resolved.  Creatinine stable.  Outpatient follow-up  Hyponatremia Resolved  Dysphagia status post PEG tube placement -Improving -Patient has been able to tolerate the dysphagia 3 mechanical soft thin liquid diet as per SLP recommendations.  Continue PEG tube feeding as well as per dietary recommendations.  NSTEMI -Elevated troponin possibly related to pulmonary embolism  -Cardiology has signed off.  Continue aspirin  Ambulatory dysfunction  -Continue PT at nursing home.    Discharge Instructions  Discharge Instructions    Call MD for:  difficulty breathing, headache or visual disturbances   Complete by:  As directed    Call MD for:  extreme fatigue   Complete by:  As directed    Call MD for:  hives   Complete by:  As directed    Call MD for:  persistant dizziness or light-headedness   Complete by:  As directed    Call MD for:  persistant nausea and vomiting   Complete by:  As directed    Call MD for:  redness, tenderness, or signs of infection (pain, swelling, redness, odor or green/yellow discharge around incision site)   Complete by:  As directed    Call MD for:  severe uncontrolled pain   Complete by:  As directed    Call MD for:  temperature >100.4   Complete by:  As directed    Diet - low sodium heart healthy   Complete by:  As directed    Discharge  instructions   Complete by:  As directed    Wound care and wound VAC care as per vascular surgery recommendations Fall precautions Activity as per PT recommendations PEG tube feeding as per dietary recommendations   Increase activity slowly   Complete by:  As directed      Allergies as of 06/13/2017   No Known Allergies     Medication List    TAKE these medications   apixaban 5 MG Tabs tablet Commonly known as:  ELIQUIS Take 1 tablet (5 mg total) by mouth 2 (two) times daily.   aspirin 81 MG chewable tablet Place 1 tablet (81 mg total) into feeding tube daily. Start taking on:  06/14/2017   camphor-menthol lotion Commonly known as:  SARNA Apply topically as needed for itching.   collagenase ointment Commonly known as:  SANTYL Apply topically daily. Start taking on:  06/14/2017   feeding supplement (JEVITY 1.5 CAL/FIBER) Liqd Place 237 mLs into feeding tube 3 (three) times daily with meals.   feeding supplement (ENSURE ENLIVE) Liqd Take 237 mLs by mouth 3 (three) times daily between meals.   finasteride 5 MG tablet Commonly known as:  PROSCAR Take 5 mg by mouth daily.   free water Soln Place 250 mLs into feeding tube every 8 (eight) hours.   haloperidol 1 MG tablet Commonly known as:  HALDOL 1mg  in Am and 2mg  at  bedtime   Melatonin 3 MG Tabs Take 2 tablets (6 mg total) by mouth at bedtime.   multivitamin with minerals Tabs tablet Take 1 tablet by mouth daily. Start taking on:  06/14/2017   oxyCODONE 5 MG immediate release tablet Commonly known as:  Oxy IR/ROXICODONE Take 1 tablet (5 mg total) by mouth every 6 (six) hours as needed for severe pain (pain related to pressure wound and dressing changes).   tamsulosin 0.4 MG Caps capsule Commonly known as:  FLOMAX Take 1 capsule (0.4 mg total) by mouth daily after supper.   thiamine 100 MG tablet Place 1 tablet (100 mg total) into feeding tube daily. Start taking on:  06/14/2017       Contact information  for follow-up providers    Chuck Hint, MD In 2 weeks.   Specialties:  Vascular Surgery, Cardiology Why:  Office will call you to arrange your appt (sent) Contact information: [address] [place] [address] [phone]            Contact information for after-discharge care    Destination    [facility] .   Service:  Skilled Nursing Contact information: [address] [place] [address] [phone]                 No Known Allergies  Consultations: Vascular surgery/orthopedic surgery/PCCM/cardiology      Procedures/Studies: Dg Abd 1 View  Result Date: 05/15/2017 CLINICAL DATA:  67 year old male status post NG tube placement. EXAM: ABDOMEN - 1 VIEW COMPARISON:  Abdominal radiograph dated 05/14/2017 FINDINGS: An enteric tube extends into the distal stomach and folds back with side-port in the body of the stomach and tip in the fundus. No bowel dilatation is noted upper abdomen. IVC filter is seen. The osseous structures and soft tissues appear unremarkable. Bibasilar atelectatic changes noted. IMPRESSION: Enteric tube with tip in the stomach. Electronically Signed   By: Elgie Collard M.D.   On: 05/15/2017 23:57   Dg Abd 1 View  Result Date: 05/14/2017 CLINICAL DATA:  OG tube placement. EXAM: ABDOMEN - 1 VIEW COMPARISON:  05/11/2017. FINDINGS: OG tube noted with tip in the upper stomach and side hole at the gastroesophageal junction. Advancement of approximately 8 cm should be considered. IVC filter noted with tip at the L2 level. Slightly prominent air-filled loops of small and large bowel noted. Adynamic ileus could present this fashion. IMPRESSION: OG tube noted with tip in the upper stomach and side hole at the gastroesophageal junction. Advancement of approximately 8 cm should be considered. Persistent slightly prominent air-filled loops of small and large bowel. Electronically Signed   By: Maisie Fus   Register   On: 05/14/2017 12:42   Ct Head Wo Contrast  Result Date: 05/23/2017 CLINICAL DATA:  67 year old currently anticoagulated with heparin who fell out of his bed and sustained minor head trauma. EXAM: CT HEAD WITHOUT CONTRAST TECHNIQUE: Contiguous axial images were obtained from the base of the skull through the vertex without intravenous contrast. COMPARISON:  07/08/2015. FINDINGS: Brain: Ventricular system normal in size and appearance for age. Mild age related cortical atrophy. No mass lesion. No midline shift. No acute hemorrhage or hematoma. No extra-axial fluid collections. No evidence of acute infarction. Physiologic calcifications in the basal ganglia. Vascular: No visible atherosclerosis involving the carotid siphons or the vertebrobasilar system. No hyperdense vessel. Skull: No skull fracture or other focal osseous abnormality involving the skull. Sinuses/Orbits: Visualized paranasal sinuses, bilateral mastoid air cells and bilateral  middle ear cavities well-aerated. 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 Visualized orbits and globes normal in appearance. Other: None. IMPRESSION: No acute intracranial abnormality. Electronically Signed   By: Hulan Saas M.D.   On: 05/23/2017 17:27   Dg Chest Port 1 View  Result Date: 05/25/2017 CLINICAL DATA:  Sepsis EXAM: PORTABLE CHEST 1 VIEW COMPARISON:  May 17, 2017 FINDINGS: The tracheostomy tube is in good position. The heart, hila, and mediastinum are normal. Mild bibasilar atelectasis. The lungs are otherwise clear. No other acute abnormalities. IMPRESSION: Tracheostomy tube.  No cause for sepsis identified. Electronically Signed   By: Gerome Sam III M.D   On: 05/25/2017 07:57   Dg Chest Port 1 View  Result Date: 05/17/2017 CLINICAL DATA:  Endotracheal tube EXAM: PORTABLE CHEST 1 VIEW COMPARISON:  05/16/2017 FINDINGS: Tracheostomy tube in place. Left IJ line with tip at the SVC. There is a low volume chest with  right more than left streaky opacity. No edema, effusion, or pneumothorax. Normal heart size. IMPRESSION: 1. Remaining hardware in stable position. 2. Stable chest. Low volumes with atelectatic opacities in the right more than left. Electronically Signed   By: Marnee Spring M.D.   On: 05/17/2017 06:50   Dg Chest Port 1 View  Result Date: 05/16/2017 CLINICAL DATA:  67 year old male found down with hypothermia, lactic acidosis, bilateral DVTs. Tracheostomy placed yesterday. EXAM: PORTABLE CHEST 1 VIEW COMPARISON:  05/15/2017 and earlier. FINDINGS: Portable AP semi upright view at 0444 hours. Tracheostomy tube remains in place, the internal portion is mildly oblique today compared to yesterday. Stable bilateral IJ central lines. Enteric tube loops in the left upper quadrant. Stable lung volumes. Continued veiling opacity at the right lung base. No pneumothorax, pulmonary edema or other confluent opacity. Stable ventilation since yesterday. Visible bowel gas pattern within normal limits. IMPRESSION: 1. Tracheostomy tube positioning is more oblique compared to yesterday. Recommend bedside evaluation. 2.  Otherwise stable lines and tubes. 3. Stable ventilation with continued mild veiling opacity at the right lung base which could reflect atelectasis, pneumonia, or small effusion. Electronically Signed   By: Odessa Fleming M.D.   On: 05/16/2017 06:51   Dg Chest Port 1 View  Result Date: 05/15/2017 CLINICAL DATA:  Tracheostomy EXAM: PORTABLE CHEST 1 VIEW COMPARISON:  Portable exam 1252 hours compared to 05/14/2017 FINDINGS: Interval placement of tracheostomy tube with tip projecting over tracheal air column. BILATERAL jugular lines with tips projecting over SVC. Normal heart size mediastinal contours. Bibasilar atelectasis RIGHT greater than LEFT with question RIGHT pleural effusion. No pneumothorax. IMPRESSION: New tracheostomy tube without pneumothorax. Bibasilar atelectasis greater on RIGHT with suspect small RIGHT  pleural effusion. Electronically Signed   By: Ulyses Southward M.D.   On: 05/15/2017 13:10   Dg Chest Port 1 View  Result Date: 05/14/2017 CLINICAL DATA:  ET tube placement. EXAM: PORTABLE CHEST 1 VIEW COMPARISON:  05/14/2017 FINDINGS: Endotracheal tube with the tip 4.2 cm above the carina. Nasogastric tube coursing below the diaphragm. Right jugular central venous catheter with the tip projecting over the SVC. Left jugular central venous catheter with the tip projecting over the SVC. Small right pleural effusion with right basilar airspace disease likely reflecting atelectasis. Mild left basilar atelectasis. No pneumothorax. Stable cardiomediastinal silhouette. No acute osseous abnormality. IMPRESSION: 1. Support lines and tubing in satisfactory position. 2. Small right pleural effusion.  Bibasilar atelectasis. Electronically Signed   By: Elige Ko   On: 05/14/2017 12:42   Dg Swallowing Func-speech Pathology  Result Date:  06/04/2017 Objective Swallowing Evaluation: Patient Details: Modified Barium Swallow Name: Melonie FloridaLarry E Downs MRN: [id number] Date of Birth: Dec 23, 1950 Today's Date: 06/04/2017 Time: SLP Start Time (ACUTE ONLY): 0940 -SLP Stop Time (ACUTE ONLY): 0957 SLP Time Calculation (min) (ACUTE ONLY): 17 min Past Medical History: No past medical history on file. Past Surgical History: Past Surgical History: Procedure Laterality Date . AMPUTATION Bilateral 05/24/2017  Procedure: Bilateral AMPUTATION ABOVE KNEE;  Surgeon: Fransisco Hertzhen, Brian L, MD;  [facility];  Service: Vascular;  Laterality: Bilateral; . ESOPHAGOGASTRODUODENOSCOPY N/A 05/16/2017  Procedure: ESOPHAGOGASTRODUODENOSCOPY (EGD);  Surgeon: Violeta Gelinashompson, Burke, MD;  [facility];  Service: Endoscopy;  Laterality: N/A; . IR IVC FILTER PLMT / S&I Lenise Arena/IMG GUID/MOD SED  05/10/2017 . PEG PLACEMENT N/A 05/16/2017  Procedure: PERCUTANEOUS ENDOSCOPIC GASTROSTOMY (PEG) REPLACEMENT;  Surgeon: Violeta Gelinashompson, Burke, MD;  [facility];  Service: Endoscopy;   Laterality: N/A; HPI: 67 year old homeless man found down/13 with hypothermia, shock lactic acidosis. 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 HehadAcinetobacter in blood and staph in urine with elevated PSA. Unable to be weaned from vent (ETT 2/13-2/27), trach placed 2/27, PEG 2/28. FEES recommended Dys 1, honey thick liquids. MBS today for possible liquid upgrade.  No Data Recorded Assessment / Plan / Recommendation CHL IP CLINICAL IMPRESSIONS 06/04/2017 Clinical Impression Significant improvements compared to FEES one week ago. Pt's alertness and awareness have improved and trach now cuffless tolerating PMV (donned during this study). No laryngeal penetration or aspiration obseved and mild intermittent vallecular and pyriform sinus residue without sensation. Verbal cue for additional swallow cleared pharyngeal residue. Protective mechanisms and timng of swallow appropriate. Mastication was rapid and possibly mildly incomplete. Aspiration risk appears mild and recommend Dys 3, thin, straws allowed, pills with water, donn speaking valve during meals and ensure adequate positioning.    SLP Visit Diagnosis Dysphagia, oropharyngeal phase (R13.12) Attention and concentration deficit following -- Frontal lobe and executive function deficit following -- Impact on safety and function Mild aspiration risk;Moderate aspiration risk   CHL IP TREATMENT RECOMMENDATION 06/04/2017 Treatment Recommendations Therapy as outlined in treatment plan below   Prognosis 06/04/2017 Prognosis for Safe Diet Advancement Good Barriers to Reach Goals -- Barriers/Prognosis Comment -- CHL IP DIET RECOMMENDATION 06/04/2017 SLP Diet Recommendations Dysphagia 3 (Mech soft) solids;Thin liquid Liquid Administration via Cup;Straw Medication Administration Whole meds with liquid Compensations Small sips/bites;Slow rate;Clear throat intermittently Postural Changes Seated upright at 90 degrees   CHL IP OTHER RECOMMENDATIONS 06/04/2017  Recommended Consults -- Oral Care Recommendations Oral care BID Other Recommendations --   CHL IP FOLLOW UP RECOMMENDATIONS 06/04/2017 Follow up Recommendations [facility] D[facility] 06/04/2017 Speech Therapy Frequency (ACUTE ONLY) min 2x/week Treatment Duration 2 weeks      CHL IP ORAL PHASE 06/04/2017 Oral Phase Impaired Oral - Pudding Teaspoon -- Oral - Pudding Cup -- Oral - Honey Teaspoon -- Oral - Honey Cup NT Oral - Nectar Teaspoon -- Oral - Nectar Cup WFL Oral - Nectar Straw NT Oral - Thin Teaspoon -- Oral - Thin Cup Lingual pumping Oral - Thin Straw WFL Oral - Puree NT Oral - Mech Soft -- Oral - Regular Impaired mastication Oral - Multi-Consistency -- Oral - Pill -- Oral Phase - Comment --  CHL IP PHARYNGEAL PHASE 06/04/2017 Pharyngeal Phase Impaired Pharyngeal- Pudding Teaspoon -- Pharyngeal -- Pharyngeal- Pudding Cup -- Pharyngeal -- Pharyngeal- Honey Teaspoon -- Pharyngeal -- Pharyngeal- Honey Cup WFL Pharyngeal -- Pharyngeal- Nectar Teaspoon -- Pharyngeal -- Pharyngeal- Nectar  Cup [facility] Pharyngeal Material does not enter airway Pharyngeal- Nectar Straw NT Pharyngeal -- Pharyngeal- Thin Teaspoon -- Pharyngeal -- Pharyngeal- Thin Cup Pharyngeal residue - valleculae;Reduced epiglottic inversion Pharyngeal -- Pharyngeal- Thin Straw Pharyngeal residue - valleculae;Reduced epiglottic inversion;Pharyngeal residue - pyriform Pharyngeal -- Pharyngeal- Puree NT Pharyngeal -- Pharyngeal- Mechanical Soft -- Pharyngeal -- Pharyngeal- Regular WFL Pharyngeal -- Pharyngeal- Multi-consistency -- Pharyngeal -- Pharyngeal- Pill WFL Pharyngeal -- Pharyngeal Comment --  CHL IP CERVICAL ESOPHAGEAL PHASE 06/04/2017 Cervical Esophageal Phase WFL Pudding Teaspoon -- Pudding Cup -- Honey Teaspoon -- Honey Cup -- Nectar Teaspoon -- Nectar Cup -- Nectar Straw -- Thin Teaspoon -- Thin Cup -- Thin Straw -- Puree -- Mechanical Soft -- Regular -- Multi-consistency -- Pill -- Cervical Esophageal Comment -- No  flowsheet data found. Royce Macadamia 06/04/2017, 11:21 AM                Bilateral AKA on 05/24/2017 IVC filter placement Intubation and extubation PEG tube placement Tracheostomy placement and removal  Echo on 05/09/2017 Study Conclusions  - Left ventricle: The cavity size was mildly dilated. Wall   thickness was normal. Systolic function was normal. The estimated   ejection fraction was in the range of 55% to 60%. Wall motion was   normal; there were no regional wall motion abnormalities. - Aortic root: The aortic root was mildly dilated. - Right ventricle: Systolic function was mildly reduced. - Pericardium, extracardiac: A small pericardial effusion was   identified.  Impressions:  - Normal LV systolic function; mild diastolic dysfunction; mild   LVE; mildly dilated aortic root; mild RV dysfunction; small   pericardial effusion.   Subjective: Patient seen and examined at bedside.  He is awake and answers questions.  No overnight fever, nausea or vomiting.  Discharge Exam: Vitals:   06/13/17 0356 06/13/17 0744  BP: 137/90 (!) 138/96  Pulse: 100 93  Resp: (!) 26 (!) 21  Temp: 98.8 F (37.1 C) 99.1 F (37.3 C)  SpO2: 99% 100%   Vitals:   06/12/17 1954 06/12/17 2339 06/13/17 0356 06/13/17 0744  BP: (!) 164/101 (!) 153/99 137/90 (!) 138/96  Pulse: (!) 110 99 100 93  Resp: (!) 23 (!) 30 (!) 26 (!) 21  Temp:  98.7 F (37.1 C) 98.8 F (37.1 C) 99.1 F (37.3 C)  TempSrc:  Oral Oral Oral  SpO2: 98% 100% 99% 100%  Weight:   104.3 kg (230 lb)   Height:        General: Pt is alert, awake, not in acute distress Cardiovascular: Rate controlled, S1/S2 + Respiratory: Bilateral decreased breath sounds at bases  abdominal: Soft, NT, ND, bowel sounds +; PEG tube present Extremities: Bilateral AKA.  Right stump wrapped.     The results of significant diagnostics from this hospitalization (including imaging, microbiology, ancillary and laboratory) are listed  below for reference.     Microbiology: No results found for this or any previous visit (from the past 240 hour(s)).   Labs: BNP (last 3 results) Recent Labs    05/01/17 1954  BNP 208.4*   Basic Metabolic Panel: Recent Labs  Lab 06/08/17 0826 06/10/17 0816 06/13/17 0359  NA 131* 132* 134*  K 4.9 5.3* 5.2*  CL 97* 97* 95*  CO2 25 22 27   GLUCOSE 141* 145* 125*  BUN 27* 28* 25*  CREATININE 1.18 1.09 1.08  CALCIUM 8.3* 8.4* 9.3  MG  --   --  2.0   Liver Function Tests: Recent Labs  Lab 06/08/17  0826  AST 33  ALT 70*  ALKPHOS 290*  BILITOT 0.6  PROT 7.2  ALBUMIN 2.4*   No results for input(s): LIPASE, AMYLASE in the last 168 hours. No results for input(s): AMMONIA in the last 168 hours. CBC: Recent Labs  Lab 06/09/17 0208 06/10/17 0816 06/11/17 0309 06/12/17 0238 06/13/17 0359  WBC 20.1* 14.7* 16.7* 15.4* 17.0*  HGB 10.3* 9.8* 9.8* 9.7* 10.5*  HCT 32.0* 30.1* 30.6* 29.7* 32.8*  MCV 92.0 91.5 90.3 91.7 91.4  PLT 593* 292 475* 415* 407*   Cardiac Enzymes: No results for input(s): CKTOTAL, CKMB, CKMBINDEX, TROPONINI in the last 168 hours. BNP: Invalid input(s): POCBNP CBG: No results for input(s): GLUCAP in the last 168 hours. D-Dimer No results for input(s): DDIMER in the last 72 hours. Hgb A1c No results for input(s): HGBA1C in the last 72 hours. Lipid Profile No results for input(s): CHOL, HDL, LDLCALC, TRIG, CHOLHDL, LDLDIRECT in the last 72 hours. Thyroid function studies No results for input(s): TSH, T4TOTAL, T3FREE, THYROIDAB in the last 72 hours.  Invalid input(s): FREET3 Anemia work up No results for input(s): VITAMINB12, FOLATE, FERRITIN, TIBC, IRON, RETICCTPCT in the last 72 hours. Urinalysis    Component Value Date/Time   COLORURINE YELLOW 05/25/2017 0654   APPEARANCEUR CLEAR 05/25/2017 0654   LABSPEC 1.013 05/25/2017 0654   PHURINE 6.0 05/25/2017 0654   GLUCOSEU 50 (A) 05/25/2017 0654   HGBUR MODERATE (A) 05/25/2017 0654    BILIRUBINUR NEGATIVE 05/25/2017 0654   KETONESUR NEGATIVE 05/25/2017 0654   PROTEINUR NEGATIVE 05/25/2017 0654   NITRITE POSITIVE (A) 05/25/2017 0654   LEUKOCYTESUR TRACE (A) 05/25/2017 0654   Sepsis Labs Invalid input(s): PROCALCITONIN,  WBC,  LACTICIDVEN Microbiology No results found for this or any previous visit (from the past 240 hour(s)).   Time coordinating discharge: 35 minutes  SIGNED:   Glade Lloyd, MD  [facility] 06/13/2017, 9:21 AM Pager: [phone]  If 7PM-7AM, please contact night-coverage [email or web address] Password [email or web address]

## 2017-06-13 NOTE — Progress Notes (Addendum)
  Progress Note    06/13/2017 9:10 AM 20 Days Post-Op  Subjective:  No complaints  Tm 99.1  Vitals:   06/13/17 0356 06/13/17 0744  BP: 137/90 (!) 138/96  Pulse: 100 93  Resp: (!) 26 (!) 21  Temp: 98.8 F (37.1 C) 99.1 F (37.3 C)  SpO2: 99% 100%    Physical Exam: Incisions:  Wound vac right stump with good seal; left stump incision healing well with staples in tact.    CBC    Component Value Date/Time   WBC 17.0 (H) 06/13/2017 0359   RBC 3.59 (L) 06/13/2017 0359   HGB 10.5 (L) 06/13/2017 0359   HCT 32.8 (L) 06/13/2017 0359   PLT 407 (H) 06/13/2017 0359   MCV 91.4 06/13/2017 0359   MCH 29.2 06/13/2017 0359   MCHC 32.0 06/13/2017 0359   RDW 18.1 (H) 06/13/2017 0359   LYMPHSABS 1.7 05/17/2017 2300   MONOABS 0.3 05/17/2017 2300   EOSABS 1.1 (H) 05/17/2017 2300   BASOSABS 0.0 05/17/2017 2300    BMET    Component Value Date/Time   NA 134 (L) 06/13/2017 0359   K 5.2 (H) 06/13/2017 0359   CL 95 (L) 06/13/2017 0359   CO2 27 06/13/2017 0359   GLUCOSE 125 (H) 06/13/2017 0359   BUN 25 (H) 06/13/2017 0359   CREATININE 1.08 06/13/2017 0359   CALCIUM 9.3 06/13/2017 0359   GFRNONAA >60 06/13/2017 0359   GFRAA >60 06/13/2017 0359    INR    Component Value Date/Time   INR 1.21 05/10/2017 0650     Intake/Output Summary (Last 24 hours) at 06/13/2017 0910 Last data filed at 06/13/2017 0600 Gross per 24 hour  Intake 650 ml  Output 2000 ml  Net -1350 ml     Assessment/Plan:  67 y.o. male is s/p bilaterally above knee amputation  20 Days Post-Op  -wound vac with good seal; okay per vascular surgery to convert back to Eliquis.   -left stump healing nicely-may be able to get staples out today.  Will d/w Dr. Edilia Boickson.  He would like to wait until 4 weeks to get the staples out.  We will have the pt return in a couple of weeks and at that time will get staples out.  Our office will arrange appointment -okay for SNF today per vascular.    Doreatha MassedSamantha Maribell Demeo,  PA-C [facility] [phone] 06/13/2017 9:10 AM

## 2017-06-13 NOTE — Progress Notes (Signed)
Pt being discharged to SNF, sister at bedside, all VSS, Report Called to [place]. All belongings sent with the sister Eber JonesCarolyn

## 2017-06-13 NOTE — Progress Notes (Signed)
ANTICOAGULATION CONSULT NOTE  Pharmacy Consult for apixaban > heparin Indication: DVT  No Known Allergies  Patient Measurements: Height: 5\' 10"  (177.8 cm) Weight: 230 lb (104.3 kg) IBW/kg (Calculated) : 73 Heparin Dosing Weight: 75 kg  Vital Signs: Temp: 99.1 F (37.3 C) (03/28 0744) Temp Source: Oral (03/28 0744) BP: 138/96 (03/28 0744) Pulse Rate: 93 (03/28 0744)  Labs: Recent Labs    06/11/17 0309  06/12/17 0238 06/12/17 1602 06/13/17 0359  HGB 9.8*  --  9.7*  --  10.5*  HCT 30.6*  --  29.7*  --  32.8*  PLT 475*  --  415*  --  407*  APTT 74*   < > 48* 69* 61*  HEPARINUNFRC 0.65  --  0.33  --  0.60  CREATININE  --   --   --   --  1.08   < > = values in this interval not displayed.    Estimated Creatinine Clearance: 81.4 mL/min (by C-G formula based on SCr of 1.08 mg/dL).  Assessment: [age] yoM previously on IV heparin for VTE treatment transitioned to apixaban on 3/16. Pt noted to have R AKA hematoma. Per VVS plan to switch back to IV heparin on 3/24 with no bolus to allow hematoma and wound to heal. Last dose of apixaban at 0930 on 3/24. Given new hematoma, will dose heparin more conservatively.  aPTT now subtherapeutic at 61. Heparin level is falsely elevated from recent apixaban dosing - will continue to monitor aPTT. CBC stable. No issues with bleeding or the infusion per discussion with RN.  Goal of Therapy:  APTT 66-85 sec Heparin level 0.3-0.5 units/ml Monitor platelets by anticoagulation protocol: Yes   Plan:  -Increase IV heparin to 1350 units/hr -6h aPTT -Monitor daily heparin level/aPTT/CBC, S/Sx bleeding -F/U transition back to apixaban as appropriate  Babs BertinHaley Ogechi Kuehnel, PharmD, BCPS Clinical Pharmacist Clinical phone for 06/13/2017 until 3:30pm: [phone] If after 3:30pm, please call main pharmacy at: [phone] 06/13/2017 8:29 AM

## 2017-06-13 NOTE — Progress Notes (Addendum)
Clinical Social Worker facilitated patient discharge including contacting patient family and facility to confirm patient discharge plans.  Clinical information faxed to facility and family agreeable with plan.  CSW [facility]  RN to call [phone] (pt will go in room 108) for report prior to discharge.  Clinical Social Worker will sign off for now as social work intervention is no longer needed. Please consult us again if new need arises.  Marrianne MoodAshley Odena Mcquaid, MSW, [employer]CSWA [phone]

## 2017-06-13 NOTE — NC FL2 (Signed)
[place] MEDICAID FL2 LEVEL OF CARE SCREENING TOOL     IDENTIFICATION  Patient Name: Joseph Downs Birthdate: 08/02/1950 Sex: male Admission Date (Current Location): 05/01/2017  [facility] and [place] Number:  Producer, television/film/video and Address:  [facility], [address], [place], [place] [address]      Provider Number: [id number]  Attending Physician Name and Address:  Glade Lloyd, MD  Relative Name and Phone Number:  Hunt Oris, [phone]    Current Level of Care: Hospital Recommended Level of Care: Skilled Nursing Facility Prior Approval Number:    Date Approved/Denied:   PASRR Number: [id number]  Discharge Plan: SNF    Current Diagnoses: Patient Active Problem List   Diagnosis Date Noted  . Tracheostomy status (HCC)   . Deep vein thrombosis (DVT) of non-extremity vein   . SIRS (systemic inflammatory response syndrome) (HCC)   . S/P AKA (above knee amputation) bilateral (HCC)   . Hypernatremia   . Acute blood loss anemia   . Encephalopathy   . Postoperative pain   . Pulmonary embolus (HCC)   . Acute respiratory failure with hypoxemia (HCC)   . Cardiogenic shock (HCC)   . Acute cor pulmonale (HCC)   . Pressure injury of skin 05/04/2017  . Acute cystitis with hematuria   . Sepsis (HCC)   . Acute deep vein thrombosis (DVT) of proximal vein of both lower extremities (HCC)   . Malnutrition of moderate degree 05/02/2017  . Acute respiratory failure (HCC)   . Exposure to weather condition   . Hypotension   . Non-ST elevation (NSTEMI) myocardial infarction (HCC)   . Lactic acidemia 05/01/2017  . Acute urinary retention   . AKI (acute kidney injury) (HCC)   . Benign prostatic hyperplasia with urinary retention   . ARF (acute renal failure) (HCC) 03/25/2017    Orientation RESPIRATION BLADDER Height & Weight     Self, Time, Situation  Normal Incontinent, External catheter Weight: 230 lb (104.3 kg) Height:  5\' 10"  (177.8  cm)  BEHAVIORAL SYMPTOMS/MOOD NEUROLOGICAL BOWEL NUTRITION STATUS      Incontinent Diet(please see discharge summary. )  AMBULATORY STATUS COMMUNICATION OF NEEDS Skin   Extensive Assist(pt has had bilateral AKA) Verbally Wound Vac                       Personal Care Assistance Level of Assistance  Bathing, Feeding, Dressing Bathing Assistance: Maximum assistance Feeding assistance: Limited assistance Dressing Assistance: Maximum assistance     Functional Limitations Info  Sight, Hearing, Speech Sight Info: Adequate Hearing Info: Adequate Speech Info: Adequate    SPECIAL CARE FACTORS FREQUENCY  PT (By licensed PT), OT (By licensed OT), Speech therapy     PT Frequency: 5 times a week  OT Frequency: 5 times a week      Speech Therapy Frequency: 5 times a week       Contractures Contractures Info: Not present    Additional Factors Info  Code Status, Allergies Code Status Info: Full  Allergies Info: NKA           Current Medications (06/13/2017):  This is the current hospital active medication list Current Facility-Administered Medications  Medication Dose Route Frequency Provider Last Rate Last Dose  . acetaminophen (TYLENOL) solution 650 mg  650 mg Per Tube Q6H PRN Della Goo, RPH   650 mg at 06/06/17 1846  . acetaminophen (TYLENOL) suppository 325 mg  325 mg Rectal Q4H PRN Gaye Pollack,  Myrtie NeitherEmily S, RPH      . albuterol (PROVENTIL) (2.5 MG/3ML) 0.083% nebulizer solution 2.5 mg  2.5 mg Nebulization Q4H PRN Edsel PetrinMikhail, Maryann, DO      . apixaban (ELIQUIS) tablet 5 mg  5 mg Oral BID Almon HerculesBaird, Haley P, [facility] SurgeryRPH      . aspirin chewable tablet 81 mg  81 mg Per Tube Daily Chilton Siandolph, Tiffany, MD   81 mg at 06/13/17 0900  . camphor-menthol (SARNA) lotion   Topical PRN Darlin DropHall, Carole N, DO      . collagenase (SANTYL) ointment   Topical Daily Edsel PetrinMikhail, Maryann, DO      . diphenoxylate-atropine (LOMOTIL) 2.5-0.025 MG/5ML liquid 5 mL  5 mL Per Tube QID PRN Noralee Stainhoi, Jennifer, DO   5 mL at  06/03/17 1828  . docusate (COLACE) 50 MG/5ML liquid 100 mg  100 mg Per Tube BID PRN Scatliffe, Gypsy BalsamKristen D, MD   100 mg at 05/27/17 0829  . feeding supplement (ENSURE ENLIVE) (ENSURE ENLIVE) liquid 237 mL  237 mL Oral TID BM Hall, Carole N, DO   237 mL at 06/13/17 0904  . feeding supplement (JEVITY 1.5 CAL/FIBER) liquid 237 mL  237 mL Per Tube TID WC Hanley BenAlekh, Kshitiz, MD   237 mL at 06/13/17 0902  . free water 250 mL  250 mL Per Tube Q8H Mikhail, [place]ann, DO   250 mL at 06/13/17 0607  . guaiFENesin-dextromethorphan (ROBITUSSIN DM) 100-10 MG/5ML syrup 5 mL  5 mL Oral Q4H PRN Mikhail, Nita SellsMaryann, DO      . haloperidol (HALDOL) tablet 1 mg  1 mg Oral Daily Danford, Earl Liteshristopher P, MD   1 mg at 06/13/17 0900  . haloperidol (HALDOL) tablet 2 mg  2 mg Oral QHS Alekh, Kshitiz, MD   2 mg at 06/12/17 2038  . lidocaine (PF) (XYLOCAINE) 1 % injection    PRN Malachy MoanMcCullough, Heath, MD   8 mL at 05/10/17 1447  . magnesium sulfate IVPB 2 g 50 mL  2 g Intravenous Daily PRN Emilie RutterEveland, Matthew, PA-C      . Melatonin TABS 6 mg  6 mg Oral QHS Leslye PeerByrum, Robert S, MD   6 mg at 06/12/17 2038  . morphine 2 MG/ML injection 2 mg  2 mg Intravenous Q3H PRN Edsel PetrinMikhail, Maryann, DO   2 mg at 06/13/17 0847  . multivitamin with minerals tablet 1 tablet  1 tablet Oral Daily Dow AdolphHall, Carole N, DO   1 tablet at 06/13/17 [id number]  . ondansetron (ZOFRAN) injection 4 mg  4 mg Intravenous Q6H PRN Emilie RutterEveland, Matthew, PA-C      . oxyCODONE (Oxy IR/ROXICODONE) immediate release tablet 5-10 mg  5-10 mg Oral Q6H PRN Dow AdolphHall, Carole N, DO   10 mg at 06/12/17 1650  . pantoprazole sodium (PROTONIX) 40 mg/20 mL oral suspension 40 mg  40 mg Oral QHS Hall, Carole N, DO   40 mg at 06/12/17 2037  . polyethylene glycol (MIRALAX / GLYCOLAX) packet 17 g  17 g Oral Daily Dow AdolphHall, Carole N, DO   17 g at 06/13/17 0901  . RESOURCE THICKENUP CLEAR   Oral PRN Edsel PetrinMikhail, Maryann, DO      . senna-docusate (Senokot-S) tablet 1 tablet  1 tablet Oral BID Darlin DropHall, Carole N, DO   1 tablet at 06/13/17  0901  . thiamine (VITAMIN B-1) tablet 100 mg  100 mg Per Tube Daily Coralyn HellingSood, Vineet, MD   100 mg at 06/13/17 0900     Discharge Medications: Please see discharge summary for a list of discharge  medications.  Relevant Imaging Results:  Relevant Lab Results:   Additional Information SS# [id number] no longer has trach, but has wound vac  Althea Charon, LCSW

## 2017-06-13 NOTE — Clinical Social Work Placement (Signed)
   CLINICAL SOCIAL WORK PLACEMENT  NOTE  Date:  06/13/2017  Patient Details  Name: Joseph Downs MRN: [id number] Date of Birth: 10/10/1950  Clinical Social Work is seeking post-discharge placement for this patient at the Skilled  Nursing Facility level of care (*CSW will initial, date and re-position this form in  chart as items are completed):  Yes   Patient/family provided with [place] Clinical Social Work Department's list of facilities offering this level of care within the geographic area requested by the patient (or if unable, by the patient's family).  Yes   Patient/family informed of their freedom to choose among providers that offer the needed level of care, that participate in Medicare, Medicaid or managed care program needed by the patient, have an available bed and are willing to accept the patient.  Yes   Patient/family informed of [facility]'s ownership interest in [facility] and [facility], as well as of the fact that they are under no obligation to receive care at these facilities.  PASRR submitted to EDS on       PASRR number received on       Existing PASRR number confirmed on 06/13/17     FL2 transmitted to all facilities in geographic area requested by pt/family on 06/13/17     FL2 transmitted to all facilities within larger geographic area on       Patient informed that his/her managed care company has contracts with or will negotiate with certain facilities, including the following:        Yes   Patient/family informed of bed offers received.  [facility]     Physician recommends and patient chooses bed at      [name] 06/13/17.  Patient to be transferred to facility by [facility]     Patient family notified on 06/13/17 of transfer.  Name of family member notified:  spoke to pt's sister carolyn via phone     PHYSICIAN      Additional Comment:    _______________________________________________ Althea CharonAshley C Yashica Sterbenz, LCSW 06/13/2017, 11:45 AM

## 2017-06-14 ENCOUNTER — Telehealth: Payer: Self-pay | Admitting: Vascular Surgery

## 2017-06-14 NOTE — Telephone Encounter (Signed)
-----   Message from Sharee PimpleMarilyn K McChesney, RN sent at 06/13/2017 10:38 AM EDT ----- Regarding: 1-2 weeks staples   ----- Message ----- From: Dara Lordshyne, Samantha J, PA-C Sent: 06/13/2017   9:17 AM To: Vvs Charge Pool  S/p bilateral AKA and evacuation hematoma right stump.  Will need to f/u with Dr. Edilia Boickson in 1-2 weeks.  He will also need staples out at time of visit.    Thanks

## 2017-06-14 NOTE — Telephone Encounter (Signed)
Unable to leave vm on pt phone. VM not set up. Mailed letter. 06/08/17  LS

## 2017-06-26 ENCOUNTER — Encounter: Payer: Medicare Other | Admitting: Vascular Surgery

## 2017-06-26 LAB — SUSCEPTIBILITY RESULT

## 2017-06-28 ENCOUNTER — Encounter: Payer: Self-pay | Admitting: Family

## 2017-06-28 ENCOUNTER — Emergency Department (HOSPITAL_COMMUNITY)
Admission: EM | Admit: 2017-06-28 | Discharge: 2017-06-28 | Disposition: A | Discharge status: Skilled Nursing Facility | Payer: Medicaid Other | Attending: Emergency Medicine | Admitting: Emergency Medicine

## 2017-06-28 ENCOUNTER — Ambulatory Visit (INDEPENDENT_AMBULATORY_CARE_PROVIDER_SITE_OTHER): Payer: Self-pay | Admitting: Family

## 2017-06-28 ENCOUNTER — Emergency Department (HOSPITAL_COMMUNITY): Payer: Medicaid Other

## 2017-06-28 ENCOUNTER — Other Ambulatory Visit: Payer: Self-pay

## 2017-06-28 ENCOUNTER — Encounter (HOSPITAL_COMMUNITY): Payer: Self-pay | Admitting: Student

## 2017-06-28 VITALS — BP 121/78 | HR 79 | Temp 99.5°F | Resp 18 | Wt 175.0 lb

## 2017-06-28 DIAGNOSIS — Z89611 Acquired absence of right leg above knee: Secondary | ICD-10-CM | POA: Diagnosis not present

## 2017-06-28 DIAGNOSIS — Z86718 Personal history of other venous thrombosis and embolism: Secondary | ICD-10-CM | POA: Diagnosis not present

## 2017-06-28 DIAGNOSIS — I252 Old myocardial infarction: Secondary | ICD-10-CM | POA: Diagnosis not present

## 2017-06-28 DIAGNOSIS — Z7982 Long term (current) use of aspirin: Secondary | ICD-10-CM | POA: Diagnosis not present

## 2017-06-28 DIAGNOSIS — Z7901 Long term (current) use of anticoagulants: Secondary | ICD-10-CM | POA: Insufficient documentation

## 2017-06-28 DIAGNOSIS — Z89612 Acquired absence of left leg above knee: Secondary | ICD-10-CM | POA: Diagnosis not present

## 2017-06-28 DIAGNOSIS — I739 Peripheral vascular disease, unspecified: Secondary | ICD-10-CM

## 2017-06-28 DIAGNOSIS — K9423 Gastrostomy malfunction: Secondary | ICD-10-CM

## 2017-06-28 DIAGNOSIS — I96 Gangrene, not elsewhere classified: Secondary | ICD-10-CM

## 2017-06-28 DIAGNOSIS — Z86711 Personal history of pulmonary embolism: Secondary | ICD-10-CM | POA: Insufficient documentation

## 2017-06-28 DIAGNOSIS — Z79899 Other long term (current) drug therapy: Secondary | ICD-10-CM | POA: Insufficient documentation

## 2017-06-28 MED ORDER — IOPAMIDOL (ISOVUE-300) INJECTION 61%
50.0000 mL | Freq: Once | INTRAVENOUS | Status: AC | PRN
  Administered 2017-06-28: 50 mL

## 2017-06-28 NOTE — ED Notes (Signed)
Attempt to call nursing facility to review discharge paperwork; no answer.

## 2017-06-28 NOTE — ED Notes (Signed)
Unable to contact the facility to give discharge instructions. This was the second attempt.

## 2017-06-28 NOTE — ED Notes (Signed)
Bed: [id number] Expected date:  Expected time:  Means of arrival:  Comments: 67 yo feeding tube out

## 2017-06-28 NOTE — ED Triage Notes (Signed)
Patients feeding tube dislodged, sent to have tube placed.

## 2017-06-28 NOTE — ED Notes (Signed)
Judeth CornfieldStephanie RN aware unable to get [facility] facility to review discharge paperwork. Stephanie RN verbalizes will attempt to call at a later time.

## 2017-06-28 NOTE — Progress Notes (Addendum)
    Postoperative Visit   History of Present Illness  Joseph Downs is a 67 y.o. male who is s/p bilateral above-the-knee amputation on 05-24-17 by Dr. Imogene Burnhen for bilateral foot gangrene.  He resides in [facility] [place]. He returns today for staples removal.   Wound vac on open part of incision right AKA stump, base of wound healthy appearing with beefy red tissue.  No signs of infection in either incision.  Pt denies fever or chills.   He has a gastrostomy tube in place.  He is able to answer questions appropriately.    For VQI Use Only  PRE-ADM LIVING: Nursing home  AMB STATUS: Wheelchair  Physical Examination  Vitals:   06/28/17 1041  BP: 121/78  Pulse: 79  Resp: 18  Temp: 99.5 F (37.5 C)  TempSrc: Oral  SpO2: 99%  Weight: 175 lb (79.4 kg)   Body mass index is 25.11 kg/m.  Palpable bilateral femoral pulses, pt seated in w/c.  Wound vac on open part of incision right AKA stump, base of wound healthy appearing with beefy red tissue.  No signs of infection in either incision.    Medical Decision Making  Joseph Downs is a 67 y.o. male who is s/p bilateral above-the-knee amputation on 05-24-17 by Dr. Imogene Burnhen for bilateral foot gangrene.  He resides in [facility] [place]. All staples removed from bilateral AKA stumps. NH to continue wound vac until wound has granulated enough to start damp to dry NS dressing changes daily. NH to notify us if wound is not healing. Follow up with us as needed.    Charisse MarchSuzanne Bernadine Melecio, RN, MSN, FNP-C [facility] [place] Office: [phone]  06/28/2017, 11:03 AM  Clinic MD: [place]

## 2017-06-28 NOTE — ED Notes (Signed)
[facility] called to transport pt to nursing facility.

## 2017-06-28 NOTE — ED Notes (Signed)
Madilyn Hookees MD attempting to placed 18 french feeding tube at present time.

## 2017-06-28 NOTE — ED Provider Notes (Signed)
[facility]-EMERGENCY DEPT Provider Note   CSN: [id number] Arrival date & time: 06/28/17  1504     History   Chief Complaint Chief Complaint  Patient presents with  . Feeding tube dislodged.    HPI Joseph Downs is a 67 y.o. male.  The history is provided by the patient and medical records. No language interpreter was used.    Joseph Downs is a 67 y.o. male who presents to the Emergency Department complaining of feeding tube problem. He presents for evaluation of feeding tube displacement. This morning his feeding tube tip broke off has been leaking. Since that time gastric contents have been pouring out of his feeding tube. He denies any abdominal pain, fevers, vomiting. Symptoms are moderate and constant nature.  Past Medical History:  Diagnosis Date  . Acidosis   . Acute embolism and thrombosis of deep vein of both lower extremities (HCC)   . Acute respiratory failure (HCC)   . Hyperlipemia   . Hyperosmolality and/or hypernatremia   . Hypotension, unspecified   . PVD (peripheral vascular disease) [facility])     Patient Active Problem List   Diagnosis Date Noted  . Tracheostomy status (HCC)   . Deep vein thrombosis (DVT) of non-extremity vein   . SIRS (systemic inflammatory response syndrome) (HCC)   . S/P AKA (above knee amputation) bilateral (HCC)   . Hypernatremia   . Acute blood loss anemia   . Encephalopathy   . Postoperative pain   . Pulmonary embolus (HCC)   . Acute respiratory failure with hypoxemia (HCC)   . Cardiogenic shock (HCC)   . Acute cor pulmonale (HCC)   . Pressure injury of skin 05/04/2017  . Acute cystitis with hematuria   . Sepsis (HCC)   . Acute deep vein thrombosis (DVT) of proximal vein of both lower extremities (HCC)   . Malnutrition of moderate degree 05/02/2017  . Acute respiratory failure (HCC)   . Exposure to weather condition   . Hypotension   . Non-ST elevation (NSTEMI) myocardial infarction (HCC)   . Lactic  acidemia 05/01/2017  . Acute urinary retention   . AKI (acute kidney injury) (HCC)   . Benign prostatic hyperplasia with urinary retention   . ARF (acute renal failure) (HCC) 03/25/2017    Past Surgical History:  Procedure Laterality Date  . AMPUTATION Bilateral 05/24/2017   Procedure: Bilateral AMPUTATION ABOVE KNEE;  Surgeon: Fransisco Hertz, MD;  Location: [facility] OR;  Service: Vascular;  Laterality: Bilateral;  . ESOPHAGOGASTRODUODENOSCOPY N/A 05/16/2017   Procedure: ESOPHAGOGASTRODUODENOSCOPY (EGD);  Surgeon: Violeta Gelinas, MD;  Location: [facility] ENDOSCOPY;  Service: Endoscopy;  Laterality: N/A;  . IR IVC FILTER PLMT / S&I Lenise Arena GUID/MOD SED  05/10/2017  . PEG PLACEMENT N/A 05/16/2017   Procedure: PERCUTANEOUS ENDOSCOPIC GASTROSTOMY (PEG) REPLACEMENT;  Surgeon: Violeta Gelinas, MD;  Location: [facility] ENDOSCOPY;  Service: Endoscopy;  Laterality: N/A;        Home Medications    Prior to Admission medications   Medication Sig Start Date End Date Taking? Authorizing Provider  apixaban (ELIQUIS) 5 MG TABS tablet Take 1 tablet (5 mg total) by mouth 2 (two) times daily. 06/13/17  Yes Glade Lloyd, MD  aspirin 81 MG chewable tablet Place 1 tablet (81 mg total) into feeding tube daily. 06/14/17  Yes Glade Lloyd, MD  atorvastatin (LIPITOR) 20 MG tablet Take 20 mg by mouth at bedtime.   Yes [provider]  feeding supplement, ENSURE ENLIVE, (ENSURE ENLIVE) LIQD Take 237 mLs by  mouth 3 (three) times daily between meals. 06/13/17  Yes Glade Lloyd, MD  finasteride (PROSCAR) 5 MG tablet Take 5 mg by mouth daily.   Yes [provider]  gabapentin (NEURONTIN) 100 MG capsule Take 100 mg by mouth 2 (two) times daily.   Yes [provider]  HYDROcodone-acetaminophen (NORCO) 10-325 MG tablet Take 1 tablet by mouth every 6 (six) hours as needed for moderate pain or severe pain.   Yes [provider]  Multiple Vitamin (MULTIVITAMIN WITH MINERALS) TABS tablet Take 1 tablet by mouth  daily. 06/14/17  Yes Glade Lloyd, MD  Nutritional Supplements (FEEDING SUPPLEMENT, JEVITY 1.5 CAL/FIBER,) LIQD Place 237 mLs into feeding tube 3 (three) times daily with meals. 06/13/17  Yes Glade Lloyd, MD  oxyCODONE (OXY IR/ROXICODONE) 5 MG immediate release tablet Take 1 tablet (5 mg total) by mouth every 6 (six) hours as needed for severe pain (pain related to pressure wound and dressing changes). 06/13/17  Yes Glade Lloyd, MD  tamsulosin (FLOMAX) 0.4 MG CAPS capsule Take 1 capsule (0.4 mg total) by mouth daily after supper. 03/26/17  Yes Vassie Loll, MD  thiamine 100 MG tablet Place 1 tablet (100 mg total) into feeding tube daily. 06/14/17  Yes Glade Lloyd, MD  traZODone (DESYREL) 50 MG tablet Take 25 mg by mouth at bedtime.   Yes [provider]  Water For Irrigation, Sterile (FREE WATER) SOLN Place 250 mLs into feeding tube every 8 (eight) hours. 06/13/17  Yes Glade Lloyd, MD  camphor-menthol [facility]) lotion Apply topically as needed for itching. Patient not taking: Reported on 06/28/2017 06/13/17   Glade Lloyd, MD  collagenase (SANTYL) ointment Apply topically daily. Patient not taking: Reported on 06/28/2017 06/14/17   Glade Lloyd, MD  haloperidol (HALDOL) 1 MG tablet 1mg  in Am and 2mg  at bedtime Patient not taking: Reported on 06/28/2017 06/13/17   Glade Lloyd, MD  Melatonin 3 MG TABS Take 2 tablets (6 mg total) by mouth at bedtime. Patient not taking: Reported on 06/28/2017 06/13/17   Glade Lloyd, MD    Family History Family History  Problem Relation Age of Onset  . Cancer Father     Social History Social History   Tobacco Use  . Smoking status: Never Smoker  . Smokeless tobacco: Never Used  Substance Use Topics  . Alcohol use: No  . Drug use: No     Allergies   Patient has no known allergies.   Review of Systems Review of Systems  All other systems reviewed and are negative.    Physical Exam Updated Vital Signs BP 103/62 (BP Location:  Right Arm)   Pulse 93   Temp 99.3 F (37.4 C) (Oral)   Resp 18   SpO2 98%   Physical Exam  Constitutional: He is oriented to person, place, and time. He appears well-developed and well-nourished.  HENT:  Head: Normocephalic and atraumatic.  Cardiovascular: Normal rate and regular rhythm.  Pulmonary/Chest: Effort normal. No respiratory distress.  Abdominal: Soft. There is no tenderness. There is no rebound and no guarding.  Gastrostomy tube inLUQ without surrounding edema or erythema, draining gastric contents into glove.   Musculoskeletal:  Bilateral AKA.  Dressing to right posterior AKA site c/d/i.    Neurological: He is alert and oriented to person, place, and time.  Skin: Skin is warm and dry.  Psychiatric: He has a normal mood and affect. His behavior is normal.  Nursing note and vitals reviewed.    ED Treatments / Results  Labs (all  labs ordered are listed, but only abnormal results are displayed) Labs Reviewed - No data to display  EKG None  Radiology Dg Abdomen 1 View  Result Date: 06/28/2017 CLINICAL DATA:  Gastrostomy catheter replacement EXAM: ABDOMEN - 1 VIEW COMPARISON:  None. FINDINGS: 50 mL Isovue was administered into the gastrostomy catheter with 60 mL of water to flush. Gastrostomy catheter is positioned within the stomach. Contrast flows freely from the stomach into the duodenum. No contrast extravasation. There is diffuse stool throughout much of the colon. There is no bowel dilatation or air-fluid level to suggest bowel obstruction. No free air. There is a filter in the inferior vena cava. There is arthropathy in each hip joint. IMPRESSION: Gastrostomy is position within the stomach without contrast extravasation. Contrast flows freely through stomach into duodenum. No bowel obstruction or free air. Diffuse stool throughout colon. Electronically Signed   By: Bretta BangWilliam  Woodruff III M.D.   On: 06/28/2017 18:14    Procedures Gastrostomy tube  replacement Date/Time: 06/28/2017 11:46 PM Performed by: Tilden Fossaees, Ayme Short, MD Authorized by: Tilden Fossaees, Sherby Moncayo, MD  Consent: Verbal consent obtained. Risks and benefits: risks, benefits and alternatives were discussed Consent given by: patient Patient identity confirmed: verbally with patient and arm band Local anesthesia used: no  Anesthesia: Local anesthesia used: no Comments: 718 French gastrostomy tube was removed. A new 18 French gastrostomy tube was placed and balloon was inflated with 10 mL of sterile water. Patient tolerated procedure well.    (including critical care time)  Medications Ordered in ED Medications  iopamidol (ISOVUE-300) 61 % injection 50 mL (50 mLs Other Contrast Given 06/28/17 1803)     Initial Impression / Assessment and Plan / ED Course  I have reviewed the triage vital signs and the nursing notes.  Pertinent labs & imaging results that were available during my care of the patient were reviewed by me and considered in my medical decision making (see chart for details).    Patient here for evaluation of broken tip of gastrostomy tube, leaking gastric contents into a glove. The broken tube was removed and a new tube was placed. Placement was confirmed with drainage of gastric contents as well as contrast on plain film. Plan to discharge home with outpatient follow-up and return precautions. At time of discharge patient's ex-wife is at the bedside and she is requesting bilateral wrist splint for carpal tunnel syndrome - will provide with plan for outpatient follow-up.  Final Clinical Impressions(s) / ED Diagnoses   Final diagnoses:  Gastrostomy tube dysfunction [facility](HCC)    ED Discharge Orders    None       Tilden Fossaees, Aaliyan Brinkmeier, MD 06/28/17 2355

## 2017-06-30 ENCOUNTER — Encounter: Payer: Self-pay | Admitting: Family

## 2017-08-02 ENCOUNTER — Ambulatory Visit: Payer: Medicare Other | Admitting: Family

## 2017-08-02 ENCOUNTER — Other Ambulatory Visit: Payer: Self-pay

## 2017-08-02 ENCOUNTER — Ambulatory Visit (INDEPENDENT_AMBULATORY_CARE_PROVIDER_SITE_OTHER): Payer: Self-pay | Admitting: Family

## 2017-08-02 ENCOUNTER — Encounter: Payer: Self-pay | Admitting: Family

## 2017-08-02 VITALS — BP 114/77 | HR 82 | Temp 97.3°F | Resp 16 | Wt 160.0 lb

## 2017-08-02 DIAGNOSIS — Z89612 Acquired absence of left leg above knee: Secondary | ICD-10-CM

## 2017-08-02 DIAGNOSIS — I96 Gangrene, not elsewhere classified: Secondary | ICD-10-CM

## 2017-08-02 DIAGNOSIS — Z89611 Acquired absence of right leg above knee: Secondary | ICD-10-CM

## 2017-08-02 NOTE — Progress Notes (Signed)
Postoperative Visit   History of Present Illness  Joseph Downs is a 67 y.o. 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  He resides in [facility] in [place].  I last evaluated pt on 06-28-17. At that time all staples were removed from bilateral AKA stumps. NH was to continue wound vac until wound has granulated enough to start damp to dry NS dressing changes daily. NH to notify us if wound is not healing. Follow up with [place] in 1 month for wound check.  He returns today for wound check of right AKA stump small remaining wound. Pt states wound vac removed 3 days ago. He denies fever or chills, denies paoin.  He has a gastrostomy tube in place, but states he has been taking po for a while.  He is able to answer questions appropriately. His upper body is fit, with good muscle tone.  He denies having DM, states he never used tobacco. He states he fell asleep outside and his legs got frostbite.  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    For VQI Use Only  PRE-ADM LIVING: Nursing home  AMB STATUS: Wheelchair  Past Medical History:  Diagnosis Date  . Acidosis   . Acute embolism and thrombosis of deep vein of both lower extremities (HCC)   . Acute respiratory failure (HCC)   . Hyperlipemia   . Hyperosmolality and/or hypernatremia   . Hypotension, unspecified   . PVD (peripheral vascular disease) (HCC)     Past Surgical History:  Procedure Laterality Date  . AMPUTATION Bilateral 05/24/2017   Procedure: Bilateral AMPUTATION ABOVE KNEE;  Surgeon: Fransisco Hertz, MD;  Location: [facility] OR;  Service: Vascular;  Laterality: Bilateral;  . ESOPHAGOGASTRODUODENOSCOPY N/A 05/16/2017   Procedure: ESOPHAGOGASTRODUODENOSCOPY (EGD);  Surgeon: Violeta Gelinas, MD;  Location: [facility] ENDOSCOPY;  Service: Endoscopy;  Laterality: N/A;  . IR IVC FILTER PLMT / S&I Lenise Arena GUID/MOD SED   05/10/2017  . PEG PLACEMENT N/A 05/16/2017   Procedure: PERCUTANEOUS ENDOSCOPIC GASTROSTOMY (PEG) REPLACEMENT;  Surgeon: Violeta Gelinas, MD;  Location: [facility] ENDOSCOPY;  Service: Endoscopy;  Laterality: N/A;    Social History   Socioeconomic History  . Marital status: Divorced    Spouse name: Not on file  . Number of children: Not on file  . Years of education: Not on file  . Highest education level: Not on file  Occupational History  . Not on file  Social Needs  . Financial resource strain: Not on file  . Food insecurity:    Worry: Not on file    Inability: Not on file  . Transportation needs:    Medical: Not on file    Non-medical: Not on file  Tobacco Use  . Smoking status: Never Smoker  . Smokeless tobacco: Never Used  Substance and Sexual Activity  . Alcohol use: No  . Drug use: No  . Sexual activity: Not on file  Lifestyle  . Physical activity:    Days per week: Not on file    Minutes per session: Not on file  . Stress: Not on file  Relationships  . Social connections:    Talks on phone: Not on file    Gets together: Not on file    Attends religious service: Not on file    Active member of club or organization: Not on file    Attends meetings of clubs or organizations: Not  on file    Relationship status: Not on file  . Intimate partner violence:    Fear of current or ex partner: Not on file    Emotionally abused: Not on file    Physically abused: Not on file    Forced sexual activity: Not on file  Other Topics Concern  . Not on file  Social History Narrative  . Not on file    No Known Allergies  Current Outpatient Medications on File Prior to Visit  Medication Sig Dispense Refill  . apixaban (ELIQUIS) 5 MG TABS tablet Take 1 tablet (5 mg total) by mouth 2 (two) times daily. 60 tablet 0  . aspirin 81 MG chewable tablet Place 1 tablet (81 mg total) into feeding tube daily. 30 tablet 0  . atorvastatin (LIPITOR) 20 MG tablet Take 20 mg by mouth at bedtime.      . feeding supplement, ENSURE ENLIVE, (ENSURE ENLIVE) LIQD Take 237 mLs by mouth 3 (three) times daily between meals. 237 mL 12  . finasteride (PROSCAR) 5 MG tablet Take 5 mg by mouth daily.    Marland Kitchen gabapentin (NEURONTIN) 100 MG capsule Take 100 mg by mouth 2 (two) times daily.    . haloperidol (HALDOL) 1 MG tablet  in Am and  at bedtime 20 tablet 0  . Melatonin 3 MG TABS Take 2 tablets (6 mg total) by mouth at bedtime. 10 tablet 0  . Multiple Vitamin (MULTIVITAMIN WITH MINERALS) TABS tablet Take 1 tablet by mouth daily. 30 tablet 0  . Nutritional Supplements (FEEDING SUPPLEMENT, JEVITY 1.5 CAL/FIBER,) LIQD Place 237 mLs into feeding tube 3 (three) times daily with meals. 1000 mL 10  . tamsulosin (FLOMAX) 0.4 MG CAPS capsule Take 1 capsule (0.4 mg total) by mouth daily after supper. 30 capsule 1  . thiamine 100 MG tablet Place 1 tablet (100 mg total) into feeding tube daily. 30 tablet 0  . traZODone (DESYREL) 50 MG tablet Take 25 mg by mouth at bedtime.    . Water For Irrigation, Sterile (FREE WATER) SOLN Place 250 mLs into feeding tube every 8 (eight) hours. 1000 mL 5  . camphor-menthol (SARNA) lotion Apply topically as needed for itching. (Patient not taking: Reported on 06/28/2017) 222 mL 0  . collagenase (SANTYL) ointment Apply topically daily. (Patient not taking: Reported on 06/28/2017) 15 g 0  . HYDROcodone-acetaminophen (NORCO) 10-325 MG tablet Take 1 tablet by mouth every 6 (six) hours as needed for moderate pain or severe pain.    Marland Kitchen oxyCODONE (OXY IR/ROXICODONE) 5 MG immediate release tablet Take 1 tablet (5 mg total) by mouth every 6 (six) hours as needed for severe pain (pain related to pressure wound and dressing changes). (Patient not taking: Reported on 08/02/2017) 14 tablet 0   No current facility-administered medications on file prior to visit.        Physical Examination  Vitals:   08/02/17 1340  BP: 114/77  Pulse: 82  Resp: 16  Temp: (!) 97.3 F (36.3 C)  TempSrc:  Oral  SpO2: 99%  Weight: 160 lb (72.6 kg)   Body mass index is 22.96 kg/m.  Palpable bilateral femoral pulses, pt seated in w/c.   No signs of infection. Left AKA incision is well healed.         All above photos are right AKA with remaining open area healing well 1.5 cm x 1.5 cm, width, 0.3 cm depth.     Medical Decision Making  ROSHARD REZABEK is a 67 y.o.  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  I discussed with Dr. Randie Heinz whether pt needs to return until wound has fully epithelialized since wound has healed this well, and is almost epithelialized, pt has no pain, no signs of infection at the right AKA site, pt to return as needed.   Continue Hydrogel or damp to dry NS daily dressing changes until right AKA wound has fully epithelialized.   He resides in [facility] in [place].     Charisse March, RN, MSN, FNP-C [facility] Office: [phone]  08/02/2017, 1:54 PM  Clinic MD: Ulla Gallo

## 2017-08-07 ENCOUNTER — Other Ambulatory Visit (HOSPITAL_COMMUNITY): Payer: Self-pay | Admitting: Interventional Radiology

## 2017-08-07 DIAGNOSIS — Z95828 Presence of other vascular implants and grafts: Secondary | ICD-10-CM

## 2017-10-08 ENCOUNTER — Other Ambulatory Visit (HOSPITAL_COMMUNITY): Payer: Self-pay | Admitting: Interventional Radiology

## 2017-10-08 DIAGNOSIS — Z95828 Presence of other vascular implants and grafts: Secondary | ICD-10-CM

## 2017-10-24 ENCOUNTER — Ambulatory Visit
Admission: RE | Admit: 2017-10-24 | Discharge: 2017-10-24 | Disposition: A | Discharge status: Home / Self Care | Payer: Medicare Other | Source: Ambulatory Visit | Attending: Interventional Radiology | Admitting: Interventional Radiology

## 2017-10-24 ENCOUNTER — Encounter: Payer: Self-pay | Admitting: Radiology

## 2017-10-24 DIAGNOSIS — Z95828 Presence of other vascular implants and grafts: Secondary | ICD-10-CM

## 2017-10-24 HISTORY — PX: IR RADIOLOGIST EVAL & MGMT: IMG5224

## 2017-10-24 NOTE — Progress Notes (Signed)
Chief Complaint: Patient was seen in follow-up today for  Chief Complaint  Patient presents with  . Follow-up    IVC filter   at the request of Marlina Cataldi  Referring Physician(s): Delories Mauri  History of Present Illness: Joseph Downs is a 67 y.o. male initially seen by me as an inpatient at [facility] in February 2019.  At that time, he was homeless and found down in the elements and was admitted with acute provoked bilateral lower extremity DVT, intermediate to high risk large-volume pulmonary embolism, acute renal failure requiring CRRT, and bilateral lower extremity frostbite.  Due to his significant medical comorbidities and tenuous cardiopulmonary status, dual prophylaxis with IVC filter placement and IV heparin was warranted.  Additionally, the patient subsequently underwent multiple surgeries during which time the anticoagulation was held and the IVC filter was protective.  Ultimately, he underwent bilateral above-the-knee amputations.  He has subsequently healed, recovered and been discharged.  He is currently living in a group home.  Due to the severity of his illness during the hospital, he is uncertain about why he is here and what an IVC filter is.  He is currently in a state of relatively good health.  He denies lower extremity swelling, chest pain or shortness of breath.  He is compliant with his medicines while in the group home and reports that he is doing much better.  Ultimately, he would like to regain his independence and get a place of his own.  I explained to him the clinical situation he faced during his admission last February and the reasons why we placed a potentially retrievable IVC filter.  We also discussed the risks and benefits of leaving the filter in place for continued protection versus removing it if it is no longer required.  He voiced his understanding and I was able to answer all of his questions.  Past Medical History:    Diagnosis Date  . Acidosis   . Acute embolism and thrombosis of deep vein of both lower extremities (HCC)   . Acute respiratory failure (HCC)   . Hyperlipemia   . Hyperosmolality and/or hypernatremia   . Hypotension, unspecified   . PVD (peripheral vascular disease) (HCC)     Past Surgical History:  Procedure Laterality Date  . AMPUTATION Bilateral 05/24/2017   Procedure: Bilateral AMPUTATION ABOVE KNEE;  Surgeon: Fransisco Hertz, MD;  Location: [facility] OR;  Service: Vascular;  Laterality: Bilateral;  . ESOPHAGOGASTRODUODENOSCOPY N/A 05/16/2017   Procedure: ESOPHAGOGASTRODUODENOSCOPY (EGD);  Surgeon: Violeta Gelinas, MD;  Location: [facility] ENDOSCOPY;  Service: Endoscopy;  Laterality: N/A;  . IR IVC FILTER PLMT / S&I Lenise Arena GUID/MOD SED  05/10/2017  . IR RADIOLOGIST EVAL & MGMT  10/24/2017  . PEG PLACEMENT N/A 05/16/2017   Procedure: PERCUTANEOUS ENDOSCOPIC GASTROSTOMY (PEG) REPLACEMENT;  Surgeon: Violeta Gelinas, MD;  Location: [facility] ENDOSCOPY;  Service: Endoscopy;  Laterality: N/A;    Allergies: Patient has no known allergies.  Medications: Prior to Admission medications   Medication Sig Start Date End Date Taking? Authorizing Provider  apixaban (ELIQUIS) 5 MG TABS tablet Take 1 tablet (5 mg total) by mouth 2 (two) times daily. 06/13/17  Yes Glade Lloyd, MD  aspirin 81 MG chewable tablet Place 1 tablet (81 mg total) into feeding tube daily. 06/14/17  Yes Glade Lloyd, MD  atorvastatin (LIPITOR) 20 MG tablet Take 20 mg by mouth at bedtime.   Yes [provider]  finasteride (PROSCAR) 5 MG tablet Take 5 mg by mouth daily.  Yes [provider]  gabapentin (NEURONTIN) 100 MG capsule Take 100 mg by mouth 2 (two) times daily.   Yes [provider]  HYDROcodone-acetaminophen (NORCO) 10-325 MG tablet Take 1 tablet by mouth every 6 (six) hours as needed for moderate pain or severe pain.   Yes [provider]  Multiple Vitamin (MULTIVITAMIN WITH MINERALS) TABS tablet Take 1  tablet by mouth daily. 06/14/17  Yes Glade Lloyd, MD  tamsulosin (FLOMAX) 0.4 MG CAPS capsule Take 1 capsule (0.4 mg total) by mouth daily after supper. 03/26/17  Yes Vassie Loll, MD  thiamine 100 MG tablet Place 1 tablet (100 mg total) into feeding tube daily. 06/14/17  Yes Glade Lloyd, MD  traZODone (DESYREL) 50 MG tablet Take 25 mg by mouth at bedtime.   Yes [provider]  camphor-menthol Wynelle Fanny) lotion Apply topically as needed for itching. Patient not taking: Reported on 06/28/2017 06/13/17   Glade Lloyd, MD  collagenase (SANTYL) ointment Apply topically daily. Patient not taking: Reported on 06/28/2017 06/14/17   Glade Lloyd, MD  feeding supplement, ENSURE ENLIVE, (ENSURE ENLIVE) LIQD Take 237 mLs by mouth 3 (three) times daily between meals. Patient not taking: Reported on 10/24/2017 06/13/17   Glade Lloyd, MD  haloperidol (HALDOL) 1 MG tablet 1mg  in Am and 2mg  at bedtime Patient not taking: Reported on 10/24/2017 06/13/17   Glade Lloyd, MD  Melatonin 3 MG TABS Take 2 tablets (6 mg total) by mouth at bedtime. 06/13/17   Glade Lloyd, MD  Nutritional Supplements (FEEDING SUPPLEMENT, JEVITY 1.5 CAL/FIBER,) LIQD Place 237 mLs into feeding tube 3 (three) times daily with meals. Patient not taking: Reported on 10/24/2017 06/13/17   Glade Lloyd, MD  oxyCODONE (OXY IR/ROXICODONE) 5 MG immediate release tablet Take 1 tablet (5 mg total) by mouth every 6 (six) hours as needed for severe pain (pain related to pressure wound and dressing changes). Patient not taking: Reported on 08/02/2017 06/13/17   Glade Lloyd, MD  Water For Irrigation, Sterile (FREE WATER) SOLN Place 250 mLs into feeding tube every 8 (eight) hours. Patient not taking: Reported on 10/24/2017 06/13/17   Glade Lloyd, MD     Family History  Problem Relation Age of Onset  . Cancer Father     Social History   Socioeconomic History  . Marital status: Divorced    Spouse name: Not on file  . Number of  children: Not on file  . Years of education: Not on file  . Highest education level: Not on file  Occupational History  . Not on file  Social Needs  . Financial resource strain: Not on file  . Food insecurity:    Worry: Not on file    Inability: Not on file  . Transportation needs:    Medical: Not on file    Non-medical: Not on file  Tobacco Use  . Smoking status: Never Smoker  . Smokeless tobacco: Never Used  Substance and Sexual Activity  . Alcohol use: No  . Drug use: No  . Sexual activity: Not on file  Lifestyle  . Physical activity:    Days per week: Not on file    Minutes per session: Not on file  . Stress: Not on file  Relationships  . Social connections:    Talks on phone: Not on file    Gets together: Not on file    Attends religious service: Not on file    Active member of club or organization: Not on file    Attends  meetings of clubs or organizations: Not on file    Relationship status: Not on file  Other Topics Concern  . Not on file  Social History Narrative  . Not on file     Review of Systems: A 12 point ROS discussed and pertinent positives are indicated in the HPI above.  All other systems are negative.  Review of Systems  Vital Signs: BP 117/75   Pulse 70   Temp 97.7 F (36.5 C) (Oral)   Resp 14   SpO2 100%   Physical Exam  Constitutional: He is oriented to person, place, and time. He appears well-developed and well-nourished. No distress.  HENT:  Head: Normocephalic and atraumatic.  Eyes: No scleral icterus.  Cardiovascular: Normal rate and regular rhythm.  Pulmonary/Chest: Effort normal.  Abdominal: Soft. He exhibits no distension. There is no tenderness.  Musculoskeletal:  Bilateral above-the-knee amputations.  Amputation sites are well-healed.  No evidence of lower extremity edema, skin ulcerations or wounds.  Neurological: He is alert and oriented to person, place, and time.  Skin: Skin is warm and dry.  Psychiatric: He has a  normal mood and affect. His behavior is normal.  Nursing note and vitals reviewed.    Imaging: Ir Radiologist Eval & Mgmt  Result Date: 10/24/2017 Please refer to notes tab for details about interventional procedure. (Op Note)   Labs:  CBC: Recent Labs    06/10/17 0816 06/11/17 0309 06/12/17 0238 06/13/17 0359  WBC 14.7* 16.7* 15.4* 17.0*  HGB 9.8* 9.8* 9.7* 10.5*  HCT 30.1* 30.6* 29.7* 32.8*  PLT 292 475* 415* 407*    COAGS: Recent Labs    05/10/17 0650  06/11/17 1431 06/12/17 0238 06/12/17 1602 06/13/17 0359  INR 1.21  --   --   --   --   --   APTT  --    < > 65* 48* 69* 61*   < > = values in this interval not displayed.    BMP: Recent Labs    06/05/17 0331 06/08/17 0826 06/10/17 0816 06/13/17 0359  NA 134* 131* 132* 134*  K 4.7 4.9 5.3* 5.2*  CL 100* 97* 97* 95*  CO2 26 25 22 27   GLUCOSE 144* 141* 145* 125*  BUN 23* 27* 28* 25*  CALCIUM 8.4* 8.3* 8.4* 9.3  CREATININE 0.97 1.18 1.09 1.08  GFRNONAA >60 >60 >60 >60  GFRAA >60 >60 >60 >60    LIVER FUNCTION TESTS: Recent Labs    05/06/17 0516  05/07/17 0334  05/10/17 0814  05/27/17 0202 05/28/17 0228 05/29/17 0250 06/08/17 0826  BILITOT 0.7  --  0.6  --  0.4  --   --   --   --  0.6  AST 239*  --  177*  --  68*  --   --   --   --  33  ALT 378*  --  326*  --  120*  --   --   --   --  70*  ALKPHOS 109  --  115  --  103  --   --   --   --  290*  PROT 6.1*  --  6.9  --  6.1*  --   --   --   --  7.2  ALBUMIN 1.7*  1.7*   < > 1.7*  1.6*   < > 1.4*   < > 1.5* 1.7* 1.5* 2.4*   < > = values in this interval not displayed.    TUMOR  MARKERS: No results for input(s): AFPTM, CEA, CA199, CHROMGRNA in the last 8760 hours.  Assessment and Plan:  Significantly improved relative to his prior hospital admission.  He has undergone bilateral above-the-knee amputation but has otherwise recovered quite well.  He is currently on anticoagulation with Eliquis.  He is currently living in a group home.   Unfortunately, he remains concerned about his social and health situation in the future.  Ideally, he would like to get his own place and become independent once again.  Currently, he would be a candidate for IVC filter retrieval given that he has resumed anticoagulation and is largely recovered from his multiple medical problems.  However, given his overall lack of ambulation and unstable living situation, there is a risk that he would experience a period of time without access to health care or his medications and that in that situation, having an IVC filter in place may be protective if he were to develop a recurrent DVT.  We discussed the risks, benefits and alternatives to filter retrieval at this time.  Mr. Swaim would like to continue to evaluate his social/living situation.  I will have him return to see me in 3 months time.  If his situation remains stable, we will proceed with a bilateral lower extremity DVT study to exclude any residual DVT prior to scheduling him for filter retrieval.  1.) return to clinic in 3 months for reevaluation and bilateral lower extremity duplex venous ultrasound   Electronically Signed: Torri Langston 10/24/2017, 1:45 PM   I spent a total of  15 Minutes in face to face in clinical consultation, greater than 50% of which was counseling/coordinating care for presence of IVC filter.

## 2018-01-09 ENCOUNTER — Other Ambulatory Visit (HOSPITAL_COMMUNITY): Payer: Self-pay | Admitting: Interventional Radiology

## 2018-01-09 DIAGNOSIS — Z95828 Presence of other vascular implants and grafts: Secondary | ICD-10-CM

## 2018-02-06 ENCOUNTER — Encounter: Payer: Self-pay | Admitting: Radiology

## 2018-02-06 ENCOUNTER — Ambulatory Visit
Admission: RE | Admit: 2018-02-06 | Discharge: 2018-02-06 | Disposition: A | Discharge status: Home / Self Care | Payer: Medicare Other | Source: Ambulatory Visit | Attending: Interventional Radiology | Admitting: Interventional Radiology

## 2018-02-06 DIAGNOSIS — Z95828 Presence of other vascular implants and grafts: Secondary | ICD-10-CM

## 2018-02-06 HISTORY — PX: IR RADIOLOGIST EVAL & MGMT: IMG5224

## 2018-02-06 NOTE — Progress Notes (Signed)
Chief Complaint: Patient was seen in consultation today for  Chief Complaint  Patient presents with  . Follow-up   at the request of Niajah Sipos  Referring Physician(s): Priyansh Pry  History of Present Illness: Joseph Downs is a 67 y.o. male  initially seen by me as an inpatient at [facility] in February 2019.  At that time, he was homeless and found down in the elements and was admitted with acute provoked bilateral lower extremity DVT, intermediate to high risk large-volume pulmonary embolism, acute renal failure requiring CRRT, and bilateral lower extremity frostbite.  Due to his significant medical comorbidities and tenuous cardiopulmonary status, dual prophylaxis with IVC filter placement and IV heparin was warranted.  Additionally, the patient subsequently underwent multiple surgeries during which time the anticoagulation was held and the IVC filter was protective.  Ultimately, he underwent bilateral above-the-knee amputations.  He has subsequently healed, recovered and been discharged.   He is currently in a state of relatively good health.  He denies lower extremity swelling, chest pain or shortness of breath.  He is compliant with his medicines while in the group home and reports that he is doing much better.  We also discussed the risks and benefits of leaving the filter in place for continued protection versus removing it if it is no longer required.  He voiced his understanding and I was able to answer all of his questions.  He would like to have the filter retrieved.  Past Medical History:  Diagnosis Date  . Acidosis   . Acute embolism and thrombosis of deep vein of both lower extremities (HCC)   . Acute respiratory failure (HCC)   . Hyperlipemia   . Hyperosmolality and/or hypernatremia   . Hypotension, unspecified   . PVD (peripheral vascular disease) (HCC)     Past Surgical History:  Procedure Laterality Date  . AMPUTATION Bilateral  05/24/2017   Procedure: Bilateral AMPUTATION ABOVE KNEE;  Surgeon: Fransisco Hertz, MD;  Location: [facility] OR;  Service: Vascular;  Laterality: Bilateral;  . ESOPHAGOGASTRODUODENOSCOPY N/A 05/16/2017   Procedure: ESOPHAGOGASTRODUODENOSCOPY (EGD);  Surgeon: Violeta Gelinas, MD;  Location: [facility] ENDOSCOPY;  Service: Endoscopy;  Laterality: N/A;  . IR IVC FILTER PLMT / S&I Lenise Arena GUID/MOD SED  05/10/2017  . IR RADIOLOGIST EVAL & MGMT  10/24/2017  . IR RADIOLOGIST EVAL & MGMT  02/06/2018  . PEG PLACEMENT N/A 05/16/2017   Procedure: PERCUTANEOUS ENDOSCOPIC GASTROSTOMY (PEG) REPLACEMENT;  Surgeon: Violeta Gelinas, MD;  Location: [facility] ENDOSCOPY;  Service: Endoscopy;  Laterality: N/A;    Allergies: Patient has no known allergies.  Medications: Prior to Admission medications   Medication Sig Start Date End Date Taking? Authorizing Provider  apixaban (ELIQUIS) 5 MG TABS tablet Take 1 tablet (5 mg total) by mouth 2 (two) times daily. 06/13/17  Yes Glade Lloyd, MD  aspirin 81 MG chewable tablet Place 1 tablet (81 mg total) into feeding tube daily. 06/14/17  Yes Glade Lloyd, MD  atorvastatin (LIPITOR) 20 MG tablet Take 20 mg by mouth at bedtime.   Yes [provider]  finasteride (PROSCAR) 5 MG tablet Take 5 mg by mouth daily.   Yes [provider]  gabapentin (NEURONTIN) 100 MG capsule Take 100 mg by mouth 2 (two) times daily.   Yes [provider]  Multiple Vitamin (MULTIVITAMIN WITH MINERALS) TABS tablet Take 1 tablet by mouth daily. 06/14/17  Yes Glade Lloyd, MD  tamsulosin (FLOMAX) 0.4 MG CAPS capsule Take 1 capsule (0.4 mg total) by mouth daily  after supper. 03/26/17  Yes Vassie Loll, MD  traZODone (DESYREL) 50 MG tablet Take 25 mg by mouth at bedtime.   Yes [provider]  Melatonin 3 MG TABS Take 2 tablets (6 mg total) by mouth at bedtime. Patient not taking: Reported on 02/06/2018 06/13/17   Glade Lloyd, MD     Family History  Problem Relation Age of Onset  .  Cancer Father     Social History   Socioeconomic History  . Marital status: Divorced    Spouse name: Not on file  . Number of children: Not on file  . Years of education: Not on file  . Highest education level: Not on file  Occupational History  . Not on file  Social Needs  . Financial resource strain: Not on file  . Food insecurity:    Worry: Not on file    Inability: Not on file  . Transportation needs:    Medical: Not on file    Non-medical: Not on file  Tobacco Use  . Smoking status: Never Smoker  . Smokeless tobacco: Never Used  Substance and Sexual Activity  . Alcohol use: No  . Drug use: No  . Sexual activity: Not on file  Lifestyle  . Physical activity:    Days per week: Not on file    Minutes per session: Not on file  . Stress: Not on file  Relationships  . Social connections:    Talks on phone: Not on file    Gets together: Not on file    Attends religious service: Not on file    Active member of club or organization: Not on file    Attends meetings of clubs or organizations: Not on file    Relationship status: Not on file  Other Topics Concern  . Not on file  Social History Narrative  . Not on file    Review of Systems: A 12 point ROS discussed and pertinent positives are indicated in the HPI above.  All other systems are negative.  Review of Systems  Vital Signs: BP (!) 142/62   Pulse 70   Resp 15   Wt 72.6 kg   SpO2 98%   BMI 22.96 kg/m   Physical Exam  Constitutional: He is oriented to person, place, and time. He appears well-developed and well-nourished. No distress.  HENT:  Head: Normocephalic and atraumatic.  Eyes: No scleral icterus.  Cardiovascular: Normal rate.  Pulmonary/Chest: Effort normal.  Neurological: He is alert and oriented to person, place, and time.  Skin: Skin is warm and dry.  Psychiatric: He has a normal mood and affect. His behavior is normal.  Nursing note and vitals reviewed.    Imaging: US Venous Img  Lower Bilateral  Result Date: 02/06/2018 CLINICAL DATA:  67 year old male with a history of bilateral DVT and pulmonary emboli who is critically ill and required dual PE prophylaxis with both intravenous heparin and IVC filter placement. He has subsequently recovered and has resumed anticoagulation. He is ready for filter retrieval. Bilateral lower extremity duplex venous evaluation to ensure there is no evidence of acute or significant residual DVT. EXAM: BILATERAL LOWER EXTREMITY VENOUS DOPPLER ULTRASOUND TECHNIQUE: Gray-scale sonography with graded compression, as well as color Doppler and duplex ultrasound were performed to evaluate the lower extremity deep venous systems from the level of the common femoral vein and including the common femoral, femoral, profunda femoral, popliteal and calf veins including the posterior tibial, peroneal and gastrocnemius veins when visible. The superficial great  saphenous vein was also interrogated. Spectral Doppler was utilized to evaluate flow at rest and with distal augmentation maneuvers in the common femoral, femoral and popliteal veins. COMPARISON:  None. FINDINGS: RIGHT LOWER EXTREMITY Common Femoral Vein: No evidence of thrombus. Normal compressibility, respiratory phasicity and response to augmentation. Saphenofemoral Junction: No evidence of thrombus. Normal compressibility and flow on color Doppler imaging. Profunda Femoral Vein: No evidence of thrombus. Normal compressibility and flow on color Doppler imaging. Femoral Vein: No evidence of thrombus. Normal compressibility, respiratory phasicity and response to augmentation. Popliteal Vein: Above the knee amputation Calf Veins: Above the knee amputation Superficial Great Saphenous Vein: No evidence of thrombus. Normal compressibility. Venous Reflux:  None. Other Findings:  None. LEFT LOWER EXTREMITY Common Femoral Vein: No evidence of thrombus. Normal compressibility, respiratory phasicity and response to  augmentation. Saphenofemoral Junction: No evidence of thrombus. Normal compressibility and flow on color Doppler imaging. Profunda Femoral Vein: No evidence of thrombus. Normal compressibility and flow on color Doppler imaging. Femoral Vein: No evidence of thrombus. Normal compressibility, respiratory phasicity and response to augmentation. The vein is duplicated in the mid thigh. Both branches are patent. Popliteal Vein: Above the knee amputation Calf Veins: Above the knee amputation Superficial Great Saphenous Vein: No evidence of thrombus. Normal compressibility. Venous Reflux:  None. Other Findings:  None. IMPRESSION: No evidence of deep venous thrombosis. Electronically Signed   By: Malachy MoanHeath  Lillyen Schow M.D.   On: 02/06/2018 10:37   Ir Radiologist Eval & Mgmt  Result Date: 02/06/2018 Please refer to notes tab for details about interventional procedure. (Op Note)   Labs:  CBC: Recent Labs    06/10/17 0816 06/11/17 0309 06/12/17 0238 06/13/17 0359  WBC 14.7* 16.7* 15.4* 17.0*  HGB 9.8* 9.8* 9.7* 10.5*  HCT 30.1* 30.6* 29.7* 32.8*  PLT 292 475* 415* 407*    COAGS: Recent Labs    05/10/17 0650  06/11/17 1431 06/12/17 0238 06/12/17 1602 06/13/17 0359  INR 1.21  --   --   --   --   --   APTT  --    < > 65* 48* 69* 61*   < > = values in this interval not displayed.    BMP: Recent Labs    06/05/17 0331 06/08/17 0826 06/10/17 0816 06/13/17 0359  NA 134* 131* 132* 134*  K 4.7 4.9 5.3* 5.2*  CL 100* 97* 97* 95*  CO2 26 25 22 27   GLUCOSE 144* 141* 145* 125*  BUN 23* 27* 28* 25*  CALCIUM 8.4* 8.3* 8.4* 9.3  CREATININE 0.97 1.18 1.09 1.08  GFRNONAA >60 >60 >60 >60  GFRAA >60 >60 >60 >60    LIVER FUNCTION TESTS: Recent Labs    05/06/17 0516  05/07/17 0334  05/10/17 0814  05/27/17 0202 05/28/17 0228 05/29/17 0250 06/08/17 0826  BILITOT 0.7  --  0.6  --  0.4  --   --   --   --  0.6  AST 239*  --  177*  --  68*  --   --   --   --  33  ALT 378*  --  326*  --  120*   --   --   --   --  70*  ALKPHOS 109  --  115  --  103  --   --   --   --  290*  PROT 6.1*  --  6.9  --  6.1*  --   --   --   --  7.2  ALBUMIN 1.7*  1.7*   < > 1.7*  1.6*   < > 1.4*   < > 1.5* 1.7* 1.5* 2.4*   < > = values in this interval not displayed.    TUMOR MARKERS: No results for input(s): AFPTM, CEA, CA199, CHROMGRNA in the last 8760 hours.  Assessment and Plan:  67 year old male with an IVC filter that was placed for dual PE prophylaxis in the setting of critical illness and submassive PE with right heart strain.  He has subsequently recovered and his social and health status are both much improved.  He has back on anticoagulation and ready to have his filter retrieved.  No evidence of residual DVT on his bilateral lower extremity Doppler ultrasound today.  1.)  Please schedule for IVC filter retrieval by Dr. Archer Asa at either [facility] or [facility].   Electronically Signed: Malachy Moan 02/06/2018, 12:05 PM   I spent a total of  15 Minutes in face to face in clinical consultation, greater than 50% of which was counseling/coordinating care for IVC filter in place

## 2018-02-19 ENCOUNTER — Other Ambulatory Visit (HOSPITAL_COMMUNITY): Payer: Self-pay | Admitting: Interventional Radiology

## 2018-02-19 ENCOUNTER — Telehealth (HOSPITAL_COMMUNITY): Payer: Self-pay

## 2018-02-19 DIAGNOSIS — Z95828 Presence of other vascular implants and grafts: Secondary | ICD-10-CM

## 2018-02-19 NOTE — Telephone Encounter (Signed)
Called to schedule ivc filter removal, no answer, no vm. AW

## 2018-02-19 NOTE — Telephone Encounter (Signed)
Called to schedule ivc filter removal, no answer, no vm. AW 

## 2018-02-25 ENCOUNTER — Telehealth (HOSPITAL_COMMUNITY): Payer: Self-pay

## 2018-02-25 NOTE — Telephone Encounter (Signed)
Called to schedule ivc filter retrieval, no answer, no vm. AW  

## 2019-06-20 IMAGING — DX DG CHEST 1V PORT
1 series · 1 of 1 positions shown · non-contrast
Comparison: PA and lateral chest x-ray July 08, 2015

CLINICAL DATA: Altered mental status, hypertension, tachypnea.

EXAM:
PORTABLE CHEST 1 VIEW

[chest ap]
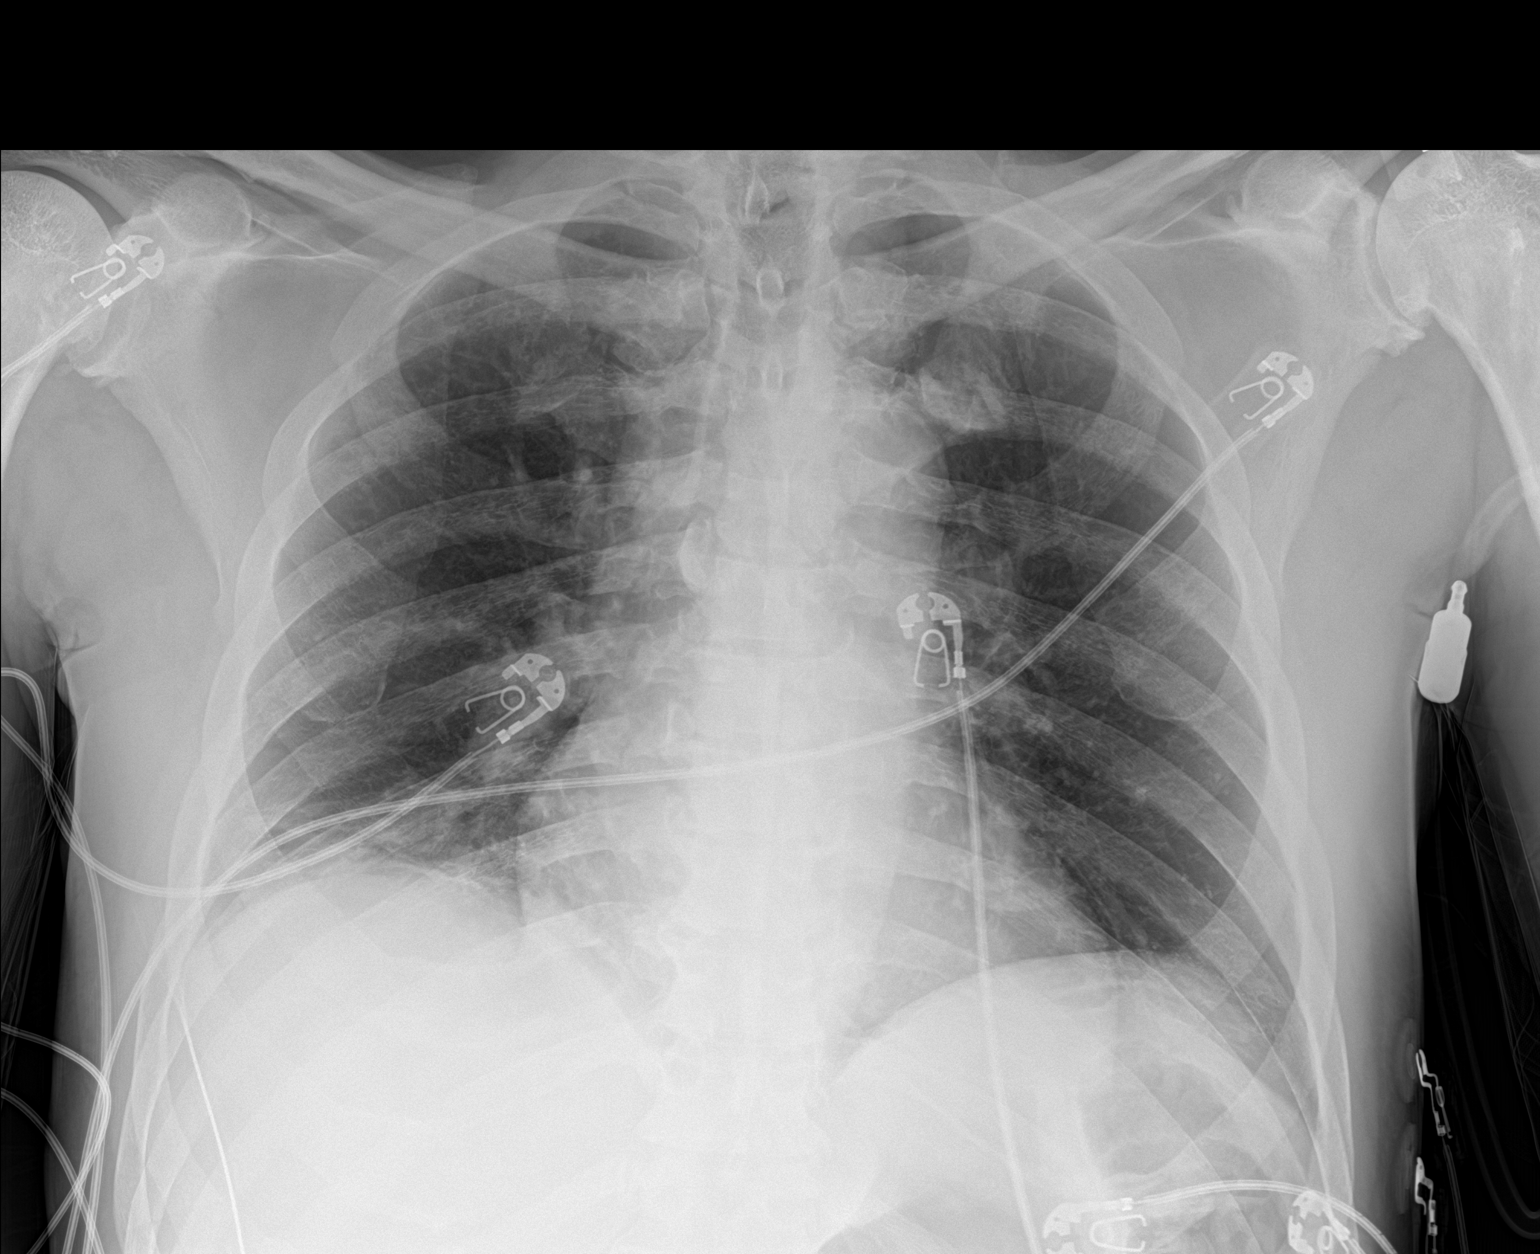

[1 of 1 positions shown; findings below may reference images not displayed]

FINDINGS: The lungs are mildly hypoinflated but clear. The heart and pulmonary
vascularity are normal. The mediastinum is normal in width. There is
no pleural effusion. The bony thorax is unremarkable.
IMPRESSION: Mild hypoinflation.  No acute cardiopulmonary abnormality.

## 2019-06-24 IMAGING — DX DG CHEST 1V PORT
1 series · 1 of 1 positions shown · non-contrast
Comparison: 05/04/2017

CLINICAL DATA: Acute respiratory failure.

EXAM:
PORTABLE CHEST 1 VIEW

[chest ap]
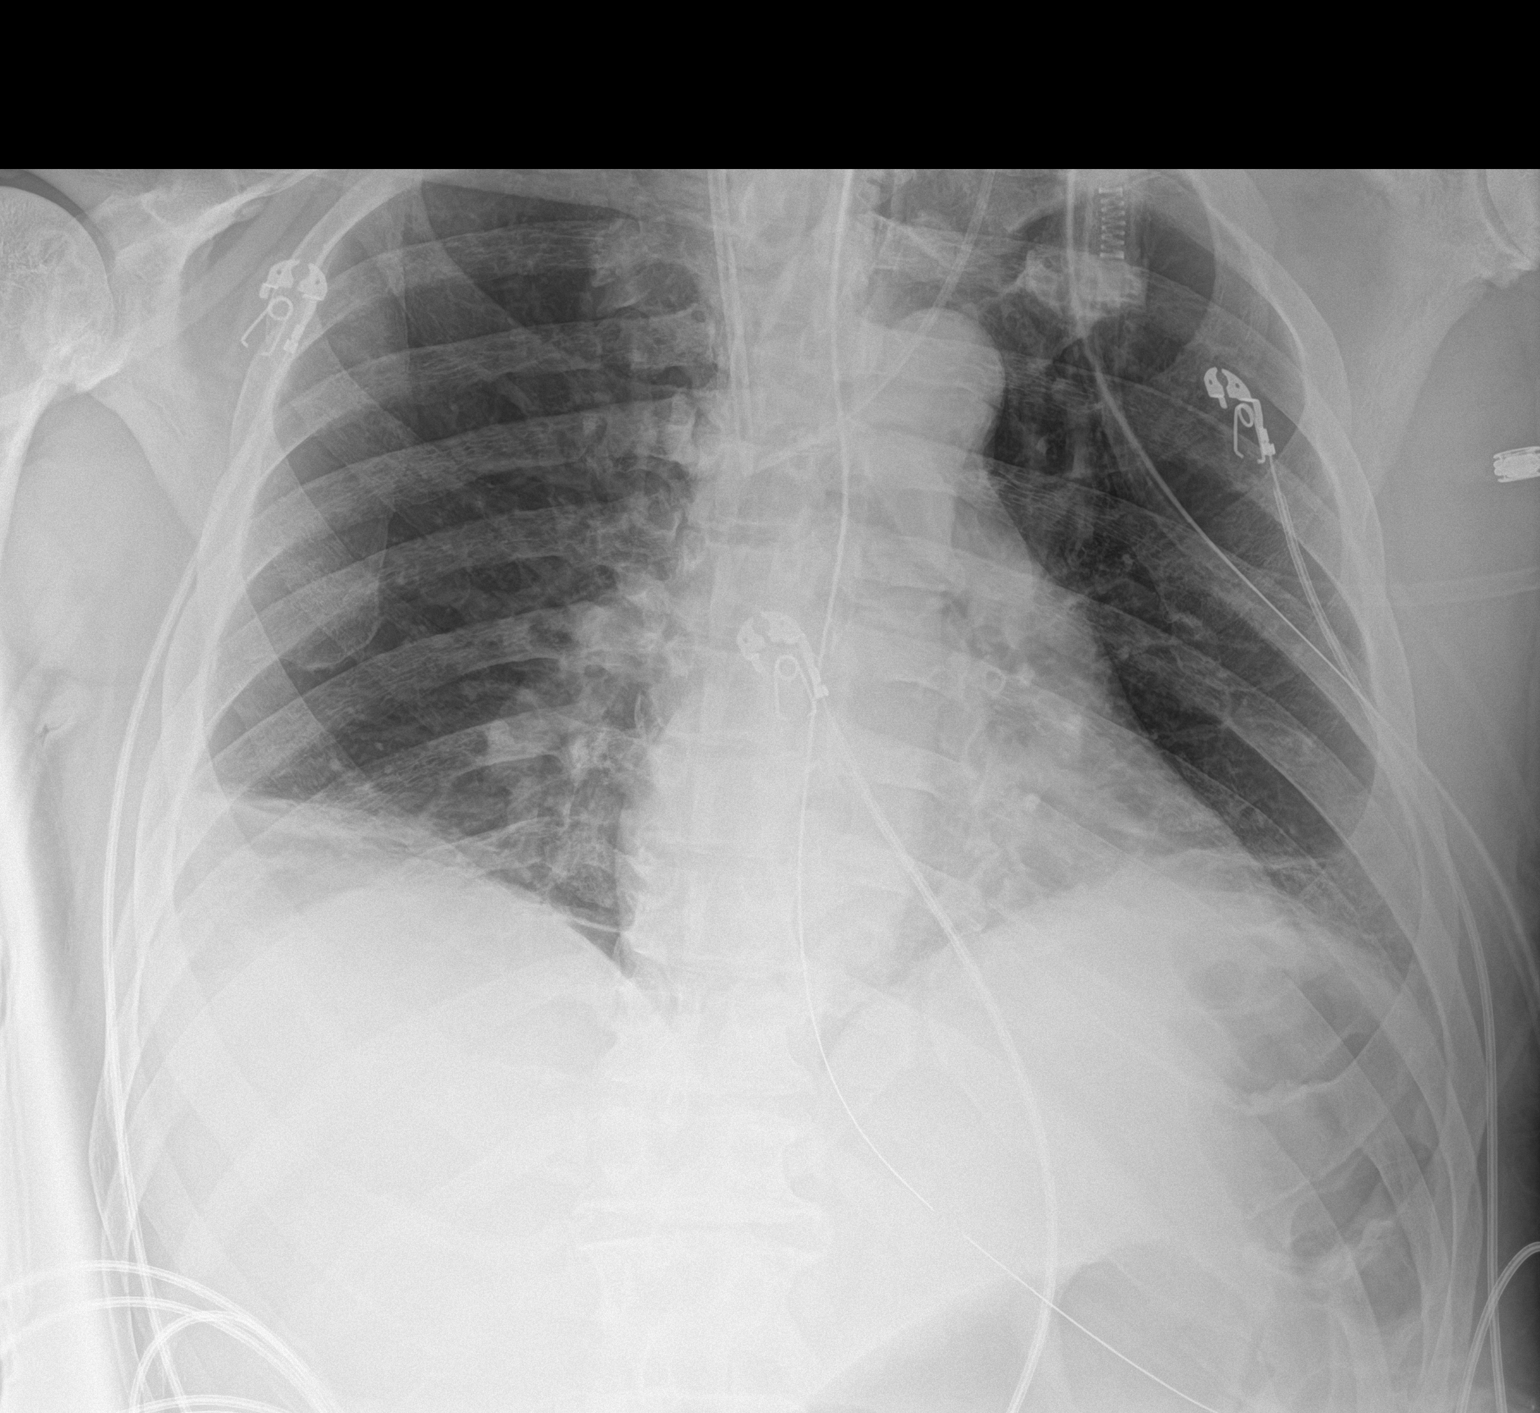

[1 of 1 positions shown; findings below may reference images not displayed]

FINDINGS: 8254 hours. Rotated film. Endotracheal tube not well seen but may
still be in place. The NG tube passes into the stomach although the
distal tip position is not included on the film. Right IJ central
line tip overlies the region of the innominate vein confluence. Left
IJ central line tip is also positioned over the innominate vein
confluence. Bibasilar atelectasis. Heart size within normal limits.
Telemetry leads overlie the chest.
IMPRESSION: Stable exam with bibasilar atelectasis.

## 2019-06-25 IMAGING — DX DG CHEST 1V PORT
1 series · 1 of 1 positions shown · non-contrast
Comparison: Chest x-ray 05/05/2017.

CLINICAL DATA: 66-year-old male with history of acute respiratory
failure.

EXAM:
PORTABLE CHEST 1 VIEW

[chest ap]
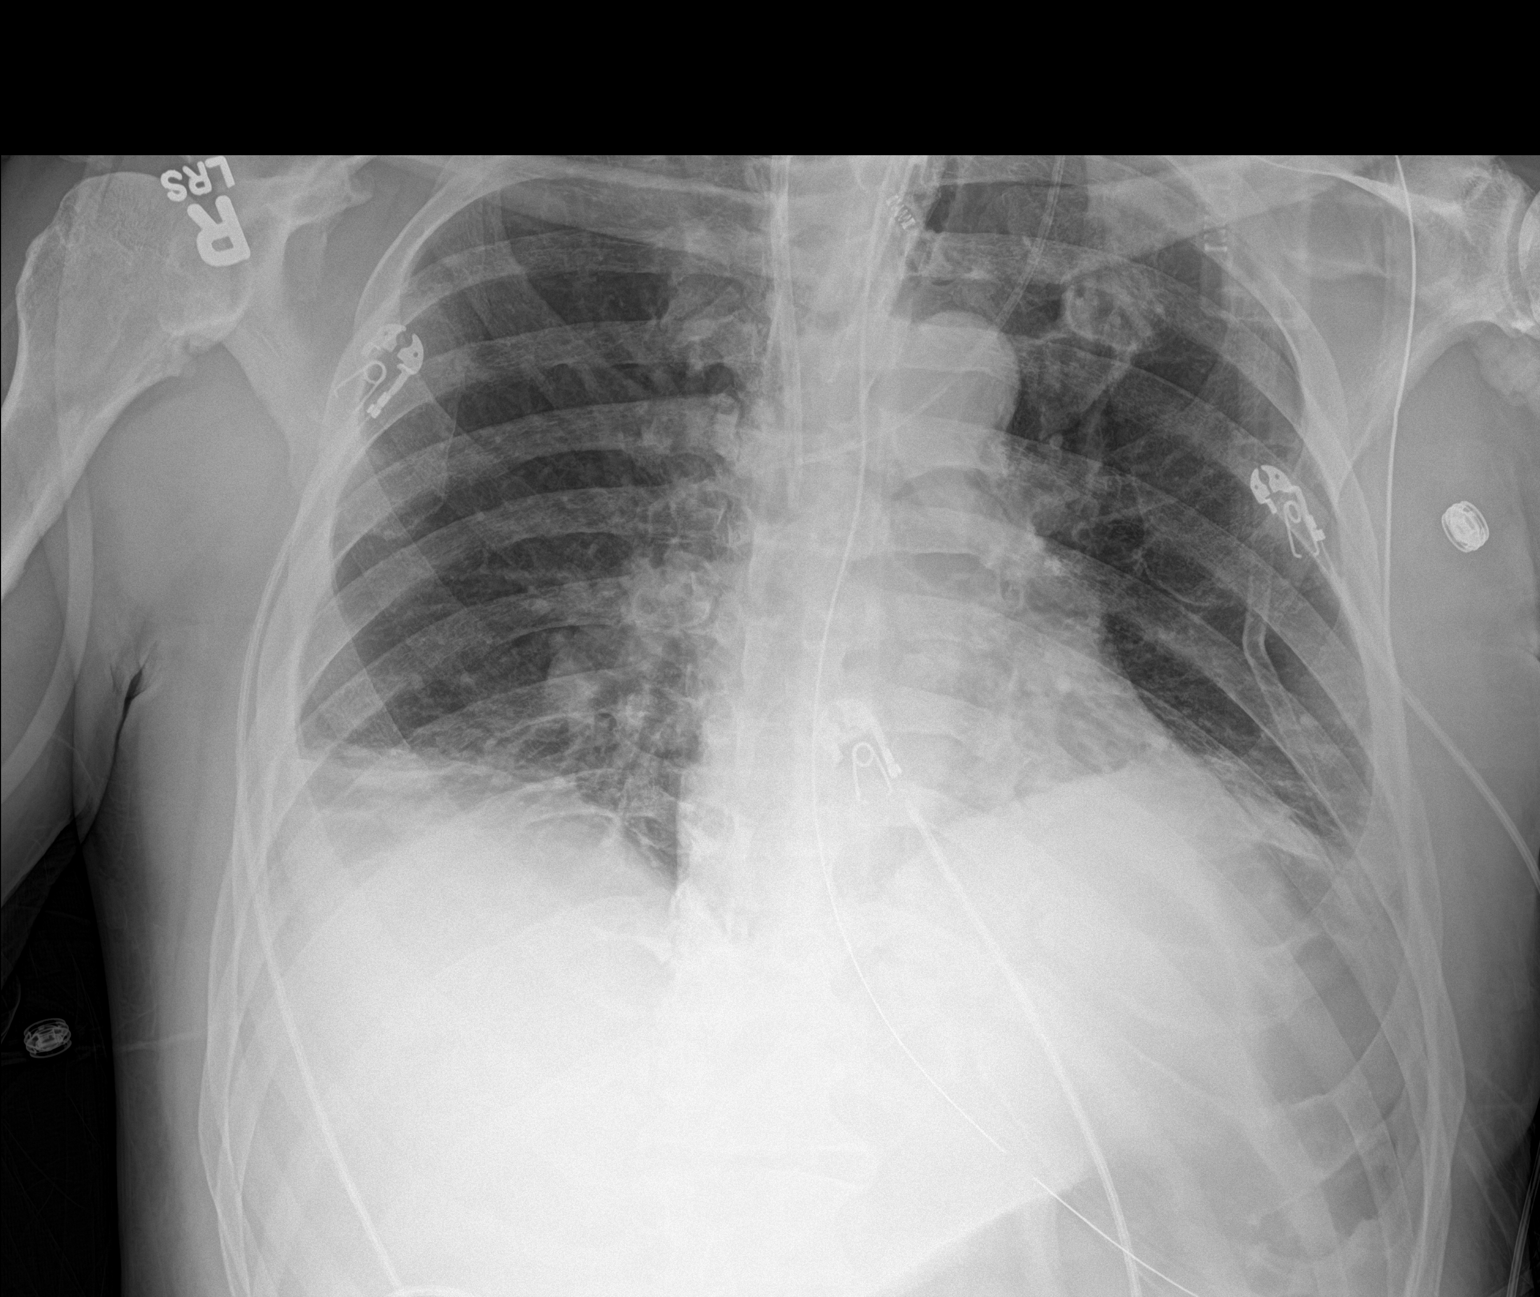

[1 of 1 positions shown; findings below may reference images not displayed]

FINDINGS: An endotracheal tube is in place with tip 2.4 cm above the carina.
There is a right-sided internal jugular central venous catheter with
tip terminating in the distal superior vena cava. There is a
left-sided internal jugular central venous catheter with tip
terminating in the mid superior vena cava. A nasogastric tube is
seen extending into the stomach, however, the tip of the nasogastric
tube extends below the lower margin of the image. Lung volumes are
low. Bibasilar opacities are favored to reflect areas of
subsegmental atelectasis. Probable trace bilateral pleural
effusions. No definite consolidative airspace disease. No
pneumothorax. No evidence of pulmonary edema. Heart size is normal.
Upper mediastinal contours are within normal limits.
IMPRESSION: 1. Support apparatus, as above.
2. Low lung volumes with bibasilar subsegmental atelectasis and
probable trace bilateral pleural effusions.

## 2019-06-29 IMAGING — XA IR IVC FILTER PLMT / S&I /IMG GUID/MOD SED
2 series · 13 of 24 positions shown · IV contrast (CARBON DIOXIDE)
Comparison: none

INDICATION: 66-year-old homeless male found down in the elements. Currently
admitted with numerous medical problems including bilateral lower
extremity DVT, presumed intermediate or high risk pulmonary
embolism, acute renal failure requiring CRRT, bilateral lower
extremity frost bite. Due to his significant underlying medical
comorbidities and tenuous cardial respiratory status, dual
prophylaxis with IVC filter +intravenous heparin is warranted.
Additionally, the patient may require multiple medical procedures
during this admission requiring temporary cessation of
anticoagulation. The filter will provide PE prophylaxis during those
times. Finally, there is a so celiac innominate concern that the
patient will not be compliant with antibiotic therapy if he
recovers. If he is able to recover and regain ambulation and be
discharged on anticoagulation, the filter may be retrievable in the
future. CO2 venography will be performed to minimize further damage
to the kidneys.

[Series 1: co2 evenflow · 9 acquisitions, 5 frames shown (1 of 2)]
[im 1/9]
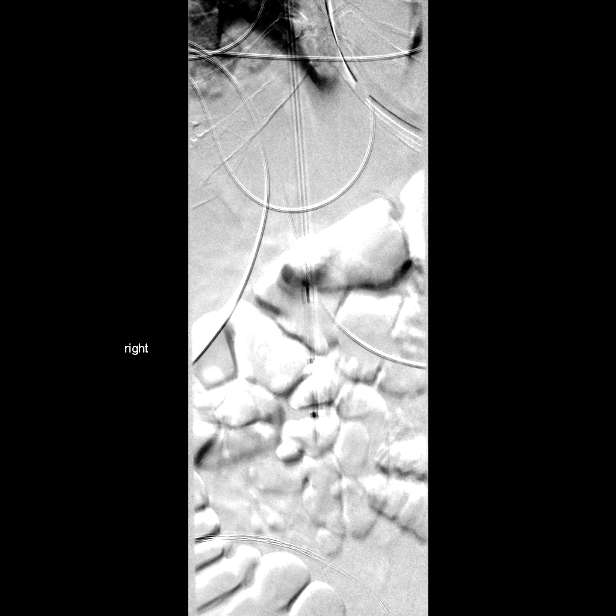
[im 1/9]
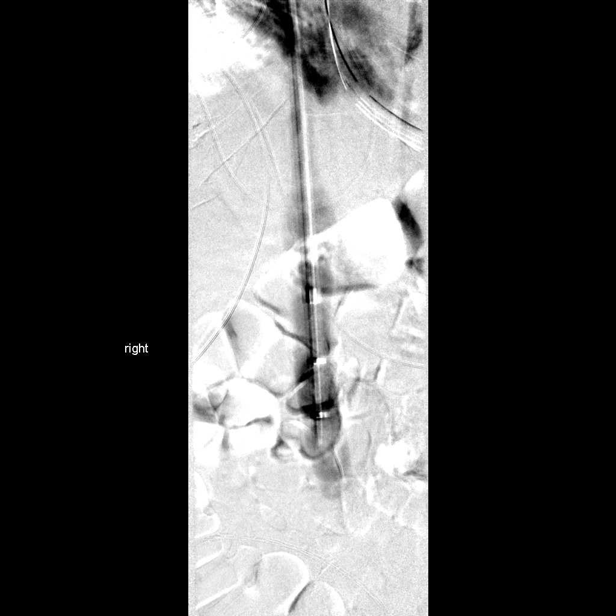
[im 3/9]
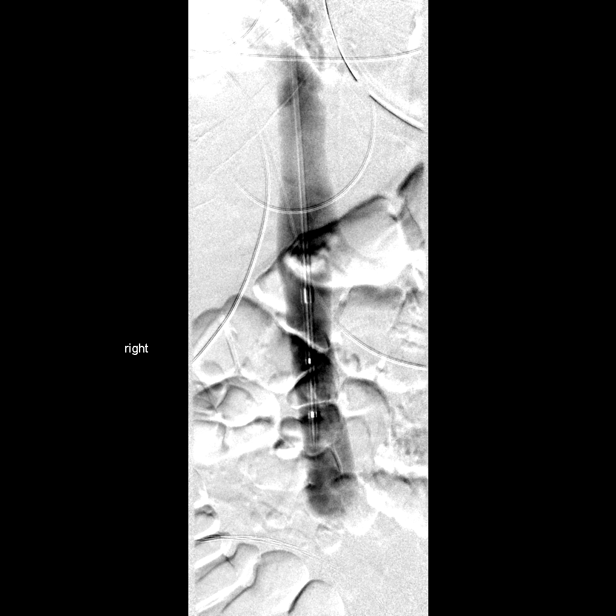
[im 5/9]
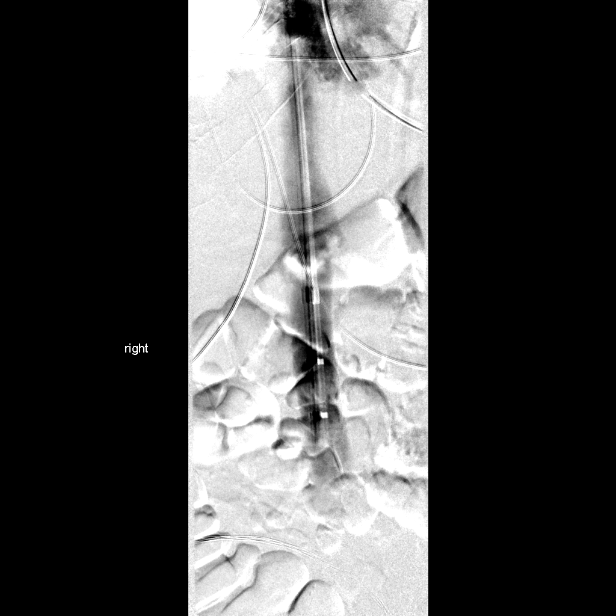
[im 7/9]
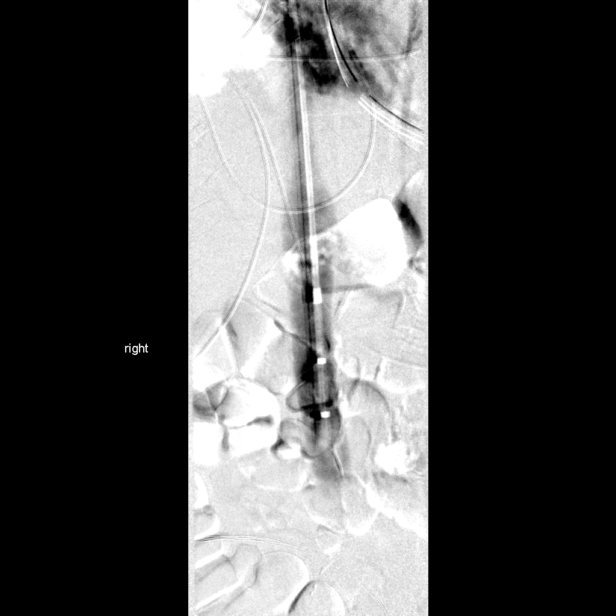

[Series 2: co2 evenflow · 13 acquisitions, 8 frames shown (2 of 2)]
[im 1/13]
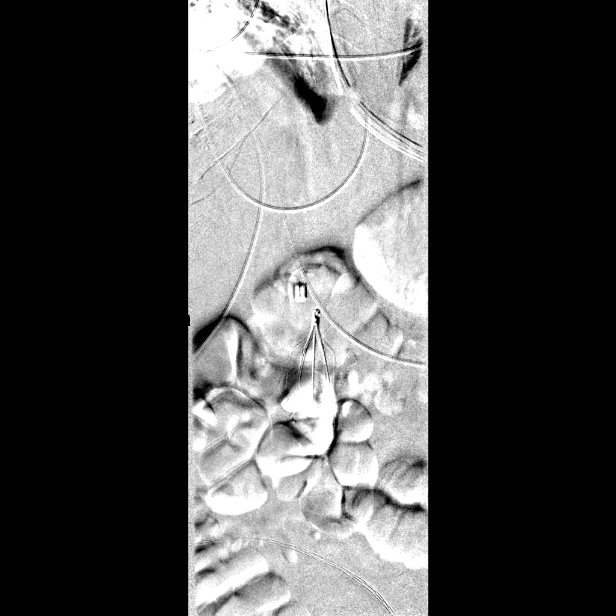
[im 1/13]
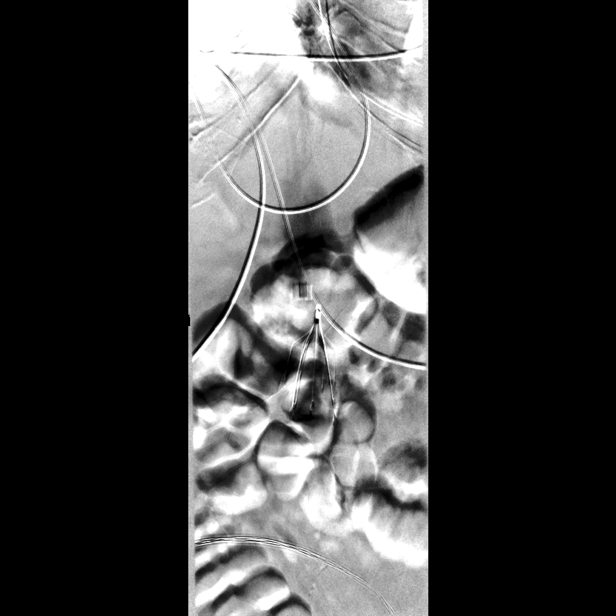
[im 1/13]
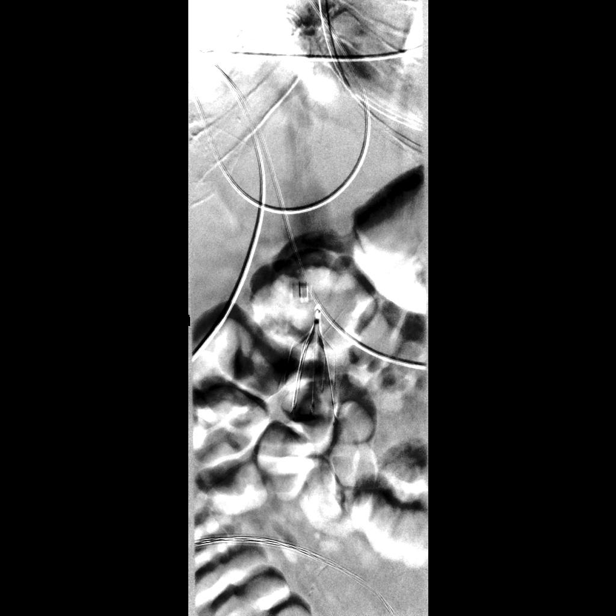
[im 4/13]
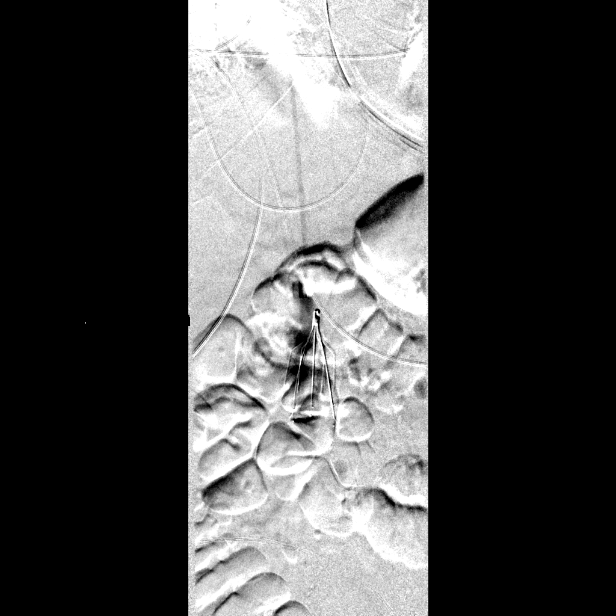
[im 6/13]
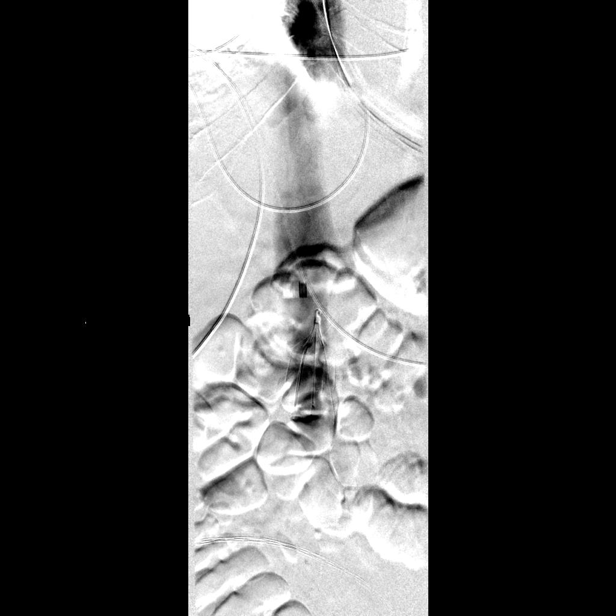
[im 8/13]
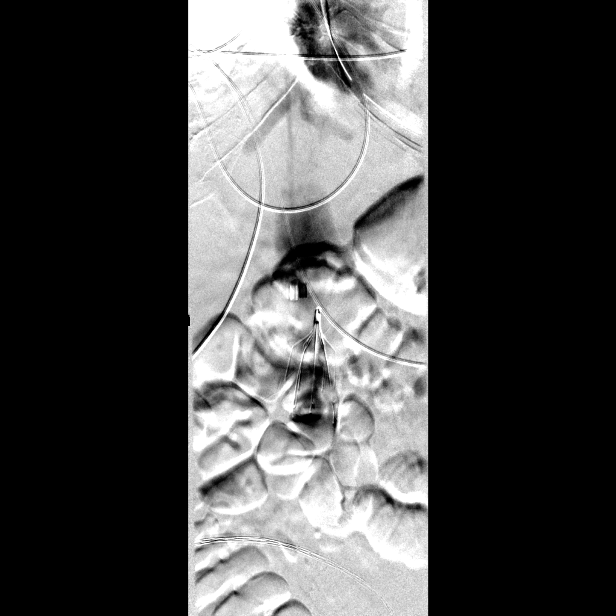
[im 11/13]
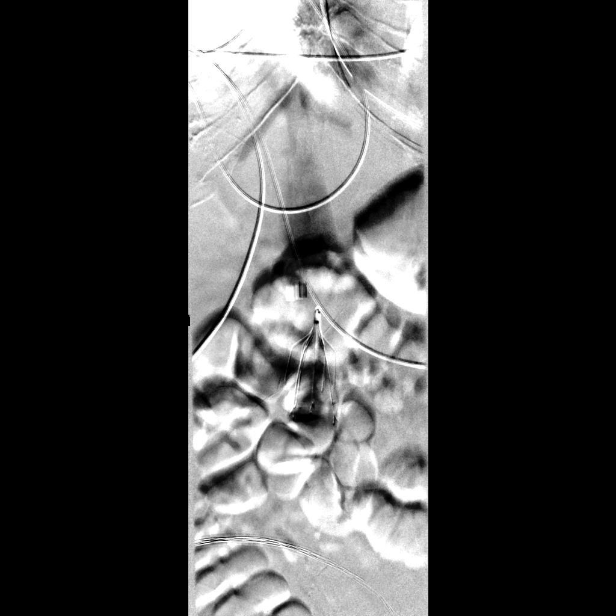
[im 13/13]
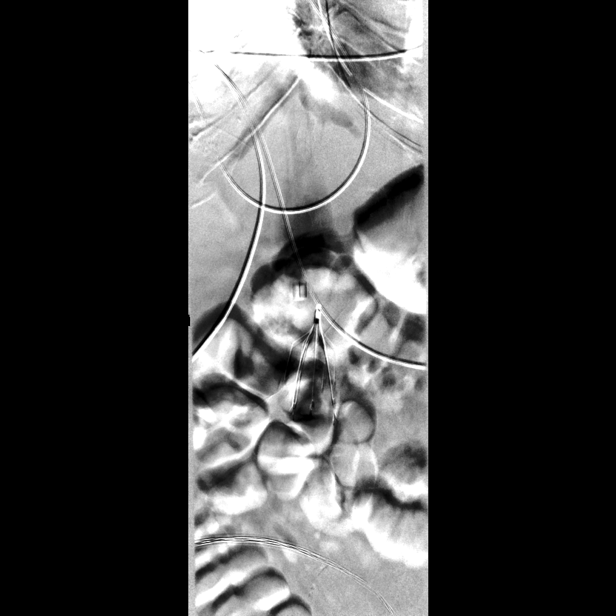

[13 of 24 positions shown; findings below may reference images not displayed]

EXAM:
ULTRASOUND GUIDANCE FOR VASCULARACCESS

IVC CATHETERIZATION AND VENOGRAM

IVC FILTER INSERTION

MEDICATIONS:
None.

ANESTHESIA/SEDATION:
None

FLUOROSCOPY TIME:  Fluoroscopy Time: 0 minutes 36 seconds (64 mGy).

COMPLICATIONS:
None immediate.

PROCEDURE:
Informed written consent was obtained from the patient after a
thorough discussion of the procedural risks, benefits and
alternatives. All questions were addressed. Maximal Sterile Barrier
Technique was utilized including caps, mask, sterile gowns, sterile
gloves, sterile drape, hand hygiene and skin antiseptic. A timeout
was performed prior to the initiation of the procedure.

Maximal barrier sterile technique utilized including caps, mask,
sterile gowns, sterile gloves, large sterile drape, hand hygiene,
and Betadine prep.

Under sterile condition and local anesthesia, right internal jugular
venous access was performed with ultrasound. An ultrasound image was
saved and sent to PACS. Over a guidewire, the IVC filter delivery
sheath and inner dilator were advanced into the IVC just above the
IVC bifurcation. Carbon dioxide gas injection was performed for an
IVC venogram.

Through the delivery sheath, a retrievable Denali IVC filter was
deployed below the level of the renal veins and above the IVC
bifurcation. Limited post deployment venacavagram was performed.

The delivery sheath was removed and hemostasis was obtained with
manual compression. A dressing was placed. The patient tolerated the
procedure well without immediate post procedural complication.
FINDINGS: The IVC is patent. No evidence of thrombus, stenosis, or occlusion.
No variant venous anatomy. Successful placement of the IVC filter
below the level of the renal veins.
IMPRESSION: Successful ultrasound and fluoroscopically guided placement of an
infrarenal retrievable IVC filter via right jugular approach.

PLAN:
This IVC filter is potentially retrievable. The patient will be
approximately 8-12 weeks. Further recommendations regarding filter
retrieval, continued surveillance or declaration of device
permanence, will be made at that time.

## 2022-12-05 ENCOUNTER — Other Ambulatory Visit: Payer: Self-pay | Admitting: Gastroenterology

## 2022-12-19 ENCOUNTER — Encounter (HOSPITAL_COMMUNITY): Payer: Self-pay

## 2022-12-19 ENCOUNTER — Ambulatory Visit (HOSPITAL_COMMUNITY): Admit: 2022-12-19 | Payer: Medicare (Managed Care) | Admitting: Gastroenterology

## 2022-12-19 SURGERY — COLONOSCOPY
Anesthesia: Monitor Anesthesia Care
